# Patient Record
Sex: Female | Born: 1970 | Race: White | Hispanic: No | Marital: Married | Smoking: Former smoker
Health system: Southern US, Community
[De-identification: ages and names within clinical notes are randomized; demographics above are authoritative.]

## PROBLEM LIST (undated history)

## (undated) DIAGNOSIS — L0390 Cellulitis, unspecified: Secondary | ICD-10-CM

## (undated) DIAGNOSIS — K219 Gastro-esophageal reflux disease without esophagitis: Secondary | ICD-10-CM

## (undated) HISTORY — PX: TUBAL LIGATION: SHX77

## (undated) HISTORY — PX: ROTATOR CUFF REPAIR: SHX139

## (undated) HISTORY — PX: KNEE ARTHROSCOPY: SHX127

## (undated) HISTORY — PX: CHOLECYSTECTOMY: SHX55

## (undated) HISTORY — PX: ABLATION: SHX5711

---

## 1998-03-09 ENCOUNTER — Ambulatory Visit (HOSPITAL_COMMUNITY): Admission: RE | Admit: 1998-03-09 | Discharge: 1998-03-09 | Payer: Self-pay | Admitting: Family Medicine

## 1998-03-09 ENCOUNTER — Encounter: Payer: Self-pay | Admitting: Family Medicine

## 1998-06-27 ENCOUNTER — Emergency Department (HOSPITAL_COMMUNITY): Admission: EM | Admit: 1998-06-27 | Discharge: 1998-06-27 | Payer: Self-pay | Admitting: Emergency Medicine

## 1998-06-27 ENCOUNTER — Encounter: Payer: Self-pay | Admitting: Emergency Medicine

## 1998-08-10 ENCOUNTER — Other Ambulatory Visit: Admission: RE | Admit: 1998-08-10 | Discharge: 1998-08-10 | Payer: Self-pay | Admitting: Family Medicine

## 1999-07-09 ENCOUNTER — Inpatient Hospital Stay (HOSPITAL_COMMUNITY): Admission: EM | Admit: 1999-07-09 | Discharge: 1999-07-11 | Payer: Self-pay | Admitting: Emergency Medicine

## 1999-08-10 ENCOUNTER — Ambulatory Visit: Admission: RE | Admit: 1999-08-10 | Discharge: 1999-08-10 | Payer: Self-pay | Admitting: Family Medicine

## 1999-10-06 ENCOUNTER — Ambulatory Visit (HOSPITAL_COMMUNITY): Admission: RE | Admit: 1999-10-06 | Discharge: 1999-10-06 | Payer: Self-pay | Admitting: Orthopedic Surgery

## 1999-10-06 ENCOUNTER — Encounter: Payer: Self-pay | Admitting: Orthopedic Surgery

## 2000-09-19 ENCOUNTER — Ambulatory Visit (HOSPITAL_BASED_OUTPATIENT_CLINIC_OR_DEPARTMENT_OTHER): Admission: RE | Admit: 2000-09-19 | Discharge: 2000-09-19 | Payer: Self-pay | Admitting: Orthopaedic Surgery

## 2000-11-28 ENCOUNTER — Encounter: Payer: Self-pay | Admitting: Internal Medicine

## 2000-11-28 ENCOUNTER — Emergency Department (HOSPITAL_COMMUNITY): Admission: EM | Admit: 2000-11-28 | Discharge: 2000-11-28 | Payer: Self-pay | Admitting: Emergency Medicine

## 2001-01-15 ENCOUNTER — Encounter: Payer: Self-pay | Admitting: Gastroenterology

## 2001-01-15 ENCOUNTER — Ambulatory Visit (HOSPITAL_COMMUNITY): Admission: RE | Admit: 2001-01-15 | Discharge: 2001-01-15 | Payer: Self-pay | Admitting: Gastroenterology

## 2001-01-18 ENCOUNTER — Ambulatory Visit (HOSPITAL_COMMUNITY): Admission: RE | Admit: 2001-01-18 | Discharge: 2001-01-18 | Payer: Self-pay | Admitting: Gastroenterology

## 2001-03-19 ENCOUNTER — Encounter: Payer: Self-pay | Admitting: Surgery

## 2001-03-23 ENCOUNTER — Encounter (INDEPENDENT_AMBULATORY_CARE_PROVIDER_SITE_OTHER): Payer: Self-pay | Admitting: Specialist

## 2001-03-23 ENCOUNTER — Observation Stay (HOSPITAL_COMMUNITY): Admission: RE | Admit: 2001-03-23 | Discharge: 2001-03-24 | Payer: Self-pay | Admitting: Surgery

## 2001-03-23 ENCOUNTER — Encounter: Payer: Self-pay | Admitting: Surgery

## 2002-12-23 ENCOUNTER — Ambulatory Visit (HOSPITAL_COMMUNITY): Admission: RE | Admit: 2002-12-23 | Discharge: 2002-12-23 | Payer: Self-pay | Admitting: *Deleted

## 2003-08-12 ENCOUNTER — Emergency Department (HOSPITAL_COMMUNITY): Admission: EM | Admit: 2003-08-12 | Discharge: 2003-08-12 | Payer: Self-pay | Admitting: Family Medicine

## 2003-08-18 ENCOUNTER — Emergency Department (HOSPITAL_COMMUNITY): Admission: EM | Admit: 2003-08-18 | Discharge: 2003-08-18 | Payer: Self-pay | Admitting: Family Medicine

## 2003-12-22 ENCOUNTER — Emergency Department (HOSPITAL_COMMUNITY): Admission: EM | Admit: 2003-12-22 | Discharge: 2003-12-22 | Payer: Self-pay | Admitting: Family Medicine

## 2003-12-29 ENCOUNTER — Emergency Department (HOSPITAL_COMMUNITY): Admission: EM | Admit: 2003-12-29 | Discharge: 2003-12-29 | Payer: Self-pay | Admitting: *Deleted

## 2004-01-04 ENCOUNTER — Emergency Department (HOSPITAL_COMMUNITY): Admission: EM | Admit: 2004-01-04 | Discharge: 2004-01-05 | Payer: Self-pay | Admitting: Emergency Medicine

## 2004-02-11 ENCOUNTER — Inpatient Hospital Stay (HOSPITAL_COMMUNITY): Admission: EM | Admit: 2004-02-11 | Discharge: 2004-02-14 | Payer: Self-pay | Admitting: Emergency Medicine

## 2004-08-31 ENCOUNTER — Emergency Department (HOSPITAL_COMMUNITY): Admission: EM | Admit: 2004-08-31 | Discharge: 2004-08-31 | Payer: Self-pay | Admitting: Emergency Medicine

## 2005-07-07 ENCOUNTER — Ambulatory Visit (HOSPITAL_BASED_OUTPATIENT_CLINIC_OR_DEPARTMENT_OTHER): Admission: RE | Admit: 2005-07-07 | Discharge: 2005-07-07 | Payer: Self-pay | Admitting: Orthopedic Surgery

## 2005-09-05 ENCOUNTER — Encounter: Admission: RE | Admit: 2005-09-05 | Discharge: 2005-09-05 | Payer: Self-pay | Admitting: Family Medicine

## 2006-01-06 ENCOUNTER — Emergency Department (HOSPITAL_COMMUNITY): Admission: EM | Admit: 2006-01-06 | Discharge: 2006-01-06 | Payer: Self-pay | Admitting: Family Medicine

## 2006-08-20 ENCOUNTER — Inpatient Hospital Stay (HOSPITAL_COMMUNITY): Admission: EM | Admit: 2006-08-20 | Discharge: 2006-08-24 | Payer: Self-pay | Admitting: Emergency Medicine

## 2006-08-23 ENCOUNTER — Ambulatory Visit: Payer: Self-pay | Admitting: Infectious Diseases

## 2006-09-06 ENCOUNTER — Encounter: Admission: RE | Admit: 2006-09-06 | Discharge: 2006-09-15 | Payer: Self-pay | Admitting: Family Medicine

## 2006-10-23 ENCOUNTER — Inpatient Hospital Stay (HOSPITAL_COMMUNITY): Admission: EM | Admit: 2006-10-23 | Discharge: 2006-10-25 | Payer: Self-pay | Admitting: Emergency Medicine

## 2006-10-24 ENCOUNTER — Ambulatory Visit: Payer: Self-pay | Admitting: Vascular Surgery

## 2006-10-24 ENCOUNTER — Encounter (INDEPENDENT_AMBULATORY_CARE_PROVIDER_SITE_OTHER): Payer: Self-pay | Admitting: Internal Medicine

## 2006-12-28 ENCOUNTER — Ambulatory Visit: Payer: Self-pay | Admitting: Vascular Surgery

## 2006-12-28 ENCOUNTER — Inpatient Hospital Stay (HOSPITAL_COMMUNITY): Admission: EM | Admit: 2006-12-28 | Discharge: 2006-12-30 | Payer: Self-pay | Admitting: Emergency Medicine

## 2006-12-28 ENCOUNTER — Encounter (INDEPENDENT_AMBULATORY_CARE_PROVIDER_SITE_OTHER): Payer: Self-pay | Admitting: Internal Medicine

## 2007-03-29 ENCOUNTER — Emergency Department (HOSPITAL_COMMUNITY): Admission: EM | Admit: 2007-03-29 | Discharge: 2007-03-30 | Payer: Self-pay | Admitting: Family Medicine

## 2007-07-20 ENCOUNTER — Inpatient Hospital Stay (HOSPITAL_COMMUNITY): Admission: EM | Admit: 2007-07-20 | Discharge: 2007-07-22 | Payer: Self-pay | Admitting: Emergency Medicine

## 2007-07-20 ENCOUNTER — Ambulatory Visit: Payer: Self-pay | Admitting: Vascular Surgery

## 2007-07-20 ENCOUNTER — Encounter (INDEPENDENT_AMBULATORY_CARE_PROVIDER_SITE_OTHER): Payer: Self-pay | Admitting: Emergency Medicine

## 2008-12-29 ENCOUNTER — Inpatient Hospital Stay (HOSPITAL_COMMUNITY): Admission: EM | Admit: 2008-12-29 | Discharge: 2008-12-31 | Payer: Self-pay

## 2008-12-29 ENCOUNTER — Encounter: Payer: Self-pay | Admitting: Emergency Medicine

## 2008-12-29 ENCOUNTER — Ambulatory Visit: Payer: Self-pay | Admitting: Diagnostic Radiology

## 2009-07-07 ENCOUNTER — Emergency Department (HOSPITAL_COMMUNITY): Admission: EM | Admit: 2009-07-07 | Discharge: 2009-07-07 | Payer: Self-pay | Admitting: Emergency Medicine

## 2009-08-21 ENCOUNTER — Observation Stay (HOSPITAL_COMMUNITY): Admission: EM | Admit: 2009-08-21 | Discharge: 2009-08-21 | Payer: Self-pay | Admitting: Emergency Medicine

## 2009-08-23 ENCOUNTER — Emergency Department (HOSPITAL_BASED_OUTPATIENT_CLINIC_OR_DEPARTMENT_OTHER): Admission: EM | Admit: 2009-08-23 | Discharge: 2009-08-23 | Payer: Self-pay | Admitting: Emergency Medicine

## 2009-08-23 ENCOUNTER — Ambulatory Visit: Payer: Self-pay | Admitting: Diagnostic Radiology

## 2009-10-19 ENCOUNTER — Ambulatory Visit: Payer: Self-pay | Admitting: Surgery

## 2009-10-19 ENCOUNTER — Encounter (INDEPENDENT_AMBULATORY_CARE_PROVIDER_SITE_OTHER): Payer: Self-pay | Admitting: Emergency Medicine

## 2009-10-19 ENCOUNTER — Observation Stay (HOSPITAL_COMMUNITY): Admission: EM | Admit: 2009-10-19 | Discharge: 2009-10-19 | Payer: Self-pay | Admitting: Emergency Medicine

## 2010-01-29 ENCOUNTER — Emergency Department (HOSPITAL_COMMUNITY): Admission: EM | Admit: 2010-01-29 | Discharge: 2010-01-29 | Payer: Self-pay | Admitting: Emergency Medicine

## 2010-06-03 ENCOUNTER — Emergency Department (HOSPITAL_COMMUNITY): Payer: BC Managed Care – PPO

## 2010-06-03 ENCOUNTER — Emergency Department (HOSPITAL_COMMUNITY)
Admission: EM | Admit: 2010-06-03 | Discharge: 2010-06-03 | Disposition: A | Discharge status: Home / Self Care | Payer: BC Managed Care – PPO | Attending: Emergency Medicine | Admitting: Emergency Medicine

## 2010-06-03 DIAGNOSIS — R1033 Periumbilical pain: Secondary | ICD-10-CM | POA: Insufficient documentation

## 2010-06-03 DIAGNOSIS — E119 Type 2 diabetes mellitus without complications: Secondary | ICD-10-CM | POA: Insufficient documentation

## 2010-06-03 DIAGNOSIS — E039 Hypothyroidism, unspecified: Secondary | ICD-10-CM | POA: Insufficient documentation

## 2010-06-03 DIAGNOSIS — F17200 Nicotine dependence, unspecified, uncomplicated: Secondary | ICD-10-CM | POA: Insufficient documentation

## 2010-06-03 LAB — CBC
HCT: 37.8 % (ref 36.0–46.0)
Hemoglobin: 11.7 g/dL — ABNORMAL LOW (ref 12.0–15.0)
MCH: 24.5 pg — ABNORMAL LOW (ref 26.0–34.0)
MCHC: 31 g/dL (ref 30.0–36.0)
MCV: 79.2 fL (ref 78.0–100.0)
Platelets: 391 10*3/uL (ref 150–400)
RBC: 4.77 MIL/uL (ref 3.87–5.11)
RDW: 16 % — ABNORMAL HIGH (ref 11.5–15.5)
WBC: 11.7 10*3/uL — ABNORMAL HIGH (ref 4.0–10.5)

## 2010-06-03 LAB — URINALYSIS, ROUTINE W REFLEX MICROSCOPIC
Bilirubin Urine: NEGATIVE
Hgb urine dipstick: NEGATIVE
Ketones, ur: NEGATIVE mg/dL
Nitrite: NEGATIVE
Protein, ur: NEGATIVE mg/dL
Specific Gravity, Urine: 1.03 (ref 1.005–1.030)
Urine Glucose, Fasting: NEGATIVE mg/dL
Urobilinogen, UA: 1 mg/dL (ref 0.0–1.0)
pH: 6 (ref 5.0–8.0)

## 2010-06-03 LAB — DIFFERENTIAL
Basophils Absolute: 0 10*3/uL (ref 0.0–0.1)
Basophils Relative: 0 % (ref 0–1)
Eosinophils Absolute: 0.3 10*3/uL (ref 0.0–0.7)
Eosinophils Relative: 3 % (ref 0–5)
Lymphocytes Relative: 21 % (ref 12–46)
Lymphs Abs: 2.4 10*3/uL (ref 0.7–4.0)
Monocytes Absolute: 0.8 10*3/uL (ref 0.1–1.0)
Monocytes Relative: 7 % (ref 3–12)
Neutro Abs: 8.1 10*3/uL — ABNORMAL HIGH (ref 1.7–7.7)
Neutrophils Relative %: 69 % (ref 43–77)

## 2010-06-03 LAB — COMPREHENSIVE METABOLIC PANEL
ALT: 19 U/L (ref 0–35)
AST: 20 U/L (ref 0–37)
Albumin: 3.3 g/dL — ABNORMAL LOW (ref 3.5–5.2)
Alkaline Phosphatase: 84 U/L (ref 39–117)
BUN: 12 mg/dL (ref 6–23)
CO2: 26 mEq/L (ref 19–32)
Calcium: 8.9 mg/dL (ref 8.4–10.5)
Chloride: 105 mEq/L (ref 96–112)
Creatinine, Ser: 0.7 mg/dL (ref 0.4–1.2)
GFR calc Af Amer: >60 mL/min (ref >60)
GFR calc non Af Amer: >60 mL/min (ref >60)
Glucose, Bld: 122 mg/dL — ABNORMAL HIGH (ref 70–99)
Potassium: 4 mEq/L (ref 3.5–5.1)
Sodium: 137 mEq/L (ref 135–145)
Total Bilirubin: 0.4 mg/dL (ref 0.3–1.2)
Total Protein: 7.4 g/dL (ref 6.0–8.3)

## 2010-06-03 LAB — URINE MICROSCOPIC-ADD ON

## 2010-06-03 LAB — PREGNANCY, URINE: Preg Test, Ur: NEGATIVE

## 2010-06-03 LAB — LIPASE, BLOOD: Lipase: 37 U/L (ref 11–59)

## 2010-06-03 MED ORDER — IOHEXOL 300 MG/ML  SOLN: 100.0000 mL | Freq: Once | INTRAMUSCULAR | Status: DC | PRN

## 2010-07-04 LAB — URINALYSIS, ROUTINE W REFLEX MICROSCOPIC
Bilirubin Urine: NEGATIVE
Glucose, UA: NEGATIVE mg/dL
Hgb urine dipstick: NEGATIVE
Ketones, ur: 15 mg/dL — AB
Nitrite: NEGATIVE
Protein, ur: NEGATIVE mg/dL
Specific Gravity, Urine: 1.025 (ref 1.005–1.030)
Urobilinogen, UA: 0.2 mg/dL (ref 0.0–1.0)
pH: 5 (ref 5.0–8.0)

## 2010-07-04 LAB — COMPREHENSIVE METABOLIC PANEL
ALT: 21 U/L (ref 0–35)
AST: 23 U/L (ref 0–37)
Albumin: 3.2 g/dL — ABNORMAL LOW (ref 3.5–5.2)
Alkaline Phosphatase: 74 U/L (ref 39–117)
BUN: 8 mg/dL (ref 6–23)
CO2: 26 mEq/L (ref 19–32)
Calcium: 8.8 mg/dL (ref 8.4–10.5)
Chloride: 104 mEq/L (ref 96–112)
Creatinine, Ser: 0.63 mg/dL (ref 0.4–1.2)
GFR calc Af Amer: >60 mL/min (ref >60)
GFR calc non Af Amer: >60 mL/min (ref >60)
Glucose, Bld: 122 mg/dL — ABNORMAL HIGH (ref 70–99)
Potassium: 3.9 mEq/L (ref 3.5–5.1)
Sodium: 137 mEq/L (ref 135–145)
Total Bilirubin: 0.7 mg/dL (ref 0.3–1.2)
Total Protein: 7.5 g/dL (ref 6.0–8.3)

## 2010-07-04 LAB — DIFFERENTIAL
Basophils Absolute: 0 10*3/uL (ref 0.0–0.1)
Basophils Relative: 0 % (ref 0–1)
Eosinophils Absolute: 0.4 10*3/uL (ref 0.0–0.7)
Eosinophils Relative: 4 % (ref 0–5)
Lymphocytes Relative: 21 % (ref 12–46)
Lymphs Abs: 2.1 10*3/uL (ref 0.7–4.0)
Monocytes Absolute: 0.7 10*3/uL (ref 0.1–1.0)
Monocytes Relative: 7 % (ref 3–12)
Neutro Abs: 6.6 10*3/uL (ref 1.7–7.7)
Neutrophils Relative %: 67 % (ref 43–77)

## 2010-07-04 LAB — CBC
HCT: 35 % — ABNORMAL LOW (ref 36.0–46.0)
Hemoglobin: 11.6 g/dL — ABNORMAL LOW (ref 12.0–15.0)
MCH: 25.3 pg — ABNORMAL LOW (ref 26.0–34.0)
MCHC: 33 g/dL (ref 30.0–36.0)
MCV: 76.6 fL — ABNORMAL LOW (ref 78.0–100.0)
Platelets: 394 10*3/uL (ref 150–400)
RBC: 4.57 MIL/uL (ref 3.87–5.11)
RDW: 17.1 % — ABNORMAL HIGH (ref 11.5–15.5)
WBC: 9.8 10*3/uL (ref 4.0–10.5)

## 2010-07-06 LAB — DIFFERENTIAL
Basophils Absolute: 0.2 10*3/uL — ABNORMAL HIGH (ref 0.0–0.1)
Basophils Absolute: 0 10*3/uL (ref 0.0–0.1)
Basophils Relative: 1 % (ref 0–1)
Basophils Relative: 0 % (ref 0–1)
Eosinophils Absolute: 0.2 10*3/uL (ref 0.0–0.7)
Eosinophils Absolute: 0.1 10*3/uL (ref 0.0–0.7)
Eosinophils Relative: 1 % (ref 0–5)
Eosinophils Relative: 1 % (ref 0–5)
Lymphocytes Relative: 5 % — ABNORMAL LOW (ref 12–46)
Lymphocytes Relative: 13 % (ref 12–46)
Lymphs Abs: 1 10*3/uL (ref 0.7–4.0)
Lymphs Abs: 1.9 10*3/uL (ref 0.7–4.0)
Monocytes Absolute: 0.6 10*3/uL (ref 0.1–1.0)
Monocytes Absolute: 0.7 10*3/uL (ref 0.1–1.0)
Monocytes Relative: 3 % (ref 3–12)
Monocytes Relative: 5 % (ref 3–12)
Neutro Abs: 18 10*3/uL — ABNORMAL HIGH (ref 1.7–7.7)
Neutro Abs: 12.2 10*3/uL — ABNORMAL HIGH (ref 1.7–7.7)
Neutrophils Relative %: 90 % — ABNORMAL HIGH (ref 43–77)
Neutrophils Relative %: 82 % — ABNORMAL HIGH (ref 43–77)

## 2010-07-06 LAB — URINALYSIS, ROUTINE W REFLEX MICROSCOPIC
Bilirubin Urine: NEGATIVE
Bilirubin Urine: NEGATIVE
Glucose, UA: NEGATIVE mg/dL
Glucose, UA: NEGATIVE mg/dL
Hgb urine dipstick: NEGATIVE
Hgb urine dipstick: NEGATIVE
Ketones, ur: NEGATIVE mg/dL
Ketones, ur: NEGATIVE mg/dL
Nitrite: NEGATIVE
Nitrite: NEGATIVE
Protein, ur: NEGATIVE mg/dL
Protein, ur: NEGATIVE mg/dL
Specific Gravity, Urine: 1.015 (ref 1.005–1.030)
Specific Gravity, Urine: 1.027 (ref 1.005–1.030)
Urobilinogen, UA: 0.2 mg/dL (ref 0.0–1.0)
Urobilinogen, UA: 0.2 mg/dL (ref 0.0–1.0)
pH: 8 (ref 5.0–8.0)
pH: 5 (ref 5.0–8.0)

## 2010-07-06 LAB — COMPREHENSIVE METABOLIC PANEL
ALT: 20 U/L (ref 0–35)
AST: 21 U/L (ref 0–37)
Albumin: 3.2 g/dL — ABNORMAL LOW (ref 3.5–5.2)
Alkaline Phosphatase: 97 U/L (ref 39–117)
BUN: 10 mg/dL (ref 6–23)
CO2: 30 mEq/L (ref 19–32)
Calcium: 9.4 mg/dL (ref 8.4–10.5)
Chloride: 102 mEq/L (ref 96–112)
Creatinine, Ser: 0.72 mg/dL (ref 0.4–1.2)
GFR calc Af Amer: >60 mL/min (ref >60)
GFR calc non Af Amer: >60 mL/min (ref >60)
Glucose, Bld: 187 mg/dL — ABNORMAL HIGH (ref 70–99)
Potassium: 4 mEq/L (ref 3.5–5.1)
Sodium: 138 mEq/L (ref 135–145)
Total Bilirubin: 0.8 mg/dL (ref 0.3–1.2)
Total Protein: 7.4 g/dL (ref 6.0–8.3)

## 2010-07-06 LAB — BASIC METABOLIC PANEL
BUN: 7 mg/dL (ref 6–23)
CO2: 25 mEq/L (ref 19–32)
Calcium: 8.9 mg/dL (ref 8.4–10.5)
Chloride: 102 mEq/L (ref 96–112)
Creatinine, Ser: 0.7 mg/dL (ref 0.4–1.2)
GFR calc Af Amer: >60 mL/min (ref >60)
GFR calc non Af Amer: >60 mL/min (ref >60)
Glucose, Bld: 126 mg/dL — ABNORMAL HIGH (ref 70–99)
Potassium: 5.2 mEq/L — ABNORMAL HIGH (ref 3.5–5.1)
Sodium: 140 mEq/L (ref 135–145)

## 2010-07-06 LAB — CBC
HCT: 34.8 % — ABNORMAL LOW (ref 36.0–46.0)
HCT: 36.2 % (ref 36.0–46.0)
Hemoglobin: 11.4 g/dL — ABNORMAL LOW (ref 12.0–15.0)
Hemoglobin: 11.9 g/dL — ABNORMAL LOW (ref 12.0–15.0)
MCHC: 32.7 g/dL (ref 30.0–36.0)
MCHC: 32.9 g/dL (ref 30.0–36.0)
MCV: 75.4 fL — ABNORMAL LOW (ref 78.0–100.0)
MCV: 76.6 fL — ABNORMAL LOW (ref 78.0–100.0)
Platelets: 399 10*3/uL (ref 150–400)
Platelets: 404 10*3/uL — ABNORMAL HIGH (ref 150–400)
RBC: 4.62 MIL/uL (ref 3.87–5.11)
RBC: 4.73 MIL/uL (ref 3.87–5.11)
RDW: 15.3 % (ref 11.5–15.5)
RDW: 16.3 % — ABNORMAL HIGH (ref 11.5–15.5)
WBC: 20 10*3/uL — ABNORMAL HIGH (ref 4.0–10.5)
WBC: 14.9 10*3/uL — ABNORMAL HIGH (ref 4.0–10.5)

## 2010-07-06 LAB — PREGNANCY, URINE: Preg Test, Ur: NEGATIVE

## 2010-07-06 LAB — HEMOCCULT GUIAC POC 1CARD (OFFICE): Fecal Occult Bld: NEGATIVE

## 2010-07-06 LAB — POCT PREGNANCY, URINE: Preg Test, Ur: NEGATIVE

## 2010-07-06 LAB — LIPASE, BLOOD: Lipase: 29 U/L (ref 11–59)

## 2010-07-23 LAB — BASIC METABOLIC PANEL
BUN: 12 mg/dL (ref 6–23)
CO2: 30 mEq/L (ref 19–32)
Calcium: 9.6 mg/dL (ref 8.4–10.5)
Chloride: 105 mEq/L (ref 96–112)
Creatinine, Ser: 0.7 mg/dL (ref 0.4–1.2)
GFR calc Af Amer: >60 mL/min (ref >60)
GFR calc non Af Amer: >60 mL/min (ref >60)
Glucose, Bld: 107 mg/dL — ABNORMAL HIGH (ref 70–99)
Potassium: 4.4 mEq/L (ref 3.5–5.1)
Sodium: 143 mEq/L (ref 135–145)

## 2010-07-23 LAB — LIPID PANEL
Cholesterol: 144 mg/dL (ref 0–200)
HDL: 32 mg/dL — ABNORMAL LOW (ref >39)
LDL Cholesterol: 83 mg/dL (ref 0–99)
Total CHOL/HDL Ratio: 4.5 RATIO
Triglycerides: 147 mg/dL (ref <150)
VLDL: 29 mg/dL (ref 0–40)

## 2010-07-23 LAB — DIFFERENTIAL
Basophils Absolute: 0.4 10*3/uL — ABNORMAL HIGH (ref 0.0–0.1)
Basophils Relative: 4 % — ABNORMAL HIGH (ref 0–1)
Eosinophils Absolute: 0.3 10*3/uL (ref 0.0–0.7)
Eosinophils Relative: 3 % (ref 0–5)
Lymphocytes Relative: 26 % (ref 12–46)
Lymphs Abs: 2.5 10*3/uL (ref 0.7–4.0)
Monocytes Absolute: 0.8 10*3/uL (ref 0.1–1.0)
Monocytes Relative: 8 % (ref 3–12)
Neutro Abs: 5.9 10*3/uL (ref 1.7–7.7)
Neutrophils Relative %: 59 % (ref 43–77)

## 2010-07-23 LAB — COMPREHENSIVE METABOLIC PANEL
ALT: 22 U/L (ref 0–35)
AST: 26 U/L (ref 0–37)
Albumin: 2.7 g/dL — ABNORMAL LOW (ref 3.5–5.2)
Alkaline Phosphatase: 80 U/L (ref 39–117)
BUN: 7 mg/dL (ref 6–23)
CO2: 24 mEq/L (ref 19–32)
Calcium: 8.5 mg/dL (ref 8.4–10.5)
Chloride: 108 mEq/L (ref 96–112)
Creatinine, Ser: 0.69 mg/dL (ref 0.4–1.2)
GFR calc Af Amer: >60 mL/min (ref >60)
GFR calc non Af Amer: >60 mL/min (ref >60)
Glucose, Bld: 132 mg/dL — ABNORMAL HIGH (ref 70–99)
Potassium: 4.3 mEq/L (ref 3.5–5.1)
Sodium: 140 mEq/L (ref 135–145)
Total Bilirubin: 0.6 mg/dL (ref 0.3–1.2)
Total Protein: 6.1 g/dL (ref 6.0–8.3)

## 2010-07-23 LAB — GLUCOSE, CAPILLARY
Glucose-Capillary: 102 mg/dL — ABNORMAL HIGH (ref 70–99)
Glucose-Capillary: 104 mg/dL — ABNORMAL HIGH (ref 70–99)
Glucose-Capillary: 112 mg/dL — ABNORMAL HIGH (ref 70–99)
Glucose-Capillary: 121 mg/dL — ABNORMAL HIGH (ref 70–99)
Glucose-Capillary: 131 mg/dL — ABNORMAL HIGH (ref 70–99)
Glucose-Capillary: 139 mg/dL — ABNORMAL HIGH (ref 70–99)

## 2010-07-23 LAB — CULTURE, BLOOD (ROUTINE X 2)
Culture: NO GROWTH
Culture: NO GROWTH

## 2010-07-23 LAB — CBC
HCT: 31.9 % — ABNORMAL LOW (ref 36.0–46.0)
HCT: 36.9 % (ref 36.0–46.0)
Hemoglobin: 10.8 g/dL — ABNORMAL LOW (ref 12.0–15.0)
Hemoglobin: 12.1 g/dL (ref 12.0–15.0)
MCHC: 32.7 g/dL (ref 30.0–36.0)
MCHC: 33.9 g/dL (ref 30.0–36.0)
MCV: 77.7 fL — ABNORMAL LOW (ref 78.0–100.0)
MCV: 77.9 fL — ABNORMAL LOW (ref 78.0–100.0)
Platelets: 273 10*3/uL (ref 150–400)
Platelets: 417 10*3/uL — ABNORMAL HIGH (ref 150–400)
RBC: 4.1 MIL/uL (ref 3.87–5.11)
RBC: 4.76 MIL/uL (ref 3.87–5.11)
RDW: 15.1 % (ref 11.5–15.5)
RDW: 16 % — ABNORMAL HIGH (ref 11.5–15.5)
WBC: 8.6 10*3/uL (ref 4.0–10.5)
WBC: 9.9 10*3/uL (ref 4.0–10.5)

## 2010-07-23 LAB — HEMOGLOBIN A1C
Hgb A1c MFr Bld: 7.6 % — ABNORMAL HIGH (ref 4.6–6.1)
Mean Plasma Glucose: 171 mg/dL

## 2010-07-23 LAB — TSH: TSH: 3.455 u[IU]/mL (ref 0.350–4.500)

## 2010-08-09 ENCOUNTER — Observation Stay (HOSPITAL_COMMUNITY)
Admission: EM | Admit: 2010-08-09 | Discharge: 2010-08-10 | Disposition: A | Discharge status: Home / Self Care | Payer: BC Managed Care – PPO | Attending: Emergency Medicine | Admitting: Emergency Medicine

## 2010-08-09 DIAGNOSIS — L02419 Cutaneous abscess of limb, unspecified: Principal | ICD-10-CM | POA: Insufficient documentation

## 2010-08-09 DIAGNOSIS — L03119 Cellulitis of unspecified part of limb: Secondary | ICD-10-CM | POA: Insufficient documentation

## 2010-08-09 DIAGNOSIS — M79609 Pain in unspecified limb: Secondary | ICD-10-CM | POA: Insufficient documentation

## 2010-08-31 NOTE — Discharge Summary (Signed)
[name], [name]            ACCOUNT NO.:  [id number]   MEDICAL RECORD NO.:  [id number]          PATIENT TYPE:  INP   LOCATION:  1532                         FACILITY:  [facility]   PHYSICIAN:  [name], M.D.   DATE OF BIRTH:  May 24, 1970   DATE OF ADMISSION:  08/19/2006  DATE OF DISCHARGE:  08/24/2006                               DISCHARGE SUMMARY   PRIMARY CARE PHYSICIAN:  [name], M.D.   DISCHARGE DIAGNOSES:  1. Cellulitis, likely due to streptococcal infection.  2. Morbid obesity.  3. Microcytic anemia.  4. New onset diabetes mellitus   DISCHARGE MEDICATIONS:  1. Keflex 500 mg b.i.d. x1 week.  2. Metformin 500 mg b.i.d.  3  Multivitamin with iron daily.  4  Prevacid 30 mg daily.  5  Hibiclens showers every month.   CONSULTATIONS:  Dr. Rockey Peterson. [name] of infectious disease medicine.   BRIEF ADMISSION HISTORY OF PRESENT ILLNESS:  The patient is a 40-year-  old female who presented to the hospital with a chief complaint of  redness and swelling in the right thigh area.  She also reported having  fever, chills, and headaches.  She was admitted for treatment of acute  cellulitis of the right thigh area.  For full details of the HPI, please  see the dictated report done by Dr. Lovell Peterson.   PROCEDURES AND DIAGNOSTIC STUDIES:  1. CT scanning of the right lower extremity on Aug 22, 2006, showed      superficial cellulitis type changes, particularly noted in the      calf.  There was no sign of deep space infection or drainable      collection.  2. Chest x-ray on Aug 23, 2006, status post placement of a right      peripherally inserted central catheter showed the catheter tip in      the lower SVC with no pneumothorax.   DISCHARGE LABORATORY VALUES:  White blood cell count was 10.8,  hemoglobin 10, hematocrit 30.4, platelets 446.  Sodium was 139,  potassium 3.9, chloride 106, bicarb 28, BUN 5, creatinine 0.63, glucose  125.   HOSPITAL COURSE:  1. Cellulitis.   The patient was admitted with a presumptive diagnosis      of cellulitis and empirically put on IV antibiotics which initially      included vancomycin and ciprofloxacin.  On hospital day #1, the      Cipro was discontinued and she was continued on vancomycin.  CT      scanning was obtained to rule out deep tissue abscess which was      negative.  The patient was seen in consultation with the infectious      disease specialist to clarify the appropriate antibiotic choice.      It was felt the patient's cellulitis was likely due to      Streptococcus and Dr. Roxan Peterson recommended discontinuation of      vancomycin and clindamycin and initiation of treatment with      Rocephin, followed by transition over to p.o. Keflex.  At this      time, the marked area  of cellulitis on her leg is beginning to      subside.  She will continue to have significant erythema given her      infecting organism and her obesity which limits blood supply to the      affected area.  She should continue on Keflex for 1 week and follow      up with her primary care physician for further evaluation.  2. Morbid obesity.  The patient is morbidly obese.  She did have      instruction with the dieticians with regard to this as well as her      new onset of diabetes.  She is encouraged to maintain a weight      reduction diet and to discuss with her primary care physician,      consideration for referral to a bariatric specialist for      consideration of gastric bypass surgery.  3. Microcytic anemia.  The patient is a menstruating female and her      microcytic anemia is most likely due to iron deficiency.  She is      not symptomatic and her anemia is mild.  She can continue on her      multivitamin with iron supplement.  4. New onset diabetes.  The patient does have evidence of type 2      diabetes, given her morbid obesity and elevated fasting blood      glucose levels.  A hemoglobin A1c was checked and was elevated  at      7.5, supporting this diagnosis.  The patient was put on metformin      and sliding scale insulin to address her hyperglycemia.  She was      additionally given written diabetic educational materials and was      seen by the diabetes coordinator for formal initiation of diabetic      teaching.  She will be set up with outpatient diabetes education,      and she will need close followup with [name] for ongoing      management.   DISPOSITION:  The patient is stable for discharge home.   She instructed to initially wash with Hibiclens weekly until her  erythema resolves.  This area may blister up and this was a explained to  her.   She should follow up [name] early next week.      [name], M.D.  Electronically Signed     CR/MEDQ  D:  08/24/2006  T:  08/24/2006  Job:  [id number]   cc:   [name], M.D.  Fax: [phone]
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
## 2010-08-31 NOTE — H&P (Signed)
[name], [name]            ACCOUNT NO.:  [id number]   MEDICAL RECORD NO.:  [id number]          PATIENT TYPE:  INP   LOCATION:  1438                         FACILITY:  [facility]   PHYSICIAN:  Della Goo, M.D. DATE OF BIRTH:  [birth year]   DATE OF ADMISSION:  10/22/2006  DATE OF DISCHARGE:                              HISTORY & PHYSICAL   PRIMARY CARE PHYSICIAN:  Tammy R. Collins Scotland, M.D.   CHIEF COMPLAINT:  Redness and swelling right lower leg.   HISTORY OF PRESENT ILLNESS:  This is a 40 year old female presenting to  the emergency department with complaints of worsening redness, swelling  of the right lower leg.  She reports this redness and swelling began  during the past 24 hours.  She reports this area as painful and warm to  touch.  She reports cutting her foot a week ago, and she reports not  paying much attention to thisbut, she does believe this is the initial  site of infection.  The patient reports having headache and fever and  chills currently.   The patient was last hospitalized for similar reasons.  Cellulitis of  the right lower extremity from Aug 19, 2006 until Aug 23, 2001.   PAST MEDICAL HISTORY:  1. Morbid obesity.  2. Type 2 diabetes mellitus.  3. Anemia.  4. Arthritis.   PAST SURGICAL HISTORY:  Status post cholecystectomy and status post  arthroscopic surgery of the right knee.   MEDICATIONS INCLUDE:  1. Omeprazole 20 mg one p.o. daily.  2. Metformin 500 mg one p.o. b.i.d.  3. Multivitamin 1 p.o. daily   ALLERGIES:  No known drug allergies.   SOCIAL HISTORY:  The patient works as a Surveyor, mining, nonsmoker,  nondrinker.   FAMILY HISTORY:  Noncontributory.   REVIEW OF SYSTEMS:  Pertinent for mentioned above.   PHYSICAL EXAMINATION FINDINGS:  GENERAL:  Morbidly obese 40 year old  female in discomfort, but no acute distress.  VITAL SIGNS:  Temperature 98.5, blood pressure 117/52, heart rate 117,  respirations 16, O2 saturation is 97% on room  air.  HEENT: Normocephalic, atraumatic.  Pupils equally round reacted to  light.  Extraocular muscles are intact.  Funduscopic benign.  There is  no scleral icterus.  Oropharynx is clear.  NECK:  Supple full range of motion.  No thyromegaly, adenopathy, or  jugular venous distension.  CARDIOVASCULAR:  Tachycardiac rate and rhythm.  No murmurs, gallops or  rubs.  LUNGS:  Clear to auscultation bilaterally.  ABDOMEN:  Positive bowel sounds, soft, nontender, nondistended.  EXTREMITIES:  Positive erythema of the right lower extremity, posterior  aspect of the calf along with the posterior aspect of the thigh.  The  area is warm and painful to palpation.  NEUROLOGIC:  Examination  nonfocal.   LABORATORY STUDIES:  CBC with a white blood cell count of 13.1,  hemoglobin 12.1, hematocrit 36.2, MCV 74.0.  Platelets 435, neutrophils  75% lymphocytes 16%.  Sodium 137, potassium 4.6, chloride 104, CO2 24,  BUN 8, creatinine 0.69, glucose 114.  X-ray studies performed of the  right lower extremity, the tibia and fibula, along with the right  foot  were negative for bony changes; however, did reveal marked soft tissue  swelling, no bony changes suggestive of osteomyelitis, and no fractures.   ASSESSMENT:  A 40 year old female being admitted with  1. Cellulitis of the right lower extremity.  2. Type 2 diabetes mellitus.  3. Arthritis.  4. Morbid obesity.   PLAN:  The patient will be admitted for IV antibiotic therapy; and will  be started on empiric antibiotic therapy of vancomycin and Cipro.  Antibiotic therapy will be further adjusted pending culture results  which have been sent.  The patient will continue on her regular  medications except metformin has been placed on hold for now. The  patient will be placed on GI and DVT prophylaxis and p.r.n. medications  of tylenol and ibuprofen have been ordered for fever.  The patient will  also be placed on sliding scale insulin coverage as  needed.      Della Goo, M.D.  Electronically Signed     HJ/MEDQ  D:  10/23/2006  T:  10/23/2006  Job:  [id number]

## 2010-08-31 NOTE — Discharge Summary (Signed)
[name], Peterson            ACCOUNT NO.:  [id number]   MEDICAL RECORD NO.:  [id number]          PATIENT TYPE:  INP   LOCATION:  1438                         [facility]ACILITY:  [facility]   PHYSICIAN:  Elliot Cousin, M.D.    DATE OF BIRTH:  1970/10/13   DATE OF ADMISSION:  10/22/2006  DATE OF DISCHARGE:  10/25/2006                               DISCHARGE SUMMARY   DISCHARGE DIAGNOSES:  1. Right lower extremity cellulitis.  2. Rule out obstructive sleep apnea.  3. Type 2 diabetes mellitus.   DISCHARGE MEDICATIONS:  1. Keflex 500 mg t.i.d. for 1 week.  2. Bactrim DS one tablet b.i.d. for 1 week.  3. Metformin 500 mg b.i.d.  4. Omeprazole 20 mg daily.  5. Multivitamin once daily.   DISCHARGE DISPOSITION:  The patient is currently stable and in improved  condition.  She will be discharged to home today on October 25, 2006.  She  was advised to follow up with her primary care physician, Dr. Collins Peterson, in  7-10 days.   CONSULTATIONS:  None.   PROCEDURE PERFORMED:  1. Right lower extremity venous Doppler study performed on October 24, 2006.  The results were negative for DVT.  2. Pulmonary function tests performed on October 23, 2006.  The      computerized interpretation revealed minimal obstructive lung      defect.  The airway obstruction is confirmed by the decrease in      flow rate at peak flow and flow at 25% and 50% of the flow volume      curve.  There is a mild decrease in diffusing capacity.  FEF25-75      changed by 36%.  Mild response to bronchodilator.   HISTORY OF PRESENT ILLNESS:  The patient is a 40 year old woman with a  past medical history significant for right lower extremity cellulitis in  May 2008, morbid obesity, and type 2 diabetes mellitus.  She presented  to the emergency department on October 22, 2006, with a chief complaint of  redness and swelling of the right leg.  The patient was subsequently  admitted for further evaluation and management.   For additional details,  please see the dictated history and physical.   HOSPITAL COURSE:  Problem 1.  RIGHT LOWER EXTREMITY CELLULITIS:  The patient was started  on antibiotic treatment with intravenous vancomycin and ciprofloxacin.  Symptomatic and supportive care were given.  She was started on  prophylactic Lovenox.  A venous Doppler study was ordered to rule out  DVT.  It was negative for DVT.  The patient received approximately 48  hours of intravenous antibiotics.  She was subsequently changed to oral  Keflex and Bactrim.  As of today, the extent of the cellulitis is  significantly improved.  The patient does have some residual erythema;  however, the swelling, erythema, and tenderness have subsided.  The  patient is currently afebrile.  Her white blood cell count, which was  13.1 on admission, is currently 8.0.  She will be discharged to home on  seven more days of Keflex and Bactrim.  Problem 2.  RULE OUT OBSTRUCTIVE SLEEP APNEA:  One of the nurses noticed  that the patient had some episodes of apnea while sleeping.  Pulmonary  function tests were ordered and revealed a mild obstructive pattern.  An  ABG was ordered as well and revealed a pH of 7.4, pCO2 of 35, and a pO2  of 95.8.  The patient was informed of the observations.  The patient  actually noted that her mother has obstructive sleep apnea.  She was  advised to discuss the findings with her primary care physician, Dr.  Collins Peterson.  An outpatient sleep study was recommended by the dictating  physician.  Setting up the appointment will be deferred to Dr. Collins Peterson.  The patient was advised to lose weight.   Problem 3.  TYPE 2 DIABETES MELLITUS:  The patient was maintained on  metformin during the hospital course.  Her hemoglobin A1c was measured  to be 7.1.      Elliot Cousin, M.D.  Electronically Signed     DF/MEDQ  D:  10/25/2006  T:  10/25/2006  Job:  [id number]   cc:   [name], M.D.  Fax: [phone]

## 2010-08-31 NOTE — Discharge Summary (Signed)
[name], [name]            ACCOUNT NO.:  [id number]   MEDICAL RECORD NO.:  [id number]          PATIENT TYPE:  INP   LOCATION:  5017                         FACILITY:  [facility]   PHYSICIAN:  Elliot Cousin, M.D.    DATE OF BIRTH:  11-09-1970   DATE OF ADMISSION:  07/19/2007  DATE OF DISCHARGE:  07/22/2007                               DISCHARGE SUMMARY   DISCHARGE DIAGNOSES:  1. Right lower extremity cellulitis.  2. Prediabetes.  3. Morbid obesity.  4. Mild normocytic anemia.   DISCHARGE MEDICATIONS:  1. Augmentin 875 mg b.i.d. for 7 more days.  2. Metformin 500 mg b.i.d.  3. Ferrous sulfate 1 tablet daily.  4. Omeprazole 40 mg daily.   DISCHARGE DISPOSITION:  The patient is being discharged to home in  improved and stable condition.  She was advised to follow up with her  primary care physician Dr. Collins Scotland in 5-7 days.   CONSULTATIONS:  None.   PROCEDURE PERFORMED:  Bilateral lower extremity venous Doppler study.  Performed on July 20, 2007.  The results revealed no obvious evidence of  DVT, SVT, or Baker cyst (study limited because of body habitus).   HISTORY OF PRESENT ILLNESS:  The patient is a 40 year old woman with the  past medical history significant for prediabetes, recurrent cellulitis,  and morbid obesity.  She presented to the emergency department on July 19, 2007 with a chief complaint of right lower extremity redness, pain,  and swelling.  She did have subjective fever and chills at home.  When  she was evaluated in the emergency department, she was noted to be  hemodynamically stable and afebrile.  Her white blood cell count was  marginally elevated at 10.7.  The patient was admitted for further  evaluation and management.   For additional details please see the dictated history and physical.   HOSPITAL COURSE:  1. RECURRENT RIGHT LOWER EXTREMITY CELLULITIS.  The  patient was      started on empiric antibiotic treatment with Zosyn and vancomycin.      A  venous Doppler study was performed, and it revealed no obvious      evidence of DVT.  Over the course of the hospitalization, the Zosyn      was discontinued; however, the vancomycin was continued.  The      extent of the cellulitis, edema, and tenderness subsided.  Her      white blood cell count improved to 8.9 prior to hospital discharge.      Blood cultures which had been ordered at the time of the initial      hospital assessment have remained negative.  The patient has also      remained afebrile during the entire hospital course.  She will be      discharged home on 7 more days of Augmentin b.i.d.  Prior to the      hospital discharge, the patient was advised to keep her right leg      elevated when she is sitting or in a supine position.   1. PREDIABETES.  The patient says that she was tested  with a glucose      tolerance test several months ago by her primary care physician.      She was told that she did not have diabetes.  However, she was      prescribed metformin for weight loss and to prevent full-blown      diabetes.  She was maintained on metformin b.i.d. during the      hospital course.  Her hemoglobin A1c was 6.9.   1. ANEMIA.  The patient is treated chronically with iron      supplementation.  Her hemoglobin during the hospital course ranged      from 10.5-11.8.  She is a menstruating female.      Elliot Cousin, M.D.  Electronically Signed     DF/MEDQ  D:  07/22/2007  T:  07/23/2007  Job:  [id number]   cc:   Tammy R. Collins Scotland, M.D.

## 2010-08-31 NOTE — H&P (Signed)
[name], [name]            ACCOUNT NO.:  [id number]   MEDICAL RECORD NO.:  [id number]          PATIENT TYPE:  INP   LOCATION:  5017                         FACILITY:  [facility]   PHYSICIAN:  Eduard Clos, MDDATE OF BIRTH:  05-May-1970   DATE OF ADMISSION:  07/19/2007  DATE OF DISCHARGE:                              HISTORY & PHYSICAL   CHIEF COMPLAINT:  Right lower extremity erythema and pain.   HISTORY OF PRESENT ILLNESS:  A 40 year old female with a history of  morbid obesity, history of diabetes mellitus type 2, presents to the ER  complaining of increasing erythema and swelling in the right lower  extremity along with some pain.  The patient noticed the erythema and  swelling yesterday evening.  The patient did have an episode of fever  and chills.  She did have multiple episodes of cellulitis in lower  extremity, and she was treated with IV antibiotics.  The patient is  being admitted for further workup for cellulitis of her lower extremity.  The patient denies any chest pain, shortness of breath, palpitations,  nausea, vomiting, or any dysuria or discharges.   PAST MEDICAL HISTORY:  1. GERD.  2. Morbid obesity.  3. Diabetes mellitus type 2, though the patient states she was told      she was not diabetic.   PAST SURGICAL HISTORY:  1. Cholecystectomy.  2. Left wrist surgery.  3. Right knee arthroscopic surgery.   MEDICATIONS PRIOR TO ADMISSION:  The patient states now she only takes  Prilosec for gastritis.   ALLERGIES:  No known drug allergies.   FAMILY HISTORY:  Significant for breast cancer.  The patient has been  advised to have a breast cancer screening.   SOCIAL HISTORY:  The patient denies smoking cigarettes, drinking alcohol  or using any illegal drugs.   REVIEW OF SYSTEMS:  As per history of present illness.  Nothing else  significant.   PHYSICAL EXAMINATION:  GENERAL:  The patient was examined at bedside, in  no acute distress.  VITAL SIGNS:   Blood pressure 120/70, pulse 102 per minute, temperature  97.2, respirations 18 per minute, O2 saturation 97% on room air.  CHEST:  Bilateral air entry present.  No rhonchi.  No crepitation.  HEART:  S1, S2 heard.  ABDOMEN:  Soft, nontender, bowel sounds heard.  No guarding or rigidity.  CNS:  Alert and oriented to time, place and person.  Moves upper and  lower extremities 5/5.  Peripheral pulses felt.  There is erythema and  blistering in the right lower extremity extending from her ankle up to  her mid calf.  Peripheral pulses are felt well.   LABORATORY DATA:  CBC:  WBC 10.7, hemoglobin 12.6, hematocrit 37,  platelets 420, neutrophils 59%.  B-met:  Sodium 138, potassium 3.8,  chloride 103, glucose 111, BUN 14, creatinine 0.8.   ASSESSMENT:  1. Right lower extremity cellulitis.  2. Diabetes mellitus type 2.  3. Morbid obesity.   PLAN:  Return the patient to medical floor, start on empiric antibiotic  including vancomycin and Zosyn.  Obtain blood cultures.  Will obtain a  Doppler of lower extremity to rule out any DVT.  Further recommendation  as the patient's condition evolves.      Eduard Clos, MD  Electronically Signed     ANK/MEDQ  D:  07/20/2007  T:  07/20/2007  Job:  [id number]

## 2010-09-03 NOTE — H&P (Signed)
[name], [name]            ACCOUNT NO.:  [id number]   MEDICAL RECORD NO.:  [id number]          PATIENT TYPE:  INP   LOCATION:  5038                         FACILITY:  [facility]   PHYSICIAN:  Hollice Espy, M.D.DATE OF BIRTH:  1971-02-16   DATE OF ADMISSION:  02/11/2004  DATE OF DISCHARGE:                                HISTORY & PHYSICAL   ATTENDING PHYSICIAN:  Hollice Espy, M.D.   PRIMARY CARE PHYSICIAN:  The patient follows up at the urgent care center,  but she also sees Dr. Ysidro Evert. [place] for podiatry.   CHIEF COMPLAINT:  Foot pain and infection.   This is a 40 year old white female with a past medical history of obesity as  well as the previous cellulitis of the right foot secondary to toenail  infection.  The patient has been suffering from an ingrown toenail on her  first toe of her right foot for the past several weeks.  She underwent  partial toenail removal on Monday, but she has noticed some mild erythema  surrounding her toe starting on Saturday.  She continued to complain of some  burning and pain and found some slight problems with ambulation.  She is  noting today that her cellulitis had advanced to involving the dorsum aspect  of the majority of her right foot as well as had spread up into the lower  portion of her right leg up to right above the malleolar area.  It was  slightly tender and again she had difficulty ambulating.  From previous  experience, she likely suspected this was a cellulitis and contacted Dr.  Charlsie Merles her podiatrist who advised her to come into the emergency room.  The  patient came in for further evaluation.  Her labs were drawn, and the  patient was noted to have a white count of 24.1.  The rest of her lab work  was somewhat unremarkable; however, blood cultures were drawn and they are  currently pending.  The patient states that she feels some complaints of  some right foot discomfort but better than she had in the last few  days.  She denies any other complaints such as headaches, visual changes,  dysphagia, chest pain, palpitations, shortness of breath, wheeze, or a  cough.  She denies any abdominal pain, hematuria, dysuria, constipation, or  diarrhea.  She denies any problems with her upper extremities or left lower  extremity.   PAST MEDICAL HISTORY:  1.  Previous history of cellulitis of the same right foot secondary to an      ingrown toenail.  2.  She is status post a partial  nail removal a few days ago for this      episode.  3.  She suffers from morbid obesity.   MEDICATIONS:  She has been taking some p.r.n. Tylenol.   ALLERGIES:  She has no known drug allergies.   SOCIAL HISTORY:  She denies any tobacco, alcohol, or drug use.   FAMILY HISTORY:  Diabetes.   PHYSICAL EXAMINATION:  VITAL SIGNS:  On admission, temp 99.6 then up to  100.3, blood pressure 95/59, improve with  IV fluids to 121/43, heart rate is  132 now down to 122.  When I examined her she was under 1 00.  O2 sat 96% on  room air.  GENERAL:  She is alert and oriented  x 3.  No apparent distress.  HEENT:  Normocephalic, atraumatic.  She has very narrow airway.  Mucous  membranes are moist.  She has no carotid bruits.  HEART:  Regular rate and rhythm.  S1 S2.  LUNGS:  Clear to auscultation bilaterally.  ABDOMEN:  Soft, nontender, nondistended.  Positive bowel sounds.  She is  obese.  EXTREMITIES:  Show 1+ pitting edema.  Her right foot starting at about the  malleolar region in the dorsum and anterior aspect starting on her leg  extending down to the dorsum of her foot as well as her first great toe  shows evidence of erythema.  This is without blanching and it is mildly  tender.  It is warm and she has good 2+ pulses.  She has no loss of  sensation and no loss of motion.   LAB WORK:  The patient's white count is 24.1, H&H 11.6 and 34.1, MCV of 77,  platelet count of 371.  Creatinine is 0.9, sodium 136, potassium 3.6,   chloride 102, bicarb 23, BUN 9, creatinine 0.9, glucose of 127.   ASSESSMENT/PLAN:  1.  Cellulitis of the right foot with lower portion of right leg treated      with intravenous Ancef.  If improving we can change to oral Keflex the      next few days.  Plan to discharge home.  Blood cultures are pending.      She has had a cellulitis secondary to an infection from an ingrown      toenail.  2.  Obesity.      Send   SKK/MEDQ  D:  02/11/2004  T:  02/11/2004  Job:  [id number]   cc:   Ysidro Evert. [place], D.P.M.  [address] [place]  [place] [address]  Fax: [phone]

## 2010-09-03 NOTE — Op Note (Signed)
[place]  Patient:    [name], [name]                     MRN: [id number] Proc. Date: 09/19/00 Attending:  Lubertha Basque. Jerl Santos, M.D.                           Operative Report  PREOPERATIVE DIAGNOSIS:  Left wrist de Quervains.  POSTOPERATIVE DIAGNOSIS:  Left wrist de Quervains.  PROCEDURE:  Left wrist first dorsal compartment release.  ANESTHESIA:  Bier block and MAC.  ATTENDING SURGEON:  Lubertha Basque. Jerl Santos, M.D.  ASSISTANT:  Prince Rome, P.A.  INDICATIONS:  The patient is a 40 year old woman with a long history of left wrist pain.  She has failed splinting, oral anti-inflammatories and injections x 2, each of which afforded her transient relief.  She was then left with pain over the first dorsal compartment.  She is offered operative release at this point.   The procedure was discussed with the patient and informed operative consent was obtained after discussion of possible complications of, reaction to anesthesia, infection, and radial sensory nerve palsy.  DESCRIPTION OF PROCEDURE:  The patient was taken to the operating suite, where a Bier block anesthetic was applied without difficulty.  Some supplementary lidocaine was applied along with MAC.  She was positioned supine and prepped and draped in normal sterile fashion.  After administration of preoperative IV antibiotics, a small longitudinal incision was made in the area of the radial styloid.  Dissection was carried down to the first dorsal compartment.  The APL and EPB tendon sheaths were released.  There was a single tendon involved in each space.  Once ______ had been accomplished, the tendons moved freely without any compression.  The wound was irrigated, followed by closure of skin with nylon.  Adaptic was placed on the wound, followed by dry gauze and a loose Ace wrap.  The tourniquet was deflated and the hand became pink and warm immediately.  Estimated blood loss,  intraoperative fluids, as well as, accurate tourniquet time can be obtained from anesthesia records.  DISPOSITION:  The patient was taken to recovery room in stable condition.  PLANS:  Were for her to go home same day and to follow up in the office in less than a week.  I will contact her by phone tonight. DD:  09/19/00 TD:  09/19/00 Job: [id number]

## 2010-09-03 NOTE — Discharge Summary (Signed)
[place]  Patient:    [name], [name]                     MRN: [id number] Adm. Date:  [id number] Disc. Date: 07/11/99 Attending:  Lenora Boys                           Discharge Summary  DISCHARGE DIAGNOSIS:  Cellulitis of the right foot.  HISTORY OF PRESENT ILLNESS:  A 40 year old white lady who several days prior to admission developed redness that seemed to be initiated from a blister or ingrown toenail on the right big toe.  She was seen in the emergency room and felt that she needed to be admitted as a result of the extensive right leg cellulitis.  HOSPITAL COURSE:  She was given Ancef IV.  She had very good results and basically was back to normal except for some minimal redness at the right medial thigh on the day of discharge.  The warmth has basically gone away as well.  LABORATORY:  She had an initial white count of 12.6, hemoglobin of 12.2, hematocrit of 36.7.  Blood cultures were pending but were negative so far. She had no other blood work done.  DISPOSITION:  Patient to be seen at [facility] by Dr. Katrinka Blazing in two days.  The patient will call for the appointment.  DISCHARGE MEDICATIONS:  Keflex 500 mg one tablet twice a day.  ALLERGIES:  No known drug allergies.  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 Merri Brunette.  CONDITION:  Significantly improved and if she is not continuing to get better she will see [place] back in the office sooner. DD:  07/11/99 TD:  07/11/99 Job: [id number]

## 2010-09-03 NOTE — Op Note (Signed)
[place]. [facility]  Patient:    [name], [name]                     MRN: [id number] Proc. Date: 07/08/99 Adm. Date:  [id number] Attending:  Lenora Boys CC:         Dario Guardian, M.D.                           Operative Report  DATE OF BIRTH:  04-17-1971  PRIMARY CARE PHYSICIAN:  Dario Guardian, M.D.  PROBLEM:  Cellulitis.  OBJECTIVE:  The patient is a 40 year old, divorced white female, began to have swelling of her right leg on Sunday, March 18.  On March 22 she noted a red streak starting down near the right great toe which has extended proximally today. She has pain and soreness in the area of redness, has had low-grade fever, with significant sense of heat in the leg.  The patient apparently cut on an ingrown  toenail approximately a month ago.  PAST MEDICAL HISTORY:  The patient is gravida 1, para 1, had gestational diabetes and delivered by C-section in 1994.  She has not had other hospitalizations or surgery and has not had any diabetic diagnosis subsequent to that pregnancy. She has had some problems with tendonitis of her left wrist for which Dr. Merlyn Lot saw her three weeks ago and instilled Cortizone injection.  CURRENT MEDICATIONS:  Celebrex 200 mg q.d. x 3 weeks.  ALLERGIES:  None.  FAMILY HISTORY:  Mother has hypertension and recently diagnosed with PMR. Father is healthy.  One brother.  One sister, both living and healthy.  No family history cancer, diabetes, liver, or heart problems.  SOCIAL HISTORY:  The patient is divorced, drives a school bus.  She is not sexually active.  Denies any tobacco use or recreational drug use.  She does drink an occasional wine cooler.  REVIEW OF SYSTEMS:  Negative for headaches, problems with vision, hearing or dizziness but does have some tinnitus.  Has had a cough from the fall, probably due to allergies, which is currently resolved.  CARDIOVASCULAR review of  systems otherwise negative.  GASTROINTESTINAL review of systems negative. GENITOURINARY: Negative other than occasional vaginal discharge without vaginal itching. MUSCULOSKELETAL:  Reveals no pains other than the aforementioned right leg pain and left wrist tendonitis.  PSYCHIATRIC:  Denies any anxiety, depression or psychiatric diagnoses.  PHYSICAL EXAMINATION:  VITAL SIGNS:  Temperature 98.0, pulse 114, respirations 24, BP 131/53.  GENERAL:  The patient is in no apparent distress.  She is moderately to severely obese.  HEENT:  ENT:  Normal.  Eyes show PERL, EOMI.  Fundi benign.  NECK:  Supple.  No thyromegaly.  CHEST:  Clear.  HEART:  Heart rate regular.  No murmur, rub or gallop.  ABDOMEN:  Soft, obese.  No hepatosplenomegaly or masses.  VAGINAL/RECTAL:  Examinations were deferred.  EXTREMITIES:  Right leg shows significant redness, streaking from the dorsum of the great toe up to mid dorsomedial thigh.  There is no lymphadenopathy in the groin. No involvement in the left leg.  Right leg is hot, swollen and tender.  Skin is  still healing at the ingrown nail site, right great toenail.  LABORATORY DATA:  White count elevated at 12.6, hemoglobin normal 12.2.  Two blood cultures drawn in the ER are pending.  CBG done on the floor was 92.  ASSESSMENT AND PLAN: 1.  Cellulitis, right leg with probable site of entry the ingrown nail, right great    toe.  Will treat this with Ancef, elevation, K pad and expect that she    may be converted to p.o. medications within a couple of days. 2. History of gestational diabetes.  Fasting sugar this evening is 92.  Will check    a postprandial sugar to be sure that she does not have glucose intolerance    complicating her recovery from cellulitis. DD:  07/09/99 TD:  07/10/99 Job: [id number]

## 2010-09-03 NOTE — H&P (Signed)
NAMECONSTANZA, MINCY            ACCOUNT NO.:  [id number]   MEDICAL RECORD NO.:  [id number]          PATIENT TYPE:  INP   LOCATION:  0101                         [facility]ACILITY:  [facility]   PHYSICIAN:  Della Goo, M.D. DATE OF BIRTH:  03-01-1971   DATE OF ADMISSION:  08/19/2006  DATE OF DISCHARGE:                              HISTORY & PHYSICAL   PRIMARY CARE PHYSICIAN:  This is an unassigned patient.   CHIEF COMPLAINT:  Redness and swelling, right lower leg.   HISTORY OF PRESENT ILLNESS:  This is a 40 year old female who presented  to the emergency department secondary to complaints of worsening redness  and swelling of the right lower leg that started one day ago.  She also  reports having fevers, chills, and headaches for two days.  The redness  she reports started on the back of her leg and has spread to the sides  of her right leg and is progressing.  She reports the leg is also  painful to touch in that area.   PAST MEDICAL HISTORY:  1. Morbid obesity.  2. Anemia.   PAST SURGICAL HISTORY:  1. Cholecystectomy.  2. Arthroscopy of the right knee.   MEDICATIONS:  1. Multivitamin with iron.  2. Prevacid.   ALLERGIES:  NO KNOWN DRUG ALLERGIES.   SOCIAL HISTORY:  Is a school bus driver.  Nonsmoker, nondrinker.   FAMILY HISTORY:  Noncontributory.   REVIEW OF SYSTEMS:  Pertinent as mentioned above.   PHYSICAL EXAMINATION:  GENERAL:  This is a morbidly obese 40 year old  female in discomfort but no acute distress.  VITAL SIGNS:  Temperature 99.4, blood pressure 146/77, heart rate 134,  respirations 24, O2 saturations 95%.  HEENT:  Normocephalic, atraumatic.  Pupils equally round and reactive to  light.  Extraocular muscles are intact.  Funduscopic benign.  Oropharynx  is clear.  NECK:  Supple, full range of motion.  No thyromegaly, adenopathy,  jugular venous distension.  CARDIOVASCULAR:  Tachycardiac rate and rhythm, no murmurs, gallops, or  rubs.  LUNGS:  Clear to  auscultation bilaterally.  ABDOMEN:  Positive bowel sounds, soft, nontender, nondistended.  EXTREMITIES:  Right lower extremity in the area of the posterior and  lateral right thigh to the knee area with positive erythema.  The  redness is confluent.  NEUROLOGIC:  Nonfocal.   LABORATORY DATA:  White blood cell count 19.4, hemoglobin 11.6,  hematocrit 35.0, MCV 73.6, platelets 409, neutrophils 91%, lymphocytes  6%.  Sodium 137, potassium 3.8, chloride 104, carbon dioxide 25, BUN 8,  creatinine 0.8, glucose 163, calcium 8.7, albumin 3.0, AST 29, ALT 25.   ASSESSMENT:  40 year old female being admitted with:  1. Cellulitis, right lower extremity.  2. Mild hyperglycemia.  3. Tachycardia.  4. Mild iron deficiency anemia.  5. Morbid obesity.   PLAN:  The patient has been admitted for IV antibiotic therapy.  IV  vancomycin has been administered, and ciprofloxacin has also been  ordered.  Serial blood sugars will be checked, and sliding scale insulin  coverage has been ordered as well.  Iron studies have also been ordered  to evaluate for the mild  microcytic anemia.  DVT and GI prophylaxis has  also been ordered.      Della Goo, M.D.  Electronically Signed     HJ/MEDQ  D:  08/20/2006  T:  08/20/2006  Job:  [id number]

## 2010-09-03 NOTE — Op Note (Signed)
[facility]  Patient:    [name], [name] Visit Number: [id number] MRN: [id number]          Service Type: OBV Location: 4W 0453 01 Attending Physician:  Bonnetta Barry Dictated by:   Velora Heckler, M.D. Proc. Date: 03/23/01 Admit Date:  03/23/2001 Discharge Date: 03/24/2001   CC:         Griffith Citron, M.D.  [facility]   Operative Report  PREOPERATIVE DIAGNOSES:  Biliary dyskinesia, abdominal pain.  POSTOPERATIVE DIAGNOSES:  Biliary dyskinesia, abdominal pain.  PROCEDURE:  Laparoscopic cholecystectomy with intraoperative cholangiography.  SURGEON:  Velora Heckler, M.D.  ASSISTANT:  Gita Kudo, M.D.  ANESTHESIA:  General.  ESTIMATED BLOOD LOSS:  Minimal.  PREPARATION:  Betadine.  COMPLICATIONS:  None.  INDICATIONS:  The patient is a 40 year old female, who presents at the request of Dr. Ritta Slot, following an extensive work-up for episodic epigastric abdominal pain radiating to the back.  The patients first episode was in May 2002 on vacation in [place], [place].  The patient underwent studies at [facility].  Ultrasound was normal.  Abdominal x-rays were normal.  The patient was seen by Dr. Ritta Slot.  Upper endoscopy was performed and was largely unremarkable.  Laboratory study tests were essentially normal.  Nuclear medicine PIPIDA scan was performed at [facility] in September 2002.  This demonstrated a slightly low ejection fraction of 42%.  The patient has a strong family history of gallbladder disease involving both her mother, maternal aunts, and sister, all of whom have had cholecystectomy.  The patient now comes to surgery for laparoscopic cholecystectomy and intraoperative cholangiography for presumed biliary dyskinesia and abdominal pain.  DESCRIPTION OF PROCEDURE:  The procedure is done in OR #1 at the [facility].  The  patient is brought to the operating room and placed in a supine position on the operating room table.  Following administration of general anesthesia, the patient is prepped and draped in the usual strict aseptic fashion.  After ascertaining that an adequate level of anesthesia had been obtained, an infraumbilical incision was made in the midline with a #15 blade.  Dissection was carried down through the subcutaneous tissues.  The fascia is incised in the midline.  The peritoneal cavity is entered cautiously.  An 0 Vicryl pursestring suture is placed in the fascia.  An Hasson cannula is introduced under direct vision and secured with the pursestring suture.  The abdomen is insufflated with carbon dioxide. Laparoscope is introduced and the abdomen explored.  The patient is placed in steep reverse Trendelenburg and rolled to the patients left side.  The fundus of the gallbladder is identified and retracted cephalad.  Dissection is begun at the neck of the gallbladder.  Peritoneum is incised.  Cystic duct is dissected out along its length.  A clip is placed at the neck of the gallbladder.  Cystic duct is incised.  Clear bile emanates from the cystic duct.  Cook cholangiography catheter is inserted though a stab wound in the right upper quadrant.  It is inserted into the cystic duct and secured with a Ligaclip.  Using C-arm fluoroscopy, real-time cholangiography is performed. There is rapid filling of a rather long, tortuous cystic duct.  Common bile duct appears normal.  There is rapid flow into the duodenum without filling defect or obstruction.  There was reflux contrast into the primary and secondary biliary radicles in both the left and right ductal  systems.  No anatomic abnormality is identified.  The clip is removed.  The catheter was removed.  The cystic duct is triply clipped and divided.  Next, the cystic artery is dissected out along its length, doubly clipped, and  divided. Gallbladder is excised from the gallbladder bed using the hook electrocautery for hemostasis.  The gallbladder is completely excised and placed into an EndoCatch bag.  The gallbladder is extracted through the umbilical port without difficulty.  The umbilical wound is then closed with the 0 Vicryl pursestring suture.  All ports are removed and pneumoperitoneum released. Port sites are anesthetized with local anesthetic.  All port sites are closed with interrupted 4-0 Vicryl subcuticular sutures.  The wounds are washed and dried, and Benzoin and Steri-Strips are applied.  Sterile gauze dressings are applied.  The patient is awakened from anesthesia and brought to the recovery room in stable condition.  The patient tolerated the procedure well. Dictated by:   Velora Heckler, M.D. Attending Physician:  Bonnetta Barry DD:  03/23/01 TD:  03/25/01 Job: [id number]

## 2010-09-03 NOTE — Op Note (Signed)
NAMEKIERNAN, FARKAS            ACCOUNT NO.:  [id number]   MEDICAL RECORD NO.:  [id number]          PATIENT TYPE:  AMB   LOCATION:  DSC                          FACILITY:  [facility]   PHYSICIAN:  Nadara Mustard, MD     DATE OF BIRTH:  [birth year]   DATE OF PROCEDURE:  07/07/2005  DATE OF DISCHARGE:                                 OPERATIVE REPORT   PREOP DIAGNOSIS:  Lateral meniscal tear, right knee.   POSTOP DIAGNOSIS:  1.  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  2.  Lateral meniscal tear, right knee.   PROCEDURE:  1.  Partial lateral minimal meniscectomy, right knee.  2.  Abrasion chondroplasty, lateral facette patella right knee.   SURGEON:  Nadara Mustard, MD   ANESTHESIA:  General.   ESTIMATED BLOOD LOSS:  Minimal.   ANTIBIOTICS:  None.   DRAINS:  None.   COMPLICATIONS:  None.   DISPOSITION:  To PACU in stable condition.   INDICATIONS FOR PROCEDURE:  The patient is a 40 year old woman with  mechanical symptoms of her right knee. She has failed conservative care. MRI  scan confirmed a lateral meniscal tear. She presents at this time for  arthroscopic intervention. Risks and benefits were discussed including  infection, neurovascular injury, persistent pain, need for additional  surgery. The patient states she understands and wished proceed at this time.   DESCRIPTION OF PROCEDURE:  The patient was brought to OR room 8 and  underwent general anesthetic. After adequate level of anesthesia obtained,  the patient's right lower extremity was prepped using DuraPrep and draped in  a sterile field and latex precautions were observed. The scope was inserted  through the inferior lateral portal,  inferior medial working portal was  established. Visualization showed significant amount of synovitis and the  synovium was debrided. Examination of the medial joint line showed there to  be an intact and stable medial meniscus. No osteochondral defects of the  medial  femoral condyle or medial tibial plateau and no medial meniscal  pathology. Examination of notch showed an intact and stable ACL. Examination  of the lateral joint line showed a posterior lateral meniscal tear which was  debrided. There was good articular cartilage of the lateral femoral condyle  and lateral tibial plateau. Examination with the knee extended of the  patellofemoral joint showed synovitis. This was debrided. A plica was  debrided and the patient had a osteochondral defect of the lateral facette  of the patella and this was also debrided. A survey of all compartments  including medial and lateral gutters was again performed and there no loose  bodies. The instruments were removed, the portals were closed using 4-0  nylon. The wounds were covered with Adaptic orthopedic sponges, ABD, Webril  and a Coban dressing. The joint was infused with total of 20 mL of 0.5%  Marcaine plain and 4 milligrams of morphine. The patient was extubated,  taken to PACU in stable condition. Plan for weightbearing as tolerated.  Follow-up in office in 2 weeks.      Nadara Mustard, MD  Electronically Signed  MVD/MEDQ  D:  07/07/2005  T:  07/09/2005  Job:  [id number]

## 2010-09-03 NOTE — Discharge Summary (Signed)
NAMEAILEENA, [name]            ACCOUNT NO.:  [id number]   MEDICAL RECORD NO.:  [id number]          PATIENT TYPE:  INP   LOCATION:  5038                         FACILITY:  [facility]   PHYSICIAN:  Theone Stanley, MD   DATE OF BIRTH:  09/07/1970   DATE OF ADMISSION:  02/11/2004  DATE OF DISCHARGE:  02/14/2004                                 DISCHARGE SUMMARY   ADMITTING DIAGNOSES:  1.  Cellulitis.  2.  Morbid obesity.  3.  History of cellulitis of the right foot.   DISCHARGE DIAGNOSES:  1.  Cellulitis.  2.  Morbid obesity.  3.  History of cellulitis of the right foot.   CONSULTATIONS:  None.   PROCEDURES:  None.   HOSPITAL COURSE:  [name] is a very pleasant 40 year old morbidly obese  female who presents with right foot and leg swelling and erythema.  The  patient recently had a toenail infection and ingrown toenail surgery by Dr.  Karl Bales.  She noticed it was red and swollen, and this continued to go up her  leg.  Because of this, she contacted Dr. Karl Bales and they indicated to go to  the ER.  At that point in time, labs were drawn and her white count was 24,  and the patient was started on Ancef.  On the 27th, the erythema and pain  had improved, and by the 29th, it had markedly improved enough for the  patient to be discharged on Keflex 500 mg 1 p.o. 4 times daily.  She left  the hospital afebrile, blood pressures were stable and the patient left the  hospital in stable condition.   FOLLOWUP:  The patient is to follow up with Dr. Dulcy Fanny of podiatry in  2 weeks and her primary care physician as needed.   DISCHARGE MEDICATIONS:  Keflex 500 mg 1 p.o. 4 times daily for a total of 10  days.       AEJ/MEDQ  D:  02/13/2004  T:  02/13/2004  Job:  [id number]   cc:   Dulcy Fanny D.P.M.

## 2010-10-22 ENCOUNTER — Emergency Department (HOSPITAL_BASED_OUTPATIENT_CLINIC_OR_DEPARTMENT_OTHER)
Admission: EM | Admit: 2010-10-22 | Discharge: 2010-10-22 | Disposition: A | Discharge status: Home / Self Care | Payer: BC Managed Care – PPO | Attending: Emergency Medicine | Admitting: Emergency Medicine

## 2010-10-22 DIAGNOSIS — E119 Type 2 diabetes mellitus without complications: Secondary | ICD-10-CM | POA: Insufficient documentation

## 2010-10-22 DIAGNOSIS — K219 Gastro-esophageal reflux disease without esophagitis: Secondary | ICD-10-CM | POA: Insufficient documentation

## 2010-10-22 DIAGNOSIS — R51 Headache: Secondary | ICD-10-CM | POA: Insufficient documentation

## 2010-10-22 DIAGNOSIS — E039 Hypothyroidism, unspecified: Secondary | ICD-10-CM | POA: Insufficient documentation

## 2010-10-22 LAB — URINALYSIS, ROUTINE W REFLEX MICROSCOPIC
Bilirubin Urine: NEGATIVE
Glucose, UA: NEGATIVE mg/dL
Hgb urine dipstick: NEGATIVE
Ketones, ur: NEGATIVE mg/dL
Leukocytes, UA: NEGATIVE
Nitrite: NEGATIVE
Protein, ur: NEGATIVE mg/dL
Specific Gravity, Urine: 1.02 (ref 1.005–1.030)
Urobilinogen, UA: 0.2 mg/dL (ref 0.0–1.0)
pH: 6.5 (ref 5.0–8.0)

## 2010-11-19 ENCOUNTER — Emergency Department (INDEPENDENT_AMBULATORY_CARE_PROVIDER_SITE_OTHER): Payer: BC Managed Care – PPO

## 2010-11-19 ENCOUNTER — Encounter: Payer: Self-pay | Admitting: *Deleted

## 2010-11-19 ENCOUNTER — Emergency Department (HOSPITAL_BASED_OUTPATIENT_CLINIC_OR_DEPARTMENT_OTHER)
Admission: EM | Admit: 2010-11-19 | Discharge: 2010-11-20 | Disposition: A | Discharge status: Home / Self Care | Payer: BC Managed Care – PPO | Attending: Emergency Medicine | Admitting: Emergency Medicine

## 2010-11-19 ENCOUNTER — Emergency Department (HOSPITAL_COMMUNITY)
Admission: EM | Admit: 2010-11-19 | Discharge: 2010-11-19 | Discharge status: Against Medical Advice | Payer: BC Managed Care – PPO | Attending: Emergency Medicine | Admitting: Emergency Medicine

## 2010-11-19 DIAGNOSIS — R1031 Right lower quadrant pain: Secondary | ICD-10-CM | POA: Insufficient documentation

## 2010-11-19 DIAGNOSIS — K921 Melena: Secondary | ICD-10-CM

## 2010-11-19 LAB — COMPREHENSIVE METABOLIC PANEL
ALT: 25 U/L (ref 0–35)
AST: 25 U/L (ref 0–37)
Albumin: 3.4 g/dL — ABNORMAL LOW (ref 3.5–5.2)
Alkaline Phosphatase: 94 U/L (ref 39–117)
BUN: 12 mg/dL (ref 6–23)
CO2: 28 mEq/L (ref 19–32)
Calcium: 9.6 mg/dL (ref 8.4–10.5)
Chloride: 99 mEq/L (ref 96–112)
Creatinine, Ser: 0.7 mg/dL (ref 0.50–1.10)
GFR calc Af Amer: >60 mL/min (ref >60)
GFR calc non Af Amer: >60 mL/min (ref >60)
Glucose, Bld: 126 mg/dL — ABNORMAL HIGH (ref 70–99)
Potassium: 4.2 mEq/L (ref 3.5–5.1)
Sodium: 139 mEq/L (ref 135–145)
Total Bilirubin: 0.3 mg/dL (ref 0.3–1.2)
Total Protein: 7.8 g/dL (ref 6.0–8.3)

## 2010-11-19 LAB — CBC
HCT: 38.4 % (ref 36.0–46.0)
Hemoglobin: 12.5 g/dL (ref 12.0–15.0)
MCH: 25.5 pg — ABNORMAL LOW (ref 26.0–34.0)
MCHC: 32.6 g/dL (ref 30.0–36.0)
MCV: 78.2 fL (ref 78.0–100.0)
Platelets: 417 10*3/uL — ABNORMAL HIGH (ref 150–400)
RBC: 4.91 MIL/uL (ref 3.87–5.11)
RDW: 15.9 % — ABNORMAL HIGH (ref 11.5–15.5)
WBC: 12.7 10*3/uL — ABNORMAL HIGH (ref 4.0–10.5)

## 2010-11-19 LAB — URINALYSIS, ROUTINE W REFLEX MICROSCOPIC
Bilirubin Urine: NEGATIVE
Glucose, UA: 100 mg/dL — AB
Hgb urine dipstick: NEGATIVE
Ketones, ur: NEGATIVE mg/dL
Leukocytes, UA: NEGATIVE
Nitrite: NEGATIVE
Protein, ur: NEGATIVE mg/dL
Specific Gravity, Urine: 1.028 (ref 1.005–1.030)
Urobilinogen, UA: 1 mg/dL (ref 0.0–1.0)
pH: 5.5 (ref 5.0–8.0)

## 2010-11-19 LAB — PREGNANCY, URINE: Preg Test, Ur: NEGATIVE

## 2010-11-19 LAB — LIPASE, BLOOD: Lipase: 65 U/L — ABNORMAL HIGH (ref 11–59)

## 2010-11-19 MED ORDER — HYDROMORPHONE HCL 1 MG/ML IJ SOLN
1.0000 mg | Freq: Once | INTRAMUSCULAR | Status: AC
Start: 2010-11-19 — End: 2010-11-19
  Administered 2010-11-19: 1 mg via INTRAVENOUS
  Filled 2010-11-19: qty 1

## 2010-11-19 MED ORDER — HYDROMORPHONE HCL 1 MG/ML IJ SOLN
1.0000 mg | Freq: Once | INTRAMUSCULAR | Status: AC
  Administered 2010-11-19: 1 mg via INTRAVENOUS

## 2010-11-19 MED ORDER — OXYCODONE-ACETAMINOPHEN 5-325 MG PO TABS: 2.0000 | ORAL_TABLET | ORAL | Status: AC | PRN

## 2010-11-19 MED ORDER — ONDANSETRON HCL 4 MG/2ML IJ SOLN
4.0000 mg | Freq: Once | INTRAMUSCULAR | Status: AC
  Administered 2010-11-19: 4 mg via INTRAVENOUS

## 2010-11-19 MED ORDER — ONDANSETRON HCL 4 MG PO TABS: 4.0000 mg | ORAL_TABLET | Freq: Four times a day (QID) | ORAL | Status: AC

## 2010-11-19 MED ORDER — IOHEXOL 300 MG/ML  SOLN: 100.0000 mL | Freq: Once | INTRAMUSCULAR | Status: AC | PRN

## 2010-11-19 MED ORDER — SODIUM CHLORIDE 0.9 % IV SOLN
INTRAVENOUS | Status: DC
  Administered 2010-11-19: 22:00:00 via INTRAVENOUS

## 2010-11-19 NOTE — ED Notes (Signed)
Right lower quad pain since yesterday. Diarrhea. Nausea.

## 2010-11-19 NOTE — ED Provider Notes (Signed)
History     CSN: [id number] Arrival date & time: 11/19/2010  8:56 PM  Chief Complaint  Patient presents with  . Abdominal Pain   Patient is a 40 y.o. female presenting with abdominal pain. The history is provided by the patient.  Abdominal Pain The primary symptoms of the illness include abdominal pain, nausea and diarrhea. The primary symptoms of the illness do not include fever, shortness of breath, vomiting, dysuria, vaginal discharge or vaginal bleeding. The current episode started yesterday. The onset of the illness was gradual. The problem has not changed since onset. The abdominal pain is located in the RLQ. The abdominal pain does not radiate. The severity of the abdominal pain is 7/10. The abdominal pain is relieved by being still. The abdominal pain is exacerbated by movement.  Diarrhea characteristics: nonbloody.  The patient states that she believes she is currently not pregnant. The patient has had a change in bowel habit. Symptoms associated with the illness do not include chills, anorexia, diaphoresis, hematuria or back pain. Significant associated medical issues do not include gallstones or diverticulitis.    History reviewed. No pertinent past medical history.  Past Surgical History  Procedure Date  . Cholecystectomy     No family history on file.  History  Substance Use Topics  . Smoking status: Current Everyday Smoker -- 0.5 packs/day  . Smokeless tobacco: Not on file  . Alcohol Use: Yes    OB History    Grav Para Term Preterm Abortions TAB SAB Ect Mult Living                  Review of Systems  Constitutional: Negative for fever, chills and diaphoresis.  HENT: Negative for neck pain and neck stiffness.   Eyes: Negative for pain.  Respiratory: Negative for shortness of breath.   Cardiovascular: Negative for chest pain.  Gastrointestinal: Positive for nausea, abdominal pain and diarrhea. Negative for vomiting and anorexia.  Genitourinary: Negative for  dysuria, hematuria, vaginal bleeding and vaginal discharge.  Musculoskeletal: Negative for back pain.  Skin: Negative for rash.  Neurological: Negative for headaches.  All other systems reviewed and are negative.    Physical Exam  BP 129/79  Pulse 99  Temp(Src) 98.3 F (36.8 C) (Oral)  Resp 24  SpO2 98%  LMP 11/14/2010  Physical Exam  Constitutional: She is oriented to person, place, and time. She appears well-developed and well-nourished.  HENT:  Head: Normocephalic and atraumatic.  Eyes: Conjunctivae and EOM are normal. Pupils are equal, round, and reactive to light.  Neck: Trachea normal. Neck supple. No thyromegaly present.  Cardiovascular: Normal rate, regular rhythm, S1 normal, S2 normal and normal pulses.     No systolic murmur is present   No diastolic murmur is present  Pulses:      Radial pulses are 2+ on the right side, and 2+ on the left side.  Pulmonary/Chest: Effort normal and breath sounds normal. She has no wheezes. She has no rhonchi. She has no rales. She exhibits no tenderness.  Abdominal: Soft. Normal appearance and bowel sounds are normal. She exhibits no mass. There is tenderness in the right lower quadrant. There is guarding. There is no rigidity, no rebound, no CVA tenderness and negative Murphy's sign.  Musculoskeletal:       BLE:s Calves nontender, no cords or erythema, negative Homans sign  Neurological: She is alert and oriented to person, place, and time. She has normal strength. No cranial nerve deficit or sensory deficit. GCS eye subscore  is 4. GCS verbal subscore is 5. GCS motor subscore is 6.  Skin: Skin is warm and dry. No rash noted. She is not diaphoretic.  Psychiatric: Her speech is normal.       Cooperative and appropriate    ED Course  Procedures  MDM RLQ pain evaluated appendix with CT scan, still having pain on recheck. Plan [place] in am as CT not available tonight. Pain control in ED, labs reviewed  Results for orders placed during  the hospital encounter of 11/19/10  URINALYSIS, ROUTINE W REFLEX MICROSCOPIC      Component Value Range   Color, Urine YELLOW  YELLOW    Appearance CLEAR  CLEAR    Specific Gravity, Urine 1.028  1.005 - 1.030    pH 5.5  5.0 - 8.0    Glucose, UA 100 (*) NEGATIVE (mg/dL)   Hgb urine dipstick NEGATIVE  NEGATIVE    Bilirubin Urine NEGATIVE  NEGATIVE    Ketones, ur NEGATIVE  NEGATIVE (mg/dL)   Protein, ur NEGATIVE  NEGATIVE (mg/dL)   Urobilinogen, UA 1.0  0.0 - 1.0 (mg/dL)   Nitrite NEGATIVE  NEGATIVE    Leukocytes, UA NEGATIVE  NEGATIVE   PREGNANCY, URINE      Component Value Range   Preg Test, Ur NEGATIVE    CBC      Component Value Range   WBC 12.7 (*) 4.0 - 10.5 (K/uL)   RBC 4.91  3.87 - 5.11 (MIL/uL)   Hemoglobin 12.5  12.0 - 15.0 (g/dL)   HCT 40.9  81.1 - 91.4 (%)   MCV 78.2  78.0 - 100.0 (fL)   MCH 25.5 (*) 26.0 - 34.0 (pg)   MCHC 32.6  30.0 - 36.0 (g/dL)   RDW 78.2 (*) 95.6 - 15.5 (%)   Platelets 417 (*) 150 - 400 (K/uL)  COMPREHENSIVE METABOLIC PANEL      Component Value Range   Sodium 139  135 - 145 (mEq/L)   Potassium 4.2  3.5 - 5.1 (mEq/L)   Chloride 99  96 - 112 (mEq/L)   CO2 28  19 - 32 (mEq/L)   Glucose, Bld 126 (*) 70 - 99 (mg/dL)   BUN 12  6 - 23 (mg/dL)   Creatinine, Ser 2.13  0.50 - 1.10 (mg/dL)   Calcium 9.6  8.4 - 08.6 (mg/dL)   Total Protein 7.8  6.0 - 8.3 (g/dL)   Albumin 3.4 (*) 3.5 - 5.2 (g/dL)   AST 25  0 - 37 (U/L)   ALT 25  0 - 35 (U/L)   Alkaline Phosphatase 94  39 - 117 (U/L)   Total Bilirubin 0.3  0.3 - 1.2 (mg/dL)   GFR calc non Af Amer >60  >60 (mL/min)   GFR calc Af Amer >60  >60 (mL/min)  LIPASE, BLOOD      Component Value Range   Lipase 65 (*) 11 - 59 (U/L)   Ct Abdomen Pelvis W Contrast  11/19/2010  *RADIOLOGY REPORT*  Clinical Data: Right lower quadrant abdominal pain  CT ABDOMEN AND PELVIS WITH CONTRAST  Technique:  Multidetector CT imaging of the abdomen and pelvis was performed following the standard protocol during bolus  administration of intravenous contrast.  Contrast: 100 ml Omnipaque-300 IV  Comparison: 06/03/2010  Findings: Lung bases are clear.  Hepatic steatosis.  Spleen, pancreas, adrenal glands within normal limits.  Status post cholecystectomy.  No intrahepatic or extrahepatic ductal dilatation.  Kidneys within normal limits.  No hydronephrosis.  No evidence of bowel obstruction.  Normal appendix.  No evidence of abdominal aortic aneurysm.  No abdominopelvic ascites.  No suspicious abdominopelvic lymphadenopathy.  Uterus and ovaries are unremarkable.  Bladder is within normal limits.  Degenerative changes of the visualized thoracolumbar spine.  IMPRESSION: Normal appendix.  No evidence of bowel obstruction.  Hepatic steatosis.  Original Report Authenticated By: Charline Bills, M.D.          Sunnie Nielsen, MD 11/19/10 [phone]

## 2010-11-21 ENCOUNTER — Other Ambulatory Visit (HOSPITAL_BASED_OUTPATIENT_CLINIC_OR_DEPARTMENT_OTHER): Payer: Self-pay | Admitting: Emergency Medicine

## 2010-11-21 ENCOUNTER — Ambulatory Visit (HOSPITAL_BASED_OUTPATIENT_CLINIC_OR_DEPARTMENT_OTHER)
Admit: 2010-11-21 | Discharge: 2010-11-21 | Disposition: A | Discharge status: Home / Self Care | Payer: BC Managed Care – PPO | Attending: Emergency Medicine | Admitting: Emergency Medicine

## 2010-11-21 DIAGNOSIS — R1031 Right lower quadrant pain: Secondary | ICD-10-CM

## 2010-11-21 DIAGNOSIS — R52 Pain, unspecified: Secondary | ICD-10-CM

## 2010-11-21 DIAGNOSIS — R110 Nausea: Secondary | ICD-10-CM | POA: Insufficient documentation

## 2010-11-21 DIAGNOSIS — Z9889 Other specified postprocedural states: Secondary | ICD-10-CM

## 2011-01-11 LAB — COMPREHENSIVE METABOLIC PANEL
ALT: 20
AST: 15
Albumin: 2.6 — ABNORMAL LOW
Alkaline Phosphatase: 68
BUN: 8
CO2: 27
Calcium: 8.5
Chloride: 106
Creatinine, Ser: 0.68
GFR calc Af Amer: >60
GFR calc non Af Amer: >60
Glucose, Bld: 115 — ABNORMAL HIGH
Potassium: 4.2
Sodium: 137
Total Bilirubin: 0.8
Total Protein: 5.9 — ABNORMAL LOW

## 2011-01-11 LAB — CBC
HCT: 31.8 — ABNORMAL LOW
HCT: 35.9 — ABNORMAL LOW
Hemoglobin: 10.5 — ABNORMAL LOW
Hemoglobin: 11.8 — ABNORMAL LOW
MCHC: 33
MCHC: 33
MCV: 76.4 — ABNORMAL LOW
MCV: 76.8 — ABNORMAL LOW
Platelets: 366
Platelets: 420 — ABNORMAL HIGH
RBC: 4.16
RBC: 4.67
RDW: 15.4
RDW: 15.9 — ABNORMAL HIGH
WBC: 10.7 — ABNORMAL HIGH
WBC: 8.9

## 2011-01-11 LAB — POCT I-STAT, CHEM 8
BUN: 14
Calcium, Ion: 1.25
Chloride: 103
Creatinine, Ser: 0.8
Glucose, Bld: 111 — ABNORMAL HIGH
HCT: 37
Hemoglobin: 12.6
Potassium: 3.8
Sodium: 138
TCO2: 30

## 2011-01-11 LAB — DIFFERENTIAL
Basophils Absolute: 0.1
Basophils Relative: 1
Eosinophils Absolute: 0.5
Eosinophils Relative: 5
Lymphocytes Relative: 27
Lymphs Abs: 2.9
Monocytes Absolute: 1
Monocytes Relative: 9
Neutro Abs: 6.3
Neutrophils Relative %: 59

## 2011-01-11 LAB — CULTURE, BLOOD (ROUTINE X 2)
Culture: NO GROWTH
Culture: NO GROWTH

## 2011-01-11 LAB — HEMOGLOBIN A1C
Hgb A1c MFr Bld: 6.9 — ABNORMAL HIGH
Mean Plasma Glucose: 168

## 2011-01-16 ENCOUNTER — Emergency Department (HOSPITAL_COMMUNITY)
Admission: EM | Admit: 2011-01-16 | Discharge: 2011-01-17 | Disposition: A | Discharge status: Home / Self Care | Payer: BC Managed Care – PPO | Attending: Emergency Medicine | Admitting: Emergency Medicine

## 2011-01-16 DIAGNOSIS — E039 Hypothyroidism, unspecified: Secondary | ICD-10-CM | POA: Insufficient documentation

## 2011-01-16 DIAGNOSIS — R509 Fever, unspecified: Secondary | ICD-10-CM | POA: Insufficient documentation

## 2011-01-16 DIAGNOSIS — J40 Bronchitis, not specified as acute or chronic: Secondary | ICD-10-CM | POA: Insufficient documentation

## 2011-01-16 DIAGNOSIS — K219 Gastro-esophageal reflux disease without esophagitis: Secondary | ICD-10-CM | POA: Insufficient documentation

## 2011-01-16 DIAGNOSIS — R5381 Other malaise: Secondary | ICD-10-CM | POA: Insufficient documentation

## 2011-01-16 DIAGNOSIS — R079 Chest pain, unspecified: Secondary | ICD-10-CM | POA: Insufficient documentation

## 2011-01-16 DIAGNOSIS — F17200 Nicotine dependence, unspecified, uncomplicated: Secondary | ICD-10-CM | POA: Insufficient documentation

## 2011-01-16 DIAGNOSIS — R5383 Other fatigue: Secondary | ICD-10-CM | POA: Insufficient documentation

## 2011-01-16 DIAGNOSIS — R05 Cough: Secondary | ICD-10-CM | POA: Insufficient documentation

## 2011-01-16 DIAGNOSIS — J3489 Other specified disorders of nose and nasal sinuses: Secondary | ICD-10-CM | POA: Insufficient documentation

## 2011-01-16 DIAGNOSIS — R059 Cough, unspecified: Secondary | ICD-10-CM | POA: Insufficient documentation

## 2011-01-24 LAB — DIFFERENTIAL
Basophils Absolute: 0
Basophils Relative: 0
Eosinophils Absolute: 0.3
Eosinophils Relative: 3
Lymphocytes Relative: 19
Lymphs Abs: 2.3
Monocytes Absolute: 0.6
Monocytes Relative: 5
Neutro Abs: 8.9 — ABNORMAL HIGH
Neutrophils Relative %: 73

## 2011-01-24 LAB — I-STAT 8, (EC8 V) (CONVERTED LAB)
BUN: 12
Bicarbonate: 25.6 — ABNORMAL HIGH
Chloride: 105
Glucose, Bld: 102 — ABNORMAL HIGH
HCT: 36
Hemoglobin: 12.2
Operator id: 257131
Potassium: 4.1
Sodium: 137
TCO2: 27
pCO2, Ven: 43.4 — ABNORMAL LOW
pH, Ven: 7.378 — ABNORMAL HIGH

## 2011-01-24 LAB — POCT I-STAT CREATININE
Creatinine, Ser: 0.8
Operator id: 257131

## 2011-01-24 LAB — URINALYSIS, ROUTINE W REFLEX MICROSCOPIC
Bilirubin Urine: NEGATIVE
Glucose, UA: NEGATIVE
Hgb urine dipstick: NEGATIVE
Ketones, ur: NEGATIVE
Nitrite: NEGATIVE
Protein, ur: NEGATIVE
Specific Gravity, Urine: 1.023
Urobilinogen, UA: 0.2
pH: 6

## 2011-01-24 LAB — POCT URINALYSIS DIP (DEVICE)
Glucose, UA: NEGATIVE
Nitrite: NEGATIVE
Operator id: 200941
Protein, ur: 30 — AB
Specific Gravity, Urine: >=1.03
Urobilinogen, UA: 0.2
pH: 5.5

## 2011-01-24 LAB — URINE MICROSCOPIC-ADD ON

## 2011-01-24 LAB — WET PREP, GENITAL
Clue Cells Wet Prep HPF POC: NONE SEEN
Trich, Wet Prep: NONE SEEN
Yeast Wet Prep HPF POC: NONE SEEN

## 2011-01-24 LAB — CBC
HCT: 33.3 — ABNORMAL LOW
Hemoglobin: 11.1 — ABNORMAL LOW
MCHC: 33.3
MCV: 76.3 — ABNORMAL LOW
Platelets: 355
RBC: 4.37
RDW: 16.5 — ABNORMAL HIGH
WBC: 12.2 — ABNORMAL HIGH

## 2011-01-24 LAB — POCT PREGNANCY, URINE
Operator id: 200941
Preg Test, Ur: NEGATIVE

## 2011-01-24 LAB — RPR: RPR Ser Ql: NONREACTIVE

## 2011-01-24 LAB — GC/CHLAMYDIA PROBE AMP, GENITAL
Chlamydia, DNA Probe: NEGATIVE
GC Probe Amp, Genital: NEGATIVE

## 2011-01-28 LAB — CULTURE, BLOOD (ROUTINE X 2)
Culture: NO GROWTH
Culture: NO GROWTH

## 2011-01-28 LAB — CBC
HCT: 31 — ABNORMAL LOW
HCT: 32.1 — ABNORMAL LOW
Hemoglobin: 10.3 — ABNORMAL LOW
Hemoglobin: 10.8 — ABNORMAL LOW
MCHC: 33.2
MCHC: 33.5
MCV: 74.3 — ABNORMAL LOW
MCV: 74.5 — ABNORMAL LOW
Platelets: 357
Platelets: 390
RBC: 4.17
RBC: 4.33
RDW: 17.2 — ABNORMAL HIGH
RDW: 17.5 — ABNORMAL HIGH
WBC: 15.8 — ABNORMAL HIGH
WBC: 9.2

## 2011-01-28 LAB — DIFFERENTIAL
Basophils Absolute: 0
Basophils Absolute: 0.1
Basophils Relative: 0
Basophils Relative: 1
Eosinophils Absolute: 0.1
Eosinophils Absolute: 0.3
Eosinophils Relative: 0
Eosinophils Relative: 3
Lymphocytes Relative: 18
Lymphocytes Relative: 9 — ABNORMAL LOW
Lymphs Abs: 1.3
Lymphs Abs: 1.6
Monocytes Absolute: 0.7
Monocytes Absolute: 0.7
Monocytes Relative: 5
Monocytes Relative: 8
Neutro Abs: 13.7 — ABNORMAL HIGH
Neutro Abs: 6.5
Neutrophils Relative %: 71
Neutrophils Relative %: 87 — ABNORMAL HIGH

## 2011-01-28 LAB — COMPREHENSIVE METABOLIC PANEL
ALT: 21
AST: 21
Albumin: 2.7 — ABNORMAL LOW
Alkaline Phosphatase: 68
BUN: 5 — ABNORMAL LOW
CO2: 23
Calcium: 8.3 — ABNORMAL LOW
Chloride: 109
Creatinine, Ser: 0.68
GFR calc Af Amer: >60
GFR calc non Af Amer: >60
Glucose, Bld: 116 — ABNORMAL HIGH
Potassium: 3.7
Sodium: 139
Total Bilirubin: 0.7
Total Protein: 6.6

## 2011-01-28 LAB — BASIC METABOLIC PANEL
BUN: 6
CO2: 23
Calcium: 8.5
Chloride: 103
Creatinine, Ser: 0.69
GFR calc Af Amer: >60
GFR calc non Af Amer: >60
Glucose, Bld: 139 — ABNORMAL HIGH
Potassium: 3.8
Sodium: 134 — ABNORMAL LOW

## 2011-01-28 LAB — IRON: Iron: 17 — ABNORMAL LOW

## 2011-01-28 LAB — TSH: TSH: 2.641

## 2011-01-28 LAB — HEMOGLOBIN A1C
Hgb A1c MFr Bld: 7 — ABNORMAL HIGH
Mean Plasma Glucose: 172

## 2011-01-28 LAB — FERRITIN: Ferritin: 71 (ref 10–291)

## 2011-02-01 LAB — CBC
HCT: 30.4 — ABNORMAL LOW
HCT: 36.2
Hemoglobin: 10.1 — ABNORMAL LOW
Hemoglobin: 12.1
MCHC: 33.3
MCHC: 33.3
MCV: 74 — ABNORMAL LOW
MCV: 74 — ABNORMAL LOW
Platelets: 369
Platelets: 435 — ABNORMAL HIGH
RBC: 4.11
RBC: 4.89
RDW: 17.5 — ABNORMAL HIGH
RDW: 17.6 — ABNORMAL HIGH
WBC: 13.1 — ABNORMAL HIGH
WBC: 8

## 2011-02-01 LAB — BLOOD GAS, ARTERIAL
Acid-Base Excess: 1.5
Bicarbonate: 24.7 — ABNORMAL HIGH
FIO2: 21
O2 Saturation: 97.4
Patient temperature: 98.6
TCO2: 22.3
pCO2 arterial: 35.4
pH, Arterial: 7.458 — ABNORMAL HIGH
pO2, Arterial: 95.8

## 2011-02-01 LAB — BASIC METABOLIC PANEL
BUN: 7
BUN: 8
CO2: 24
CO2: 28
Calcium: 8.9
Calcium: 9.3
Chloride: 104
Chloride: 105
Creatinine, Ser: 0.69
Creatinine, Ser: 0.74
GFR calc Af Amer: >60
GFR calc Af Amer: >60
GFR calc non Af Amer: >60
GFR calc non Af Amer: >60
Glucose, Bld: 114 — ABNORMAL HIGH
Glucose, Bld: 124 — ABNORMAL HIGH
Potassium: 3.9
Potassium: 4.6
Sodium: 137
Sodium: 140

## 2011-02-01 LAB — DIFFERENTIAL
Basophils Absolute: 0
Basophils Relative: 0
Eosinophils Absolute: 0.2
Eosinophils Relative: 1
Lymphocytes Relative: 16
Lymphs Abs: 2.1
Monocytes Absolute: 1 — ABNORMAL HIGH
Monocytes Relative: 7
Neutro Abs: 9.8 — ABNORMAL HIGH
Neutrophils Relative %: 75

## 2011-02-01 LAB — HEMOGLOBIN A1C: Hgb A1c MFr Bld: 7.1 — ABNORMAL HIGH

## 2011-02-13 ENCOUNTER — Observation Stay (HOSPITAL_COMMUNITY)
Admission: EM | Admit: 2011-02-13 | Discharge: 2011-02-14 | Disposition: A | Discharge status: Home / Self Care | Payer: BC Managed Care – PPO | Attending: Emergency Medicine | Admitting: Emergency Medicine

## 2011-02-13 DIAGNOSIS — L02419 Cutaneous abscess of limb, unspecified: Principal | ICD-10-CM | POA: Insufficient documentation

## 2011-02-13 DIAGNOSIS — L03119 Cellulitis of unspecified part of limb: Secondary | ICD-10-CM | POA: Insufficient documentation

## 2011-02-13 LAB — GLUCOSE, CAPILLARY: Glucose-Capillary: 184 mg/dL — ABNORMAL HIGH (ref 70–99)

## 2011-02-16 ENCOUNTER — Emergency Department (HOSPITAL_BASED_OUTPATIENT_CLINIC_OR_DEPARTMENT_OTHER)
Admission: EM | Admit: 2011-02-16 | Discharge: 2011-02-17 | Disposition: A | Discharge status: Other Acute Inpatient Hospital | Payer: BC Managed Care – PPO | Source: Home / Self Care | Attending: Emergency Medicine | Admitting: Emergency Medicine

## 2011-02-16 ENCOUNTER — Encounter (HOSPITAL_BASED_OUTPATIENT_CLINIC_OR_DEPARTMENT_OTHER): Payer: Self-pay

## 2011-02-16 DIAGNOSIS — L03115 Cellulitis of right lower limb: Secondary | ICD-10-CM

## 2011-02-16 DIAGNOSIS — L02419 Cutaneous abscess of limb, unspecified: Secondary | ICD-10-CM | POA: Insufficient documentation

## 2011-02-16 DIAGNOSIS — L03119 Cellulitis of unspecified part of limb: Secondary | ICD-10-CM | POA: Insufficient documentation

## 2011-02-16 DIAGNOSIS — F17200 Nicotine dependence, unspecified, uncomplicated: Secondary | ICD-10-CM | POA: Insufficient documentation

## 2011-02-16 DIAGNOSIS — R21 Rash and other nonspecific skin eruption: Secondary | ICD-10-CM | POA: Insufficient documentation

## 2011-02-16 DIAGNOSIS — E119 Type 2 diabetes mellitus without complications: Secondary | ICD-10-CM | POA: Insufficient documentation

## 2011-02-16 LAB — BASIC METABOLIC PANEL
BUN: 12 mg/dL (ref 6–23)
CO2: 26 mEq/L (ref 19–32)
Calcium: 9.2 mg/dL (ref 8.4–10.5)
Chloride: 103 mEq/L (ref 96–112)
Creatinine, Ser: 0.7 mg/dL (ref 0.50–1.10)
GFR calc Af Amer: >90 mL/min (ref >90)
GFR calc non Af Amer: >90 mL/min (ref >90)
Glucose, Bld: 254 mg/dL — ABNORMAL HIGH (ref 70–99)
Potassium: 4.2 mEq/L (ref 3.5–5.1)
Sodium: 138 mEq/L (ref 135–145)

## 2011-02-16 LAB — DIFFERENTIAL
Basophils Absolute: 0 10*3/uL (ref 0.0–0.1)
Basophils Relative: 0 % (ref 0–1)
Eosinophils Absolute: 0.4 10*3/uL (ref 0.0–0.7)
Eosinophils Relative: 4 % (ref 0–5)
Lymphocytes Relative: 26 % (ref 12–46)
Lymphs Abs: 2.6 10*3/uL (ref 0.7–4.0)
Monocytes Absolute: 0.7 10*3/uL (ref 0.1–1.0)
Monocytes Relative: 7 % (ref 3–12)
Neutro Abs: 6.6 10*3/uL (ref 1.7–7.7)
Neutrophils Relative %: 64 % (ref 43–77)

## 2011-02-16 LAB — CBC
HCT: 34.3 % — ABNORMAL LOW (ref 36.0–46.0)
Hemoglobin: 11 g/dL — ABNORMAL LOW (ref 12.0–15.0)
MCH: 25.2 pg — ABNORMAL LOW (ref 26.0–34.0)
MCHC: 32.1 g/dL (ref 30.0–36.0)
MCV: 78.5 fL (ref 78.0–100.0)
Platelets: 378 10*3/uL (ref 150–400)
RBC: 4.37 MIL/uL (ref 3.87–5.11)
RDW: 16 % — ABNORMAL HIGH (ref 11.5–15.5)
WBC: 10.3 10*3/uL (ref 4.0–10.5)

## 2011-02-16 MED ORDER — ONDANSETRON HCL 4 MG/2ML IJ SOLN
4.0000 mg | Freq: Once | INTRAMUSCULAR | Status: AC
  Administered 2011-02-16: 4 mg via INTRAVENOUS
  Filled 2011-02-16: qty 2

## 2011-02-16 MED ORDER — CIPROFLOXACIN IN D5W 400 MG/200ML IV SOLN
400.0000 mg | Freq: Once | INTRAVENOUS | Status: AC
  Administered 2011-02-16: 400 mg via INTRAVENOUS
  Filled 2011-02-16: qty 200

## 2011-02-16 MED ORDER — SODIUM CHLORIDE 0.9 % IV BOLUS (SEPSIS)
1000.0000 mL | Freq: Once | INTRAVENOUS | Status: AC
  Administered 2011-02-16: 1000 mL via INTRAVENOUS

## 2011-02-16 MED ORDER — MORPHINE SULFATE 4 MG/ML IJ SOLN
4.0000 mg | Freq: Once | INTRAMUSCULAR | Status: AC
  Administered 2011-02-16: 4 mg via INTRAVENOUS
  Filled 2011-02-16: qty 1

## 2011-02-16 MED ORDER — VANCOMYCIN HCL IN DEXTROSE 1-5 GM/200ML-% IV SOLN
1000.0000 mg | Freq: Once | INTRAVENOUS | Status: AC
  Administered 2011-02-16: 1000 mg via INTRAVENOUS
  Filled 2011-02-16: qty 200

## 2011-02-16 NOTE — ED Notes (Signed)
Per Pt, Pt has cellulitis to right leg.  Started Sunday night and was seen at [facility].  Pt was started on antibiotic.  Pt is currently taking med.  Pt leg is red/warm/swollen and painful.

## 2011-02-16 NOTE — ED Notes (Signed)
cbg 206 insulin drip decreased to 1.5 units

## 2011-02-17 ENCOUNTER — Inpatient Hospital Stay (HOSPITAL_COMMUNITY)
Admission: EM | Admit: 2011-02-17 | Discharge: 2011-02-22 | DRG: 277 | Disposition: A | Discharge status: Home / Self Care | Payer: BC Managed Care – PPO | Source: Other Acute Inpatient Hospital | Attending: Family Medicine | Admitting: Family Medicine

## 2011-02-17 DIAGNOSIS — L02419 Cutaneous abscess of limb, unspecified: Principal | ICD-10-CM | POA: Diagnosis present

## 2011-02-17 DIAGNOSIS — E1165 Type 2 diabetes mellitus with hyperglycemia: Secondary | ICD-10-CM

## 2011-02-17 DIAGNOSIS — E039 Hypothyroidism, unspecified: Secondary | ICD-10-CM | POA: Diagnosis present

## 2011-02-17 DIAGNOSIS — IMO0001 Reserved for inherently not codable concepts without codable children: Secondary | ICD-10-CM | POA: Diagnosis present

## 2011-02-17 DIAGNOSIS — L03119 Cellulitis of unspecified part of limb: Principal | ICD-10-CM | POA: Diagnosis present

## 2011-02-17 LAB — HEMOGLOBIN A1C
Hgb A1c MFr Bld: 9.4 % — ABNORMAL HIGH (ref <5.7)
Mean Plasma Glucose: 223 mg/dL — ABNORMAL HIGH (ref <117)

## 2011-02-17 LAB — GLUCOSE, CAPILLARY
Glucose-Capillary: 218 mg/dL — ABNORMAL HIGH (ref 70–99)
Glucose-Capillary: 237 mg/dL — ABNORMAL HIGH (ref 70–99)
Glucose-Capillary: 189 mg/dL — ABNORMAL HIGH (ref 70–99)
Glucose-Capillary: 143 mg/dL — ABNORMAL HIGH (ref 70–99)

## 2011-02-17 LAB — TSH: TSH: 2.876 u[IU]/mL (ref 0.350–4.500)

## 2011-02-17 LAB — PREGNANCY, URINE: Preg Test, Ur: NEGATIVE

## 2011-02-17 NOTE — ED Provider Notes (Signed)
History     CSN: [id number] Arrival date & time: 02/16/2011  9:26 PM   First MD Initiated Contact with Patient 02/16/11 2133      Chief Complaint  Patient presents with  . Recurrent Skin Infections    (Consider location/radiation/quality/duration/timing/severity/associated sxs/prior treatment) Patient is a 40 y.o. female presenting with fever. The history is provided by the patient.  Fever Primary symptoms of the febrile illness include fever, myalgias and rash. Primary symptoms do not include headaches, shortness of breath, abdominal pain or dysuria. The current episode started today. This is a recurrent problem.  The onset of the illness is associated with recent antibiotic use (Right leg redness pain and swelling). Risk factors: Obesity and recurrent cellulitis.  Patient was seen in the emergency department 3 days ago placed on clindamycin. At time she received IV clindamycin and stayed overnight in the hospital. She was doing better was discharged home. She states that she is taking all medications as prescribed. Today she developed a fever to 101.2 , nausea and vomiting. She has long-standing history of recurrent cellulitis right lower extremity feels like it is getting worse again despite taking all her medications. No cough or chest pain or shortness of breath. No abdominal pain or diarrhea.  Past Medical History  Diagnosis Date  . Diabetes mellitus     Past Surgical History  Procedure Date  . Cholecystectomy   . Knee arthroscopy     No family history on file.  History  Substance Use Topics  . Smoking status: Current Everyday Smoker -- 0.5 packs/day  . Smokeless tobacco: Not on file  . Alcohol Use: Yes    OB History    Grav Para Term Preterm Abortions TAB SAB Ect Mult Living                  Review of Systems  Constitutional: Positive for fever. Negative for chills.  HENT: Negative for neck pain and neck stiffness.   Eyes: Negative for pain.  Respiratory:  Negative for shortness of breath.   Cardiovascular: Negative for chest pain.  Gastrointestinal: Negative for abdominal pain.  Genitourinary: Negative for dysuria.  Musculoskeletal: Positive for myalgias. Negative for back pain and joint swelling.  Skin: Positive for rash and wound.  Neurological: Negative for headaches.  All other systems reviewed and are negative.    Allergies  Review of patient's allergies indicates no known allergies.  Home Medications   Current Outpatient Rx  Name Route Sig Dispense Refill  . CLINDAMYCIN HCL 150 MG PO CAPS Oral Take 600 mg by mouth 3 (three) times daily.      Marland Kitchen OMEPRAZOLE 10 MG PO CPDR Oral Take 10 mg by mouth daily.      Marland Kitchen OMEPRAZOLE PO Oral Take by mouth.        BP 117/50  Pulse 105  Temp(Src) 97.8 F (36.6 C) (Oral)  Resp 20  Ht 5\' 1"  (1.549 m)  Wt 348 lb (157.852 kg)  BMI 65.75 kg/m2  SpO2 100%  LMP 01/26/2011  Physical Exam  Constitutional: She is oriented to person, place, and time. She appears well-developed and well-nourished.  HENT:  Head: Normocephalic and atraumatic.  Eyes: Conjunctivae and EOM are normal. Pupils are equal, round, and reactive to light.  Neck: Trachea normal. Neck supple. No thyromegaly present.  Cardiovascular: Normal rate, regular rhythm, S1 normal, S2 normal and normal pulses.     No systolic murmur is present   No diastolic murmur is present  Pulses:  Radial pulses are 2+ on the right side, and 2+ on the left side.  Pulmonary/Chest: Effort normal and breath sounds normal. She has no wheezes. She has no rhonchi. She has no rales. She exhibits no tenderness.  Abdominal: Soft. Normal appearance and bowel sounds are normal. There is no tenderness. There is no CVA tenderness and negative Murphy's sign.  Musculoskeletal:       BLE:s Calves nontender, no cords or erythema, negative Homans sign  Neurological: She is alert and oriented to person, place, and time. She has normal strength. No cranial  nerve deficit or sensory deficit. GCS eye subscore is 4. GCS verbal subscore is 5. GCS motor subscore is 6.  Skin: Skin is warm and dry. She is not diaphoretic.       Right lower extremity: Erythema, warmth and tenderness to palpation over the lower leg with distal neurovascular intact. No open wounds. No tender lymphadenopathy. No streaking lymphangitis. Outline dated 10/28 still present and erythema is borders of this ink outline  Psychiatric: Her speech is normal.       Cooperative and appropriate    ED Course  Procedures (including critical care time)  Results for orders placed during the hospital encounter of 02/16/11  CBC      Component Value Range   WBC 10.3  4.0 - 10.5 (K/uL)   RBC 4.37  3.87 - 5.11 (MIL/uL)   Hemoglobin 11.0 (*) 12.0 - 15.0 (g/dL)   HCT 16.1 (*) 09.6 - 46.0 (%)   MCV 78.5  78.0 - 100.0 (fL)   MCH 25.2 (*) 26.0 - 34.0 (pg)   MCHC 32.1  30.0 - 36.0 (g/dL)   RDW 04.5 (*) 40.9 - 15.5 (%)   Platelets 378  150 - 400 (K/uL)  DIFFERENTIAL      Component Value Range   Neutrophils Relative 64  43 - 77 (%)   Neutro Abs 6.6  1.7 - 7.7 (K/uL)   Lymphocytes Relative 26  12 - 46 (%)   Lymphs Abs 2.6  0.7 - 4.0 (K/uL)   Monocytes Relative 7  3 - 12 (%)   Monocytes Absolute 0.7  0.1 - 1.0 (K/uL)   Eosinophils Relative 4  0 - 5 (%)   Eosinophils Absolute 0.4  0.0 - 0.7 (K/uL)   Basophils Relative 0  0 - 1 (%)   Basophils Absolute 0.0  0.0 - 0.1 (K/uL)  BASIC METABOLIC PANEL      Component Value Range   Sodium 138  135 - 145 (mEq/L)   Potassium 4.2  3.5 - 5.1 (mEq/L)   Chloride 103  96 - 112 (mEq/L)   CO2 26  19 - 32 (mEq/L)   Glucose, Bld 254 (*) 70 - 99 (mg/dL)   BUN 12  6 - 23 (mg/dL)   Creatinine, Ser 8.11  0.50 - 1.10 (mg/dL)   Calcium 9.2  8.4 - 91.4 (mg/dL)   GFR calc non Af Amer >90  >90 (mL/min)   GFR calc Af Amer >90  >90 (mL/min)   No results found.    MDM   Recurrent cellulitis right lower extremity. Patient failed outpatient treatment. IV  vancomycin initiated. Labs obtained and reviewed as above. Medicine consult obtained. I discussed case with Dr. Houston Siren and he accepts patient in transfer for admit at [facility]. Requested I start IV Cipro in addition to vancomycin. IV fluids provided mild tachycardia. Patient stabilized for transfer and admission.        Sunnie Nielsen, MD  02/17/11 0031 

## 2011-02-18 LAB — CBC
HCT: 33.5 % — ABNORMAL LOW (ref 36.0–46.0)
Hemoglobin: 10.4 g/dL — ABNORMAL LOW (ref 12.0–15.0)
MCH: 25.1 pg — ABNORMAL LOW (ref 26.0–34.0)
MCHC: 31 g/dL (ref 30.0–36.0)
MCV: 80.7 fL (ref 78.0–100.0)
Platelets: 340 10*3/uL (ref 150–400)
RBC: 4.15 MIL/uL (ref 3.87–5.11)
RDW: 15.9 % — ABNORMAL HIGH (ref 11.5–15.5)
WBC: 8.6 10*3/uL (ref 4.0–10.5)

## 2011-02-18 LAB — GLUCOSE, CAPILLARY
Glucose-Capillary: 165 mg/dL — ABNORMAL HIGH (ref 70–99)
Glucose-Capillary: 167 mg/dL — ABNORMAL HIGH (ref 70–99)
Glucose-Capillary: 170 mg/dL — ABNORMAL HIGH (ref 70–99)
Glucose-Capillary: 151 mg/dL — ABNORMAL HIGH (ref 70–99)

## 2011-02-18 LAB — BASIC METABOLIC PANEL
BUN: 8 mg/dL (ref 6–23)
CO2: 25 mEq/L (ref 19–32)
Calcium: 8.5 mg/dL (ref 8.4–10.5)
Chloride: 107 mEq/L (ref 96–112)
Creatinine, Ser: 0.74 mg/dL (ref 0.50–1.10)
GFR calc Af Amer: >90 mL/min (ref >90)
GFR calc non Af Amer: >90 mL/min (ref >90)
Glucose, Bld: 165 mg/dL — ABNORMAL HIGH (ref 70–99)
Potassium: 4.2 mEq/L (ref 3.5–5.1)
Sodium: 139 mEq/L (ref 135–145)

## 2011-02-18 MED ORDER — LIVING WELL WITH DIABETES BOOK: Freq: Once | Status: DC

## 2011-02-18 NOTE — H&P (Signed)
  NAMEYELENA, Peterson NO.:  [id number]  MEDICAL RECORD NO.:  [id number]  LOCATION:  5007                         FACILITY:  [facility]  PHYSICIAN:  Houston Siren, MD           DATE OF BIRTH:  1971-02-24  DATE OF ADMISSION:  02/17/2011 DATE OF DISCHARGE:                             HISTORY & PHYSICAL   REASON FOR ADMISSION:  Right lower extremity cellulitis.  ADVANCED DIRECTIVE:  Full code.  HISTORY OF PRESENT ILLNESS:  This is a 40 year old female with morbid obesity, hypothyroidism, recent diagnosis of type 2 diabetes, had a right lower extremity cellulitis, treated in the emergency room with intravenous clindamycin with some improvement, but then had her redness, swelling and pain, came back and progressed.  She had pain in her right lower extremity, but did not have any fever or chills.  She has no systemic complaints such as nausea, vomiting, diaphoresis, headache, shortness of breath, or chest pain.  Evaluation at [facility] showed a normal white count of 10,000, and a creatinine of 0.7.  She was given vancomycin and Cipro, and Hospitalist was asked to admit to her for failed outpatient treatment of her cellulitis.  PAST MEDICAL HISTORY: 1. Morbid obesity. 2. Hypothyroidism. 3. Diabetes type 2. 4. Prior lower extremity cellulitis.  SOCIAL HISTORY:  She denies tobacco, alcohol, or drug use.  She is married with children.  FAMILY HISTORY:  Noncontributory.  ALLERGIES:  No known drug allergies.  CURRENT MEDICATIONS:  Prilosec 10 mg per day, otherwise none.  PHYSICAL EXAMINATION:  VITAL SIGNS:  She is afebrile, blood pressure 118/60, pulse of 114. GENERAL:  She is morbidly obese, alert and oriented, answers questions appropriately. HEENT:  Sclerae are nonicteric. NECK:  Supple.  No stridor.  No lymphadenopathy. CARDIAC:  S1, S2 regular.  I did not hear any murmur, rub, or gallop. LUNGS:  Clear. ABDOMEN:  Obese, nondistended,  nontender.  No palpable mass. EXTREMITIES:  Swelling of the right lower extremity with erythema and tender and tenderness, but with adequate distal perfusion. NEUROLOGICAL/PSYCHIATRIC:  Unremarkable.  OBJECTIVE FINDINGS:  Pertinent findings are serum sodium 138, potassium 4.2, glucose of 254, creatinine 0.7.  IMPRESSION:  This is a 40 year old diabetic female, obese on Depo- Provera with leg lymphedema, admitted for failed outpatient cellulitis. I will treat her with vancomycin and Cipro.  We will start insulin sliding scale.  She will need some pain medication as well.  We will continue her medication when reconciled.  We will check a TSH.  She is stable, full code, and will be admitted to [facility] Team 3.     Houston Siren, MD     PL/MEDQ  D:  02/17/2011  T:  02/17/2011  Job:  [id number]  Electronically Signed by Houston Siren  on 02/18/2011 04:05:26 AM

## 2011-02-19 LAB — BASIC METABOLIC PANEL
BUN: 7 mg/dL (ref 6–23)
CO2: 23 mEq/L (ref 19–32)
Calcium: 8.8 mg/dL (ref 8.4–10.5)
Chloride: 106 mEq/L (ref 96–112)
Creatinine, Ser: 0.78 mg/dL (ref 0.50–1.10)
GFR calc Af Amer: >90 mL/min (ref >90)
GFR calc non Af Amer: >90 mL/min (ref >90)
Glucose, Bld: 155 mg/dL — ABNORMAL HIGH (ref 70–99)
Potassium: 4.1 mEq/L (ref 3.5–5.1)
Sodium: 139 mEq/L (ref 135–145)

## 2011-02-19 LAB — CBC
HCT: 33.6 % — ABNORMAL LOW (ref 36.0–46.0)
Hemoglobin: 10.7 g/dL — ABNORMAL LOW (ref 12.0–15.0)
MCH: 25.7 pg — ABNORMAL LOW (ref 26.0–34.0)
MCHC: 31.8 g/dL (ref 30.0–36.0)
MCV: 80.6 fL (ref 78.0–100.0)
Platelets: 361 10*3/uL (ref 150–400)
RBC: 4.17 MIL/uL (ref 3.87–5.11)
RDW: 16.1 % — ABNORMAL HIGH (ref 11.5–15.5)
WBC: 8.6 10*3/uL (ref 4.0–10.5)

## 2011-02-19 LAB — GLUCOSE, CAPILLARY
Glucose-Capillary: 157 mg/dL — ABNORMAL HIGH (ref 70–99)
Glucose-Capillary: 142 mg/dL — ABNORMAL HIGH (ref 70–99)
Glucose-Capillary: 117 mg/dL — ABNORMAL HIGH (ref 70–99)

## 2011-02-19 MED ORDER — HYDROMORPHONE HCL PF 1 MG/ML IJ SOLN: 0.5000 mg | INTRAMUSCULAR | Status: DC | PRN

## 2011-02-19 MED ORDER — ENOXAPARIN SODIUM 80 MG/0.8ML ~~LOC~~ SOLN
70.0000 mg | SUBCUTANEOUS | Status: DC
  Administered 2011-02-20 – 2011-02-22 (×3): 70 mg via SUBCUTANEOUS
  Filled 2011-02-19 (×4): qty 0.8

## 2011-02-19 MED ORDER — PANTOPRAZOLE SODIUM 40 MG PO TBEC
40.0000 mg | DELAYED_RELEASE_TABLET | Freq: Every day | ORAL | Status: DC
  Administered 2011-02-20 – 2011-02-22 (×3): 40 mg via ORAL
  Filled 2011-02-19: qty 1

## 2011-02-19 MED ORDER — INSULIN ASPART 100 UNIT/ML ~~LOC~~ SOLN
6.0000 [IU] | Freq: Three times a day (TID) | SUBCUTANEOUS | Status: DC
  Administered 2011-02-20 – 2011-02-22 (×6): 6 [IU] via SUBCUTANEOUS
  Filled 2011-02-19: qty 3

## 2011-02-19 MED ORDER — VANCOMYCIN HCL 1000 MG IV SOLR
1500.0000 mg | Freq: Two times a day (BID) | INTRAVENOUS | Status: DC
  Administered 2011-02-20 – 2011-02-22 (×5): 1500 mg via INTRAVENOUS
  Filled 2011-02-19 (×6): qty 1500

## 2011-02-19 MED ORDER — GLIPIZIDE 5 MG PO TABS
5.0000 mg | ORAL_TABLET | Freq: Every day | ORAL | Status: DC
  Administered 2011-02-20 – 2011-02-22 (×3): 5 mg via ORAL
  Filled 2011-02-19 (×4): qty 1

## 2011-02-19 MED ORDER — VANCOMYCIN HCL IN DEXTROSE 1-5 GM/200ML-% IV SOLN
1000.0000 mg | Freq: Three times a day (TID) | INTRAVENOUS | Status: DC
  Filled 2011-02-19 (×3): qty 200

## 2011-02-19 MED ORDER — SODIUM CHLORIDE 0.9 % IJ SOLN
3.0000 mL | Freq: Two times a day (BID) | INTRAMUSCULAR | Status: DC
  Administered 2011-02-20 – 2011-02-21 (×3): 3 mL via INTRAVENOUS

## 2011-02-19 MED ORDER — PROMETHAZINE HCL 12.5 MG PO TABS: 12.5000 mg | ORAL_TABLET | ORAL | Status: DC | PRN

## 2011-02-19 MED ORDER — ONDANSETRON HCL 4 MG PO TABS: 4.0000 mg | ORAL_TABLET | Freq: Four times a day (QID) | ORAL | Status: DC | PRN

## 2011-02-19 MED ORDER — ACETAMINOPHEN 325 MG PO TABS: 650.0000 mg | ORAL_TABLET | ORAL | Status: DC | PRN

## 2011-02-19 MED ORDER — INSULIN ASPART 100 UNIT/ML ~~LOC~~ SOLN
0.0000 [IU] | Freq: Three times a day (TID) | SUBCUTANEOUS | Status: DC
  Administered 2011-02-20 – 2011-02-22 (×8): 3 [IU] via SUBCUTANEOUS
  Filled 2011-02-19: qty 3

## 2011-02-19 MED ORDER — INSULIN ASPART 100 UNIT/ML ~~LOC~~ SOLN
0.0000 [IU] | Freq: Every day | SUBCUTANEOUS | Status: DC
  Filled 2011-02-19: qty 3

## 2011-02-19 MED ORDER — PIPERACILLIN-TAZOBACTAM 3.375 G IVPB
3.3750 g | Freq: Three times a day (TID) | INTRAVENOUS | Status: DC
  Administered 2011-02-19 – 2011-02-22 (×9): 3.375 g via INTRAVENOUS
  Filled 2011-02-19 (×10): qty 50

## 2011-02-19 MED ORDER — SULFAMETHOXAZOLE-TMP DS 800-160 MG PO TABS
2.0000 | ORAL_TABLET | Freq: Two times a day (BID) | ORAL | Status: DC
  Filled 2011-02-19 (×4): qty 2

## 2011-02-19 MED ORDER — ONDANSETRON HCL 4 MG/2ML IJ SOLN: 4.0000 mg | Freq: Four times a day (QID) | INTRAMUSCULAR | Status: DC | PRN

## 2011-02-19 MED ORDER — ENOXAPARIN SODIUM 40 MG/0.4ML ~~LOC~~ SOLN
40.0000 mg | SUBCUTANEOUS | Status: DC
  Filled 2011-02-19 (×2): qty 0.4

## 2011-02-19 NOTE — Progress Notes (Signed)
  [name], Peterson NO.:  [id number]  MEDICAL RECORD NO.:  [id number]  LOCATION:  5007                         FACILITY:  [facility]  PHYSICIAN:  Brendia Sacks, MD    DATE OF BIRTH:  Mar 02, 1971                                PROGRESS NOTE   BRIEF NARRATIVE: This is a 40 year old woman who was recently seen at the [facility] emergency room for right lower extremity cellulitis.  She received several IV infusions of clindamycin with improvement and was discharged home on oral clindamycin.  However, she was unable to tolerate this medication secondary to nausea and vomiting, and so presented back for further evaluation and treatment.  She was then admitted for inpatient treatment for cellulitis.  ASSESSMENT AND PLAN: 1. Right lower extremity cellulitis.  This improved on IV Cipro and     vancomycin and continues to be improved at this time. 2. Left lower extremity cellulitis.  While bilateral cellulitis is     quite uncommon, this is certainly not dependent rubor and it is not     symmetric in nature.  The area of erythema and edema of the left     lower extremity increased while on ciprofloxacin alone and seemed     to increase off vancomycin.  Therefore, vancomycin was reinstituted     and Zosyn was added.  Since that time, she has had decreased     erythema on the left lower extremity. 3. New diagnosis of diabetes mellitus, type 2,  not on any outpatient     medications.  Uncontrolled by hemoglobin A1c but stable capillary     blood sugars during this hospitalization.  She will continue on     glipizide.  Start metformin on discharge.  She really has an     outpatient appointment with Dr. Talmage Nap at the end of the month. 4. Morbid obesity.  She does want to lose weight. Anticipate discharge, February 21, 2011, if her condition continues to improve.  May need to consider outpatient IV antibiotic therapy and PICC line therapy.  We will reassess in the  morning.     Brendia Sacks, MD     DG/MEDQ  D:  02/19/2011  T:  02/19/2011  Job:  [id number]  Electronically Signed by Brendia Sacks  on 02/19/2011 05:23:52 PM

## 2011-02-20 LAB — GLUCOSE, CAPILLARY
Glucose-Capillary: 139 mg/dL — ABNORMAL HIGH (ref 70–99)
Glucose-Capillary: 127 mg/dL — ABNORMAL HIGH (ref 70–99)
Glucose-Capillary: 155 mg/dL — ABNORMAL HIGH (ref 70–99)
Glucose-Capillary: 129 mg/dL — ABNORMAL HIGH (ref 70–99)
Glucose-Capillary: 144 mg/dL — ABNORMAL HIGH (ref 70–99)

## 2011-02-20 NOTE — Progress Notes (Signed)
PROGRESS NOTE  [name] EXB:[id number] DOB: 05/16/1970 DOA: 02/17/2011  Subjective: She continues to feel better. The erythema on bilateral lower extremities continues to improve. She is pleased with her blood sugar control.  Objective: Filed Vitals:   02/20/11 0600  BP: 124/75  Pulse: 89  Temp: 98.5 F (36.9 C)  Resp: 16    Intake/Output Summary (Last 24 hours) at 02/20/11 1111 Last data filed at 02/19/11 1900  Gross per 24 hour  Intake   1289 ml  Output      3 ml  Net   1286 ml    Exam: General: Appears well. Sitting in a chair. Cardiovascular: Regular rate and rhythm no murmur or gallop. Respiratory: Clear to auscultation bilaterally. No wheezes rales or rhonchi. Normal respiratory effort. Skin: The right lower extremity continues to improve with near complete resolution of erythema. No fluctuance or pain. The left lower extremity also continues to improve with near resolution of erythema. Both legs show continued improvement with receding erythema.   BRIEF NARRATIVE:  This is a 40 year old woman who was recently seen at the [facility] emergency room for right lower extremity cellulitis. She received several IV infusions of clindamycin with improvement and was discharged home on oral clindamycin. However, she was unable to tolerate this medication secondary to nausea and vomiting, and so presented back for further evaluation and treatment. She was then admitted for inpatient treatment for cellulitis.   Assessment/Plan: 1. Right lower extremity cellulitis. This improved on IV Cipro and vancomycin and continues to be improved at this time on current antibiotic therapy..  2. Left lower extremity cellulitis. While bilateral cellulitis is quite uncommon, this is certainly not dependent rubor and it is not symmetric in nature. The area of erythema and edema of the left lower extremity increased while on ciprofloxacin alone and seemed to increase off vancomycin. Therefore,  vancomycin was reinstituted and Zosyn was added. Since that time, she has had decreased erythema on the left lower extremity. Will place a PICC line and plan to complete a total of 8 days of IV antibiotics. 3. New diagnosis of diabetes mellitus, type 2, not on any outpatient medications. Uncontrolled by hemoglobin A1c but stable capillary blood sugars during this hospitalization. She will continue on glipizide. Start metformin on discharge. She already has an outpatient appointment with Dr. Talmage Nap at the end of the month.  4. Morbid obesity. She does want to lose weight.   Code Status: Full. Family Communication: Spoke with her husband at the bedside yesterday. All questions answered to his satisfaction. Disposition Plan: Home with IV antibiotics one to 2 days. Will return to work November 12.    PCP: No primary provider on file. Endocrinologist: Dr. Talmage Nap   LOS: 3 days   Pandora Leiter MD   Data Reviewed: Results for orders placed during the hospital encounter of 02/17/11 (from the past 24 hour(s))  GLUCOSE, CAPILLARY     Status: Abnormal   Collection Time   02/19/11  4:27 PM      Component Value Range   Glucose-Capillary 142 (*) 70 - 99 (mg/dL)  GLUCOSE, CAPILLARY     Status: Abnormal   Collection Time   02/19/11  9:42 PM      Component Value Range   Glucose-Capillary 117 (*) 70 - 99 (mg/dL)   Comment 1 Documented in Chart     Comment 2 Notify RN    GLUCOSE, CAPILLARY     Status: Abnormal   Collection Time   02/20/11  7:17 AM      Component Value Range   Glucose-Capillary 139 (*) 70 - 99 (mg/dL)   Comment 1 Documented in Chart     Comment 2 Notify RN     Scheduled Meds:   . enoxaparin (LOVENOX) injection  70 mg Subcutaneous Q24H  . glipiZIDE  5 mg Oral QAC breakfast  . insulin aspart  0-20 Units Subcutaneous TID WC  . insulin aspart  0-5 Units Subcutaneous QHS  . insulin aspart  6 Units Subcutaneous TID WC  . pantoprazole  40 mg Oral Q1200  . piperacillin-tazobactam  (ZOSYN)  IV  3.375 g Intravenous Q8H  . sodium chloride  3 mL Intravenous Q12H  . vancomycin  1,500 mg Intravenous Q12H  . DISCONTD: enoxaparin (LOVENOX) injection  40 mg Subcutaneous Q24H  . DISCONTD: sulfamethoxazole-trimethoprim  2 tablet Oral Q12H  . DISCONTD: vancomycin  1,000 mg Intravenous Q8H   Day 2 IV vancomycin and day 2 IV Zosyn.

## 2011-02-21 LAB — BASIC METABOLIC PANEL
BUN: 8 mg/dL (ref 6–23)
CO2: 20 mEq/L (ref 19–32)
Calcium: 9 mg/dL (ref 8.4–10.5)
Chloride: 105 mEq/L (ref 96–112)
Creatinine, Ser: 0.63 mg/dL (ref 0.50–1.10)
GFR calc Af Amer: >90 mL/min (ref >90)
GFR calc non Af Amer: >90 mL/min (ref >90)
Glucose, Bld: 157 mg/dL — ABNORMAL HIGH (ref 70–99)
Potassium: 3.9 mEq/L (ref 3.5–5.1)
Sodium: 136 mEq/L (ref 135–145)

## 2011-02-21 LAB — GLUCOSE, CAPILLARY
Glucose-Capillary: 141 mg/dL — ABNORMAL HIGH (ref 70–99)
Glucose-Capillary: 133 mg/dL — ABNORMAL HIGH (ref 70–99)
Glucose-Capillary: 109 mg/dL — ABNORMAL HIGH (ref 70–99)
Glucose-Capillary: 164 mg/dL — ABNORMAL HIGH (ref 70–99)

## 2011-02-21 LAB — VANCOMYCIN, TROUGH: Vancomycin Tr: 11.7 ug/mL (ref 10.0–20.0)

## 2011-02-21 NOTE — Progress Notes (Signed)
PROGRESS NOTE  [name]:[id number] DOB: 17-Jun-1970 DOA: 02/17/2011  Subjective: The patient continues to improve. Her erythema continues to decrease. She is feeling quite well. She is pleased with her blood sugar control.  Objective: Filed Vitals:   02/21/11 0618  BP: 124/72  Pulse: 83  Temp: 97.9 F (36.6 C)  Resp: 20   No intake or output data in the 24 hours ending 02/21/11 1353  Exam: General: Appears well. Sitting in a chair. Cardiovascular: Regular rate and rhythm no murmur or gallop. Respiratory: Clear to auscultation bilaterally. No wheezes rales or rhonchi. Normal respiratory effort. Skin: The right lower extremity continues to improve with near complete resolution of erythema. No fluctuance or pain. The left lower extremity also continues to improve with near resolution of erythema. Both legs show continued improvement with receding erythema.  Exam current for November 5.   BRIEF NARRATIVE:  This is a 40 year old woman who was recently seen at the [facility] emergency room for right lower extremity cellulitis. She received several IV infusions of clindamycin with improvement and was discharged home on oral clindamycin. However, she was unable to tolerate this medication secondary to nausea and vomiting, and so presented back for further evaluation and treatment. She was then admitted for inpatient treatment for cellulitis.   Assessment/Plan: 1. Right lower extremity cellulitis. This improved on IV Cipro and vancomycin and continues to improve at this time on current antibiotic therapy..  2. Left lower extremity cellulitis. Continues to improve. While bilateral cellulitis is quite uncommon, this is certainly not dependent rubor and it is not symmetric in nature. The area of erythema and edema of the left lower extremity increased while on ciprofloxacin alone and seemed to increase off vancomycin. Therefore, vancomycin was reinstituted and Zosyn was added. Since  that time, she has had decreased erythema on the left lower extremity. Will place a PICC line and plan to complete a total of 8 days of IV antibiotics. 3. New diagnosis of diabetes mellitus, type 2, not on any outpatient medications. Uncontrolled by hemoglobin A1c but stable capillary blood sugars during this hospitalization. She will continue on glipizide. Start metformin on discharge. She already has an outpatient appointment with Dr. Talmage Nap at the end of the month.  4. Morbid obesity. She does want to lose weight.   Code Status: Full. Family Communication: Spoke with her husband at the bedside yesterday. All questions answered to his satisfaction. Disposition Plan: Home with IV antibiotics November 6. Will return to work November 12.    PCP: No primary provider on file. Endocrinologist: Dr. Talmage Nap   LOS: 4 days   Pandora Leiter MD   Data Reviewed: Results for orders placed during the hospital encounter of 02/17/11 (from the past 24 hour(s))  GLUCOSE, CAPILLARY     Status: Abnormal   Collection Time   02/20/11  4:34 PM      Component Value Range   Glucose-Capillary 129 (*) 70 - 99 (mg/dL)  GLUCOSE, CAPILLARY     Status: Abnormal   Collection Time   02/20/11 10:05 PM      Component Value Range   Glucose-Capillary 144 (*) 70 - 99 (mg/dL)   Comment 1 Documented in Chart     Comment 2 Notify RN    GLUCOSE, CAPILLARY     Status: Abnormal   Collection Time   02/21/11  6:20 AM      Component Value Range   Glucose-Capillary 141 (*) 70 - 99 (mg/dL)   Comment 1 Documented  in Chart     Comment 2 Notify RN    BASIC METABOLIC PANEL     Status: Abnormal   Collection Time   02/21/11  7:20 AM      Component Value Range   Sodium 136  135 - 145 (mEq/L)   Potassium 3.9  3.5 - 5.1 (mEq/L)   Chloride 105  96 - 112 (mEq/L)   CO2 20  19 - 32 (mEq/L)   Glucose, Bld 157 (*) 70 - 99 (mg/dL)   BUN 8  6 - 23 (mg/dL)   Creatinine, Ser 1.61  0.50 - 1.10 (mg/dL)   Calcium 9.0  8.4 - 09.6 (mg/dL)     GFR calc non Af Amer >90  >90 (mL/min)   GFR calc Af Amer >90  >90 (mL/min)  GLUCOSE, CAPILLARY     Status: Abnormal   Collection Time   02/21/11 11:02 AM      Component Value Range   Glucose-Capillary 133 (*) 70 - 99 (mg/dL)   Comment 1 Notify RN     Comment 2 Documented in Chart    VANCOMYCIN, TROUGH     Status: Normal   Collection Time   02/21/11 11:32 AM      Component Value Range   Vancomycin Tr 11.7  10.0 - 20.0 (ug/mL)   Scheduled Meds:    . enoxaparin (LOVENOX) injection  70 mg Subcutaneous Q24H  . glipiZIDE  5 mg Oral QAC breakfast  . insulin aspart  0-20 Units Subcutaneous TID WC  . insulin aspart  0-5 Units Subcutaneous QHS  . insulin aspart  6 Units Subcutaneous TID WC  . pantoprazole  40 mg Oral Q1200  . piperacillin-tazobactam (ZOSYN)  IV  3.375 g Intravenous Q8H  . sodium chloride  3 mL Intravenous Q12H  . vancomycin  1,500 mg Intravenous Q12H   Day 2 IV vancomycin and day 2 IV Zosyn.

## 2011-02-21 NOTE — Progress Notes (Signed)
ANTIBIOTIC CONSULT NOTE - FOLLOW UP  Pharmacy Consult for Vancomycin Indication: RLE cellulitis  No Known Allergies  Patient Measurements: Height: 5\' 1"  (154.9 cm) (Entered for Cutover) Weight: 341 lb 0.8 oz (154.7 kg) (Entered for Cutover) IBW/kg (Calculated) : 47.8    Vital Signs: Temp: 97.9 F (36.6 C) (11/05 0618) Temp src: Oral (11/05 0618) BP: 124/72 mmHg (11/05 0618) Pulse Rate: 83  (11/05 0618) Intake/Output from previous day:   Intake/Output from this shift:    Labs:  Basename 02/21/11 0720 02/19/11 0630  WBC -- 8.6  HGB -- 10.7*  PLT -- 361  LABCREA -- --  CREATININE 0.63 0.78   Estimated Creatinine Clearance: 131 ml/min (by C-G formula based on Cr of 0.63).  Basename 02/21/11 1132  VANCOTROUGH 11.7  VANCOPEAK --  VANCORANDOM --  GENTTROUGH --  GENTPEAK --  GENTRANDOM --  TOBRATROUGH --  TOBRAPEAK --  TOBRARND --  AMIKACINPEAK --  AMIKACINTROU --  AMIKACIN --     Microbiology: No results found for this or any previous visit (from the past 720 hour(s)).  Anti-infectives     Start     Dose/Rate Route Frequency Ordered Stop   02/19/11 1400   vancomycin (VANCOCIN) IVPB 1000 mg/200 mL premix  Status:  Discontinued        1,000 mg 200 mL/hr over 60 Minutes Intravenous 3 times per day 02/19/11 1306 02/19/11 1724   02/19/11 1315   sulfamethoxazole-trimethoprim (BACTRIM DS) 800-160 MG per tablet 2 tablet  Status:  Discontinued        2 tablet Oral Every 12 hours 02/19/11 1302 02/20/11 0842          Assessment: 40yof on day #3 Vancomycin for RLE cellulitis. Vancomycin trough is at goal. Renal function stable. Noted plan for 8 days total ABX.  Goal of Therapy:  Vancomycin trough level 10-15 mcg/ml  Plan:  Continue Vancomycin 1.5g IV q12  [name], [name] 02/21/2011,1:06 PM

## 2011-02-21 NOTE — Progress Notes (Signed)
Spoke with patient about HHC for home IV antibiotics, vancomycin and zosyn. She chose [facility] from [place] list. Contacted [facility], they are unable to service case. Contacted patient's second choice [facility]. Spoke with Eunice Blase, they will be able to service the case. Entered request in [facility]. Patient awaiting PICC placement. Jacquelynn Cree RN, BSN, CCM

## 2011-02-22 LAB — GLUCOSE, CAPILLARY
Glucose-Capillary: 150 mg/dL — ABNORMAL HIGH (ref 70–99)
Glucose-Capillary: 130 mg/dL — ABNORMAL HIGH (ref 70–99)

## 2011-02-22 MED ORDER — HEPARIN SOD (PORK) LOCK FLUSH 100 UNIT/ML IV SOLN: 250.0000 [IU] | INTRAVENOUS | Status: DC | PRN

## 2011-02-22 MED ORDER — SODIUM CHLORIDE 0.9 % IJ SOLN: 10.0000 mL | INTRAMUSCULAR | Status: DC | PRN

## 2011-02-22 MED ORDER — SODIUM CHLORIDE 0.9 % IJ SOLN: 10.0000 mL | Freq: Two times a day (BID) | INTRAMUSCULAR | Status: DC

## 2011-02-22 MED ORDER — GLIPIZIDE 5 MG PO TABS: 5.0000 mg | ORAL_TABLET | Freq: Every day | ORAL | Status: DC

## 2011-02-22 MED ORDER — METFORMIN HCL 500 MG PO TABS: 500.0000 mg | ORAL_TABLET | Freq: Two times a day (BID) | ORAL | Status: DC

## 2011-02-22 MED ORDER — VANCOMYCIN HCL 1000 MG IV SOLR: 1500.0000 mg | Freq: Two times a day (BID) | INTRAVENOUS | Status: DC

## 2011-02-22 MED ORDER — PIPERACILLIN-TAZOBACTAM 3.375 G IVPB: 3.3750 g | Freq: Three times a day (TID) | INTRAVENOUS | Status: DC

## 2011-02-22 NOTE — Progress Notes (Signed)
Continues to improve. Plan for discharge today.

## 2011-02-22 NOTE — Discharge Summary (Signed)
Physician Discharge Summary  [name] ION:[id number] DOB: [birth year] DOA: 02/17/2011  PCP: No primary provider on file. Dewain Penning, M.D. Endocrinologist: Dr. Romero Belling  Admit date: 02/17/2011 Discharge date: 02/22/2011  Discharge Diagnoses:   #1: Right lower extreme cellulitis.  #2: Left lower extremity cellulitis. #3: New diagnosis of diabetes mellitus, type II uncontrolled by hemoglobin A1c. #4: Morbid obesity.  Disposition: Home with home health RN for IV antibiotic infusion.  Discharge Condition: Improved  Hospital Course:   This is a 40 year old woman who was recently seen at the [facility] emergency room for right lower extremity cellulitis. She received several IV infusions of clindamycin with improvement and was discharged home on oral clindamycin. However, she was unable to tolerate this medication secondary to nausea and vomiting, and so presented back for further evaluation and treatment. She was then admitted for inpatient treatment for cellulitis.  Initially the right large him he cellulitis improved on Cipro and vancomycin. However she developed a discrete left lower extremity cellulitis and therefore was changed to Zosyn. Note that this cellulitis occurred after vancomycin had been discontinued.  Since the initiation of vancomycin and Zosyn bilateral lower extremity cellulitis continues to improve. She will complete a total of 7 days of IV antibiotics. Given failure to improve on oral Cipro will continue IV antibiotics.  Regard to her diabetes has been well controlled during this hospitalization. She did not want to start insulin. She is followup endocrinology at the end of the month. She continue on 2 oral agents at this time. She was seen by the inpatient diabetic coordinator and nutrition.  Consults: None.  Discharge Exam: Filed Vitals:   02/22/11 0521  BP: 137/69  Pulse: 89  Temp: 98 F (36.7 C)  Resp: 18   patient feels well today. No pain in her lower  legs. General: Well-appearing Lower extremities: Resolution of right lower extremity cellulitis. No pain with palpation no fluctuance no abscess. The left lower short he has some residual erythema. There is no warmth and there is no pain with palpation.  Diabetic diet  Discharge Orders    Future Orders Please Complete By Expires   Diet - low sodium heart healthy      Discharge instructions      Comments:   Keep your followup appointment with Dr. Lisabeth Devoid at the end of the month. Call your physician for worsening leg pain or redness.   Activity as tolerated - No restrictions      Other Restrictions      Comments:   May return to work November 12.   Call MD for:  redness, tenderness, or signs of infection (pain, swelling, redness, odor or green/yellow discharge around incision site)        Current Discharge Medication List    START taking these medications   Details  glipiZIDE (GLUCOTROL) 5 MG tablet Take 1 tablet (5 mg total) by mouth daily before breakfast. Qty: 30 tablet, Refills: 0    metFORMIN (GLUCOPHAGE) 500 MG tablet Take 1 tablet (500 mg total) by mouth 2 (two) times daily with a meal. Qty: 60 tablet, Refills: 0    piperacillin-tazobactam (ZOSYN) 3-0.375 GM/50ML IVPB Inject 50 mLs (3.375 g total) into the vein every 8 (eight) hours.    sodium chloride 0.9 % SOLN 500 mL with vancomycin 1000 MG SOLR 1,500 mg Inject 1,500 mg into the vein every 12 (twelve) hours.      CONTINUE these medications which have NOT CHANGED   Details  omeprazole (PRILOSEC) 10  MG capsule Take 10 mg by mouth every morning.       STOP taking these medications     clindamycin (CLEOCIN) 150 MG capsule        Time coordinating discharge: 35 minutes.  The results of significant diagnostics from this hospitalization (including imaging, microbiology, ancillary and laboratory) are listed below for reference.    Labs: Results for orders placed during the hospital encounter of 02/17/11 (from the past  48 hour(s))  GLUCOSE, CAPILLARY     Status: Abnormal   Collection Time   02/20/11  4:34 PM      Component Value Range Comment   Glucose-Capillary 129 (*) 70 - 99 (mg/dL)   GLUCOSE, CAPILLARY     Status: Abnormal   Collection Time   02/20/11 10:05 PM      Component Value Range Comment   Glucose-Capillary 144 (*) 70 - 99 (mg/dL)    Comment 1 Documented in Chart      Comment 2 Notify RN     GLUCOSE, CAPILLARY     Status: Abnormal   Collection Time   02/21/11  6:20 AM      Component Value Range Comment   Glucose-Capillary 141 (*) 70 - 99 (mg/dL)    Comment 1 Documented in Chart      Comment 2 Notify RN     BASIC METABOLIC PANEL     Status: Abnormal   Collection Time   02/21/11  7:20 AM      Component Value Range Comment   Sodium 136  135 - 145 (mEq/L)    Potassium 3.9  3.5 - 5.1 (mEq/L)    Chloride 105  96 - 112 (mEq/L)    CO2 20  19 - 32 (mEq/L)    Glucose, Bld 157 (*) 70 - 99 (mg/dL)    BUN 8  6 - 23 (mg/dL)    Creatinine, Ser 1.61  0.50 - 1.10 (mg/dL)    Calcium 9.0  8.4 - 10.5 (mg/dL)    GFR calc non Af Amer >90  >90 (mL/min)    GFR calc Af Amer >90  >90 (mL/min)   GLUCOSE, CAPILLARY     Status: Abnormal   Collection Time   02/21/11 11:02 AM      Component Value Range Comment   Glucose-Capillary 133 (*) 70 - 99 (mg/dL)    Comment 1 Notify RN      Comment 2 Documented in Chart     VANCOMYCIN, TROUGH     Status: Normal   Collection Time   02/21/11 11:32 AM      Component Value Range Comment   Vancomycin Tr 11.7  10.0 - 20.0 (ug/mL)   GLUCOSE, CAPILLARY     Status: Abnormal   Collection Time   02/21/11  4:34 PM      Component Value Range Comment   Glucose-Capillary 109 (*) 70 - 99 (mg/dL)   GLUCOSE, CAPILLARY     Status: Abnormal   Collection Time   02/21/11  9:48 PM      Component Value Range Comment   Glucose-Capillary 164 (*) 70 - 99 (mg/dL)    Comment 1 Notify RN     GLUCOSE, CAPILLARY     Status: Abnormal   Collection Time   02/22/11  6:22 AM      Component Value  Range Comment   Glucose-Capillary 150 (*) 70 - 99 (mg/dL)    Comment 1 Notify RN     GLUCOSE, CAPILLARY     Status:  Abnormal   Collection Time   02/22/11 10:56 AM      Component Value Range Comment   Glucose-Capillary 130 (*) 70 - 99 (mg/dL)    Comment 1 Notify RN      Comment 2 Documented in Chart        Signed: Pandora Leiter MD 02/22/2011, 2:48 PM

## 2011-03-12 ENCOUNTER — Encounter (HOSPITAL_COMMUNITY): Payer: Self-pay | Admitting: Pharmacist

## 2011-03-17 NOTE — H&P (Addendum)
  H&P Dictated 03/17/11 #[id number] DL

## 2011-03-18 ENCOUNTER — Other Ambulatory Visit: Payer: Self-pay

## 2011-03-18 ENCOUNTER — Encounter (HOSPITAL_COMMUNITY)
Admission: RE | Admit: 2011-03-18 | Discharge: 2011-03-18 | Disposition: A | Discharge status: Home / Self Care | Payer: BC Managed Care – PPO | Source: Ambulatory Visit | Attending: Obstetrics and Gynecology | Admitting: Obstetrics and Gynecology

## 2011-03-18 ENCOUNTER — Encounter (HOSPITAL_COMMUNITY): Payer: Self-pay

## 2011-03-18 DIAGNOSIS — Z01810 Encounter for preprocedural cardiovascular examination: Secondary | ICD-10-CM | POA: Insufficient documentation

## 2011-03-18 DIAGNOSIS — Z01812 Encounter for preprocedural laboratory examination: Secondary | ICD-10-CM | POA: Insufficient documentation

## 2011-03-18 HISTORY — DX: Gastro-esophageal reflux disease without esophagitis: K21.9

## 2011-03-18 LAB — BASIC METABOLIC PANEL
BUN: 8 mg/dL (ref 6–23)
CO2: 25 mEq/L (ref 19–32)
Calcium: 9.6 mg/dL (ref 8.4–10.5)
Chloride: 104 mEq/L (ref 96–112)
Creatinine, Ser: 0.59 mg/dL (ref 0.50–1.10)
GFR calc Af Amer: >90 mL/min (ref >90)
GFR calc non Af Amer: >90 mL/min (ref >90)
Glucose, Bld: 89 mg/dL (ref 70–99)
Potassium: 3.9 mEq/L (ref 3.5–5.1)
Sodium: 138 mEq/L (ref 135–145)

## 2011-03-18 LAB — CBC
HCT: 37.1 % (ref 36.0–46.0)
Hemoglobin: 11.7 g/dL — ABNORMAL LOW (ref 12.0–15.0)
MCH: 25.4 pg — ABNORMAL LOW (ref 26.0–34.0)
MCHC: 31.5 g/dL (ref 30.0–36.0)
MCV: 80.7 fL (ref 78.0–100.0)
Platelets: 419 10*3/uL — ABNORMAL HIGH (ref 150–400)
RBC: 4.6 MIL/uL (ref 3.87–5.11)
RDW: 15.3 % (ref 11.5–15.5)
WBC: 9.9 10*3/uL (ref 4.0–10.5)

## 2011-03-18 LAB — SURGICAL PCR SCREEN
MRSA, PCR: NEGATIVE
Staphylococcus aureus: NEGATIVE

## 2011-03-18 NOTE — H&P (Signed)
NAME:  [name], [name]                 ACCOUNT NO.:  MEDICAL RECORD NO.:  [id number]  LOCATION:                                 FACILITY:  PHYSICIAN:  Dineen Kid. Rana Snare, M.D.    DATE OF BIRTH:  1970-11-19  DATE OF ADMISSION:  03/17/2011 DATE OF DISCHARGE:                             HISTORY & PHYSICAL   [name] is going to have surgery on March 21, 2011 at [facility].  HISTORY OF PRESENT ILLNESS:  [name] is a 40 year old, G1, P1, with abnormal bleeding and menorrhagia.  She desires endometrial ablation. Also with no further childbearing desires, she also desires tubal ligation and presents for this.  She had an ultrasound evaluation on November 25, 2010, which showed normal-appearing endometrium and her bleeding has not been controlled with conservative management including Depo-Provera.  She is a diabetic and wishes to proceed with NovaSure ablation technique.  PAST MEDICAL HISTORY:  She is a adult onset diabetic.  MEDICATIONS:  Metformin and glyburide.  ALLERGIES:  She has no known drug allergies.  PREVIOUS SURGERIES:  Include gallbladder and vaginal delivery.  PHYSICAL EXAMINATION:  VITAL SIGNS:  Blood pressure is 120/86. HEART:  Regular rate and rhythm. LUNGS:  Clear to auscultation bilaterally. ABDOMEN:  Obese, nontender. PELVIC:  Uterus is anteverted, mobile, nontender.  IMPRESSION: 1. Menorrhagia. 2. Dysmenorrhea. 3. Abnormal bleeding. 4. Desires sterility.  PLAN: 1. Laparoscopic tubal ligation with Filshie clips. 2. Hysteroscopy, D and C, and NovaSure endometrial ablation. The risks and benefits of the procedure were discussed at length. Informed consent was obtained.     Dineen Kid Rana Snare, M.D.     DCL/MEDQ  D:  03/17/2011  T:  03/17/2011  Job:  [id number]

## 2011-03-18 NOTE — Patient Instructions (Signed)
YOUR PROCEDURE IS SCHEDULED ON:03/21/11  ENTER THROUGH THE MAIN ENTRANCE OF [facility] AT:6am  USE DESK PHONE AND DIAL [id number] TO INFORM us OF YOUR ARRIVAL  CALL [phone] IF YOU HAVE ANY QUESTIONS OR PROBLEMS PRIOR TO YOUR ARRIVAL.  REMEMBER: DO NOT EAT OR DRINK AFTER MIDNIGHT :  Sunday  SPECIAL INSTRUCTIONS:Hibiclens showers Hold Metformin for 24 hrs before surgery. Do not take Glipizide on day of surgery   YOU MAY BRUSH YOUR TEETH THE MORNING OF SURGERY   TAKE THESE MEDICINES THE DAY OF SURGERY WITH SIP OF WATER:Prilosec   DO NOT WEAR JEWELRY, EYE MAKEUP, LIPSTICK OR DARK FINGERNAIL POLISH DO NOT WEAR LOTIONS OR DEODORANT DO NOT SHAVE FOR 48 HOURS PRIOR TO SURGERY  YOU WILL NOT BE ALLOWED TO DRIVE YOURSELF HOME.  NAME OF DRIVER:Carl

## 2011-03-21 ENCOUNTER — Encounter (HOSPITAL_COMMUNITY): Admission: RE | Payer: Self-pay | Source: Ambulatory Visit

## 2011-03-21 ENCOUNTER — Ambulatory Visit (HOSPITAL_COMMUNITY)
Admission: RE | Admit: 2011-03-21 | Payer: BC Managed Care – PPO | Source: Ambulatory Visit | Admitting: Obstetrics and Gynecology

## 2011-03-21 SURGERY — LIGATION, FALLOPIAN TUBE, LAPAROSCOPIC
Anesthesia: General

## 2011-03-21 NOTE — Procedures (Signed)
Patient has arrived for surgery today.  Pt had a pre-op on Friday.  Upon patients arrival it appears that case has been cancelled according to [facility].  Patient states that she was never called about same.  Dr. Rana Snare called RN back and states that patient did no come by the office and pay her co pay prior to surgery.  MD states that they tried multiple times calling her and never got a phone call back.  Patient was informed that her surgery was cancelled on Friday because of same.  RN told patient to call the office for follow-up appointment for surgery.

## 2011-04-26 NOTE — H&P (Addendum)
[name] presents today for eval of AUB.  She was seen recently by my nurse practitioner for ovarian cyst and also irregular cycling.  She was placed on Depo-Provera.  She did fine for the first 1 to 1-1/2 months.  Now she has been having bleeding for the last 3 weeks.  Hemoglobin is 11.5.  At this point, she has no further childbearing desires.  Previous history of menorrhagia and dysmenorrhea.  She would like to proceed with a NovaSure ablation.  She has had a saline infusion ultrasound in the past. O: Physical exam:  General:  Alert and oriented.  Normocephalic and atraumatic.  No thyromegaly or masses palpated.  Heart is regular rate and rhythm without murmur.  Lungs are clear to auscultation bilaterally.  Breasts without masses, lymphadenopathy or discharge.  Abdomen is soft, nontender, nondistended.  No rebound or guarding.  No flank pain is noted.  Extremities without clubbing, cyanosis or edema.  Normal external genitalia, Bartholin, Skene's and urethra.  Vagina without lesions.  Cervix within normal limits.  Uterus is anteverted, mobile and nontender.  No adnexal masses are palpable.  No inguinal lymphadenopathy palpable.  I was unable to do the Pap smear due to the bleeding today. A/P: 1.  Well-woman exam.  We will need to do a routine Pap smear at some point.  2.  Menorrhagia, dysmenorrhea, irregular bleeding.  Discussed ablation, other techniques.  Discussed other options for abnormal bleeding.  She would like to proceed with this.  She also has multiparity and desires sterility.  Wants tubal ligation.  Plan laparoscopic tubal ligation and NovaSure endometrial ablation, hysteroscopy/D&C.  Discussed the risks and benefits of the procedure at length including risks associated with infection, bleeding, damage to uterus, tubes, ovary, bowel or bladder.  Risks associated with blood transfusion and general anesthesia.  Discussed tubal failure rate and quoted it at 5 out of 1000.   Discussed the success and complication rates of NovaSure ablation.  She does give her informed consent and wishes to proceed.  I wrote her a prescription for Tylox that she can use after the procedure #20.  Pt seen and examined,R&B reviewed and informed consent obtained. 04/29/11 0715 DL

## 2011-04-28 ENCOUNTER — Encounter (HOSPITAL_COMMUNITY): Payer: Self-pay | Admitting: *Deleted

## 2011-04-28 MED ORDER — DEXTROSE 5 % IV SOLN
2.0000 g | INTRAVENOUS | Status: DC
  Filled 2011-04-28: qty 2

## 2011-04-29 ENCOUNTER — Encounter (HOSPITAL_COMMUNITY)
Admission: RE | Disposition: A | Discharge status: Home / Self Care | Payer: Self-pay | Source: Ambulatory Visit | Attending: Obstetrics and Gynecology

## 2011-04-29 ENCOUNTER — Encounter (HOSPITAL_COMMUNITY): Payer: Self-pay | Admitting: Anesthesiology

## 2011-04-29 ENCOUNTER — Encounter (HOSPITAL_COMMUNITY): Payer: Self-pay | Admitting: *Deleted

## 2011-04-29 ENCOUNTER — Ambulatory Visit (HOSPITAL_COMMUNITY): Payer: BC Managed Care – PPO | Admitting: Anesthesiology

## 2011-04-29 ENCOUNTER — Other Ambulatory Visit: Payer: Self-pay | Admitting: Obstetrics and Gynecology

## 2011-04-29 ENCOUNTER — Ambulatory Visit (HOSPITAL_COMMUNITY)
Admission: RE | Admit: 2011-04-29 | Discharge: 2011-04-29 | Disposition: A | Discharge status: Home / Self Care | Payer: BC Managed Care – PPO | Source: Ambulatory Visit | Attending: Obstetrics and Gynecology | Admitting: Obstetrics and Gynecology

## 2011-04-29 DIAGNOSIS — N920 Excessive and frequent menstruation with regular cycle: Secondary | ICD-10-CM | POA: Insufficient documentation

## 2011-04-29 DIAGNOSIS — Z01812 Encounter for preprocedural laboratory examination: Secondary | ICD-10-CM | POA: Insufficient documentation

## 2011-04-29 DIAGNOSIS — Z01818 Encounter for other preprocedural examination: Secondary | ICD-10-CM | POA: Insufficient documentation

## 2011-04-29 DIAGNOSIS — Z9889 Other specified postprocedural states: Secondary | ICD-10-CM

## 2011-04-29 DIAGNOSIS — Z302 Encounter for sterilization: Secondary | ICD-10-CM | POA: Insufficient documentation

## 2011-04-29 DIAGNOSIS — N946 Dysmenorrhea, unspecified: Secondary | ICD-10-CM | POA: Insufficient documentation

## 2011-04-29 HISTORY — PX: LAPAROSCOPIC TUBAL LIGATION: SHX1937

## 2011-04-29 LAB — BASIC METABOLIC PANEL
BUN: 11 mg/dL (ref 6–23)
CO2: 26 mEq/L (ref 19–32)
Calcium: 9.2 mg/dL (ref 8.4–10.5)
Chloride: 101 mEq/L (ref 96–112)
Creatinine, Ser: 0.63 mg/dL (ref 0.50–1.10)
GFR calc Af Amer: >90 mL/min (ref >90)
GFR calc non Af Amer: >90 mL/min (ref >90)
Glucose, Bld: 186 mg/dL — ABNORMAL HIGH (ref 70–99)
Potassium: 4.1 mEq/L (ref 3.5–5.1)
Sodium: 136 mEq/L (ref 135–145)

## 2011-04-29 LAB — CBC
HCT: 37.5 % (ref 36.0–46.0)
Hemoglobin: 11.8 g/dL — ABNORMAL LOW (ref 12.0–15.0)
MCH: 24.9 pg — ABNORMAL LOW (ref 26.0–34.0)
MCHC: 31.5 g/dL (ref 30.0–36.0)
MCV: 79.3 fL (ref 78.0–100.0)
Platelets: 412 10*3/uL — ABNORMAL HIGH (ref 150–400)
RBC: 4.73 MIL/uL (ref 3.87–5.11)
RDW: 15.2 % (ref 11.5–15.5)
WBC: 11.7 10*3/uL — ABNORMAL HIGH (ref 4.0–10.5)

## 2011-04-29 LAB — PREGNANCY, URINE: Preg Test, Ur: NEGATIVE

## 2011-04-29 LAB — GLUCOSE, CAPILLARY: Glucose-Capillary: 198 mg/dL — ABNORMAL HIGH (ref 70–99)

## 2011-04-29 LAB — SURGICAL PCR SCREEN
MRSA, PCR: NEGATIVE
Staphylococcus aureus: NEGATIVE

## 2011-04-29 SURGERY — LIGATION, FALLOPIAN TUBE, LAPAROSCOPIC
Anesthesia: General | Site: Uterus | Wound class: Clean Contaminated

## 2011-04-29 MED ORDER — MIDAZOLAM HCL 2 MG/2ML IJ SOLN
INTRAMUSCULAR | Status: AC
  Filled 2011-04-29: qty 2

## 2011-04-29 MED ORDER — PROPOFOL 10 MG/ML IV EMUL
INTRAVENOUS | Status: AC
  Filled 2011-04-29: qty 20

## 2011-04-29 MED ORDER — ONDANSETRON HCL 4 MG/2ML IJ SOLN
INTRAMUSCULAR | Status: DC | PRN
  Administered 2011-04-29: 4 mg via INTRAVENOUS

## 2011-04-29 MED ORDER — KETOROLAC TROMETHAMINE 30 MG/ML IJ SOLN: 15.0000 mg | Freq: Once | INTRAMUSCULAR | Status: DC | PRN

## 2011-04-29 MED ORDER — HYDROMORPHONE HCL PF 1 MG/ML IJ SOLN
INTRAMUSCULAR | Status: AC
  Filled 2011-04-29: qty 1

## 2011-04-29 MED ORDER — NEOSTIGMINE METHYLSULFATE 1 MG/ML IJ SOLN
INTRAMUSCULAR | Status: AC
  Filled 2011-04-29: qty 10

## 2011-04-29 MED ORDER — DEXTROSE 5 % IV SOLN
2.0000 g | INTRAVENOUS | Status: AC
  Administered 2011-04-29: 2 g via INTRAVENOUS
  Filled 2011-04-29: qty 2

## 2011-04-29 MED ORDER — MUPIROCIN 2 % EX OINT
TOPICAL_OINTMENT | CUTANEOUS | Status: AC
  Administered 2011-04-29: 07:00:00 via NASAL
  Filled 2011-04-29: qty 22

## 2011-04-29 MED ORDER — NEOSTIGMINE METHYLSULFATE 1 MG/ML IJ SOLN
INTRAMUSCULAR | Status: DC | PRN
  Administered 2011-04-29: 3 mg via INTRAVENOUS

## 2011-04-29 MED ORDER — FENTANYL CITRATE 0.05 MG/ML IJ SOLN
INTRAMUSCULAR | Status: DC | PRN
  Administered 2011-04-29: 100 ug via INTRAVENOUS
  Administered 2011-04-29: 50 ug via INTRAVENOUS
  Administered 2011-04-29: 100 ug via INTRAVENOUS

## 2011-04-29 MED ORDER — HYDROMORPHONE HCL PF 1 MG/ML IJ SOLN
INTRAMUSCULAR | Status: DC | PRN
  Administered 2011-04-29: 1 mg via INTRAVENOUS

## 2011-04-29 MED ORDER — MIDAZOLAM HCL 5 MG/5ML IJ SOLN
INTRAMUSCULAR | Status: DC | PRN
  Administered 2011-04-29: 2 mg via INTRAVENOUS

## 2011-04-29 MED ORDER — BUPIVACAINE HCL (PF) 0.25 % IJ SOLN
INTRAMUSCULAR | Status: DC | PRN
  Administered 2011-04-29: 10 mL

## 2011-04-29 MED ORDER — LIDOCAINE HCL (CARDIAC) 20 MG/ML IV SOLN
INTRAVENOUS | Status: DC | PRN
  Administered 2011-04-29 (×2): 50 mg via INTRAVENOUS

## 2011-04-29 MED ORDER — FENTANYL CITRATE 0.05 MG/ML IJ SOLN
INTRAMUSCULAR | Status: AC
  Filled 2011-04-29: qty 5

## 2011-04-29 MED ORDER — ROCURONIUM BROMIDE 100 MG/10ML IV SOLN
INTRAVENOUS | Status: DC | PRN
  Administered 2011-04-29: 10 mg via INTRAVENOUS
  Administered 2011-04-29: 5 mg via INTRAVENOUS
  Administered 2011-04-29: 45 mg via INTRAVENOUS

## 2011-04-29 MED ORDER — KETOROLAC TROMETHAMINE 30 MG/ML IJ SOLN
INTRAMUSCULAR | Status: AC
  Filled 2011-04-29: qty 1

## 2011-04-29 MED ORDER — FENTANYL CITRATE 0.05 MG/ML IJ SOLN
INTRAMUSCULAR | Status: AC
  Administered 2011-04-29: 50 ug via INTRAVENOUS
  Filled 2011-04-29: qty 2

## 2011-04-29 MED ORDER — GLYCOPYRROLATE 0.2 MG/ML IJ SOLN
INTRAMUSCULAR | Status: DC | PRN
  Administered 2011-04-29: .6 mg via INTRAVENOUS

## 2011-04-29 MED ORDER — GLYCOPYRROLATE 0.2 MG/ML IJ SOLN
INTRAMUSCULAR | Status: AC
  Filled 2011-04-29: qty 1

## 2011-04-29 MED ORDER — ROCURONIUM BROMIDE 50 MG/5ML IV SOLN
INTRAVENOUS | Status: AC
  Filled 2011-04-29: qty 1

## 2011-04-29 MED ORDER — PROPOFOL 10 MG/ML IV EMUL
INTRAVENOUS | Status: DC | PRN
  Administered 2011-04-29: 200 mg via INTRAVENOUS

## 2011-04-29 MED ORDER — FENTANYL CITRATE 0.05 MG/ML IJ SOLN
25.0000 ug | INTRAMUSCULAR | Status: DC | PRN
  Administered 2011-04-29: 50 ug via INTRAVENOUS

## 2011-04-29 MED ORDER — LACTATED RINGERS IR SOLN
Status: DC | PRN
  Administered 2011-04-29: 3000 mL

## 2011-04-29 MED ORDER — LACTATED RINGERS IV SOLN
INTRAVENOUS | Status: DC
  Administered 2011-04-29: 07:00:00 via INTRAVENOUS

## 2011-04-29 MED ORDER — ONDANSETRON HCL 4 MG/2ML IJ SOLN
INTRAMUSCULAR | Status: AC
  Filled 2011-04-29: qty 2

## 2011-04-29 MED ORDER — KETOROLAC TROMETHAMINE 30 MG/ML IJ SOLN
INTRAMUSCULAR | Status: DC | PRN
  Administered 2011-04-29: 30 mg via INTRAVENOUS

## 2011-04-29 MED ORDER — LIDOCAINE HCL (CARDIAC) 20 MG/ML IV SOLN
INTRAVENOUS | Status: AC
  Filled 2011-04-29: qty 5

## 2011-04-29 SURGICAL SUPPLY — 21 items
ABLATOR ENDOMETRIAL BIPOLAR (ABLATOR) ×3 IMPLANT
CANISTER SUCTION 2500CC (MISCELLANEOUS) ×3 IMPLANT
CATH ROBINSON RED A/P 16FR (CATHETERS) ×3 IMPLANT
CLOTH BEACON ORANGE TIMEOUT ST (SAFETY) ×3 IMPLANT
CONTAINER PREFILL 10% NBF 60ML (FORM) ×6 IMPLANT
ELECT REM PT RETURN 9FT ADLT (ELECTROSURGICAL)
ELECTRODE REM PT RTRN 9FT ADLT (ELECTROSURGICAL) IMPLANT
GLOVE BIO SURGEON STRL SZ8 (GLOVE) ×3 IMPLANT
GLOVE SURG ORTHO 8.0 STRL STRW (GLOVE) ×3 IMPLANT
GOWN PREVENTION PLUS LG XLONG (DISPOSABLE) ×3 IMPLANT
GOWN STRL REIN XL XLG (GOWN DISPOSABLE) ×3 IMPLANT
LOOP ANGLED CUTTING 22FR (CUTTING LOOP) IMPLANT
PACK HYSTEROSCOPY LF (CUSTOM PROCEDURE TRAY) ×3 IMPLANT
PACK LAPAROSCOPY BASIN (CUSTOM PROCEDURE TRAY) ×3 IMPLANT
SOLUTION ELECTROLUBE (MISCELLANEOUS) ×3 IMPLANT
SUT VICRYL 0 UR6 27IN ABS (SUTURE) ×3 IMPLANT
SUT VICRYL RAPIDE 3 0 (SUTURE) ×3 IMPLANT
TOWEL OR 17X24 6PK STRL BLUE (TOWEL DISPOSABLE) ×6 IMPLANT
TROCAR Z-THREAD BLADED 11X100M (TROCAR) ×3 IMPLANT
WARMER LAPAROSCOPE (MISCELLANEOUS) ×3 IMPLANT
WATER STERILE IRR 1000ML POUR (IV SOLUTION) ×3 IMPLANT

## 2011-04-29 NOTE — Op Note (Addendum)
Desires steriltiy, menorrhagia Lap BTL with Filshie, Hyst D&C, Novasure Tylea Hise GET EBL min Saline def 75cc Nl anatomy No complications DL Dictation [id number]

## 2011-04-29 NOTE — Anesthesia Postprocedure Evaluation (Signed)
  Anesthesia Post-op Note  Patient: [name]  Procedure(s) Performed:  LAPAROSCOPIC TUBAL LIGATION - with Filshie Clips; DILATATION & CURETTAGE/HYSTEROSCOPY WITH NOVASURE ABLATION  Patient Location: PACU  Anesthesia Type: General  Level of Consciousness: awake, alert  and oriented  Airway and Oxygen Therapy: Patient Spontanous Breathing  Post-op Pain: none  Post-op Assessment: Post-op Vital signs reviewed, Patient's Cardiovascular Status Stable, Respiratory Function Stable, Patent Airway, No signs of Nausea or vomiting and Pain level controlled  Post-op Vital Signs: Reviewed and stable  Complications: No apparent anesthesia complications

## 2011-04-29 NOTE — Anesthesia Preprocedure Evaluation (Signed)
Anesthesia Evaluation  Patient identified by MRN, date of birth, ID band Patient awake    Reviewed: Allergy & Precautions, H&P , NPO status , Patient's Chart, lab work & pertinent test results, reviewed documented beta blocker date and time   History of Anesthesia Complications Negative for: history of anesthetic complications  Airway Mallampati: I TM Distance: >3 FB Neck ROM: full    Dental  (+) Teeth Intact   Pulmonary Current Smoker,  +snoring, but has not been tested for OSA clear to auscultation  Pulmonary exam normal       Cardiovascular Exercise Tolerance: Good neg cardio ROS regular Normal    Neuro/Psych Negative Neurological ROS  Negative Psych ROS   GI/Hepatic Neg liver ROS, GERD-  Medicated and Controlled,  Endo/Other  Diabetes mellitus-, Type 2, Oral Hypoglycemic AgentsMorbid obesity  Renal/GU negative Renal ROS  Genitourinary negative   Musculoskeletal   Abdominal   Peds  Hematology negative hematology ROS (+)   Anesthesia Other Findings   Reproductive/Obstetrics negative OB ROS                           Anesthesia Physical Anesthesia Plan  ASA: III  Anesthesia Plan: General ETT   Post-op Pain Management:    Induction:   Airway Management Planned:   Additional Equipment:   Intra-op Plan:   Post-operative Plan:   Informed Consent: I have reviewed the patients History and Physical, chart, labs and discussed the procedure including the risks, benefits and alternatives for the proposed anesthesia with the patient or authorized representative who has indicated his/her understanding and acceptance.   Dental Advisory Given  Plan Discussed with: CRNA and Surgeon  Anesthesia Plan Comments:         Anesthesia Quick Evaluation

## 2011-04-29 NOTE — Op Note (Signed)
NAMEAVALYNNE, Peterson            ACCOUNT NO.:  [id number]  MEDICAL RECORD NO.:  [id number]  LOCATION:  [facility]                          FACILITY:  [facility]  PHYSICIAN:  Dineen Kid. Rana Snare, M.D.    DATE OF BIRTH:  1970-04-23  DATE OF PROCEDURE:  04/29/2011 DATE OF DISCHARGE:                              OPERATIVE REPORT   PREOPERATIVE DIAGNOSES:  Desires sterility, incapacitating menorrhagia.  POSTOPERATIVE DIAGNOSES:  Desires sterility, incapacitating menorrhagia.  PROCEDURE:  Laparoscopy with bilateral tubal ligation with Filshie clips, hysteroscopy, D and C, and NovaSure endometrial ablation.  SURGEON:  Dineen Kid. Rana Snare, MD  ANESTHESIA:  General endotracheal.  INDICATIONS:  [name] is a 41 year old with incapacitating menorrhagia.  She desires NovaSure endometrial ablation.  She underwent saline infusion ultrasound which showed essentially normal appearing endometrial cavity.  She presents for tubal ligation as well as NovaSure.  We discussed the risks, benefits of the procedure at length. Informed consent was obtained.  See history and physical for further details.  FINDINGS:  At time of surgery normal-appearing uterus, tubes, and ovaries.  Normal-appearing endometrial cavity.  DESCRIPTION OF PROCEDURE:  After an adequate analgesia, the patient was placed in dorsal lithotomy position. She was sterilely prepped and draped.  Bladder was sterilely drained.  Graves speculum was placed. Tenaculum was placed in the anterior lip of the cervix and a Cohen tenaculum was then placed.  A 1 cm infraumbilical skin incision was made.  Veress needle was inserted.  The abdomen was insufflated with dullness to percussion.  An 11 mm trocar was inserted.  The above findings were noted.  A 5 mm trocar was inserted left of the midline 2 fingerbreadths above the pubic symphysis under direct visualization. After evaluation of the abdomen and pelvis, which was very limited due to her morbid obesity, able  to identify the uterus and fallopian tubes by the fimbriated end.  The right and left fallopian tubes were identified by the fimbriated end.  Midportion of the fallopian tubes were identified and grasped with a Filshie clip and Filshie clip placed across the midportion of the fallopian tubes.  Good placement noted across the entirety of the tube.  This was done bilaterally and care taken to assure that was fallopian tube and good placement noted across the entirety of the tube.  The uterus did appear to be normal at this time.  The trocars were removed.  The infraumbilical skin incision was closed with 0 Vicryl interrupted suture in the fascia, 3-0 Vicryl Rapide subcuticular suture, and then the 5 mm site was closed with 3-0 Vicryl repeat subcuticular suture.  The incisions were injected with 0.25% Marcaine, total of 10 mL used.  The legs were repositioned.  Graves speculum was placed.  The Cohen tenaculum was removed.  The uterus sounded to 9.5 cm.  The cervix was easily dilated to a #27 Pratt dilator.  Hysteroscope was inserted.  The above findings were noted.  A gentle sharp curettage was carried out without difficulty.  The fragments of endometrium were sent for pathology.  The NovaSure device was then inserted with a cavity width of 3.6 cm, cavity length of 5.5 cm.  The device easily passed the cavity assessment test,  was activated under [employer] for total of 65 seconds.  The device was then removed. Reexamination with the hysteroscope revealed good thermal cautery effect.  No obvious complications had been noted.  The hysteroscope was then removed.  Tenaculum removed from the cervix.  The cervix appeared normal and was hemostatic.  The patient was then transferred to recovery room in stable condition.  Sponge, instrument, and needle count was normal x3.  Estimated blood loss was minimal.  Saline deficit 75 mL. The patient received 2 g of cefotetan preoperatively and 30 mg  Toradol intraoperatively.  DISPOSITION:  The patient will be discharged home and follow up in the office in 3 weeks.  Sent home with routine instruction sheet for D and C, and also for tubal.  Told to return for increased pain, fever, or bleeding.  She has a prescription for Percocet to take as needed for pain.     Dineen Kid Rana Snare, M.D.     DCL/MEDQ  D:  04/29/2011  T:  04/29/2011  Job:  [id number]

## 2011-04-29 NOTE — Transfer of Care (Signed)
Immediate Anesthesia Transfer of Care Note  Patient: [name]  Procedure(s) Performed:  LAPAROSCOPIC TUBAL LIGATION - with Filshie Clips; DILATATION & CURETTAGE/HYSTEROSCOPY WITH NOVASURE ABLATION  Patient Location: PACU  Anesthesia Type: General  Level of Consciousness: awake, alert  and oriented  Airway & Oxygen Therapy: Patient Spontanous Breathing and Patient connected to nasal cannula oxygen  Post-op Assessment: Report given to PACU RN and Post -op Vital signs reviewed and stable  Post vital signs: Reviewed and stable Filed Vitals:   04/29/11 0633  BP: 140/92  Pulse: 111  Temp: 36.7 C  Resp: 20    Complications: No apparent anesthesia complications

## 2011-05-02 ENCOUNTER — Encounter (HOSPITAL_COMMUNITY): Payer: Self-pay | Admitting: Obstetrics and Gynecology

## 2011-06-21 ENCOUNTER — Encounter (HOSPITAL_COMMUNITY): Payer: Self-pay | Admitting: Emergency Medicine

## 2011-06-21 ENCOUNTER — Emergency Department (HOSPITAL_COMMUNITY): Payer: BC Managed Care – PPO

## 2011-06-21 ENCOUNTER — Emergency Department (HOSPITAL_COMMUNITY)
Admission: EM | Admit: 2011-06-21 | Discharge: 2011-06-22 | Disposition: A | Discharge status: Home / Self Care | Payer: BC Managed Care – PPO | Attending: Emergency Medicine | Admitting: Emergency Medicine

## 2011-06-21 DIAGNOSIS — Z9089 Acquired absence of other organs: Secondary | ICD-10-CM | POA: Insufficient documentation

## 2011-06-21 DIAGNOSIS — M25561 Pain in right knee: Secondary | ICD-10-CM

## 2011-06-21 DIAGNOSIS — F17200 Nicotine dependence, unspecified, uncomplicated: Secondary | ICD-10-CM | POA: Insufficient documentation

## 2011-06-21 DIAGNOSIS — M1710 Unilateral primary osteoarthritis, unspecified knee: Secondary | ICD-10-CM | POA: Insufficient documentation

## 2011-06-21 DIAGNOSIS — K219 Gastro-esophageal reflux disease without esophagitis: Secondary | ICD-10-CM | POA: Insufficient documentation

## 2011-06-21 DIAGNOSIS — E119 Type 2 diabetes mellitus without complications: Secondary | ICD-10-CM | POA: Insufficient documentation

## 2011-06-21 MED ORDER — HYDROCODONE-ACETAMINOPHEN 5-500 MG PO TABS: 1.0000 | ORAL_TABLET | Freq: Four times a day (QID) | ORAL | Status: AC | PRN

## 2011-06-21 MED ORDER — NAPROXEN 500 MG PO TABS: 500.0000 mg | ORAL_TABLET | Freq: Two times a day (BID) | ORAL | Status: DC

## 2011-06-21 NOTE — ED Notes (Signed)
Pt has pain to the later aspect of her R knee. Pt drives a bus for a job. Pt unsure of the cause of injury.

## 2011-06-21 NOTE — Discharge Instructions (Signed)
Your x-ray showed signs of arthritis in your knee. Keep your leg elevated at home. Ice it several times a day. Start naprosyn for pain and inflammation. Take vicodin as prescribed as needed for severe pain. Follow up with primary care doctor or orthopedics.

## 2011-06-21 NOTE — ED Provider Notes (Signed)
History     CSN: [id number]  Arrival date & time 06/21/11  2102   First MD Initiated Contact with Patient 06/21/11 2249      Chief Complaint  Patient presents with  . Knee Pain    (Consider location/radiation/quality/duration/timing/severity/associated sxs/prior treatment) Patient is a 41 y.o. female presenting with knee pain. The history is provided by the patient.  Knee Pain This is a new problem. The current episode started in the past 7 days. The problem occurs constantly. The problem has been gradually worsening. Associated symptoms include arthralgias. Pertinent negatives include no chills, fever, joint swelling, myalgias, numbness, rash or weakness. The symptoms are aggravated by bending, walking and standing. She has tried nothing for the symptoms.  Pt states she has had progressive pain in right knee. No injuries. Pain in lateral aspect. Stats there is "catching and popping." States she has been doing a lot of walking and getting in and out of the truck. No fever, chills, joint swelling, no leg pain.  Past Medical History  Diagnosis Date  . Diabetes mellitus   . GERD (gastroesophageal reflux disease)     Past Surgical History  Procedure Date  . Cholecystectomy   . Knee arthroscopy   . Laparoscopic tubal ligation 04/29/2011    Procedure: LAPAROSCOPIC TUBAL LIGATION;  Surgeon: Turner Daniels, MD;  Location: [facility];  Service: Gynecology;  Laterality: Bilateral;  with Filshie Clips    No family history on file.  History  Substance Use Topics  . Smoking status: Current Everyday Smoker -- 0.5 packs/day    Types: Cigarettes  . Smokeless tobacco: Not on file  . Alcohol Use: Yes    OB History    Grav Para Term Preterm Abortions TAB SAB Ect Mult Living                  Review of Systems  Constitutional: Negative for fever and chills.  HENT: Negative.   Eyes: Negative.   Respiratory: Negative.   Cardiovascular: Negative.   Gastrointestinal: Negative.     Genitourinary: Negative.   Musculoskeletal: Positive for arthralgias and gait problem. Negative for myalgias and joint swelling.  Skin: Negative for color change and rash.  Neurological: Negative.  Negative for weakness and numbness.  Psychiatric/Behavioral: Negative.     Allergies  Adhesive  Home Medications   Current Outpatient Rx  Name Route Sig Dispense Refill  . ACETAMINOPHEN 500 MG PO TABS Oral Take 500 mg by mouth every 6 (six) hours as needed. For pain     . GLIPIZIDE 5 MG PO TABS Oral Take 1 tablet (5 mg total) by mouth daily before breakfast. 30 tablet 0  . METFORMIN HCL 500 MG PO TABS Oral Take 1 tablet (500 mg total) by mouth 2 (two) times daily with a meal. 60 tablet 0  . OMEPRAZOLE MAGNESIUM 20 MG PO TBEC Oral Take 20 mg by mouth daily. OTC Prilosec      BP 123/67  Pulse 107  Temp(Src) 97.6 F (36.4 C) (Oral)  SpO2 98%  Physical Exam  Nursing note and vitals reviewed. Constitutional: She is oriented to person, place, and time. She appears well-developed and well-nourished. No distress.  HENT:  Head: Normocephalic.  Neck: Neck supple.  Cardiovascular: Normal rate, regular rhythm and normal heart sounds.   Pulmonary/Chest: Breath sounds normal. No respiratory distress.  Abdominal: Soft. Bowel sounds are normal. She exhibits no distension.  Musculoskeletal: She exhibits no edema.       Right knee normal appearing. No  swelling, bruising, erythema. Limmited ROM due to pain. Joint stable, negative anterior and posterior drawer signs. Pain with flexion, extension.   Neurological: She is alert and oriented to person, place, and time.  Skin: Skin is warm and dry. No rash noted. No erythema.  Psychiatric: She has a normal mood and affect.    ED Course  Procedures (including critical care time)  Labs Reviewed - No data to display Dg Knee 2 Views Right  06/21/2011  *RADIOLOGY REPORT*  Clinical Data: Right knee pain.  RIGHT KNEE - 1-2 VIEW  Comparison: 01/29/2010   Findings: There is no fracture or dislocation or joint effusion. Mild arthritic changes in all three compartments, stable.  IMPRESSION: Mild osteoarthritis of the right knee.  No acute abnormality.  Original Report Authenticated By: Gwynn Burly, M.D.   Right knee pain, with no acute injury. Pt is morbidly obese. Mild arthritis on x-ray. Will try NSAIDs, RICE, follow up with ortho or primary care doctor. Pt is ambulatory, joint is stable, no signs of infection.  No diagnosis found.    MDM          Lottie Mussel, PA 06/21/11 2330

## 2011-06-22 NOTE — ED Provider Notes (Signed)
Medical screening examination/treatment/procedure(s) were performed by non-physician practitioner and as supervising physician I was immediately available for consultation/collaboration.   Loren Racer, MD 06/22/11 2040

## 2011-07-18 DIAGNOSIS — L0390 Cellulitis, unspecified: Secondary | ICD-10-CM

## 2011-07-18 HISTORY — DX: Cellulitis, unspecified: L03.90

## 2011-08-04 ENCOUNTER — Encounter (HOSPITAL_BASED_OUTPATIENT_CLINIC_OR_DEPARTMENT_OTHER): Payer: Self-pay | Admitting: *Deleted

## 2011-08-04 ENCOUNTER — Inpatient Hospital Stay (HOSPITAL_BASED_OUTPATIENT_CLINIC_OR_DEPARTMENT_OTHER)
Admission: EM | Admit: 2011-08-04 | Discharge: 2011-08-10 | DRG: 278 | Disposition: A | Discharge status: Home / Self Care | Payer: BC Managed Care – PPO | Source: Ambulatory Visit | Attending: Internal Medicine | Admitting: Internal Medicine

## 2011-08-04 DIAGNOSIS — F17200 Nicotine dependence, unspecified, uncomplicated: Secondary | ICD-10-CM | POA: Diagnosis present

## 2011-08-04 DIAGNOSIS — L02419 Cutaneous abscess of limb, unspecified: Principal | ICD-10-CM | POA: Diagnosis present

## 2011-08-04 DIAGNOSIS — E119 Type 2 diabetes mellitus without complications: Secondary | ICD-10-CM | POA: Diagnosis present

## 2011-08-04 DIAGNOSIS — L03119 Cellulitis of unspecified part of limb: Principal | ICD-10-CM | POA: Diagnosis present

## 2011-08-04 DIAGNOSIS — K219 Gastro-esophageal reflux disease without esophagitis: Secondary | ICD-10-CM | POA: Diagnosis present

## 2011-08-04 DIAGNOSIS — Z6841 Body Mass Index (BMI) 40.0 and over, adult: Secondary | ICD-10-CM | POA: Diagnosis present

## 2011-08-04 DIAGNOSIS — R609 Edema, unspecified: Secondary | ICD-10-CM | POA: Diagnosis present

## 2011-08-04 DIAGNOSIS — L0390 Cellulitis, unspecified: Secondary | ICD-10-CM | POA: Diagnosis present

## 2011-08-04 DIAGNOSIS — E669 Obesity, unspecified: Secondary | ICD-10-CM | POA: Diagnosis present

## 2011-08-04 HISTORY — DX: Cellulitis, unspecified: L03.90

## 2011-08-04 NOTE — ED Provider Notes (Signed)
History     CSN: [id number]  Arrival date & time 08/04/11  2152   First MD Initiated Contact with Patient 08/04/11 2321      Chief Complaint  Patient presents with  . Leg Swelling    (Consider location/radiation/quality/duration/timing/severity/associated sxs/prior treatment) HPI This is a 41 year old white female with a history of cellulitis of the lower legs. She has been having nausea for the past 3 days. She had increased swelling of the right lower leg beginning yesterday which worsened today. She is subsequently developed erythema and pain in the right lower leg consistent with previous episodes of cellulitis. She states both legs are chronically edematous due to past episodes of cellulitis. There is moderate pain associated with it. She's not aware of having a fever.  Past Medical History  Diagnosis Date  . Diabetes mellitus   . GERD (gastroesophageal reflux disease)     Past Surgical History  Procedure Date  . Cholecystectomy   . Knee arthroscopy   . Laparoscopic tubal ligation 04/29/2011    Procedure: LAPAROSCOPIC TUBAL LIGATION;  Surgeon: Turner Daniels, MD;  Location: [facility];  Service: Gynecology;  Laterality: Bilateral;  with Filshie Clips    History reviewed. No pertinent family history.  History  Substance Use Topics  . Smoking status: Current Everyday Smoker -- 0.5 packs/day    Types: Cigarettes  . Smokeless tobacco: Not on file  . Alcohol Use: Yes    OB History    Grav Para Term Preterm Abortions TAB SAB Ect Mult Living                  Review of Systems  All other systems reviewed and are negative.    Allergies  Adhesive  Home Medications   Current Outpatient Rx  Name Route Sig Dispense Refill  . GLIPIZIDE 5 MG PO TABS Oral Take 1 tablet (5 mg total) by mouth daily before breakfast. 30 tablet 0  . METFORMIN HCL 500 MG PO TABS Oral Take 1 tablet (500 mg total) by mouth 2 (two) times daily with a meal. 60 tablet 0  .  BACITRACIN-NEOMYCIN-POLYMYXIN OINTMENT TUBE Topical Apply 1 application topically daily as needed. Patient used this medication for her foot.    Marland Kitchen OMEPRAZOLE MAGNESIUM 20 MG PO TBEC Oral Take 20 mg by mouth daily. OTC Prilosec      BP 136/74  Pulse 115  Temp(Src) 98.9 F (37.2 C) (Oral)  Resp 20  Ht 5\' 1"  (1.549 m)  Wt 340 lb (154.223 kg)  BMI 64.24 kg/m2  SpO2 98%  Physical Exam General: Well-developed, well-nourished female in no acute distress; appearance consistent with age of record HENT: normocephalic, atraumatic Eyes: pupils equal round and reactive to light; extraocular muscles intact Neck: supple Heart: regular rate and rhythm; tachycardia Lungs: clear to auscultation bilaterally Abdomen: soft; obese Extremities: No deformity; full range of motion; 4+ edema of right lower leg, 3+ edema of left lower leg; erythema and skin irregularity right lower leg with warmth and tenderness; no significant erythema of left lower leg Neurologic: Awake, alert and oriented; motor function intact in all extremities and symmetric; no facial droop Skin: Warm and dry Psychiatric: Normal mood and affect    ED Course  Procedures (including critical care time)     MDM   Nursing notes and vitals signs, including pulse oximetry, reviewed.  Summary of this visit's results, reviewed by myself:  Labs:  Results for orders placed during the hospital encounter of 08/04/11  BASIC METABOLIC PANEL  Component Value Range   Sodium 138  135 - 145 (mEq/L)   Potassium 3.9  3.5 - 5.1 (mEq/L)   Chloride 101  96 - 112 (mEq/L)   CO2 27  19 - 32 (mEq/L)   Glucose, Bld 272 (*) 70 - 99 (mg/dL)   BUN 13  6 - 23 (mg/dL)   Creatinine, Ser 4.09  0.50 - 1.10 (mg/dL)   Calcium 9.1  8.4 - 81.1 (mg/dL)   GFR calc non Af Amer >90  >90 (mL/min)   GFR calc Af Amer >90  >90 (mL/min)  CBC      Component Value Range   WBC 12.0 (*) 4.0 - 10.5 (K/uL)   RBC 4.66  3.87 - 5.11 (MIL/uL)   Hemoglobin 11.9 (*)  12.0 - 15.0 (g/dL)   HCT 91.4  78.2 - 95.6 (%)   MCV 77.7 (*) 78.0 - 100.0 (fL)   MCH 25.5 (*) 26.0 - 34.0 (pg)   MCHC 32.9  30.0 - 36.0 (g/dL)   RDW 21.3 (*) 08.6 - 15.5 (%)   Platelets 359  150 - 400 (K/uL)  DIFFERENTIAL      Component Value Range   Neutrophils Relative 67  43 - 77 (%)   Neutro Abs 8.1 (*) 1.7 - 7.7 (K/uL)   Lymphocytes Relative 21  12 - 46 (%)   Lymphs Abs 2.6  0.7 - 4.0 (K/uL)   Monocytes Relative 8  3 - 12 (%)   Monocytes Absolute 0.9  0.1 - 1.0 (K/uL)   Eosinophils Relative 3  0 - 5 (%)   Eosinophils Absolute 0.4  0.0 - 0.7 (K/uL)   Basophils Relative 0  0 - 1 (%)   Basophils Absolute 0.0  0.0 - 0.1 (K/uL)            Hanley Seamen, MD 08/05/11 [phone]

## 2011-08-04 NOTE — ED Notes (Signed)
Patient c/o of right leg swelling with blisters on front and back of leg. Pt states that she has had cellulitis in this same leg December 2012. Pt states that she has been feeling nauseous x 3 days. Pt feels more nauseous, with dizziness and headache.

## 2011-08-04 NOTE — ED Notes (Signed)
Pt c/o right lower leg swelling and redness  With nausea x 3 days  HX cellulitis

## 2011-08-05 ENCOUNTER — Encounter (HOSPITAL_COMMUNITY): Payer: Self-pay | Admitting: General Practice

## 2011-08-05 ENCOUNTER — Inpatient Hospital Stay (HOSPITAL_COMMUNITY): Payer: BC Managed Care – PPO

## 2011-08-05 DIAGNOSIS — R609 Edema, unspecified: Secondary | ICD-10-CM | POA: Diagnosis present

## 2011-08-05 DIAGNOSIS — E669 Obesity, unspecified: Secondary | ICD-10-CM | POA: Diagnosis present

## 2011-08-05 DIAGNOSIS — Z6841 Body Mass Index (BMI) 40.0 and over, adult: Secondary | ICD-10-CM | POA: Diagnosis present

## 2011-08-05 DIAGNOSIS — L0390 Cellulitis, unspecified: Secondary | ICD-10-CM | POA: Diagnosis present

## 2011-08-05 DIAGNOSIS — K219 Gastro-esophageal reflux disease without esophagitis: Secondary | ICD-10-CM | POA: Diagnosis present

## 2011-08-05 LAB — CREATININE, SERUM
Creatinine, Ser: 0.6 mg/dL (ref 0.50–1.10)
GFR calc Af Amer: >90 mL/min (ref >90)
GFR calc non Af Amer: >90 mL/min (ref >90)

## 2011-08-05 LAB — DIFFERENTIAL
Basophils Absolute: 0 10*3/uL (ref 0.0–0.1)
Basophils Relative: 0 % (ref 0–1)
Eosinophils Absolute: 0.4 10*3/uL (ref 0.0–0.7)
Eosinophils Relative: 3 % (ref 0–5)
Lymphocytes Relative: 21 % (ref 12–46)
Lymphs Abs: 2.6 10*3/uL (ref 0.7–4.0)
Monocytes Absolute: 0.9 10*3/uL (ref 0.1–1.0)
Monocytes Relative: 8 % (ref 3–12)
Neutro Abs: 8.1 10*3/uL — ABNORMAL HIGH (ref 1.7–7.7)
Neutrophils Relative %: 67 % (ref 43–77)

## 2011-08-05 LAB — BASIC METABOLIC PANEL
BUN: 13 mg/dL (ref 6–23)
CO2: 27 mEq/L (ref 19–32)
Calcium: 9.1 mg/dL (ref 8.4–10.5)
Chloride: 101 mEq/L (ref 96–112)
Creatinine, Ser: 0.7 mg/dL (ref 0.50–1.10)
GFR calc Af Amer: >90 mL/min (ref >90)
GFR calc non Af Amer: >90 mL/min (ref >90)
Glucose, Bld: 272 mg/dL — ABNORMAL HIGH (ref 70–99)
Potassium: 3.9 mEq/L (ref 3.5–5.1)
Sodium: 138 mEq/L (ref 135–145)

## 2011-08-05 LAB — GLUCOSE, CAPILLARY
Glucose-Capillary: 214 mg/dL — ABNORMAL HIGH (ref 70–99)
Glucose-Capillary: 161 mg/dL — ABNORMAL HIGH (ref 70–99)
Glucose-Capillary: 158 mg/dL — ABNORMAL HIGH (ref 70–99)
Glucose-Capillary: 188 mg/dL — ABNORMAL HIGH (ref 70–99)

## 2011-08-05 LAB — CBC
HCT: 35.9 % — ABNORMAL LOW (ref 36.0–46.0)
HCT: 36.2 % (ref 36.0–46.0)
Hemoglobin: 11.4 g/dL — ABNORMAL LOW (ref 12.0–15.0)
Hemoglobin: 11.9 g/dL — ABNORMAL LOW (ref 12.0–15.0)
MCH: 25.3 pg — ABNORMAL LOW (ref 26.0–34.0)
MCH: 25.5 pg — ABNORMAL LOW (ref 26.0–34.0)
MCHC: 31.8 g/dL (ref 30.0–36.0)
MCHC: 32.9 g/dL (ref 30.0–36.0)
MCV: 77.7 fL — ABNORMAL LOW (ref 78.0–100.0)
MCV: 79.6 fL (ref 78.0–100.0)
Platelets: 323 10*3/uL (ref 150–400)
Platelets: 359 10*3/uL (ref 150–400)
RBC: 4.51 MIL/uL (ref 3.87–5.11)
RBC: 4.66 MIL/uL (ref 3.87–5.11)
RDW: 16.1 % — ABNORMAL HIGH (ref 11.5–15.5)
RDW: 16.2 % — ABNORMAL HIGH (ref 11.5–15.5)
WBC: 12 10*3/uL — ABNORMAL HIGH (ref 4.0–10.5)
WBC: 8.3 10*3/uL (ref 4.0–10.5)

## 2011-08-05 LAB — HEMOGLOBIN A1C
Hgb A1c MFr Bld: 9.3 % — ABNORMAL HIGH (ref <5.7)
Mean Plasma Glucose: 220 mg/dL — ABNORMAL HIGH (ref <117)

## 2011-08-05 MED ORDER — ACETAMINOPHEN 650 MG RE SUPP: 650.0000 mg | Freq: Four times a day (QID) | RECTAL | Status: DC | PRN

## 2011-08-05 MED ORDER — ALUM & MAG HYDROXIDE-SIMETH 200-200-20 MG/5ML PO SUSP: 30.0000 mL | Freq: Four times a day (QID) | ORAL | Status: DC | PRN

## 2011-08-05 MED ORDER — IOHEXOL 300 MG/ML  SOLN
100.0000 mL | Freq: Once | INTRAMUSCULAR | Status: AC | PRN
  Administered 2011-08-05: 100 mL via INTRAVENOUS

## 2011-08-05 MED ORDER — PANTOPRAZOLE SODIUM 40 MG PO TBEC
40.0000 mg | DELAYED_RELEASE_TABLET | Freq: Every day | ORAL | Status: DC
  Administered 2011-08-05 – 2011-08-10 (×6): 40 mg via ORAL
  Filled 2011-08-05 (×6): qty 1

## 2011-08-05 MED ORDER — ONDANSETRON HCL 4 MG/2ML IJ SOLN
INTRAMUSCULAR | Status: AC
  Filled 2011-08-05: qty 2

## 2011-08-05 MED ORDER — VANCOMYCIN HCL 1000 MG IV SOLR
2500.0000 mg | Freq: Once | INTRAVENOUS | Status: AC
  Administered 2011-08-05: 2500 mg via INTRAVENOUS
  Filled 2011-08-05: qty 2500

## 2011-08-05 MED ORDER — HYDROMORPHONE HCL PF 1 MG/ML IJ SOLN
1.0000 mg | Freq: Once | INTRAMUSCULAR | Status: AC
  Administered 2011-08-05: 1 mg via INTRAVENOUS
  Filled 2011-08-05: qty 1

## 2011-08-05 MED ORDER — VANCOMYCIN HCL 1000 MG IV SOLR
1500.0000 mg | Freq: Two times a day (BID) | INTRAVENOUS | Status: DC
  Administered 2011-08-06 – 2011-08-10 (×9): 1500 mg via INTRAVENOUS
  Filled 2011-08-05 (×10): qty 1500

## 2011-08-05 MED ORDER — ACETAMINOPHEN 325 MG PO TABS
650.0000 mg | ORAL_TABLET | Freq: Four times a day (QID) | ORAL | Status: DC | PRN
  Administered 2011-08-09: 650 mg via ORAL
  Filled 2011-08-05: qty 2

## 2011-08-05 MED ORDER — INSULIN ASPART 100 UNIT/ML ~~LOC~~ SOLN
0.0000 [IU] | Freq: Three times a day (TID) | SUBCUTANEOUS | Status: DC
  Administered 2011-08-05 (×2): 3 [IU] via SUBCUTANEOUS
  Administered 2011-08-06: 2 [IU] via SUBCUTANEOUS
  Administered 2011-08-06 (×2): 3 [IU] via SUBCUTANEOUS
  Administered 2011-08-07: 2 [IU] via SUBCUTANEOUS
  Administered 2011-08-07: 3 [IU] via SUBCUTANEOUS
  Administered 2011-08-07: 5 [IU] via SUBCUTANEOUS
  Administered 2011-08-08 – 2011-08-10 (×5): 2 [IU] via SUBCUTANEOUS

## 2011-08-05 MED ORDER — HYDROMORPHONE HCL PF 1 MG/ML IJ SOLN: 1.0000 mg | INTRAMUSCULAR | Status: DC | PRN

## 2011-08-05 MED ORDER — INSULIN ASPART 100 UNIT/ML ~~LOC~~ SOLN: 0.0000 [IU] | Freq: Every day | SUBCUTANEOUS | Status: DC

## 2011-08-05 MED ORDER — ONDANSETRON HCL 4 MG/2ML IJ SOLN
4.0000 mg | Freq: Four times a day (QID) | INTRAMUSCULAR | Status: DC | PRN
  Administered 2011-08-08: 4 mg via INTRAVENOUS
  Filled 2011-08-05: qty 2

## 2011-08-05 MED ORDER — DIPHENHYDRAMINE HCL 50 MG/ML IJ SOLN
25.0000 mg | Freq: Four times a day (QID) | INTRAMUSCULAR | Status: DC | PRN
  Administered 2011-08-05: 15:00:00 via INTRAVENOUS
  Administered 2011-08-06 – 2011-08-10 (×6): 25 mg via INTRAVENOUS
  Filled 2011-08-05 (×9): qty 1

## 2011-08-05 MED ORDER — HYDROCODONE-ACETAMINOPHEN 5-325 MG PO TABS
1.0000 | ORAL_TABLET | ORAL | Status: DC | PRN
  Administered 2011-08-05 – 2011-08-09 (×8): 2 via ORAL
  Filled 2011-08-05 (×8): qty 2

## 2011-08-05 MED ORDER — FUROSEMIDE 40 MG PO TABS
40.0000 mg | ORAL_TABLET | Freq: Every day | ORAL | Status: DC
  Administered 2011-08-05 – 2011-08-10 (×6): 40 mg via ORAL
  Filled 2011-08-05 (×6): qty 1

## 2011-08-05 MED ORDER — ONDANSETRON HCL 4 MG/2ML IJ SOLN
4.0000 mg | Freq: Once | INTRAMUSCULAR | Status: AC
  Administered 2011-08-05: 4 mg via INTRAVENOUS

## 2011-08-05 MED ORDER — ONDANSETRON HCL 4 MG PO TABS
4.0000 mg | ORAL_TABLET | Freq: Four times a day (QID) | ORAL | Status: DC | PRN
  Administered 2011-08-05: 4 mg via ORAL

## 2011-08-05 MED ORDER — ENOXAPARIN SODIUM 40 MG/0.4ML ~~LOC~~ SOLN
40.0000 mg | SUBCUTANEOUS | Status: DC
  Administered 2011-08-05 – 2011-08-07 (×3): 40 mg via SUBCUTANEOUS
  Filled 2011-08-05 (×3): qty 0.4

## 2011-08-05 MED ORDER — SODIUM CHLORIDE 0.9 % IV SOLN
INTRAVENOUS | Status: DC
  Administered 2011-08-05: via INTRAVENOUS

## 2011-08-05 MED ORDER — VANCOMYCIN HCL IN DEXTROSE 1-5 GM/200ML-% IV SOLN
1000.0000 mg | Freq: Once | INTRAVENOUS | Status: AC
  Administered 2011-08-05: 1000 mg via INTRAVENOUS
  Filled 2011-08-05: qty 200

## 2011-08-05 MED ORDER — OMEPRAZOLE MAGNESIUM 20 MG PO TBEC: 20.0000 mg | DELAYED_RELEASE_TABLET | Freq: Every day | ORAL | Status: DC

## 2011-08-05 NOTE — Progress Notes (Signed)
Pt's face and chest are red, and she c/o itching during administration of Vancomycin. MD notified.[name] 08/05/2011

## 2011-08-05 NOTE — Progress Notes (Signed)
VASCULAR LAB PRELIMINARY  PRELIMINARY  PRELIMINARY  PRELIMINARY  Bilateral lower extremity venous duplex completed.    Preliminary report:  Technically difficult and limited exam due to body habitus.  Only able to evaluate common femoral and popliteal veins bilaterally.  No evidence of DVT was noted in the veins.  Unable to make any statements concerning other veins.     Vanna Scotland,   RVT 08/05/2011, 1:15 PM

## 2011-08-05 NOTE — Progress Notes (Signed)
ANTIBIOTIC CONSULT NOTE - INITIAL  Pharmacy Consult for Vancomycin Indication: bilateral cellulitis  Allergies  Allergen Reactions  . Adhesive (Tape) Itching and Rash    Patient Measurements: Height: 5\' 1"  (154.9 cm) Weight: 344 lb 8 oz (156.264 kg) IBW/kg (Calculated) : 47.8   Vital Signs: Temp: 97.7 F (36.5 C) (04/19 0650) Temp src: Oral (04/19 0650) BP: 134/73 mmHg (04/19 0650) Pulse Rate: 97  (04/19 0650) Intake/Output from previous day:   Intake/Output from this shift: Total I/O In: -  Out: 300 [Urine:300]  Labs:  [facility] 08/05/11 0833 08/05/11 0006  WBC 8.3 12.0*  HGB 11.4* 11.9*  PLT 323 359  LABCREA -- --  CREATININE 0.60 0.70   Estimated Creatinine Clearance: 130.5 ml/min (by C-G formula based on Cr of 0.6). No results found for this basename: VANCOTROUGH:2,VANCOPEAK:2,VANCORANDOM:2,GENTTROUGH:2,GENTPEAK:2,GENTRANDOM:2,TOBRATROUGH:2,TOBRAPEAK:2,TOBRARND:2,AMIKACINPEAK:2,AMIKACINTROU:2,AMIKACIN:2, in the last 72 hours   Microbiology: No results found for this or any previous visit (from the past 720 hour(s)).  Medical History: Past Medical History  Diagnosis Date  . Diabetes mellitus   . GERD (gastroesophageal reflux disease)    Assessment:  41 yo F presenting with swelling and erythema of BL LE. Pharmacy consulted to dose vancomycin for cellulitis. Prior admission in 02/2011 for cellulitis, pt received vanc 1500mg  IV q12hr with therapeutic trough at 11.7 mcg/mL. Pts renal function remains stable.     Goal of Therapy:  Vancomycin trough level 10-15 mcg/ml   Plan:  - Give vanc 2,500mg  IV load then give 1,500mg  IV q12hr - Monitor renal function, clinical course, vanc trough at SS if indicated    Su Hilt, PharmD Candidate [place] Aus D. Laney Potash, PharmD, BCPS Pager:  [phone] 08/05/2011, 11:14 AM

## 2011-08-05 NOTE — Progress Notes (Signed)
Inpatient Diabetes Program Recommendations  AACE/ADA: New Consensus Statement on Inpatient Glycemic Control (2009)  Target Ranges:  Prepandial:   less than 140 mg/dL      Peak postprandial:   less than 180 mg/dL (1-2 hours)      Critically ill patients:  140 - 180 mg/dL   Reason for Visit: Results for [name], [name] (MRN [id number]) as of 08/05/2011 14:39  Ref. Range 08/05/2011 06:57 08/05/2011 11:28  Glucose-Capillary Latest Range: 70-99 mg/dL 409 (H) 811 (H)   Note history of diabetes.  A1C in November of 2012 was 9.4%.  Patient admits that she does not check CBG's at home.  Current A1C pending.  If CBG's remain greater than 180 mg/dL, may consider adding Lantus 20 units daily (titrate according to fasting CBG's). Would benefit from further education and follow-up of diabetes. Consider outpatient diabetes education at discharge.

## 2011-08-05 NOTE — ED Notes (Signed)
Patient is resting comfortably. 

## 2011-08-05 NOTE — H&P (Signed)
History and Physical       Hospital Admission Note Date: 08/05/2011  Patient name: [name] Medical record number: [id number] Date of birth: 1971-02-05 Age: 41 y.o. Gender: female PCP: Herb Grays, MD, MD   Chief Complaint:  Bilateral lower extremity cellulitis  HPI: Patient is a 41 year old Caucasian female with morbid obesity, diabetes mellitus, GERD presented to ED with cellulitis of the lower legs. History was obtained from the patient who stated that she noticed erythema and increased swelling in her right lower leg on Tuesday 4 days ago. She also had some swelling and erythema of left lower extremity, which appears to be resolving after starting antibiotics in the emergency room. She states that presentation is similar to her previous episodes or cellulitis. She denies any trauma to the legs. She also endorsed having some nausea with low-grade fevers and chills. She denies any abdominal pain, diarrhea, constipation any metaplasia melena. She also noticed some blistering on the right lower leg.   Review of Systems:  Constitutional:  see history of present illness   HEENT: Denies photophobia, eye pain, redness, hearing loss, ear pain, congestion, sore throat, rhinorrhea, sneezing, mouth sores, trouble swallowing, neck pain, neck stiffness and tinnitus.   Respiratory: Denies SOB, DOE, cough, chest tightness,  and wheezing.   Cardiovascular: Denies chest pain, palpitations and leg swelling.  Gastrointestinal: Denies vomiting, abdominal pain, diarrhea, constipation, blood in stool and abdominal distention. + nausea  Genitourinary: Denies dysuria, urgency, frequency, hematuria, flank pain and difficulty urinating.  Musculoskeletal: Denies myalgias, back pain, joint swelling, arthralgias and gait problem.  Skin:  see history of present illness  Neurological: Denies dizziness, seizures, syncope, weakness, light-headedness,  numbness and headaches.  Hematological: Denies adenopathy. Easy bruising, personal or family bleeding history  Psychiatric/Behavioral: Denies suicidal ideation, mood changes, confusion, nervousness, sleep disturbance and agitation  Past Medical History: Past Medical History  Diagnosis Date  . Diabetes mellitus   . GERD (gastroesophageal reflux disease)    Past Surgical History  Procedure Date  . Cholecystectomy   . Knee arthroscopy   . Laparoscopic tubal ligation 04/29/2011    Procedure: LAPAROSCOPIC TUBAL LIGATION;  Surgeon: Turner Daniels, MD;  Location: [facility];  Service: Gynecology;  Laterality: Bilateral;  with Filshie Clips    Medications: Prior to Admission medications   Medication Sig Start Date End Date Taking? Authorizing Provider  glipiZIDE (GLUCOTROL) 5 MG tablet Take 1 tablet (5 mg total) by mouth daily before breakfast. 02/22/11 02/22/12 Yes Standley Brooking, MD  metFORMIN (GLUCOPHAGE) 500 MG tablet Take 1 tablet (500 mg total) by mouth 2 (two) times daily with a meal. 02/22/11 02/22/12 Yes Standley Brooking, MD  neomycin-bacitracin-polymyxin (NEOSPORIN) OINT Apply 1 application topically daily as needed. Patient used this medication for her foot.   Yes Historical Provider, MD  omeprazole (PRILOSEC OTC) 20 MG tablet Take 20 mg by mouth daily. OTC Prilosec   Yes Historical Provider, MD    Allergies:   Allergies  Allergen Reactions  . Adhesive (Tape) Itching and Rash    Social History:  reports that she has been smoking Cigarettes.  She has been smoking about .5 packs per day. She does not have any smokeless tobacco history on file. She reports that she drinks alcohol. She reports that she does not use illicit drugs.  Family History: History reviewed. No pertinent family history.  Physical Exam: Blood pressure 134/73, pulse 97, temperature 97.7 F (36.5 C), temperature source Oral, resp. rate 16, height 5\' 1"  (1.549 m),  weight 156.264 kg (344 lb 8 oz), SpO2  94.00%. General: Alert, awake, oriented x3, in no acute distress. HEENT: anicteric sclera, pink conjunctiva, pupils equal and reactive to light and accomodation Neck: supple, no masses or lymphadenopathy, no goiter, no bruits  Heart: Regular rate and rhythm, without murmurs, rubs or gallops. Lungs: Clear to auscultation bilaterally, no wheezing, rales or rhonchi. Abdomen: Soft, nontender, nondistended, positive bowel sounds, no masses. Extremities: Bilateral lower extremity edema with erythema worse on right with blistering  Neuro: Grossly intact, no focal neurological deficits, strength 5/5 upper and lower extremities bilaterally Psych: alert and oriented x 3, normal mood and affect Skin: see  extremity exam   LABS on Admission:  Basic Metabolic Panel:  Lab 08/05/11 9604  NA 138  K 3.9  CL 101  CO2 27  GLUCOSE 272*  BUN 13  CREATININE 0.70  CALCIUM 9.1  MG --  PHOS --     Lab 08/05/11 0006  WBC 12.0*  NEUTROABS 8.1*  HGB 11.9*  HCT 36.2  MCV 77.7*  PLT 359   CBG:  Lab 08/05/11 0657  GLUCAP 214*     Radiological Exams on Admission: No results found.  Assessment/Plan Present on Admission:   .Cellulitis - Admit to MedSurg, start vancomycin, elevate legs - Obtain Doppler ultrasound of the LE's to rule out DVT, CT scan of the right lower extremity to rule out any abscess/fasciitis   .Peripheral edema - Start Lasix for pedal edema   .Diabetes mellitus - Obtain HbA1c, place on sliding scale insulin   .GERD (gastroesophageal reflux disease) - Continue PPI   .Obesity - Patient was counseled strongly to lose weight, diet control and exercise  DVT prophylaxis: Lovenox  CODE STATUS:full code  Further plan will depend as patient's clinical course evolves and further radiologic and laboratory data become available.   @Time  Spent on Admission: 45 minutes  Chae Shuster M.D. [facility] 08/05/2011, 8:03 AM

## 2011-08-05 NOTE — ED Notes (Signed)
Family at bedside. 

## 2011-08-06 LAB — CBC
HCT: 36.6 % (ref 36.0–46.0)
Hemoglobin: 11.2 g/dL — ABNORMAL LOW (ref 12.0–15.0)
MCH: 24.8 pg — ABNORMAL LOW (ref 26.0–34.0)
MCHC: 30.6 g/dL (ref 30.0–36.0)
MCV: 81 fL (ref 78.0–100.0)
Platelets: 325 10*3/uL (ref 150–400)
RBC: 4.52 MIL/uL (ref 3.87–5.11)
RDW: 16.2 % — ABNORMAL HIGH (ref 11.5–15.5)
WBC: 7.6 10*3/uL (ref 4.0–10.5)

## 2011-08-06 LAB — BASIC METABOLIC PANEL
BUN: 10 mg/dL (ref 6–23)
CO2: 29 mEq/L (ref 19–32)
Calcium: 8.6 mg/dL (ref 8.4–10.5)
Chloride: 102 mEq/L (ref 96–112)
Creatinine, Ser: 0.78 mg/dL (ref 0.50–1.10)
GFR calc Af Amer: >90 mL/min (ref >90)
GFR calc non Af Amer: >90 mL/min (ref >90)
Glucose, Bld: 182 mg/dL — ABNORMAL HIGH (ref 70–99)
Potassium: 4.2 mEq/L (ref 3.5–5.1)
Sodium: 138 mEq/L (ref 135–145)

## 2011-08-06 LAB — GLUCOSE, CAPILLARY
Glucose-Capillary: 180 mg/dL — ABNORMAL HIGH (ref 70–99)
Glucose-Capillary: 189 mg/dL — ABNORMAL HIGH (ref 70–99)
Glucose-Capillary: 158 mg/dL — ABNORMAL HIGH (ref 70–99)
Glucose-Capillary: 200 mg/dL — ABNORMAL HIGH (ref 70–99)

## 2011-08-06 MED ORDER — INSULIN GLARGINE 100 UNIT/ML ~~LOC~~ SOLN
10.0000 [IU] | Freq: Every day | SUBCUTANEOUS | Status: DC
  Administered 2011-08-06: 10 [IU] via SUBCUTANEOUS

## 2011-08-06 MED ORDER — PIPERACILLIN-TAZOBACTAM 3.375 G IVPB
3.3750 g | Freq: Three times a day (TID) | INTRAVENOUS | Status: DC
  Administered 2011-08-06 – 2011-08-08 (×6): 3.375 g via INTRAVENOUS
  Filled 2011-08-06 (×7): qty 50

## 2011-08-06 NOTE — Progress Notes (Signed)
Subjective: Patient seen and examined, feels better. Legs still have mild erythema  Objective: Vital signs in last 24 hours: Temp:  [97.3 F (36.3 C)-98.2 F (36.8 C)] 97.3 F (36.3 C) (04/20 1400) Pulse Rate:  [72-91] 91  (04/20 1400) Resp:  [18] 18  (04/20 1400) BP: (116-132)/(50-78) 116/50 mmHg (04/20 1400) SpO2:  [95 %-97 %] 97 % (04/20 1400) Weight change:  Last BM Date: 08/04/11  Intake/Output from previous day: 04/19 0701 - 04/20 0700 In: 1360 [P.O.:360; IV Piggyback:1000] Out: 2500 [Urine:2500] Total I/O In: 720 [P.O.:720] Out: 300 [Urine:300]   Physical Exam: HEENT: Atraumatic, normocephalic Neck: Supple Chest : Clear to auscultation bilaterally, no wheezing, no crackles Heart : S1 S2 Regular, no murmurs Abdomen: Soft, Nontender, no organomegaly Ext : Chronic edema both legs, mild erthema   Lab Results: Basic Metabolic Panel:  Basename 08/06/11 0700 08/05/11 0833 08/05/11 0006  NA 138 -- 138  K 4.2 -- 3.9  CL 102 -- 101  CO2 29 -- 27  GLUCOSE 182* -- 272*  BUN 10 -- 13  CREATININE 0.78 0.60 --  CALCIUM 8.6 -- 9.1  MG -- -- --  PHOS -- -- --   Liver Function Tests: No results found for this basename: AST:2,ALT:2,ALKPHOS:2,BILITOT:2,PROT:2,ALBUMIN:2 in the last 72 hours No results found for this basename: LIPASE:2,AMYLASE:2 in the last 72 hours No results found for this basename: AMMONIA:2 in the last 72 hours CBC:  Basename 08/06/11 0700 08/05/11 0833 08/05/11 0006  WBC 7.6 8.3 --  NEUTROABS -- -- 8.1*  HGB 11.2* 11.4* --  HCT 36.6 35.9* --  MCV 81.0 79.6 --  PLT 325 323 --   Cardiac Enzymes: No results found for this basename: CKTOTAL:3,CKMB:3,CKMBINDEX:3,TROPONINI:3 in the last 72 hours BNP: No results found for this basename: PROBNP:3 in the last 72 hours D-Dimer: No results found for this basename: DDIMER:2 in the last 72 hours CBG:  Basename 08/06/11 1153 08/06/11 0746 08/05/11 2152 08/05/11 1657 08/05/11 1128 08/05/11 0657  GLUCAP  189* 180* 188* 158* 161* 214*   Hemoglobin A1C:  Basename 08/05/11 0833  HGBA1C 9.3*   Fasting Lipid Panel: No results found for this basename: CHOL,HDL,LDLCALC,TRIG,CHOLHDL,LDLDIRECT in the last 72 hours Thyroid Function Tests: No results found for this basename: TSH,T4TOTAL,FREET4,T3FREE,THYROIDAB in the last 72 hours Anemia Panel: No results found for this basename: VITAMINB12,FOLATE,FERRITIN,TIBC,IRON,RETICCTPCT in the last 72 hours Coagulation: No results found for this basename: LABPROT:2,INR:2 in the last 72 hours Urine Drug Screen: Drugs of Abuse  No results found for this basename: labopia, cocainscrnur, labbenz, amphetmu, thcu, labbarb    Alcohol Level: No results found for this basename: ETH:2 in the last 72 hours Urinalysis: No results found for this basename: COLORURINE:2,APPERANCEUR:2,LABSPEC:2,PHURINE:2,GLUCOSEU:2,HGBUR:2,BILIRUBINUR:2,KETONESUR:2,PROTEINUR:2,UROBILINOGEN:2,NITRITE:2,LEUKOCYTESUR:2 in the last 72 hours Misc. Labs:  No results found for this or any previous visit (from the past 240 hour(s)).  Studies/Results: Ct Tibia Fibula Right W Contrast  08/05/2011  *RADIOLOGY REPORT*  Clinical Data: Cellulitis with blistering, query abscess.  CT OF THE RIGHT TIBIA AND FIBULA WITH CONTRAST  Contrast: OMNIPAQUE IOHEXOL 300 MG/ML  SOLN  Comparison: 06/21/2011 radiographs of the knee.  Findings: Degenerative patellofemoral and medial compartmental spurring noted in the knee.  No bony destructive findings in the tibia or fibula to suggest osteomyelitis.  Plantar and Achilles calcaneal spurs noted. Posteromedial to the knee, there is abnormal subcutaneous edema and skin thickening which could reflect cellulitis.  No drainable abscess noted in this vicinity.  In the calf, there is prominent diffuse subcutaneous edema with minimal lateral  sparing.  The fluid, although confluent, does not demonstrate enhancing margins or characteristic appearance for subcutaneous  abscess.  Subcutaneous edema overlies the medial malleolus and probably tracks into the foot.  The edema appears to spare of the deep fascial planes and musculature, with no findings of muscular abscess.  Regional vasculature appears patent.  IMPRESSION:  1.  Extensive subcutaneous edema in the calf appears infiltrative. No pocket of fluid with enhancing margins is observed to suggest abscess. 2.  There is relative sparing of deep fascial planes in no compelling findings of myositis.  No muscular abscess observed. 3.  Osteoarthritis of the knee. 4.  Plantar and Achilles calcaneal spurs.  Original Report Authenticated By: Dellia Cloud, M.D.    Medications: Scheduled Meds:   . enoxaparin  40 mg Subcutaneous Q24H  . furosemide  40 mg Oral Daily  . insulin aspart  0-15 Units Subcutaneous TID WC  . insulin aspart  0-5 Units Subcutaneous QHS  . pantoprazole  40 mg Oral Q1200  . vancomycin  1,500 mg Intravenous Q12H   Continuous Infusions:  PRN Meds:.acetaminophen, acetaminophen, alum & mag hydroxide-simeth, diphenhydrAMINE, HYDROcodone-acetaminophen, HYDROmorphone, ondansetron (ZOFRAN) IV, ondansetron  Assessment/Plan:  .Cellulitis  On vancomycin Start Zosyn per pharmacy as not responding with vanc alone  .Peripheral edema  Lasix for pedal edema   .Diabetes mellitus   place on sliding scale insulin  Start lantus 10 units at bedtime Hb a1c is 9.3  .GERD (gastroesophageal reflux disease)  Continue PPI   .Obesity  Patient was counseled strongly to lose weight, diet control and exercise    LOS: 2 days   [facility] Pager: [phone] 08/06/2011, 3:36 PM

## 2011-08-06 NOTE — Progress Notes (Addendum)
ANTIBIOTIC CONSULT NOTE - INITIAL  Pharmacy Consult for Vancomycin/zosyn Indication: bilateral cellulitis  Allergies  Allergen Reactions  . Adhesive (Tape) Itching and Rash    Patient Measurements: Height: 5\' 1"  (154.9 cm) Weight: 344 lb 8 oz (156.264 kg) IBW/kg (Calculated) : 47.8   Vital Signs: Temp: 97.3 F (36.3 C) (04/20 1400) Temp src: Oral (04/20 0700) BP: 116/50 mmHg (04/20 1400) Pulse Rate: 91  (04/20 1400) Intake/Output from previous day: 04/19 0701 - 04/20 0700 In: 1360 [P.O.:360; IV Piggyback:1000] Out: 2500 [Urine:2500] Intake/Output from this shift: Total I/O In: 720 [P.O.:720] Out: 300 [Urine:300]  Labs:  [facility] 08/06/11 0700 08/05/11 0833 08/05/11 0006  WBC 7.6 8.3 12.0*  HGB 11.2* 11.4* 11.9*  PLT 325 323 359  LABCREA -- -- --  CREATININE 0.78 0.60 0.70   Estimated Creatinine Clearance: 130.5 ml/min (by C-G formula based on Cr of 0.78). No results found for this basename: VANCOTROUGH:2,VANCOPEAK:2,VANCORANDOM:2,GENTTROUGH:2,GENTPEAK:2,GENTRANDOM:2,TOBRATROUGH:2,TOBRAPEAK:2,TOBRARND:2,AMIKACINPEAK:2,AMIKACINTROU:2,AMIKACIN:2, in the last 72 hours   Microbiology: No results found for this or any previous visit (from the past 720 hour(s)).  Medical History: Past Medical History  Diagnosis Date  . Diabetes mellitus   . GERD (gastroesophageal reflux disease)   . Cellulitis 07/2011   Assessment:  41 yo F presenting with swelling and erythema of BL LE. Pharmacy consulted to dose vancomycin for cellulitis. Prior admission in 02/2011 for cellulitis, pt received vanc 1500mg  IV q12hr with therapeutic trough at 11.7 mcg/mL. Pts renal function remains stable.  Orders to add zosyn today  Goal of Therapy:  Vancomycin trough level 10-15 mcg/ml   Plan:  - Continue vancomycin 1,500mg  IV q12hr - Monitor renal function, clinical course, vanc trough at SS if indicated  - Zosyn 3.375gm IV q8h with 4h extended infusion   Juliette Alcide, PharmD, BCPS.   Pager: [phone] 08/06/2011, 3:49 PM

## 2011-08-07 LAB — GLUCOSE, CAPILLARY
Glucose-Capillary: 162 mg/dL — ABNORMAL HIGH (ref 70–99)
Glucose-Capillary: 204 mg/dL — ABNORMAL HIGH (ref 70–99)
Glucose-Capillary: 146 mg/dL — ABNORMAL HIGH (ref 70–99)
Glucose-Capillary: 200 mg/dL — ABNORMAL HIGH (ref 70–99)

## 2011-08-07 MED ORDER — INSULIN GLARGINE 100 UNIT/ML ~~LOC~~ SOLN
20.0000 [IU] | Freq: Every day | SUBCUTANEOUS | Status: DC
  Administered 2011-08-07: 20 [IU] via SUBCUTANEOUS

## 2011-08-07 MED ORDER — GLIPIZIDE 5 MG PO TABS
5.0000 mg | ORAL_TABLET | Freq: Every day | ORAL | Status: DC
  Administered 2011-08-08 – 2011-08-10 (×3): 5 mg via ORAL
  Filled 2011-08-07 (×5): qty 1

## 2011-08-07 MED ORDER — ENOXAPARIN SODIUM 80 MG/0.8ML ~~LOC~~ SOLN
80.0000 mg | SUBCUTANEOUS | Status: DC
  Administered 2011-08-08 – 2011-08-10 (×3): 80 mg via SUBCUTANEOUS
  Filled 2011-08-07 (×3): qty 0.8

## 2011-08-07 MED ORDER — BIOTENE DRY MOUTH MT LIQD: 15.0000 mL | Freq: Two times a day (BID) | OROMUCOSAL | Status: DC

## 2011-08-07 MED ORDER — CHLORHEXIDINE GLUCONATE 0.12 % MT SOLN: 15.0000 mL | Freq: Two times a day (BID) | OROMUCOSAL | Status: DC

## 2011-08-07 NOTE — Progress Notes (Signed)
Subjective: Patient seen and examined, c/o nausea, headache.  Objective: Vital signs in last 24 hours: Temp:  [97.3 F (36.3 C)-98.1 F (36.7 C)] 97.7 F (36.5 C) (04/21 0605) Pulse Rate:  [78-120] 78  (04/21 0605) Resp:  [18] 18  (04/21 0605) BP: (98-130)/(50-69) 102/60 mmHg (04/21 0605) SpO2:  [96 %-98 %] 96 % (04/21 0605) Weight change:  Last BM Date: 08/07/11  Intake/Output from previous day: 04/20 0701 - 04/21 0700 In: 1580 [P.O.:1080; IV Piggyback:500] Out: 2200 [Urine:2200] Total I/O In: 480 [P.O.:480] Out: 300 [Urine:300]   Physical Exam: HEENT: Atraumatic, normocephalic Neck: Supple Chest : Clear to auscultation bilaterally, no wheezing, no crackles Heart : S1 S2 Regular, no murmurs Abdomen: Soft, Nontender, no organomegaly Ext : Chronic edema both legs, mild erythema (Improving)   Lab Results: Basic Metabolic Panel:  Basename 08/06/11 0700 08/05/11 0833 08/05/11 0006  NA 138 -- 138  K 4.2 -- 3.9  CL 102 -- 101  CO2 29 -- 27  GLUCOSE 182* -- 272*  BUN 10 -- 13  CREATININE 0.78 0.60 --  CALCIUM 8.6 -- 9.1  MG -- -- --  PHOS -- -- --   CBC:  Basename 08/06/11 0700 08/05/11 0833 08/05/11 0006  WBC 7.6 8.3 --  NEUTROABS -- -- 8.1*  HGB 11.2* 11.4* --  HCT 36.6 35.9* --  MCV 81.0 79.6 --  PLT 325 323 --   Cardiac Enzymes: No results found for this basename: CKTOTAL:3,CKMB:3,CKMBINDEX:3,TROPONINI:3 in the last 72 hours BNP: No results found for this basename: PROBNP:3 in the last 72 hours D-Dimer: No results found for this basename: DDIMER:2 in the last 72 hours CBG:  Basename 08/07/11 1146 08/07/11 0813 08/06/11 2225 08/06/11 1653 08/06/11 1153 08/06/11 0746  GLUCAP 204* 162* 200* 158* 189* 180*   Hemoglobin A1C:  Basename 08/05/11 0833  HGBA1C 9.3*   Fasting Lipid Panel: No results found for this basename: CHOL,HDL,LDLCALC,TRIG,CHOLHDL,LDLDIRECT in the last 72 hours Thyroid Function Tests: No results found for this basename:  TSH,T4TOTAL,FREET4,T3FREE,THYROIDAB in the last 72 hours Anemia Panel: No results found for this basename: VITAMINB12,FOLATE,FERRITIN,TIBC,IRON,RETICCTPCT in the last 72 hours Coagulation: No results found for this basename: LABPROT:2,INR:2 in the last 72 hours Urine Drug Screen: Drugs of Abuse  No results found for this basename: labopia,  cocainscrnur,  labbenz,  amphetmu,  thcu,  labbarb    Alcohol Level: No results found for this basename: ETH:2 in the last 72 hours Urinalysis: No results found for this basename: COLORURINE:2,APPERANCEUR:2,LABSPEC:2,PHURINE:2,GLUCOSEU:2,HGBUR:2,BILIRUBINUR:2,KETONESUR:2,PROTEINUR:2,UROBILINOGEN:2,NITRITE:2,LEUKOCYTESUR:2 in the last 72 hours Misc. Labs:  No results found for this or any previous visit (from the past 240 hour(s)).  Studies/Results: No results found.  Medications: Scheduled Meds:    . enoxaparin  40 mg Subcutaneous Q24H  . furosemide  40 mg Oral Daily  . insulin aspart  0-15 Units Subcutaneous TID WC  . insulin aspart  0-5 Units Subcutaneous QHS  . insulin glargine  10 Units Subcutaneous QHS  . pantoprazole  40 mg Oral Q1200  . piperacillin-tazobactam (ZOSYN)  IV  3.375 g Intravenous Q8H  . vancomycin  1,500 mg Intravenous Q12H  . DISCONTD: antiseptic oral rinse  15 mL Mouth Rinse q12n4p  . DISCONTD: chlorhexidine  15 mL Mouth Rinse BID   Continuous Infusions:  PRN Meds:.acetaminophen, acetaminophen, alum & mag hydroxide-simeth, diphenhydrAMINE, HYDROcodone-acetaminophen, HYDROmorphone, ondansetron (ZOFRAN) IV, ondansetron  Assessment/Plan:  .Cellulitis  On vancomycin, Zosyn If patient feels better, she can be discharged in am on Po Bactrim   .Peripheral edema  Lasix for pedal edema   .  Diabetes mellitus  BG still elevated  place on sliding scale insulin  Increase lantus to 20 units at bedtime Hb a1c is 9.3  .GERD (gastroesophageal reflux disease)  Continue PPI   .Obesity  Patient was counseled strongly to  lose weight, diet control and exercise    LOS: 3 days   [facility] S [facility] Pager: [phone] 08/07/2011, 1:23 PM

## 2011-08-07 NOTE — Progress Notes (Signed)
Bed alarm not needed

## 2011-08-08 DIAGNOSIS — L02419 Cutaneous abscess of limb, unspecified: Secondary | ICD-10-CM

## 2011-08-08 DIAGNOSIS — L03119 Cellulitis of unspecified part of limb: Secondary | ICD-10-CM

## 2011-08-08 LAB — GLUCOSE, CAPILLARY
Glucose-Capillary: 140 mg/dL — ABNORMAL HIGH (ref 70–99)
Glucose-Capillary: 103 mg/dL — ABNORMAL HIGH (ref 70–99)
Glucose-Capillary: 183 mg/dL — ABNORMAL HIGH (ref 70–99)

## 2011-08-08 LAB — VANCOMYCIN, TROUGH: Vancomycin Tr: 13.2 ug/mL (ref 10.0–20.0)

## 2011-08-08 MED ORDER — CEFAZOLIN SODIUM-DEXTROSE 2-3 GM-% IV SOLR
2.0000 g | Freq: Three times a day (TID) | INTRAVENOUS | Status: DC
  Administered 2011-08-08 – 2011-08-10 (×6): 2 g via INTRAVENOUS
  Filled 2011-08-08 (×8): qty 50

## 2011-08-08 MED ORDER — INSULIN GLARGINE 100 UNIT/ML ~~LOC~~ SOLN
25.0000 [IU] | Freq: Every day | SUBCUTANEOUS | Status: DC
  Administered 2011-08-08 – 2011-08-09 (×2): 25 [IU] via SUBCUTANEOUS

## 2011-08-08 MED ORDER — CEFAZOLIN SODIUM-DEXTROSE 2-3 GM-% IV SOLR
2.0000 g | Freq: Three times a day (TID) | INTRAVENOUS | Status: DC
  Filled 2011-08-08 (×2): qty 50

## 2011-08-08 NOTE — Consult Note (Signed)
Date of Admission:  08/04/2011  Date of Consult:  08/08/2011  Reason for Consult: cellulitis Referring Physician: Dr. Isidoro Donning   HPI: [name] is an 41 y.o. female. With DM morbid obesity, and recurrent LE cellulitis most recently rx in NOvember with IV vanco and zosyn via PICC line> She was admitted on 08/05/11 and started on vancomycin which was then escalated to vanco and  zosyn following day.   Past Medical History  Diagnosis Date  . Diabetes mellitus   . GERD (gastroesophageal reflux disease)   . Cellulitis 07/2011    Past Surgical History  Procedure Date  . Cholecystectomy   . Knee arthroscopy   . Laparoscopic tubal ligation 04/29/2011    Procedure: LAPAROSCOPIC TUBAL LIGATION;  Surgeon: Turner Daniels, MD;  Location: [facility];  Service: Gynecology;  Laterality: Bilateral;  with Filshie Clips  ergies:   Allergies  Allergen Reactions  . Adhesive (Tape) Itching and Rash     Medications: I have reviewed patients current medications as documented in Epic Anti-infectives     Start     Dose/Rate Route Frequency Ordered Stop   08/08/11 1400   ceFAZolin (ANCEF) IVPB 2 g/50 mL premix  Status:  Discontinued        2 g 100 mL/hr over 30 Minutes Intravenous 3 times per day 08/08/11 1118 08/08/11 1121   08/08/11 1200   ceFAZolin (ANCEF) IVPB 2 g/50 mL premix        2 g 100 mL/hr over 30 Minutes Intravenous Every 8 hours 08/08/11 1121     08/06/11 1700   piperacillin-tazobactam (ZOSYN) IVPB 3.375 g  Status:  Discontinued        3.375 g 12.5 mL/hr over 240 Minutes Intravenous Every 8 hours 08/06/11 1555 08/08/11 1111   08/06/11 0100   vancomycin (VANCOCIN) 1,500 mg in sodium chloride 0.9 % 500 mL IVPB        1,500 mg 250 mL/hr over 120 Minutes Intravenous Every 12 hours 08/05/11 1116     08/05/11 1200   vancomycin (VANCOCIN) 2,500 mg in sodium chloride 0.9 % 500 mL IVPB        2,500 mg 250 mL/hr over 120 Minutes Intravenous  Once 08/05/11 1116 08/05/11 1404   08/05/11 0015    vancomycin (VANCOCIN) IVPB 1000 mg/200 mL premix        1,000 mg 200 mL/hr over 60 Minutes Intravenous  Once 08/05/11 0001 08/05/11 0119          Social History:  reports that she quit smoking about 2 weeks ago. Her smoking use included Cigarettes. She smoked .5 packs per day. She does not have any smokeless tobacco history on file. She reports that she drinks alcohol. She reports that she does not use illicit drugs.  History reviewed. No pertinent family history.  As in HPI and primary teams notes otherwise 12 point review of systems is negative  Blood pressure 93/45, pulse 85, temperature 97.8 F (36.6 C), temperature source Oral, resp. rate 18, height 5\' 1"  (1.549 m), weight 344 lb 8 oz (156.264 kg), SpO2 95.00%. General: Alert and awake, oriented x3, not in any acute distress. HEENT: anicteric sclera, pupils reactive to light and accommodation, EOMI, oropharynx clear and without exudate CVS regular rate, normal r,  no murmur rubs or gallops Chest: clear to auscultation bilaterally, no wheezing, rales or rhonchi Abdomen: soft nontender, nondistended, normal bowel sounds,  Skin: she has two patchy areas anteriorly of erythema on leg R>>L and right with some  bullous areas Neuro: nonfocal, strength and sensation intact   Results for orders placed during the hospital encounter of 08/04/11 (from the past 48 hour(s))  GLUCOSE, CAPILLARY     Status: Abnormal   Collection Time   08/06/11  4:53 PM      Component Value Range Comment   Glucose-Capillary 158 (*) 70 - 99 (mg/dL)    Comment 1 Documented in Chart      Comment 2 Notify RN     GLUCOSE, CAPILLARY     Status: Abnormal   Collection Time   08/06/11 10:25 PM      Component Value Range Comment   Glucose-Capillary 200 (*) 70 - 99 (mg/dL)   GLUCOSE, CAPILLARY     Status: Abnormal   Collection Time   08/07/11  8:13 AM      Component Value Range Comment   Glucose-Capillary 162 (*) 70 - 99 (mg/dL)    Comment 1 Notify RN       GLUCOSE, CAPILLARY     Status: Abnormal   Collection Time   08/07/11 11:46 AM      Component Value Range Comment   Glucose-Capillary 204 (*) 70 - 99 (mg/dL)    Comment 1 Documented in Chart      Comment 2 Notify RN     GLUCOSE, CAPILLARY     Status: Abnormal   Collection Time   08/07/11  4:56 PM      Component Value Range Comment   Glucose-Capillary 146 (*) 70 - 99 (mg/dL)    Comment 1 Documented in Chart      Comment 2 Notify RN     GLUCOSE, CAPILLARY     Status: Abnormal   Collection Time   08/07/11  9:57 PM      Component Value Range Comment   Glucose-Capillary 200 (*) 70 - 99 (mg/dL)   GLUCOSE, CAPILLARY     Status: Abnormal   Collection Time   08/08/11  8:32 AM      Component Value Range Comment   Glucose-Capillary 140 (*) 70 - 99 (mg/dL)    Comment 1 Notify RN     GLUCOSE, CAPILLARY     Status: Abnormal   Collection Time   08/08/11 12:46 PM      Component Value Range Comment   Glucose-Capillary 103 (*) 70 - 99 (mg/dL)    Comment 1 Notify RN         Component Value Date/Time   SDES BLOOD LEFT LOWER ARM 12/30/2008 0545   SPECREQUEST BOTTLES DRAWN AEROBIC AND ANAEROBIC 10CC BLUE,7CC RED 12/30/2008 0545   CULT NO GROWTH 5 DAYS 12/30/2008 0545   REPTSTATUS 01/05/2009 FINAL 12/30/2008 0545   No results found.   No results found for this or any previous visit (from the past 720 hour(s)).   Impression/Recommendation 41 yo with DM, morbid obesity and undoubted damage to lymphatics due to recurrent cellulitis.   1) Recurrent cellulitis: Without any open wounds or exposure to hot tubs or other risk factors for Pseudomonas the zosyn is empiric  "overkill". 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

## 2011-08-08 NOTE — Progress Notes (Signed)
ANTIBIOTIC CONSULT NOTE - INITIAL  Pharmacy Consult for Vancomycin Indication: bilateral cellulitis  Allergies  Allergen Reactions  . Adhesive (Tape) Itching and Rash    Patient Measurements: Height: 5\' 1"  (154.9 cm) Weight: 344 lb 8 oz (156.264 kg) IBW/kg (Calculated) : 47.8   Vital Signs: Temp: 97.5 F (36.4 C) (04/22 1448) Temp src: Oral (04/22 1448) BP: 159/92 mmHg (04/22 1448) Pulse Rate: 108  (04/22 1448) Intake/Output from previous day: 04/21 0701 - 04/22 0700 In: 1320 [P.O.:1320] Out: 300 [Urine:300] Intake/Output from this shift: Total I/O In: 100 [P.O.:100] Out: -   Labs:  Basename 08/06/11 0700  WBC 7.6  HGB 11.2*  PLT 325  LABCREA --  CREATININE 0.78   Estimated Creatinine Clearance: 130.5 ml/min (by C-G formula based on Cr of 0.78).  Basename 08/08/11 1308  VANCOTROUGH 13.2  VANCOPEAK --  VANCORANDOM --  GENTTROUGH --  GENTPEAK --  GENTRANDOM --  TOBRATROUGH --  TOBRAPEAK --  TOBRARND --  AMIKACINPEAK --  AMIKACINTROU --  AMIKACIN --     Microbiology: No results found for this or any previous visit (from the past 720 hour(s)).  Medical History: Past Medical History  Diagnosis Date  . Diabetes mellitus   . GERD (gastroesophageal reflux disease)   . Cellulitis 07/2011   Assessment:  41 yo F presenting with swelling and erythema of BL LE. Pharmacy consulted to dose vancomycin for cellulitis. Prior admission in 02/2011 for cellulitis, pt received vanc 1500mg  IV q12hr with therapeutic trough at 11.7 mcg/mL. Pts renal function remains stable.  Zosyn changed to ancef.  Vanc trough therapeutic at 13.2 mg/L  Goal of Therapy:  Vancomycin trough level 10-15 mcg/ml   Plan:  - Continue vancomycin 1,500mg  IV q12hr Talbert Cage, PharmD 08/08/2011, 2:49 PM

## 2011-08-08 NOTE — Progress Notes (Signed)
ANTIBIOTIC CONSULT NOTE - FOLLOW UP  Pharmacy Consult for Ancef Indication: cellulitis  Allergies  Allergen Reactions  . Adhesive (Tape) Itching and Rash    Patient Measurements: Height: 5\' 1"  (154.9 cm) Weight: 344 lb 8 oz (156.264 kg) IBW/kg (Calculated) : 47.8    Vital Signs: Temp: 97.8 F (36.6 C) (04/22 0550) Temp src: Oral (04/22 0550) BP: 93/45 mmHg (04/22 0550) Pulse Rate: 85  (04/22 0550) Intake/Output from previous day: 04/21 0701 - 04/22 0700 In: 1320 [P.O.:1320] Out: 300 [Urine:300] Intake/Output from this shift:    Labs:  Basename 08/06/11 0700  WBC 7.6  HGB 11.2*  PLT 325  LABCREA --  CREATININE 0.78   Estimated Creatinine Clearance: 130.5 ml/min (by C-G formula based on Cr of 0.78). No results found for this basename: VANCOTROUGH:2,VANCOPEAK:2,VANCORANDOM:2,GENTTROUGH:2,GENTPEAK:2,GENTRANDOM:2,TOBRATROUGH:2,TOBRAPEAK:2,TOBRARND:2,AMIKACINPEAK:2,AMIKACINTROU:2,AMIKACIN:2, in the last 72 hours   Microbiology: No results found for this or any previous visit (from the past 720 hour(s)).  Anti-infectives     Start     Dose/Rate Route Frequency Ordered Stop   08/08/11 1400   ceFAZolin (ANCEF) IVPB 2 g/50 mL premix        2 g 100 mL/hr over 30 Minutes Intravenous 3 times per day 08/08/11 1118     08/06/11 1700   piperacillin-tazobactam (ZOSYN) IVPB 3.375 g  Status:  Discontinued        3.375 g 12.5 mL/hr over 240 Minutes Intravenous Every 8 hours 08/06/11 1555 08/08/11 1111   08/06/11 0100   vancomycin (VANCOCIN) 1,500 mg in sodium chloride 0.9 % 500 mL IVPB        1,500 mg 250 mL/hr over 120 Minutes Intravenous Every 12 hours 08/05/11 1116     08/05/11 1200   vancomycin (VANCOCIN) 2,500 mg in sodium chloride 0.9 % 500 mL IVPB        2,500 mg 250 mL/hr over 120 Minutes Intravenous  Once 08/05/11 1116 08/05/11 1404   08/05/11 0015   vancomycin (VANCOCIN) IVPB 1000 mg/200 mL premix        1,000 mg 200 mL/hr over 60 Minutes Intravenous  Once  08/05/11 0001 08/05/11 0119          Assessment: Already following patient on vancomycin for cellulitis Now adding Ancef  Goal of Therapy:  Appropriate dosing  Plan:  1) Ancef 2 grams iv Q 8 hours 2) Continue to follow  Thank you.  Elwin Sleight 08/08/2011,11:19 AM

## 2011-08-08 NOTE — Progress Notes (Signed)
Patient ID: [name]  female  [id number]    DOB: 1970-05-16    DOA: 08/04/2011  PCP: Herb Grays, MD, MD  Subjective: Patient feels extremely concerned that her left-leg cellulitis is returning back. She feels that she will not do well on PO antibiotics at home and did well on IV antibiotics after dc in 02/2011  Objective: Weight change:   Intake/Output Summary (Last 24 hours) at 08/08/11 0943 Last data filed at 08/07/11 1800  Gross per 24 hour  Intake   1320 ml  Output    300 ml  Net   1020 ml   Blood pressure 93/45, pulse 85, temperature 97.8 F (36.6 C), temperature source Oral, resp. rate 18, height 5\' 1"  (1.549 m), weight 156.264 kg (344 lb 8 oz), SpO2 95.00%.  Physical Exam: General: Alert and awake, oriented x3, not in any acute distress. HEENT: anicteric sclera, pupils reactive to light and accommodation, EOMI CVS: S1-S2 clear, no murmur rubs or gallops Chest: clear to auscultation bilaterally, no wheezing, rales or rhonchi Abdomen: soft nontender, nondistended, normal bowel sounds, no organomegaly Extremities: Chronic lymphedema with erythema significantly improved in the right lower extremity, left lower strength he also has mild erythema but with some induration, no tenderness  Neuro: Cranial nerves II-XII intact, no focal neurological deficits  Lab Results: Basic Metabolic Panel:  Lab 08/06/11 8119 08/05/11 0833 08/05/11 0006  NA 138 -- 138  K 4.2 -- 3.9  CL 102 -- 101  CO2 29 -- 27  GLUCOSE 182* -- 272*  BUN 10 -- 13  CREATININE 0.78 0.60 --  CALCIUM 8.6 -- 9.1  MG -- -- --  PHOS -- -- --    Lab 08/06/11 0700 08/05/11 0833 08/05/11 0006  WBC 7.6 8.3 --  NEUTROABS -- -- 8.1*  HGB 11.2* 11.4* --  HCT 36.6 35.9* --  MCV 81.0 79.6 --  PLT 325 323 --   CBG:  Lab 08/08/11 0832 08/07/11 2157 08/07/11 1656 08/07/11 1146 08/07/11 0813  GLUCAP 140* 200* 146* 204* 162*     Micro Results: No results found for this or any previous visit (from  the past 240 hour(s)).  Studies/Results: Ct Tibia Fibula Right W Contrast  08/05/2011  *RADIOLOGY REPORT*  Clinical Data: Cellulitis with blistering, query abscess.  CT OF THE RIGHT TIBIA AND FIBULA WITH CONTRAST  Contrast: OMNIPAQUE IOHEXOL 300 MG/ML  SOLN  Comparison: 06/21/2011 radiographs of the knee.  Findings: Degenerative patellofemoral and medial compartmental spurring noted in the knee.  No bony destructive findings in the tibia or fibula to suggest osteomyelitis.  Plantar and Achilles calcaneal spurs noted. Posteromedial to the knee, there is abnormal subcutaneous edema and skin thickening which could reflect cellulitis.  No drainable abscess noted in this vicinity.  In the calf, there is prominent diffuse subcutaneous edema with minimal lateral sparing.  The fluid, although confluent, does not demonstrate enhancing margins or characteristic appearance for subcutaneous abscess.  Subcutaneous edema overlies the medial malleolus and probably tracks into the foot.  The edema appears to spare of the deep fascial planes and musculature, with no findings of muscular abscess.  Regional vasculature appears patent.  IMPRESSION:  1.  Extensive subcutaneous edema in the calf appears infiltrative. No pocket of fluid with enhancing margins is observed to suggest abscess. 2.  There is relative sparing of deep fascial planes in no compelling findings of myositis.  No muscular abscess observed. 3.  Osteoarthritis of the knee. 4.  Plantar and Achilles calcaneal spurs.  Original Report Authenticated By: Dellia Cloud, M.D.    Medications: Scheduled Meds:   . enoxaparin (LOVENOX) injection  80 mg Subcutaneous Q24H  . furosemide  40 mg Oral Daily  . glipiZIDE  5 mg Oral QAC breakfast  . insulin aspart  0-15 Units Subcutaneous TID WC  . insulin aspart  0-5 Units Subcutaneous QHS  . insulin glargine  20 Units Subcutaneous QHS  . pantoprazole  40 mg Oral Q1200  . piperacillin-tazobactam (ZOSYN)  IV   3.375 g Intravenous Q8H  . vancomycin  1,500 mg Intravenous Q12H  . DISCONTD: antiseptic oral rinse  15 mL Mouth Rinse q12n4p  . DISCONTD: chlorhexidine  15 mL Mouth Rinse BID  . DISCONTD: enoxaparin  40 mg Subcutaneous Q24H  . DISCONTD: insulin glargine  10 Units Subcutaneous QHS   Continuous Infusions:    Assessment/Plan: Principal Problem:  *Cellulitis - Currently on vancomycin and Zosyn - Please see subjective, patient is concerned that she will not do well on PO antibiotics at home. She was apparently discharged on PICC line/vancomycin/Zosyn in 02/2011 and she feels that she will do well with IV antibiotics. Patient was on PO clindamycin 02/2011 and was not able to tolerate it secondary to nausea and vomiting, hence, she was DC'ed on IV Vanco and Zosyn. - Continue Lasix, elevate legs, ID consultation obtained, discussed with Dr. Algis Liming.   Active Problems:  Peripheral edema with morbid obesity: Patient was strongly counseled to lose weight, diet control, exercise   Diabetes mellitus: Not well controlled - Increase Lantus to 25 units, continue glipizide, sliding scale insulin   GERD (gastroesophageal reflux disease) - Continue PPI  DVT Prophylaxis: Lovenox  Code Status: Full code  Disposition: Hopefully tomorrow   LOS: 4 days   Modell Fendrick M.D. [facility] 08/08/2011, 9:43 AM Pager: [phone]

## 2011-08-09 LAB — GLUCOSE, CAPILLARY
Glucose-Capillary: 104 mg/dL — ABNORMAL HIGH (ref 70–99)
Glucose-Capillary: 96 mg/dL (ref 70–99)
Glucose-Capillary: 128 mg/dL — ABNORMAL HIGH (ref 70–99)
Glucose-Capillary: 129 mg/dL — ABNORMAL HIGH (ref 70–99)
Glucose-Capillary: 144 mg/dL — ABNORMAL HIGH (ref 70–99)

## 2011-08-09 LAB — HIV ANTIBODY (ROUTINE TESTING W REFLEX): HIV: NONREACTIVE

## 2011-08-09 NOTE — Progress Notes (Signed)
Subjective: No new complaints, feels erythema is improved  Antibiotics:  Anti-infectives     Start     Dose/Rate Route Frequency Ordered Stop   08/08/11 1400   ceFAZolin (ANCEF) IVPB 2 g/50 mL premix  Status:  Discontinued        2 g 100 mL/hr over 30 Minutes Intravenous 3 times per day 08/08/11 1118 08/08/11 1121   08/08/11 1200   ceFAZolin (ANCEF) IVPB 2 g/50 mL premix        2 g 100 mL/hr over 30 Minutes Intravenous Every 8 hours 08/08/11 1121     08/06/11 1700   piperacillin-tazobactam (ZOSYN) IVPB 3.375 g  Status:  Discontinued        3.375 g 12.5 mL/hr over 240 Minutes Intravenous Every 8 hours 08/06/11 1555 08/08/11 1111   08/06/11 0100   vancomycin (VANCOCIN) 1,500 mg in sodium chloride 0.9 % 500 mL IVPB        1,500 mg 250 mL/hr over 120 Minutes Intravenous Every 12 hours 08/05/11 1116     08/05/11 1200   vancomycin (VANCOCIN) 2,500 mg in sodium chloride 0.9 % 500 mL IVPB        2,500 mg 250 mL/hr over 120 Minutes Intravenous  Once 08/05/11 1116 08/05/11 1404   08/05/11 0015   vancomycin (VANCOCIN) IVPB 1000 mg/200 mL premix        1,000 mg 200 mL/hr over 60 Minutes Intravenous  Once 08/05/11 0001 08/05/11 0119          Medications: Scheduled Meds:   .  ceFAZolin (ANCEF) IV  2 g Intravenous Q8H  . enoxaparin (LOVENOX) injection  80 mg Subcutaneous Q24H  . furosemide  40 mg Oral Daily  . glipiZIDE  5 mg Oral QAC breakfast  . insulin aspart  0-15 Units Subcutaneous TID WC  . insulin aspart  0-5 Units Subcutaneous QHS  . insulin glargine  25 Units Subcutaneous QHS  . pantoprazole  40 mg Oral Q1200  . vancomycin  1,500 mg Intravenous Q12H   Continuous Infusions:  PRN Meds:.acetaminophen, acetaminophen, alum & mag hydroxide-simeth, diphenhydrAMINE, HYDROcodone-acetaminophen, HYDROmorphone, ondansetron (ZOFRAN) IV, ondansetron   Objective: Weight change:   Intake/Output Summary (Last 24 hours) at 08/09/11 2019 Last data filed at 08/09/11 1336  Gross per 24  hour  Intake   1150 ml  Output      0 ml  Net   1150 ml   Blood pressure 132/71, pulse 78, temperature 97.7 F (36.5 C), temperature source Oral, resp. rate 16, height 5\' 1"  (1.549 m), weight 344 lb 8 oz (156.264 kg), SpO2 99.00%. Temp:  [97.4 F (36.3 C)-98 F (36.7 C)] 97.7 F (36.5 C) (04/23 1432) Pulse Rate:  [78-99] 78  (04/23 1432) Resp:  [16-18] 16  (04/23 1432) BP: (114-132)/(66-81) 132/71 mmHg (04/23 1432) SpO2:  [99 %] 99 % (04/23 1432)  Physical Exam: General: Alert and awake, oriented x3, not in any acute distress.  HEENT: anicteric sclera, pupils reactive to light and accommodation, EOMI, oropharynx clear and without exudate  CVS regular rate, normal r, no murmur rubs or gallops  Chest: clear to auscultation bilaterally, no wheezing, rales or rhonchi  Abdomen: soft nontender, nondistended, normal bowel sounds,  Skin: she has two patchy areas anteriorly of erythema on leg R>>L and right with some bullous areas diminshed vs yesterday Neuro: nonfocal, strength and sensation intact   Lab Results: No results found for this basename: WBC:2,HGB:2,HCT:2,PLT:2 in the last 72 hours  BMET No results found for this  basename: NA:2,K:2,CL:2,CO2:2,GLUCOSE:2,BUN:2,CREATININE:2,CALCIUM:2 in the last 72 hours  Micro Results: No results found for this or any previous visit (from the past 240 hour(s)).  Studies/Results: No results found.    Assessment/Plan: [name] is a 41 y.o. female with DM, morbid obesity and undoubted damage to lymphatics due to recurrent cellulitis.   1) Recurrent cellulitis: . 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

## 2011-08-09 NOTE — Progress Notes (Signed)
Patient ID: [name]  female  [id number]    DOB: May 16, 1970    DOA: 08/04/2011  PCP: Herb Grays, MD, MD  Subjective:  No specific complaints except left lower extremity cellulitis appears to be improving per patient  Objective: Weight change:   Intake/Output Summary (Last 24 hours) at 08/09/11 1111 Last data filed at 08/09/11 0500  Gross per 24 hour  Intake   1150 ml  Output      0 ml  Net   1150 ml   Blood pressure 114/66, pulse 81, temperature 97.4 F (36.3 C), temperature source Oral, resp. rate 18, height 5\' 1"  (1.549 m), weight 156.264 kg (344 lb 8 oz), SpO2 99.00%.  Physical Exam: General: Alert and awake, oriented x3, not in any acute distress. HEENT: anicteric sclera, pupils reactive to light and accommodation, EOMI CVS: S1-S2 clear, no murmur rubs or gallops Chest: clear to auscultation bilaterally, no wheezing, rales or rhonchi Abdomen: soft nontender, nondistended, normal bowel sounds, no organomegaly Extremities: Chronic lymphedema with erythema significantly improved in the right lower extremity, left lower strength he also has mild erythema but with some induration, no tenderness  Neuro: Cranial nerves II-XII intact, no focal neurological deficits  Lab Results: Basic Metabolic Panel:  Lab 08/06/11 1308 08/05/11 0833 08/05/11 0006  NA 138 -- 138  K 4.2 -- 3.9  CL 102 -- 101  CO2 29 -- 27  GLUCOSE 182* -- 272*  BUN 10 -- 13  CREATININE 0.78 0.60 --  CALCIUM 8.6 -- 9.1  MG -- -- --  PHOS -- -- --    Lab 08/06/11 0700 08/05/11 0833 08/05/11 0006  WBC 7.6 8.3 --  NEUTROABS -- -- 8.1*  HGB 11.2* 11.4* --  HCT 36.6 35.9* --  MCV 81.0 79.6 --  PLT 325 323 --   CBG:  Lab 08/09/11 0822 08/08/11 2216 08/08/11 1707 08/08/11 1246 08/08/11 0832  GLUCAP 96 183* 104* 103* 140*     Micro Results: No results found for this or any previous visit (from the past 240 hour(s)).  Studies/Results: Ct Tibia Fibula Right W Contrast  08/05/2011   *RADIOLOGY REPORT*  Clinical Data: Cellulitis with blistering, query abscess.  CT OF THE RIGHT TIBIA AND FIBULA WITH CONTRAST  Contrast: OMNIPAQUE IOHEXOL 300 MG/ML  SOLN  Comparison: 06/21/2011 radiographs of the knee.  Findings: Degenerative patellofemoral and medial compartmental spurring noted in the knee.  No bony destructive findings in the tibia or fibula to suggest osteomyelitis.  Plantar and Achilles calcaneal spurs noted. Posteromedial to the knee, there is abnormal subcutaneous edema and skin thickening which could reflect cellulitis.  No drainable abscess noted in this vicinity.  In the calf, there is prominent diffuse subcutaneous edema with minimal lateral sparing.  The fluid, although confluent, does not demonstrate enhancing margins or characteristic appearance for subcutaneous abscess.  Subcutaneous edema overlies the medial malleolus and probably tracks into the foot.  The edema appears to spare of the deep fascial planes and musculature, with no findings of muscular abscess.  Regional vasculature appears patent.  IMPRESSION:  1.  Extensive subcutaneous edema in the calf appears infiltrative. No pocket of fluid with enhancing margins is observed to suggest abscess. 2.  There is relative sparing of deep fascial planes in no compelling findings of myositis.  No muscular abscess observed. 3.  Osteoarthritis of the knee. 4.  Plantar and Achilles calcaneal spurs.  Original Report Authenticated By: Dellia Cloud, M.D.    Medications: Scheduled Meds:    .  ceFAZolin (ANCEF) IV  2 g Intravenous Q8H  . enoxaparin (LOVENOX) injection  80 mg Subcutaneous Q24H  . furosemide  40 mg Oral Daily  . glipiZIDE  5 mg Oral QAC breakfast  . insulin aspart  0-15 Units Subcutaneous TID WC  . insulin aspart  0-5 Units Subcutaneous QHS  . insulin glargine  25 Units Subcutaneous QHS  . pantoprazole  40 mg Oral Q1200  . vancomycin  1,500 mg Intravenous Q12H  . DISCONTD:  ceFAZolin (ANCEF) IV  2 g  Intravenous Q8H  . DISCONTD: piperacillin-tazobactam (ZOSYN)  IV  3.375 g Intravenous Q8H   Continuous Infusions:    Assessment/Plan: Principal Problem:  *Cellulitis: Bilateral and recurrent - Highly appreciate IDs consultation by Dr. Algis Liming, was placed on IV vancomycin and Ancef  - Will continue IV vancomycin and Ancef today, if continues to improve will transitioned to by mouth Keflex and doxycycline per recommendations tomorrow. If patient continues to improve and able to tolerate by mouth antibiotics hopefully, we'll be able to discharge home in 1-2 days - Continue Lasix, elevate legs, lose weight, empiric MRSA precautions   Active Problems:  Peripheral edema with morbid obesity: Patient was strongly counseled to lose weight, diet control, exercise   Diabetes mellitus: Fairly controlled - Increase Lantus to 25 units, continue glipizide, sliding scale insulin   GERD (gastroesophageal reflux disease) - Continue PPI  DVT Prophylaxis: Lovenox  Code Status: Full code  Disposition: Not medically ready   LOS: 5 days   Karrine Kluttz M.D. [facility] 08/09/2011, 11:11 AM Pager: [phone]

## 2011-08-10 LAB — GLUCOSE, CAPILLARY
Glucose-Capillary: 128 mg/dL — ABNORMAL HIGH (ref 70–99)
Glucose-Capillary: 139 mg/dL — ABNORMAL HIGH (ref 70–99)

## 2011-08-10 MED ORDER — DOXYCYCLINE HYCLATE 100 MG PO TABS
100.0000 mg | ORAL_TABLET | Freq: Two times a day (BID) | ORAL | Status: DC
  Administered 2011-08-10: 100 mg via ORAL
  Filled 2011-08-10 (×2): qty 1

## 2011-08-10 MED ORDER — CEPHALEXIN 500 MG PO CAPS: 500.0000 mg | ORAL_CAPSULE | Freq: Three times a day (TID) | ORAL | Status: DC

## 2011-08-10 MED ORDER — FUROSEMIDE 40 MG PO TABS: 40.0000 mg | ORAL_TABLET | Freq: Every day | ORAL | Status: DC

## 2011-08-10 MED ORDER — CEPHALEXIN 500 MG PO CAPS
500.0000 mg | ORAL_CAPSULE | Freq: Three times a day (TID) | ORAL | Status: DC
  Administered 2011-08-10: 500 mg via ORAL
  Filled 2011-08-10 (×4): qty 1

## 2011-08-10 MED ORDER — PANTOPRAZOLE SODIUM 40 MG PO TBEC: 40.0000 mg | DELAYED_RELEASE_TABLET | Freq: Every day | ORAL | Status: DC

## 2011-08-10 MED ORDER — DOXYCYCLINE HYCLATE 100 MG PO TABS: 100.0000 mg | ORAL_TABLET | Freq: Two times a day (BID) | ORAL | Status: AC

## 2011-08-10 MED ORDER — HYDROCODONE-ACETAMINOPHEN 5-325 MG PO TABS: 1.0000 | ORAL_TABLET | Freq: Four times a day (QID) | ORAL | Status: AC | PRN

## 2011-08-10 MED ORDER — CEPHALEXIN 500 MG PO CAPS: 500.0000 mg | ORAL_CAPSULE | Freq: Four times a day (QID) | ORAL | Status: AC

## 2011-08-10 MED ORDER — PROMETHAZINE HCL 12.5 MG PO TABS: 12.5000 mg | ORAL_TABLET | Freq: Four times a day (QID) | ORAL | Status: DC | PRN

## 2011-08-10 NOTE — Discharge Summary (Signed)
Physician Discharge Summary  Patient ID: [name] MRN: [id number] DOB/AGE: 05/19/1970 41 y.o.  Admit date: 08/04/2011 Discharge date: 08/10/2011  Primary Care Physician:  Herb Grays, MD, MD  Discharge Diagnoses:    . recurrent Cellulitis .Peripheral edema/lymphedema  .Diabetes mellitus .GERD (gastroesophageal reflux disease) .Obesity  Consults: Infectious disease, Dr. Algis Liming  Discharge Medications: Medication List  As of 08/10/2011 10:55 AM   TAKE these medications         cephALEXin 500 MG capsule   Commonly known as: KEFLEX   Take 1 capsule (500 mg total) by mouth 4 (four) times daily. X 14 days      doxycycline 100 MG tablet   Commonly known as: VIBRA-TABS   Take 1 tablet (100 mg total) by mouth every 12 (twelve) hours. X 14 days      furosemide 40 MG tablet   Commonly known as: LASIX   Take 1 tablet (40 mg total) by mouth daily.      glipiZIDE 5 MG tablet   Commonly known as: GLUCOTROL   Take 1 tablet (5 mg total) by mouth daily before breakfast.      HYDROcodone-acetaminophen 5-325 MG per tablet   Commonly known as: NORCO   Take 1 tablet by mouth every 6 (six) hours as needed for pain.      metFORMIN 500 MG tablet   Commonly known as: GLUCOPHAGE   Take 1 tablet (500 mg total) by mouth 2 (two) times daily with a meal.      neomycin-bacitracin-polymyxin Oint   Commonly known as: NEOSPORIN   Apply 1 application topically daily as needed. Patient used this medication for her foot.      omeprazole 20 MG tablet   Commonly known as: PRILOSEC OTC   Take 20 mg by mouth daily. OTC Prilosec      promethazine 12.5 MG tablet   Commonly known as: PHENERGAN   Take 1 tablet (12.5 mg total) by mouth every 6 (six) hours as needed for nausea.             Brief H and P: For complete details please refer to admission H and P, but in brief Patient is a 41 year old Caucasian female with morbid obesity, diabetes mellitus, GERD presented to ED with cellulitis of  the lower legs. History was obtained from the patient who stated that she noticed erythema and increased swelling in her right lower leg on Tuesday 4 days ago. She also had some swelling and erythema of left lower extremity, which appears to be resolving after starting antibiotics in the emergency room. She states that presentation is similar to her previous episodes or cellulitis. She denies any trauma to the legs. She also endorsed having some nausea with low-grade fevers and chills. She denies any abdominal pain, diarrhea, constipation any metaplasia melena. She also noticed some blistering on the right lower leg   Hospital Course:  Cellulitis: Bilateral and recurrent, likely exacerbated by her lymphedema. Patient was initially placed on vancomycin and Zosyn. She had a slow and progressive improvement. Doppler ultrasounds were negative for any DVT. CT scan of the right lower activity was obtained which showed extensive subcutaneous edema appeared infiltrative but no abscess or myositis. ID consultation was obtained, patient was seen by Dr. Algis Liming. Patient was placed on IV vancomycin and Ancef for staph coverage. Patient was transitioned to by mouth Keflex and doxycycline and will continue for 2 weeks after the discharge. Patient was placed on Lasix and was recommended to elevate legs  while in bed/chair and lose weight    Day of Discharge BP 142/68  Pulse 96  Temp(Src) 98.3 F (36.8 C) (Oral)  Resp 18  Ht 5\' 1"  (1.549 m)  Wt 156.264 kg (344 lb 8 oz)  BMI 65.09 kg/m2  SpO2 97%  Physical Exam: General: Alert and awake oriented x3 not in any acute distress. HEENT: anicteric sclera, pupils reactive to light and accommodation CVS: S1-S2 clear no murmur rubs or gallops Chest: clear to auscultation bilaterally, no wheezing rales or rhonchi Abdomen: soft nontender, nondistended, normal bowel sounds, no organomegaly Extremities: no cyanosis, clubbing or edema noted bilaterally Neuro: Cranial  nerves II-XII intact, no focal neurological deficits   The results of significant diagnostics from this hospitalization (including imaging, microbiology, ancillary and laboratory) are listed below for reference.    LAB RESULTS: Basic Metabolic Panel:  Lab 08/06/11 1610 08/05/11 0833 08/05/11 0006  NA 138 -- 138  K 4.2 -- 3.9  CL 102 -- 101  CO2 29 -- 27  GLUCOSE 182* -- 272*  BUN 10 -- 13  CREATININE 0.78 0.60 --  CALCIUM 8.6 -- 9.1  MG -- -- --  PHOS -- -- --  CBC:  Lab 08/06/11 0700 08/05/11 0833 08/05/11 0006  WBC 7.6 8.3 --  NEUTROABS -- -- 8.1*  HGB 11.2* 11.4* --  HCT 36.6 35.9* --  MCV 81.0 -- --  PLT 325 323 --  CBG:  Lab 08/10/11 0818 08/09/11 2154  GLUCAP 128* 144*    Significant Diagnostic Studies:  Ct Tibia Fibula Right W Contrast  08/05/2011  *RADIOLOGY REPORT*  Clinical Data: Cellulitis with blistering, query abscess.  CT OF THE RIGHT TIBIA AND FIBULA WITH CONTRAST  Contrast: OMNIPAQUE IOHEXOL 300 MG/ML  SOLN  Comparison: 06/21/2011 radiographs of the knee.  Findings: Degenerative patellofemoral and medial compartmental spurring noted in the knee.  No bony destructive findings in the tibia or fibula to suggest osteomyelitis.  Plantar and Achilles calcaneal spurs noted. Posteromedial to the knee, there is abnormal subcutaneous edema and skin thickening which could reflect cellulitis.  No drainable abscess noted in this vicinity.  In the calf, there is prominent diffuse subcutaneous edema with minimal lateral sparing.  The fluid, although confluent, does not demonstrate enhancing margins or characteristic appearance for subcutaneous abscess.  Subcutaneous edema overlies the medial malleolus and probably tracks into the foot.  The edema appears to spare of the deep fascial planes and musculature, with no findings of muscular abscess.  Regional vasculature appears patent.  IMPRESSION:  1.  Extensive subcutaneous edema in the calf appears infiltrative. No pocket of  fluid with enhancing margins is observed to suggest abscess. 2.  There is relative sparing of deep fascial planes in no compelling findings of myositis.  No muscular abscess observed. 3.  Osteoarthritis of the knee. 4.  Plantar and Achilles calcaneal spurs.  Original Report Authenticated By: Dellia Cloud, M.D.     Disposition and Follow-up: Discharge Orders    Future Orders Please Complete By Expires   Diet Carb Modified      Increase activity slowly      Discharge instructions      Comments:   Please keep legs elevated while in bed or chair       DISPOSITION: Home  DIET: Carb modified   ACTIVITY: As tolerated   DISCHARGE FOLLOW-UP Follow-up Information    Follow up with Herb Grays, MD. Schedule an appointment as soon as possible for a visit in 10 days. (for  hospital follow-up)    Contact information:   [address] [place] [address] [phone]          Time spent on Discharge: 45 minutes  Signed:  Scott Vanderveer M.D. [facility] 08/10/2011, 10:55 AM

## 2011-08-10 NOTE — Progress Notes (Signed)
Patient discharged to home as ordered, prescriptions and instructions given to pt. And significant others, and verbalized understanding

## 2011-09-02 ENCOUNTER — Telehealth: Payer: Self-pay | Admitting: *Deleted

## 2011-09-02 ENCOUNTER — Inpatient Hospital Stay: Payer: BC Managed Care – PPO | Admitting: Internal Medicine

## 2011-09-02 NOTE — Telephone Encounter (Signed)
Patient no showed hospital follow up today.  Called and left message on her cell phone to call the clinic to have this appt rescheduled. Wendall Mola CMA

## 2011-09-23 ENCOUNTER — Emergency Department (HOSPITAL_BASED_OUTPATIENT_CLINIC_OR_DEPARTMENT_OTHER)
Admission: EM | Admit: 2011-09-23 | Discharge: 2011-09-24 | Disposition: A | Discharge status: Home / Self Care | Payer: BC Managed Care – PPO | Attending: Emergency Medicine | Admitting: Emergency Medicine

## 2011-09-23 ENCOUNTER — Emergency Department (HOSPITAL_BASED_OUTPATIENT_CLINIC_OR_DEPARTMENT_OTHER): Payer: BC Managed Care – PPO

## 2011-09-23 ENCOUNTER — Encounter (HOSPITAL_BASED_OUTPATIENT_CLINIC_OR_DEPARTMENT_OTHER): Payer: Self-pay | Admitting: *Deleted

## 2011-09-23 DIAGNOSIS — L0390 Cellulitis, unspecified: Secondary | ICD-10-CM

## 2011-09-23 DIAGNOSIS — R21 Rash and other nonspecific skin eruption: Secondary | ICD-10-CM | POA: Insufficient documentation

## 2011-09-23 DIAGNOSIS — K219 Gastro-esophageal reflux disease without esophagitis: Secondary | ICD-10-CM | POA: Insufficient documentation

## 2011-09-23 DIAGNOSIS — L02419 Cutaneous abscess of limb, unspecified: Secondary | ICD-10-CM | POA: Insufficient documentation

## 2011-09-23 DIAGNOSIS — E119 Type 2 diabetes mellitus without complications: Secondary | ICD-10-CM | POA: Insufficient documentation

## 2011-09-23 DIAGNOSIS — M79609 Pain in unspecified limb: Secondary | ICD-10-CM | POA: Insufficient documentation

## 2011-09-23 DIAGNOSIS — I890 Lymphedema, not elsewhere classified: Secondary | ICD-10-CM | POA: Insufficient documentation

## 2011-09-23 DIAGNOSIS — R51 Headache: Secondary | ICD-10-CM | POA: Insufficient documentation

## 2011-09-23 DIAGNOSIS — R110 Nausea: Secondary | ICD-10-CM | POA: Insufficient documentation

## 2011-09-23 DIAGNOSIS — L03119 Cellulitis of unspecified part of limb: Secondary | ICD-10-CM | POA: Insufficient documentation

## 2011-09-23 LAB — DIFFERENTIAL
Basophils Absolute: 0 10*3/uL (ref 0.0–0.1)
Basophils Relative: 0 % (ref 0–1)
Eosinophils Absolute: 0.3 10*3/uL (ref 0.0–0.7)
Eosinophils Relative: 3 % (ref 0–5)
Lymphocytes Relative: 21 % (ref 12–46)
Lymphs Abs: 2.2 10*3/uL (ref 0.7–4.0)
Monocytes Absolute: 0.8 10*3/uL (ref 0.1–1.0)
Monocytes Relative: 7 % (ref 3–12)
Neutro Abs: 7.3 10*3/uL (ref 1.7–7.7)
Neutrophils Relative %: 69 % (ref 43–77)

## 2011-09-23 LAB — CBC
HCT: 39.2 % (ref 36.0–46.0)
Hemoglobin: 12.9 g/dL (ref 12.0–15.0)
MCH: 25.7 pg — ABNORMAL LOW (ref 26.0–34.0)
MCHC: 32.9 g/dL (ref 30.0–36.0)
MCV: 78.2 fL (ref 78.0–100.0)
Platelets: 384 10*3/uL (ref 150–400)
RBC: 5.01 MIL/uL (ref 3.87–5.11)
RDW: 15.5 % (ref 11.5–15.5)
WBC: 10.5 10*3/uL (ref 4.0–10.5)

## 2011-09-23 LAB — BASIC METABOLIC PANEL
BUN: 9 mg/dL (ref 6–23)
CO2: 24 mEq/L (ref 19–32)
Calcium: 9.2 mg/dL (ref 8.4–10.5)
Chloride: 101 mEq/L (ref 96–112)
Creatinine, Ser: 0.6 mg/dL (ref 0.50–1.10)
GFR calc Af Amer: >90 mL/min (ref >90)
GFR calc non Af Amer: >90 mL/min (ref >90)
Glucose, Bld: 247 mg/dL — ABNORMAL HIGH (ref 70–99)
Potassium: 3.8 mEq/L (ref 3.5–5.1)
Sodium: 136 mEq/L (ref 135–145)

## 2011-09-23 MED ORDER — ACETAMINOPHEN 325 MG PO TABS
650.0000 mg | ORAL_TABLET | Freq: Once | ORAL | Status: AC
  Administered 2011-09-23: 650 mg via ORAL
  Filled 2011-09-23: qty 2

## 2011-09-23 MED ORDER — ONDANSETRON 4 MG PO TBDP
4.0000 mg | ORAL_TABLET | Freq: Once | ORAL | Status: AC
  Administered 2011-09-23: 4 mg via ORAL
  Filled 2011-09-23: qty 1

## 2011-09-23 NOTE — ED Notes (Signed)
Painful blistery rash on her right lower leg x 3 days.

## 2011-09-23 NOTE — ED Notes (Signed)
Patient also c/o nausea without vomiting and headache that she has had since yesterday.  Pain is "everwhere" on her head, describes the pain as pressure.  Has not gone away since it started.

## 2011-09-23 NOTE — ED Provider Notes (Signed)
History     CSN: [id number]  Arrival date & time 09/23/11  2020   First MD Initiated Contact with Patient 09/23/11 2049      Chief Complaint  Patient presents with  . Rash    (Consider location/radiation/quality/duration/timing/severity/associated sxs/prior treatment) HPI History from patient. 41 year old female who presents with a painful rash to her right lower extremity for the past 2-3 days. She has a history of chronic lymphedema and has had cellulitis to the extremity in the past, which she has previously had to be hospitalized for to receive IV antibiotics. States that the area seems more red than usual. Pain is described as throbbing and worsens with weightbearing or palpation of the extremity. Pain does not radiate. Has chronic edema which is not significantly worse than usual. She denies any numbness. She denies fever or chills, but states that she has felt nauseated and has had a headache today. Denies any cough, chest pain, shortness of breath.  Patient has no prior history of DVT/PE. Denies recent trauma, surgery, or prolonged immobilization. Denies hemoptysis or use of exogenous estrogen.  She takes Lasix 40 mg daily for her edema which she has been taking as prescribed.  Past Medical History  Diagnosis Date  . Diabetes mellitus   . GERD (gastroesophageal reflux disease)   . Cellulitis 07/2011    Past Surgical History  Procedure Date  . Cholecystectomy   . Knee arthroscopy   . Laparoscopic tubal ligation 04/29/2011    Procedure: LAPAROSCOPIC TUBAL LIGATION;  Surgeon: Turner Daniels, MD;  Location: [facility];  Service: Gynecology;  Laterality: Bilateral;  with Filshie Clips    No family history on file.  History  Substance Use Topics  . Smoking status: Former Smoker -- 0.5 packs/day    Types: Cigarettes    Quit date: 07/22/2011  . Smokeless tobacco: Not on file  . Alcohol Use: Yes     occasional    OB History    Grav Para Term Preterm Abortions TAB SAB Ect Mult  Living                  Review of Systems  Constitutional: Negative for fever, chills, activity change and appetite change.  Respiratory: Negative for cough, chest tightness and shortness of breath.   Cardiovascular: Positive for leg swelling. Negative for chest pain and palpitations.  Musculoskeletal: Negative for myalgias.  Skin: Positive for color change and rash. Negative for wound.  Neurological: Negative for dizziness and weakness.    Allergies  Adhesive  Home Medications   Current Outpatient Rx  Name Route Sig Dispense Refill  . FUROSEMIDE 40 MG PO TABS Oral Take 1 tablet (40 mg total) by mouth daily. 30 tablet 3  . OMEPRAZOLE MAGNESIUM 20 MG PO TBEC Oral Take 20 mg by mouth daily. OTC Prilosec    . PROMETHAZINE HCL 12.5 MG PO TABS Oral Take 1 tablet (12.5 mg total) by mouth every 6 (six) hours as needed for nausea. 30 tablet 0    BP 134/86  Pulse 116  Temp(Src) 98.4 F (36.9 C) (Oral)  Resp 20  SpO2 98%  Physical Exam  Nursing note and vitals reviewed. Constitutional: She appears well-developed and well-nourished. No distress.       Morbidly obese  HENT:  Head: Normocephalic and atraumatic.  Neck: Normal range of motion.  Cardiovascular: Normal rate, regular rhythm and normal heart sounds.   Pulmonary/Chest: Effort normal and breath sounds normal. She exhibits no tenderness.  Abdominal: Soft. Bowel sounds  are normal. There is no tenderness.  Musculoskeletal: Normal range of motion.       4+ weeping edema to the right lower extremity and 3+ edema to the left lower extremity. Right lower extremity tender to palpation especially over the posterior calf. No palpable cord. Mild erythema and blistering to the same. Neurovascularly intact distally with sensory intact to light touch. DP/PT pulses 2+.  Neurological: She is alert.  Skin: Skin is warm and dry. She is not diaphoretic.  Psychiatric: She has a normal mood and affect.    ED Course  Procedures (including  critical care time)  Labs Reviewed  CBC - Abnormal; Notable for the following:    MCH 25.7 (*)    All other components within normal limits  BASIC METABOLIC PANEL - Abnormal; Notable for the following:    Glucose, Bld 247 (*)    All other components within normal limits  DIFFERENTIAL   No results found.   No diagnosis found.    MDM  Pt with chronic lymphedema to LEs with slightly increased erythema to RLE. Pt with nl CBC/BMP (with exception of hyperglycemia). Awaiting doppler RLE to r/o DVT. Plan, provided this is nl, to treat as cellulitis. Will cover with keflex, bactrim. Signout to Dr. Nicanor Alcon at shift change, pending doppler.  Case d/w Dr. Nicanor Alcon.     Grant Fontana, [place] 09/24/11 [phone]

## 2011-09-24 MED ORDER — IBUPROFEN 800 MG PO TABS
800.0000 mg | ORAL_TABLET | Freq: Once | ORAL | Status: AC
  Administered 2011-09-24: 800 mg via ORAL
  Filled 2011-09-24: qty 1

## 2011-09-24 MED ORDER — SULFAMETHOXAZOLE-TRIMETHOPRIM 800-160 MG PO TABS: 1.0000 | ORAL_TABLET | Freq: Two times a day (BID) | ORAL | Status: AC

## 2011-09-24 MED ORDER — CEPHALEXIN 500 MG PO CAPS: 500.0000 mg | ORAL_CAPSULE | Freq: Four times a day (QID) | ORAL | Status: AC

## 2011-09-24 NOTE — Discharge Instructions (Signed)
You have been given prescriptions for Keflex and Bactrim, antibiotics, to treat your cellulitis (skin infection). Please take ALL of these as prescribed. Follow up with your primary care doctor early next week for a recheck. Return sooner if you have increased pain/swelling, start running a fever, or with any other worrisome symptoms.  Cellulitis Cellulitis is an infection of the skin and the tissue beneath it. The area is typically red and tender. It is caused by germs (bacteria) (usually staph or strep) that enter the body through cuts or sores. Cellulitis most commonly occurs in the arms or lower legs.  HOME CARE INSTRUCTIONS   If you are given a prescription for medications which kill germs (antibiotics), take as directed until finished.   If the infection is on the arm or leg, keep the limb elevated as able.   Use a warm cloth several times per day to relieve pain and encourage healing.   See your caregiver for recheck of the infected site as directed if problems arise.   Only take over-the-counter or prescription medicines for pain, discomfort, or fever as directed by your caregiver.  SEEK MEDICAL CARE IF:   The area of redness (inflammation) is spreading, there are red streaks coming from the infected site, or if a part of the infection begins to turn dark in color.   The joint or bone underneath the infected skin becomes painful after the skin has healed.   The infection returns in the same or another area after it seems to have gone away.   A boil or bump swells up. This may be an abscess.   New, unexplained problems such as pain or fever develop.  SEEK IMMEDIATE MEDICAL CARE IF:   You have a fever.   You or your child feels drowsy or lethargic.   There is vomiting, diarrhea, or lasting discomfort or feeling ill (malaise) with muscle aches and pains.  MAKE SURE YOU:   Understand these instructions.   Will watch your condition.   Will get help right away if you are not  doing well or get worse.  Document Released: 01/12/2005 Document Revised: 03/24/2011 Document Reviewed: 11/21/2007 [facility] Patient Information 2012 [place], [place].

## 2011-09-25 NOTE — ED Provider Notes (Signed)
Skin: skin changes of chronic stasis with mild erythema over the pretibial area  Medical screening examination/treatment/procedure(s) were conducted as a shared visit with non-physician practitioner(s) and myself.  I personally evaluated the patient during the encounter   Cyndra Numbers, MD 09/27/11 1200

## 2011-10-20 ENCOUNTER — Encounter (HOSPITAL_BASED_OUTPATIENT_CLINIC_OR_DEPARTMENT_OTHER): Payer: Self-pay | Admitting: *Deleted

## 2011-10-20 ENCOUNTER — Emergency Department (HOSPITAL_BASED_OUTPATIENT_CLINIC_OR_DEPARTMENT_OTHER)
Admission: EM | Admit: 2011-10-20 | Discharge: 2011-10-21 | Disposition: A | Discharge status: Skilled Nursing Facility | Payer: BC Managed Care – PPO | Attending: Emergency Medicine | Admitting: Emergency Medicine

## 2011-10-20 ENCOUNTER — Emergency Department (HOSPITAL_BASED_OUTPATIENT_CLINIC_OR_DEPARTMENT_OTHER): Payer: BC Managed Care – PPO

## 2011-10-20 DIAGNOSIS — Z79899 Other long term (current) drug therapy: Secondary | ICD-10-CM | POA: Insufficient documentation

## 2011-10-20 DIAGNOSIS — R109 Unspecified abdominal pain: Secondary | ICD-10-CM

## 2011-10-20 DIAGNOSIS — R110 Nausea: Secondary | ICD-10-CM | POA: Insufficient documentation

## 2011-10-20 DIAGNOSIS — Z87891 Personal history of nicotine dependence: Secondary | ICD-10-CM | POA: Insufficient documentation

## 2011-10-20 DIAGNOSIS — M62838 Other muscle spasm: Secondary | ICD-10-CM | POA: Insufficient documentation

## 2011-10-20 DIAGNOSIS — E119 Type 2 diabetes mellitus without complications: Secondary | ICD-10-CM | POA: Insufficient documentation

## 2011-10-20 DIAGNOSIS — R1031 Right lower quadrant pain: Secondary | ICD-10-CM | POA: Insufficient documentation

## 2011-10-20 DIAGNOSIS — K219 Gastro-esophageal reflux disease without esophagitis: Secondary | ICD-10-CM | POA: Insufficient documentation

## 2011-10-20 LAB — URINALYSIS, ROUTINE W REFLEX MICROSCOPIC
Bilirubin Urine: NEGATIVE
Glucose, UA: >1000 mg/dL — AB
Hgb urine dipstick: NEGATIVE
Ketones, ur: NEGATIVE mg/dL
Nitrite: NEGATIVE
Protein, ur: NEGATIVE mg/dL
Specific Gravity, Urine: 1.035 — ABNORMAL HIGH (ref 1.005–1.030)
Urobilinogen, UA: 0.2 mg/dL (ref 0.0–1.0)
pH: 5.5 (ref 5.0–8.0)

## 2011-10-20 LAB — CBC WITH DIFFERENTIAL/PLATELET
Basophils Absolute: 0 10*3/uL (ref 0.0–0.1)
Basophils Relative: 0 % (ref 0–1)
Eosinophils Absolute: 0.4 10*3/uL (ref 0.0–0.7)
Eosinophils Relative: 4 % (ref 0–5)
HCT: 37 % (ref 36.0–46.0)
Hemoglobin: 12.4 g/dL (ref 12.0–15.0)
Lymphocytes Relative: 26 % (ref 12–46)
Lymphs Abs: 2.8 10*3/uL (ref 0.7–4.0)
MCH: 26.2 pg (ref 26.0–34.0)
MCHC: 33.5 g/dL (ref 30.0–36.0)
MCV: 78.1 fL (ref 78.0–100.0)
Monocytes Absolute: 0.8 10*3/uL (ref 0.1–1.0)
Monocytes Relative: 7 % (ref 3–12)
Neutro Abs: 6.5 10*3/uL (ref 1.7–7.7)
Neutrophils Relative %: 62 % (ref 43–77)
Platelets: 385 10*3/uL (ref 150–400)
RBC: 4.74 MIL/uL (ref 3.87–5.11)
RDW: 15 % (ref 11.5–15.5)
WBC: 10.4 10*3/uL (ref 4.0–10.5)

## 2011-10-20 LAB — HEPATIC FUNCTION PANEL
ALT: 16 U/L (ref 0–35)
AST: 15 U/L (ref 0–37)
Albumin: 3.1 g/dL — ABNORMAL LOW (ref 3.5–5.2)
Alkaline Phosphatase: 113 U/L (ref 39–117)
Bilirubin, Direct: <0.1 mg/dL (ref 0.0–0.3)
Total Bilirubin: 0.2 mg/dL — ABNORMAL LOW (ref 0.3–1.2)
Total Protein: 7.2 g/dL (ref 6.0–8.3)

## 2011-10-20 LAB — BASIC METABOLIC PANEL
BUN: 10 mg/dL (ref 6–23)
CO2: 25 mEq/L (ref 19–32)
Calcium: 9.3 mg/dL (ref 8.4–10.5)
Chloride: 103 mEq/L (ref 96–112)
Creatinine, Ser: 0.8 mg/dL (ref 0.50–1.10)
GFR calc Af Amer: >90 mL/min (ref >90)
GFR calc non Af Amer: 89 mL/min — ABNORMAL LOW (ref >90)
Glucose, Bld: 221 mg/dL — ABNORMAL HIGH (ref 70–99)
Potassium: 3.9 mEq/L (ref 3.5–5.1)
Sodium: 138 mEq/L (ref 135–145)

## 2011-10-20 LAB — URINE MICROSCOPIC-ADD ON

## 2011-10-20 MED ORDER — DIAZEPAM 5 MG/ML IJ SOLN
5.0000 mg | Freq: Once | INTRAMUSCULAR | Status: AC
  Administered 2011-10-20: 10 mg via INTRAVENOUS
  Filled 2011-10-20: qty 2

## 2011-10-20 MED ORDER — CYCLOBENZAPRINE HCL 10 MG PO TABS: 10.0000 mg | ORAL_TABLET | Freq: Two times a day (BID) | ORAL | Status: AC | PRN

## 2011-10-20 MED ORDER — ONDANSETRON HCL 4 MG/2ML IJ SOLN
4.0000 mg | Freq: Once | INTRAMUSCULAR | Status: AC
  Administered 2011-10-20: 4 mg via INTRAVENOUS
  Filled 2011-10-20: qty 2

## 2011-10-20 MED ORDER — IOHEXOL 300 MG/ML  SOLN
20.0000 mL | Freq: Once | INTRAMUSCULAR | Status: AC | PRN
  Administered 2011-10-20: 20 mL via ORAL

## 2011-10-20 MED ORDER — IOHEXOL 300 MG/ML  SOLN
100.0000 mL | Freq: Once | INTRAMUSCULAR | Status: AC | PRN
  Administered 2011-10-20: 100 mL via INTRAVENOUS

## 2011-10-20 MED ORDER — IBUPROFEN 600 MG PO TABS: 600.0000 mg | ORAL_TABLET | Freq: Four times a day (QID) | ORAL | Status: AC | PRN

## 2011-10-20 MED ORDER — SODIUM CHLORIDE 0.9 % IV BOLUS (SEPSIS)
1000.0000 mL | Freq: Once | INTRAVENOUS | Status: AC
  Administered 2011-10-20: 1000 mL via INTRAVENOUS

## 2011-10-20 MED ORDER — MORPHINE SULFATE 4 MG/ML IJ SOLN
4.0000 mg | Freq: Once | INTRAMUSCULAR | Status: AC
  Administered 2011-10-20: 4 mg via INTRAVENOUS
  Filled 2011-10-20: qty 1

## 2011-10-20 NOTE — ED Provider Notes (Signed)
History     CSN: [id number]  Arrival date & time 10/20/11  2011   First MD Initiated Contact with Patient 10/20/11 2023      Chief Complaint  Patient presents with  . Abdominal Pain    (Consider location/radiation/quality/duration/timing/severity/associated sxs/prior treatment) HPI  H/o diet controlled borderline DM C/o RLQ pain since today 1500, describes as twisting and "jerking". Constant, currently 9/10. Did not take anything for pain prior to arrival. +Nausea without vomiting. Denies constipation. Loose stool. States she's run a fever every night x one week. Denies vaginal discharge. Denies hematuria/dysuria/freq/urgency. She does have foul smelling odor. Dec appetite.   H/o cholecystectomy and BTL   Past Medical History  Diagnosis Date  . Diabetes mellitus   . GERD (gastroesophageal reflux disease)   . Cellulitis 07/2011    Past Surgical History  Procedure Date  . Cholecystectomy   . Knee arthroscopy   . Laparoscopic tubal ligation 04/29/2011    Procedure: LAPAROSCOPIC TUBAL LIGATION;  Surgeon: Turner Daniels, MD;  Location: [facility];  Service: Gynecology;  Laterality: Bilateral;  with Filshie Clips    History reviewed. No pertinent family history.  History  Substance Use Topics  . Smoking status: Former Smoker -- 0.5 packs/day    Types: Cigarettes    Quit date: 07/22/2011  . Smokeless tobacco: Not on file  . Alcohol Use: Yes     occasional    OB History    Grav Para Term Preterm Abortions TAB SAB Ect Mult Living                  Review of Systems  All other systems reviewed and are negative.  except as noted HPI  Allergies  Adhesive  Home Medications   Current Outpatient Rx  Name Route Sig Dispense Refill  . FUROSEMIDE 40 MG PO TABS Oral Take 1 tablet (40 mg total) by mouth daily. 30 tablet 3  . OMEPRAZOLE MAGNESIUM 20 MG PO TBEC Oral Take 20 mg by mouth daily. OTC Prilosec    . CYCLOBENZAPRINE HCL 10 MG PO TABS Oral Take 1 tablet (10 mg total) by  mouth 2 (two) times daily as needed for muscle spasms. 20 tablet 0  . IBUPROFEN 600 MG PO TABS Oral Take 1 tablet (600 mg total) by mouth every 6 (six) hours as needed for pain. 30 tablet 0    BP 146/83  Pulse 101  Temp 97.8 F (36.6 C) (Oral)  Resp 22  SpO2 100%  Physical Exam  Nursing note and vitals reviewed. Constitutional: She is oriented to person, place, and time. She appears well-developed.  HENT:  Head: Atraumatic.  Mouth/Throat: Oropharynx is clear and moist.  Eyes: Conjunctivae and EOM are normal. Pupils are equal, round, and reactive to light.  Neck: Normal range of motion. Neck supple.  Cardiovascular: Normal rate, regular rhythm, normal heart sounds and intact distal pulses.   Pulmonary/Chest: Effort normal and breath sounds normal. No respiratory distress. She has no wheezes. She has no rales.  Abdominal: Soft. She exhibits no distension. There is tenderness. There is no rebound and no guarding.       RLQ ttp +rovsing  Musculoskeletal: Normal range of motion.  Neurological: She is alert and oriented to person, place, and time.  Skin: Skin is warm and dry. No rash noted.  Psychiatric: She has a normal mood and affect.    ED Course  Procedures (including critical care time)  Labs Reviewed  URINALYSIS, ROUTINE W REFLEX MICROSCOPIC - Abnormal;  Notable for the following:    APPearance CLOUDY (*)     Specific Gravity, Urine 1.035 (*)     Glucose, UA >1000 (*)     Leukocytes, UA MODERATE (*)     All other components within normal limits  BASIC METABOLIC PANEL - Abnormal; Notable for the following:    Glucose, Bld 221 (*)     GFR calc non Af Amer 89 (*)     All other components within normal limits  URINE MICROSCOPIC-ADD ON - Abnormal; Notable for the following:    Squamous Epithelial / LPF FEW (*)     All other components within normal limits  HEPATIC FUNCTION PANEL - Abnormal; Notable for the following:    Albumin 3.1 (*)     Total Bilirubin 0.2 (*)     All  other components within normal limits  CBC WITH DIFFERENTIAL   Ct Abdomen Pelvis W Contrast  10/20/2011  *RADIOLOGY REPORT*  Clinical Data: Right lower quadrant abdominal pain  CT ABDOMEN AND PELVIS WITH CONTRAST  Technique:  Multidetector CT imaging of the abdomen and pelvis was performed following the standard protocol during bolus administration of intravenous contrast.  Contrast: OMNIPAQUE IOHEXOL 300 MG/ML  SOLN, 20mL OMNIPAQUE IOHEXOL 300 MG/ML  SOLN  Comparison: 06/03/2010, 06/30/2010  Findings: Limited images through the lung bases demonstrate no significant appreciable abnormality. The heart size is within normal limits. No pleural or pericardial effusion.  Low attenuation of the liver is in keeping with fatty infiltration. No focal abnormality.  Absent gallbladder.  No biliary duct dilatation.  Unremarkable spleen, pancreas, adrenal glands.  Symmetric renal enhancement.  No hydronephrosis or hydroureter. Small left parapelvic cysts.  No bowel obstruction.  No CT evidence for colitis.  Normal appendix.  No free intraperitoneal air or fluid.  No lymphadenopathy.  There is a lymph node along the right external iliac chain with a fatty hilum and is unchanged.  Decompressed bladder.  Bilateral tubal ligation clips.  Otherwise, unremarkable CT appearance to the uterus and adnexa.  Normal caliber vasculature.  No acute osseous finding.  IMPRESSION: No acute abnormality identified by CT.  Normal appendix.  Hepatic steatosis.  Original Report Authenticated By: Waneta Martins, M.D.    1. Abdominal pain   2. Muscle spasm     MDM  P/W Abdominal pain, RLQ. States during ED stay "See, when you put your hand here you can feel a fluttering". CT abdomen pelvis without acute appendicitis. There is no acute intra-abdominal etiology for her pain. This is likely a component of muscle spasm. I cannot feel a fluttering due to her body habitus. Plan for discharge home with Flexeril, ibuprofen. Primary care  followup as needed. No EMC precluding discharge at this time. Given Precautions for return. PMD f/u.        Forbes Cellar, MD 10/20/11 [phone]

## 2011-10-20 NOTE — ED Notes (Signed)
Pt with RLQ pain and nausea since this PM around 1500

## 2011-11-12 ENCOUNTER — Emergency Department (HOSPITAL_BASED_OUTPATIENT_CLINIC_OR_DEPARTMENT_OTHER)
Admission: EM | Admit: 2011-11-12 | Discharge: 2011-11-12 | Disposition: A | Discharge status: Home / Self Care | Payer: BC Managed Care – PPO | Attending: Emergency Medicine | Admitting: Emergency Medicine

## 2011-11-12 ENCOUNTER — Encounter (HOSPITAL_BASED_OUTPATIENT_CLINIC_OR_DEPARTMENT_OTHER): Payer: Self-pay | Admitting: *Deleted

## 2011-11-12 DIAGNOSIS — N390 Urinary tract infection, site not specified: Secondary | ICD-10-CM

## 2011-11-12 DIAGNOSIS — Z87891 Personal history of nicotine dependence: Secondary | ICD-10-CM | POA: Insufficient documentation

## 2011-11-12 DIAGNOSIS — K219 Gastro-esophageal reflux disease without esophagitis: Secondary | ICD-10-CM | POA: Insufficient documentation

## 2011-11-12 DIAGNOSIS — E119 Type 2 diabetes mellitus without complications: Secondary | ICD-10-CM | POA: Insufficient documentation

## 2011-11-12 DIAGNOSIS — R109 Unspecified abdominal pain: Secondary | ICD-10-CM | POA: Insufficient documentation

## 2011-11-12 LAB — CBC WITH DIFFERENTIAL/PLATELET
Basophils Absolute: 0 10*3/uL (ref 0.0–0.1)
Basophils Relative: 0 % (ref 0–1)
Eosinophils Absolute: 0.4 10*3/uL (ref 0.0–0.7)
Eosinophils Relative: 3 % (ref 0–5)
HCT: 37.1 % (ref 36.0–46.0)
Hemoglobin: 12.3 g/dL (ref 12.0–15.0)
Lymphocytes Relative: 23 % (ref 12–46)
Lymphs Abs: 2.7 10*3/uL (ref 0.7–4.0)
MCH: 26.1 pg (ref 26.0–34.0)
MCHC: 33.2 g/dL (ref 30.0–36.0)
MCV: 78.6 fL (ref 78.0–100.0)
Monocytes Absolute: 0.9 10*3/uL (ref 0.1–1.0)
Monocytes Relative: 8 % (ref 3–12)
Neutro Abs: 7.9 10*3/uL — ABNORMAL HIGH (ref 1.7–7.7)
Neutrophils Relative %: 67 % (ref 43–77)
Platelets: 342 10*3/uL (ref 150–400)
RBC: 4.72 MIL/uL (ref 3.87–5.11)
RDW: 15.4 % (ref 11.5–15.5)
WBC: 11.8 10*3/uL — ABNORMAL HIGH (ref 4.0–10.5)

## 2011-11-12 LAB — URINALYSIS, ROUTINE W REFLEX MICROSCOPIC
Bilirubin Urine: NEGATIVE
Glucose, UA: NEGATIVE mg/dL
Hgb urine dipstick: NEGATIVE
Ketones, ur: NEGATIVE mg/dL
Nitrite: NEGATIVE
Protein, ur: NEGATIVE mg/dL
Specific Gravity, Urine: 1.04 — ABNORMAL HIGH (ref 1.005–1.030)
Urobilinogen, UA: 0.2 mg/dL (ref 0.0–1.0)
pH: 5.5 (ref 5.0–8.0)

## 2011-11-12 LAB — URINE MICROSCOPIC-ADD ON

## 2011-11-12 LAB — COMPREHENSIVE METABOLIC PANEL
ALT: 16 U/L (ref 0–35)
AST: 16 U/L (ref 0–37)
Albumin: 3.4 g/dL — ABNORMAL LOW (ref 3.5–5.2)
Alkaline Phosphatase: 101 U/L (ref 39–117)
BUN: 14 mg/dL (ref 6–23)
CO2: 27 mEq/L (ref 19–32)
Calcium: 9.7 mg/dL (ref 8.4–10.5)
Chloride: 101 mEq/L (ref 96–112)
Creatinine, Ser: 0.7 mg/dL (ref 0.50–1.10)
GFR calc Af Amer: >90 mL/min (ref >90)
GFR calc non Af Amer: >90 mL/min (ref >90)
Glucose, Bld: 168 mg/dL — ABNORMAL HIGH (ref 70–99)
Potassium: 3.7 mEq/L (ref 3.5–5.1)
Sodium: 137 mEq/L (ref 135–145)
Total Bilirubin: 0.3 mg/dL (ref 0.3–1.2)
Total Protein: 7.8 g/dL (ref 6.0–8.3)

## 2011-11-12 LAB — PREGNANCY, URINE: Preg Test, Ur: NEGATIVE

## 2011-11-12 MED ORDER — CIPROFLOXACIN HCL 500 MG PO TABS: 500.0000 mg | ORAL_TABLET | Freq: Two times a day (BID) | ORAL | Status: AC

## 2011-11-12 MED ORDER — PROMETHAZINE HCL 25 MG/ML IJ SOLN
25.0000 mg | Freq: Once | INTRAMUSCULAR | Status: AC
  Administered 2011-11-12: 25 mg via INTRAMUSCULAR
  Filled 2011-11-12: qty 1

## 2011-11-12 MED ORDER — KETOROLAC TROMETHAMINE 60 MG/2ML IM SOLN
60.0000 mg | Freq: Once | INTRAMUSCULAR | Status: AC
  Administered 2011-11-12: 60 mg via INTRAMUSCULAR
  Filled 2011-11-12: qty 2

## 2011-11-12 MED ORDER — HYDROCODONE-ACETAMINOPHEN 5-500 MG PO TABS: 1.0000 | ORAL_TABLET | Freq: Four times a day (QID) | ORAL | Status: AC | PRN

## 2011-11-12 NOTE — ED Notes (Signed)
Pt states she has had RLQ pain, nausea, H/A and fever off and on x 2 months. Seen here and at Dr for same. Dx'd with muscle spasms.

## 2011-11-12 NOTE — ED Provider Notes (Signed)
History   This chart was scribed for Geoffery Lyons, MD by Shari Heritage. The patient was seen in room MH11/MH11. Patient's care was started at 2011.     CSN: [id number]  Arrival date & time 11/12/11  2011   First MD Initiated Contact with Patient 11/12/11 2044      Chief Complaint  Patient presents with  . Abdominal Pain    (Consider location/radiation/quality/duration/timing/severity/associated sxs/prior treatment) HPI [name] is a 41 y.o. female who presents to the Emergency Department complaining of burning, moderate to severe right-sided abdominal pain onset more than 1 month ago. Associated symptoms include low fever and nausea. Patient says that she had never had pain like this before 1 month ago, but she has a history of abdominal surgery. Patient denies urinary symptoms. She has a medical h/o diet-controlled diabetes, GERD and cellulitis. She has surgical h/o cholecystectomy, knee arthroscopy and laparoscopic tubal ligation.   Patient was seen once at [facility] and by her own physician for the same symptoms this month. Previous Chart Review: Patient was seen on 10/20/11 by Dr. Forbes Cellar complaining of RLQ abdominal pain with associated nausea and fever for 7 hours PTA. Patient denied vomiting, constipation, vaginal discharge, hematuria, dysuria or urgency of urination. Patient has urinalysis, metabolic panel, hepatic function panel, CBC and CT of abdomen done. All results were unremarkable. MDM notes suggest that patient was having abdominal muscle spasms. Patient was discharged with Flexeril and Ibuprofen.  Past Medical History  Diagnosis Date  . Diabetes mellitus   . GERD (gastroesophageal reflux disease)   . Cellulitis 07/2011    Past Surgical History  Procedure Date  . Cholecystectomy   . Knee arthroscopy   . Laparoscopic tubal ligation 04/29/2011    Procedure: LAPAROSCOPIC TUBAL LIGATION;  Surgeon: Turner Daniels, MD;  Location: [facility];  Service: Gynecology;   Laterality: Bilateral;  with Filshie Clips    History reviewed. No pertinent family history.  History  Substance Use Topics  . Smoking status: Former Smoker -- 0.5 packs/day    Types: Cigarettes    Quit date: 07/22/2011  . Smokeless tobacco: Not on file  . Alcohol Use: Yes     occasional    OB History    Grav Para Term Preterm Abortions TAB SAB Ect Mult Living                  Review of Systems A complete 10 system review of systems was obtained and all systems are negative except as noted in the HPI and PMH.   Allergies  Adhesive  Home Medications   Current Outpatient Rx  Name Route Sig Dispense Refill  . FUROSEMIDE 40 MG PO TABS Oral Take 1 tablet (40 mg total) by mouth daily. 30 tablet 3  . OMEPRAZOLE MAGNESIUM 20 MG PO TBEC Oral Take 20 mg by mouth daily. OTC Prilosec      BP 130/111  Pulse 98  Temp 97.5 F (36.4 C) (Oral)  Resp 20  Ht 5\' 1"  (1.549 m)  Wt 337 lb (152.862 kg)  BMI 63.68 kg/m2  SpO2 100%  Physical Exam  Nursing note and vitals reviewed. Constitutional: She is oriented to person, place, and time. She appears well-developed and well-nourished.  HENT:  Head: Normocephalic and atraumatic.  Eyes: Conjunctivae and EOM are normal. Pupils are equal, round, and reactive to light.  Neck: Normal range of motion. Neck supple.  Cardiovascular: Normal rate and regular rhythm.   Pulmonary/Chest: Effort normal and breath sounds  normal.  Abdominal: Soft. Bowel sounds are normal. There is tenderness (right-mid tender to palpation). There is no rebound and no guarding.       Abdomen is morbidly obese.  There is ttp in the right mid abdomen, but there is no ttp at [employer].  There is no rebound or guarding.  Bowel sounds are present and normoactive.  Musculoskeletal: Normal range of motion.  Neurological: She is alert and oriented to person, place, and time.  Skin: Skin is warm and dry.  Psychiatric: She has a normal mood and affect.    ED Course    Procedures (including critical care time) DIAGNOSTIC STUDIES: Oxygen Saturation is 100% on room air, normal by my interpretation.    COORDINATION OF CARE: 8:57pm- Patient informed of current plan for treatment and evaluation and agrees with plan at this time.    Results for orders placed during the hospital encounter of 11/12/11  PREGNANCY, URINE      Component Value Range   Preg Test, Ur NEGATIVE  NEGATIVE  URINALYSIS, ROUTINE W REFLEX MICROSCOPIC      Component Value Range   Color, Urine YELLOW  YELLOW   APPearance CLOUDY (*) CLEAR   Specific Gravity, Urine 1.040 (*) 1.005 - 1.030   pH 5.5  5.0 - 8.0   Glucose, UA NEGATIVE  NEGATIVE mg/dL   Hgb urine dipstick NEGATIVE  NEGATIVE   Bilirubin Urine NEGATIVE  NEGATIVE   Ketones, ur NEGATIVE  NEGATIVE mg/dL   Protein, ur NEGATIVE  NEGATIVE mg/dL   Urobilinogen, UA 0.2  0.0 - 1.0 mg/dL   Nitrite NEGATIVE  NEGATIVE   Leukocytes, UA SMALL (*) NEGATIVE  URINE MICROSCOPIC-ADD ON      Component Value Range   Squamous Epithelial / LPF MANY (*) RARE   WBC, UA 3-6  <3 WBC/hpf   Bacteria, UA MANY (*) RARE   Urine-Other MUCOUS PRESENT    CBC WITH DIFFERENTIAL      Component Value Range   WBC 11.8 (*) 4.0 - 10.5 K/uL   RBC 4.72  3.87 - 5.11 MIL/uL   Hemoglobin 12.3  12.0 - 15.0 g/dL   HCT 04.5  40.9 - 81.1 %   MCV 78.6  78.0 - 100.0 fL   MCH 26.1  26.0 - 34.0 pg   MCHC 33.2  30.0 - 36.0 g/dL   RDW 91.4  78.2 - 95.6 %   Platelets 342  150 - 400 K/uL   Neutrophils Relative 67  43 - 77 %   Neutro Abs 7.9 (*) 1.7 - 7.7 K/uL   Lymphocytes Relative 23  12 - 46 %   Lymphs Abs 2.7  0.7 - 4.0 K/uL   Monocytes Relative 8  3 - 12 %   Monocytes Absolute 0.9  0.1 - 1.0 K/uL   Eosinophils Relative 3  0 - 5 %   Eosinophils Absolute 0.4  0.0 - 0.7 K/uL   Basophils Relative 0  0 - 1 %   Basophils Absolute 0.0  0.0 - 0.1 K/uL  COMPREHENSIVE METABOLIC PANEL      Component Value Range   Sodium 137  135 - 145 mEq/L   Potassium 3.7  3.5 - 5.1  mEq/L   Chloride 101  96 - 112 mEq/L   CO2 27  19 - 32 mEq/L   Glucose, Bld 168 (*) 70 - 99 mg/dL   BUN 14  6 - 23 mg/dL   Creatinine, Ser 2.13  0.50 - 1.10 mg/dL  Calcium 9.7  8.4 - 10.5 mg/dL   Total Protein 7.8  6.0 - 8.3 g/dL   Albumin 3.4 (*) 3.5 - 5.2 g/dL   AST 16  0 - 37 U/L   ALT 16  0 - 35 U/L   Alkaline Phosphatase 101  39 - 117 U/L   Total Bilirubin 0.3  0.3 - 1.2 mg/dL   GFR calc non Af Amer >90  >90 mL/min   GFR calc Af Amer >90  >90 mL/min    No results found.   No diagnosis found.    MDM  The patient presents here for evaluation of pain in the right side of her abdomen.  She has been seen here and by her pcp for the same in the past three weeks.  A ct of the abdomen and pelvis and ultrasound have been obtained and have been negative.  She returns today with persistent pain.  She has had visits to the ER dating back over one year for similar complaints.  Thus far, no cause has been found.    Today, she is afebrile and appears in no acute distress.  She is morbidly obese (well in excess of 300 lbs).  I performed a workup that shows a mildly elevated wbc and ua that shows a uti.  She is not ttp at [place]'s point and there is no rebound or guarding.  Prior ct does not reveal appendicitis and I doubt this is the case today.  I suspect the wbc is related to a uti and will prescribe cipro.  I do not believe a repeat ct scan is indicated.    Review of her chart reveals frequent visits to the ER and pcp for various conditions I believe to be directly or indirectly related to obesity.  She was advised on the importance of weight loss and healthy lifestyle.       I personally performed the services described in this documentation, which was scribed in my presence. The recorded information has been reviewed and considered.      Geoffery Lyons, MD 11/13/11 [phone]

## 2011-11-12 NOTE — ED Notes (Signed)
Pt. Has noted edema in both ankles and feet with noted slightly pink skin on each lower leg.  No wounds or draining noted.

## 2011-11-12 NOTE — ED Notes (Signed)
Pt. In no distress and has husband at bedside.

## 2011-11-27 ENCOUNTER — Encounter (HOSPITAL_BASED_OUTPATIENT_CLINIC_OR_DEPARTMENT_OTHER): Payer: Self-pay | Admitting: *Deleted

## 2011-11-27 DIAGNOSIS — F17200 Nicotine dependence, unspecified, uncomplicated: Secondary | ICD-10-CM | POA: Insufficient documentation

## 2011-11-27 DIAGNOSIS — L03119 Cellulitis of unspecified part of limb: Secondary | ICD-10-CM | POA: Insufficient documentation

## 2011-11-27 DIAGNOSIS — K219 Gastro-esophageal reflux disease without esophagitis: Secondary | ICD-10-CM | POA: Insufficient documentation

## 2011-11-27 DIAGNOSIS — L02419 Cutaneous abscess of limb, unspecified: Secondary | ICD-10-CM | POA: Insufficient documentation

## 2011-11-27 DIAGNOSIS — E119 Type 2 diabetes mellitus without complications: Secondary | ICD-10-CM | POA: Insufficient documentation

## 2011-11-27 NOTE — ED Notes (Signed)
Patient states she has a reddened rash under both breasts that is burning and infection. Also right leg has rash on the front calf

## 2011-11-28 ENCOUNTER — Emergency Department (HOSPITAL_BASED_OUTPATIENT_CLINIC_OR_DEPARTMENT_OTHER)
Admission: EM | Admit: 2011-11-28 | Discharge: 2011-11-28 | Disposition: A | Discharge status: Home / Self Care | Payer: BC Managed Care – PPO | Attending: Emergency Medicine | Admitting: Emergency Medicine

## 2011-11-28 ENCOUNTER — Inpatient Hospital Stay (HOSPITAL_BASED_OUTPATIENT_CLINIC_OR_DEPARTMENT_OTHER): Admit: 2011-11-28 | Payer: BC Managed Care – PPO

## 2011-11-28 ENCOUNTER — Emergency Department (HOSPITAL_BASED_OUTPATIENT_CLINIC_OR_DEPARTMENT_OTHER): Payer: BC Managed Care – PPO

## 2011-11-28 DIAGNOSIS — L03119 Cellulitis of unspecified part of limb: Secondary | ICD-10-CM

## 2011-11-28 DIAGNOSIS — B372 Candidiasis of skin and nail: Secondary | ICD-10-CM

## 2011-11-28 MED ORDER — TERBINAFINE HCL 1 % EX CREA: TOPICAL_CREAM | CUTANEOUS | Status: DC

## 2011-11-28 MED ORDER — MOXIFLOXACIN HCL 400 MG PO TABS
400.0000 mg | ORAL_TABLET | Freq: Once | ORAL | Status: AC
  Administered 2011-11-28: 400 mg via ORAL
  Filled 2011-11-28: qty 1

## 2011-11-28 MED ORDER — MOXIFLOXACIN HCL 400 MG PO TABS: 400.0000 mg | ORAL_TABLET | Freq: Every day | ORAL | Status: AC

## 2011-11-28 MED ORDER — ALIGN PO CAPS: ORAL_CAPSULE | ORAL | Status: DC

## 2011-11-28 NOTE — ED Notes (Signed)
Awaiting MD evaluation, spouse remains at bedside. No distress

## 2011-11-28 NOTE — ED Provider Notes (Addendum)
History     CSN: [id number]  Arrival date & time 11/27/11  2144   First MD Initiated Contact with Patient 11/28/11 0118      Chief Complaint  Patient presents with  . Rash    (Consider location/radiation/quality/duration/timing/severity/associated sxs/prior treatment) HPI Is a 41 year old white female with a history of chronic edema of both legs. She has had multiple episodes of cellulitis of her lower legs. She is here with increased swelling and erythema of the right lower leg. There is mild pain associated with it. She first noticed this morning. She's had a low-grade fever to 100.2 at home. She has also had a headache, malaise and some nausea. She is also here with an intertriginous rash under both breasts. This rash is erythematous and weeping yellow fluid.  Past Medical History  Diagnosis Date  . Diabetes mellitus   . GERD (gastroesophageal reflux disease)   . Cellulitis 07/2011    Past Surgical History  Procedure Date  . Cholecystectomy   . Knee arthroscopy   . Laparoscopic tubal ligation 04/29/2011    Procedure: LAPAROSCOPIC TUBAL LIGATION;  Surgeon: Turner Daniels, MD;  Location: [facility];  Service: Gynecology;  Laterality: Bilateral;  with Filshie Clips    No family history on file.  History  Substance Use Topics  . Smoking status: Current Some Day Smoker -- 0.5 packs/day    Types: Cigarettes    Last Attempt to Quit: 07/22/2011  . Smokeless tobacco: Not on file  . Alcohol Use: Yes     occasional    OB History    Grav Para Term Preterm Abortions TAB SAB Ect Mult Living                  Review of Systems  All other systems reviewed and are negative.    Allergies  Adhesive  Home Medications   Current Outpatient Rx  Name Route Sig Dispense Refill  . ACETAMINOPHEN 500 MG PO TABS Oral Take 1,000 mg by mouth every 6 (six) hours as needed. For pain    . FUROSEMIDE 40 MG PO TABS Oral Take 1 tablet (40 mg total) by mouth daily. 30 tablet 3  . METFORMIN HCL  PO Oral Take 1 tablet by mouth 2 (two) times daily.    Marland Kitchen OMEPRAZOLE MAGNESIUM 20 MG PO TBEC Oral Take 20 mg by mouth daily.       BP 115/78  Pulse 112  Temp 98.1 F (36.7 C) (Oral)  Resp 20  Ht 5' 1.25" (1.556 m)  Wt 337 lb (152.862 kg)  BMI 63.16 kg/m2  SpO2 100%  Physical Exam General: Well-developed, well-nourished female in no acute distress; appearance consistent with age of record HENT: normocephalic, atraumatic Eyes: pupils equal round and reactive to light; extraocular muscles intact Neck: supple Heart: regular rate and rhythm Lungs: clear to auscultation bilaterally Abdomen: soft; nondistended Extremities: Chronic-appearing edema of lower extremities, right greater than left; mild erythema and warmth of the right lower leg with minimal tenderness Neurologic: Awake, alert and oriented; motor function intact in all extremities and symmetric; no facial droop Skin: Warm and dry; intertriginous rash underneath both breasts Psychiatric: Normal mood and affect    ED Course  Procedures (including critical care time)    MDM  Patient has had Doppler ultrasounds of the lower legs in the past without evidence of DVT. We will treat her for cellulitis and have her return for Doppler ultrasound later today.        Makalyn Lennox L  Dejuan Elman, MD 11/28/11 0136  Hanley Seamen, MD 11/28/11 [phone]

## 2012-05-24 ENCOUNTER — Emergency Department (HOSPITAL_COMMUNITY)
Admission: EM | Admit: 2012-05-24 | Discharge: 2012-05-24 | Disposition: A | Discharge status: Home / Self Care | Payer: BC Managed Care – PPO | Attending: Emergency Medicine | Admitting: Emergency Medicine

## 2012-05-24 ENCOUNTER — Encounter (HOSPITAL_COMMUNITY): Payer: Self-pay | Admitting: *Deleted

## 2012-05-24 DIAGNOSIS — R220 Localized swelling, mass and lump, head: Secondary | ICD-10-CM

## 2012-05-24 DIAGNOSIS — Z872 Personal history of diseases of the skin and subcutaneous tissue: Secondary | ICD-10-CM | POA: Insufficient documentation

## 2012-05-24 DIAGNOSIS — J320 Chronic maxillary sinusitis: Secondary | ICD-10-CM | POA: Insufficient documentation

## 2012-05-24 DIAGNOSIS — F17200 Nicotine dependence, unspecified, uncomplicated: Secondary | ICD-10-CM | POA: Insufficient documentation

## 2012-05-24 DIAGNOSIS — K219 Gastro-esophageal reflux disease without esophagitis: Secondary | ICD-10-CM | POA: Insufficient documentation

## 2012-05-24 DIAGNOSIS — E119 Type 2 diabetes mellitus without complications: Secondary | ICD-10-CM | POA: Insufficient documentation

## 2012-05-24 DIAGNOSIS — Z79899 Other long term (current) drug therapy: Secondary | ICD-10-CM | POA: Insufficient documentation

## 2012-05-24 LAB — POCT I-STAT, CHEM 8
BUN: 7 mg/dL (ref 6–23)
Calcium, Ion: 1.14 mmol/L (ref 1.12–1.23)
Chloride: 104 mEq/L (ref 96–112)
Creatinine, Ser: 0.7 mg/dL (ref 0.50–1.10)
Glucose, Bld: 199 mg/dL — ABNORMAL HIGH (ref 70–99)
HCT: 41 % (ref 36.0–46.0)
Hemoglobin: 13.9 g/dL (ref 12.0–15.0)
Potassium: 4 mEq/L (ref 3.5–5.1)
Sodium: 139 mEq/L (ref 135–145)
TCO2: 27 mmol/L (ref 0–100)

## 2012-05-24 LAB — CBC WITH DIFFERENTIAL/PLATELET
Basophils Absolute: 0 10*3/uL (ref 0.0–0.1)
Basophils Relative: 0 % (ref 0–1)
Eosinophils Absolute: 0.4 10*3/uL (ref 0.0–0.7)
Eosinophils Relative: 4 % (ref 0–5)
HCT: 38.7 % (ref 36.0–46.0)
Hemoglobin: 12.5 g/dL (ref 12.0–15.0)
Lymphocytes Relative: 27 % (ref 12–46)
Lymphs Abs: 2.6 10*3/uL (ref 0.7–4.0)
MCH: 26.3 pg (ref 26.0–34.0)
MCHC: 32.3 g/dL (ref 30.0–36.0)
MCV: 81.5 fL (ref 78.0–100.0)
Monocytes Absolute: 0.6 10*3/uL (ref 0.1–1.0)
Monocytes Relative: 6 % (ref 3–12)
Neutro Abs: 6.2 10*3/uL (ref 1.7–7.7)
Neutrophils Relative %: 63 % (ref 43–77)
Platelets: 353 10*3/uL (ref 150–400)
RBC: 4.75 MIL/uL (ref 3.87–5.11)
RDW: 14.1 % (ref 11.5–15.5)
WBC: 9.8 10*3/uL (ref 4.0–10.5)

## 2012-05-24 MED ORDER — DOXYCYCLINE HYCLATE 100 MG PO CAPS: 100.0000 mg | ORAL_CAPSULE | Freq: Two times a day (BID) | ORAL | Status: DC

## 2012-05-24 NOTE — ED Notes (Signed)
Pt has been feeling like her R nares is dry for the past few weeks.  Today she noticed a red bump below her eye and swelling/redness/warmth to the same area.  States her mother looked up her nose and noticed a large bump inside R nares.

## 2012-05-24 NOTE — ED Notes (Signed)
Pt given discharge paperwork; pt verbalized understanding of discharge; no additional questions by pt; e-signature obtained; VSS;

## 2012-05-24 NOTE — ED Provider Notes (Signed)
History     CSN: [id number]  Arrival date & time 05/24/12  4098   First MD Initiated Contact with Patient 05/24/12 2212      Chief Complaint  Patient presents with  . Facial Swelling    (Consider location/radiation/quality/duration/timing/severity/associated sxs/prior treatment) HPI Comments: [name] is a 42 y.o. female with history of diabetes and cellulitis presents emergency department complaining of right name dryness for the last couple of weeks.  patient reports that she woke up this morning and noticed that she had right sinus pressure with mild swelling.  She denies any fevers, night sweats, chills, trauma, recent infection, warmth or erythema to the area, pain with eye movements or change in vision.  The history is provided by the patient.    Past Medical History  Diagnosis Date  . Diabetes mellitus   . GERD (gastroesophageal reflux disease)   . Cellulitis 07/2011    Past Surgical History  Procedure Date  . Cholecystectomy   . Knee arthroscopy   . Laparoscopic tubal ligation 04/29/2011    Procedure: LAPAROSCOPIC TUBAL LIGATION;  Surgeon: Turner Daniels, MD;  Location: [facility];  Service: Gynecology;  Laterality: Bilateral;  with Filshie Clips    No family history on file.  History  Substance Use Topics  . Smoking status: Current Some Day Smoker -- 0.5 packs/day    Types: Cigarettes    Last Attempt to Quit: 07/22/2011  . Smokeless tobacco: Not on file  . Alcohol Use: Yes     Comment: occasional    OB History    Grav Para Term Preterm Abortions TAB SAB Ect Mult Living                  Review of Systems  All other systems reviewed and are negative.    Allergies  Adhesive  Home Medications   Current Outpatient Rx  Name  Route  Sig  Dispense  Refill  . FUROSEMIDE 40 MG PO TABS   Oral   Take 1 tablet (40 mg total) by mouth daily.   30 tablet   3   . METFORMIN HCL 500 MG PO TABS   Oral   Take 500 mg by mouth 2 (two) times daily with a  meal.         . OMEPRAZOLE MAGNESIUM 20 MG PO TBEC   Oral   Take 20 mg by mouth daily.          Marland Kitchen ALIGN 4 MG PO CAPS   Oral   Take 4 mg by mouth daily as needed. When taking antibiotics; Do not Take within 2 hours of taking the antibiotic         . ACETAMINOPHEN 500 MG PO TABS   Oral   Take 1,000 mg by mouth every 6 (six) hours as needed. For pain           BP 114/65  Pulse 91  Temp 97.5 F (36.4 C) (Oral)  Resp 18  SpO2 100%  Physical Exam  Nursing note and vitals reviewed. Constitutional: She is oriented to person, place, and time. She appears well-developed and well-nourished. No distress.  HENT:  Head: Normocephalic and atraumatic.  Nose: Sinus tenderness present. No mucosal edema.         No septal hematoma, septal deviation, or visible nasal deformity.  Positive rhinorrhea and mild scabbing  Eyes: Conjunctivae normal and EOM are normal.       No pain with extraocular movements.  Superior and inferior  orbits nontender to palpation without erythema or swelling  Neck: Normal range of motion.       Full range of motion without nuchal rigidity.  Pulmonary/Chest: Effort normal.  Musculoskeletal: Normal range of motion.  Neurological: She is alert and oriented to person, place, and time.  Skin: Skin is warm and dry. No rash noted. She is not diaphoretic.       No palpable warmth or visible erythema  Psychiatric: She has a normal mood and affect. Her behavior is normal.    ED Course  Procedures (including critical care time)  Labs Reviewed  POCT I-STAT, CHEM 8 - Abnormal; Notable for the following:    Glucose, Bld 199 (*)     All other components within normal limits  CBC WITH DIFFERENTIAL   No results found.   No diagnosis found.    MDM  Patient to ER complaining of right name or dryness and tenderness to palpation over right lateral nose and maxilla sinus.  Patient is afebrile, nontoxic and nonseptic appearing on arrival in no acute distress.  No  evidence of trauma or septal hematoma.  Airway appears intact.  No signs of superficial cellulitis or orbital infection.  Patient will be treated with an antibiotic for possible sinusitis with very strict return precautions if fever develops, pain with extraocular movements, warmth or erythema to the area or other signs concerning for infection spread.  Patient has been advised to followup with her primary care provider in the next 2 days.  Patient verbalizes understanding and is agreeable with plan.        Jaci Carrel, [place] 05/24/12 2246

## 2012-05-25 NOTE — ED Provider Notes (Signed)
Medical screening examination/treatment/procedure(s) were performed by non-physician practitioner and as supervising physician I was immediately available for consultation/collaboration.    Kecia Swoboda R Gari Hartsell, MD 05/25/12 0003 

## 2012-06-04 ENCOUNTER — Other Ambulatory Visit (HOSPITAL_COMMUNITY): Payer: Self-pay | Admitting: Family Medicine

## 2012-06-04 DIAGNOSIS — Z1231 Encounter for screening mammogram for malignant neoplasm of breast: Secondary | ICD-10-CM

## 2012-06-18 ENCOUNTER — Ambulatory Visit (HOSPITAL_COMMUNITY)
Admission: RE | Admit: 2012-06-18 | Discharge: 2012-06-18 | Disposition: A | Discharge status: Home / Self Care | Payer: Self-pay | Source: Ambulatory Visit | Attending: Family Medicine | Admitting: Family Medicine

## 2012-06-18 DIAGNOSIS — Z1231 Encounter for screening mammogram for malignant neoplasm of breast: Secondary | ICD-10-CM

## 2012-09-06 ENCOUNTER — Inpatient Hospital Stay (HOSPITAL_COMMUNITY)
Admission: EM | Admit: 2012-09-06 | Discharge: 2012-09-11 | DRG: 872 | Disposition: A | Discharge status: Home Health | Payer: BC Managed Care – PPO | Attending: Internal Medicine | Admitting: Internal Medicine

## 2012-09-06 ENCOUNTER — Emergency Department (HOSPITAL_COMMUNITY): Payer: Self-pay

## 2012-09-06 ENCOUNTER — Encounter (HOSPITAL_COMMUNITY): Payer: Self-pay | Admitting: *Deleted

## 2012-09-06 DIAGNOSIS — K219 Gastro-esophageal reflux disease without esophagitis: Secondary | ICD-10-CM | POA: Diagnosis present

## 2012-09-06 DIAGNOSIS — Z113 Encounter for screening for infections with a predominantly sexual mode of transmission: Secondary | ICD-10-CM

## 2012-09-06 DIAGNOSIS — R7881 Bacteremia: Principal | ICD-10-CM | POA: Diagnosis present

## 2012-09-06 DIAGNOSIS — Z79899 Other long term (current) drug therapy: Secondary | ICD-10-CM

## 2012-09-06 DIAGNOSIS — L0390 Cellulitis, unspecified: Secondary | ICD-10-CM

## 2012-09-06 DIAGNOSIS — I890 Lymphedema, not elsewhere classified: Secondary | ICD-10-CM

## 2012-09-06 DIAGNOSIS — E119 Type 2 diabetes mellitus without complications: Secondary | ICD-10-CM

## 2012-09-06 DIAGNOSIS — A491 Streptococcal infection, unspecified site: Secondary | ICD-10-CM

## 2012-09-06 DIAGNOSIS — Z6841 Body Mass Index (BMI) 40.0 and over, adult: Secondary | ICD-10-CM

## 2012-09-06 DIAGNOSIS — B951 Streptococcus, group B, as the cause of diseases classified elsewhere: Secondary | ICD-10-CM | POA: Diagnosis present

## 2012-09-06 DIAGNOSIS — R51 Headache: Secondary | ICD-10-CM

## 2012-09-06 DIAGNOSIS — R519 Headache, unspecified: Secondary | ICD-10-CM | POA: Diagnosis present

## 2012-09-06 DIAGNOSIS — A419 Sepsis, unspecified organism: Secondary | ICD-10-CM

## 2012-09-06 DIAGNOSIS — G971 Other reaction to spinal and lumbar puncture: Secondary | ICD-10-CM | POA: Diagnosis not present

## 2012-09-06 DIAGNOSIS — Y844 Aspiration of fluid as the cause of abnormal reaction of the patient, or of later complication, without mention of misadventure at the time of the procedure: Secondary | ICD-10-CM | POA: Diagnosis not present

## 2012-09-06 DIAGNOSIS — L02419 Cutaneous abscess of limb, unspecified: Secondary | ICD-10-CM | POA: Diagnosis present

## 2012-09-06 DIAGNOSIS — F17200 Nicotine dependence, unspecified, uncomplicated: Secondary | ICD-10-CM | POA: Diagnosis present

## 2012-09-06 DIAGNOSIS — L03119 Cellulitis of unspecified part of limb: Secondary | ICD-10-CM | POA: Diagnosis present

## 2012-09-06 DIAGNOSIS — E669 Obesity, unspecified: Secondary | ICD-10-CM

## 2012-09-06 DIAGNOSIS — R600 Localized edema: Secondary | ICD-10-CM

## 2012-09-06 DIAGNOSIS — R509 Fever, unspecified: Secondary | ICD-10-CM

## 2012-09-06 DIAGNOSIS — R609 Edema, unspecified: Secondary | ICD-10-CM

## 2012-09-06 LAB — COMPREHENSIVE METABOLIC PANEL
ALT: 21 U/L (ref 0–35)
AST: 24 U/L (ref 0–37)
Albumin: 3.3 g/dL — ABNORMAL LOW (ref 3.5–5.2)
Alkaline Phosphatase: 105 U/L (ref 39–117)
BUN: 8 mg/dL (ref 6–23)
CO2: 23 mEq/L (ref 19–32)
Calcium: 9.4 mg/dL (ref 8.4–10.5)
Chloride: 97 mEq/L (ref 96–112)
Creatinine, Ser: 0.63 mg/dL (ref 0.50–1.10)
GFR calc Af Amer: >90 mL/min (ref >90)
GFR calc non Af Amer: >90 mL/min (ref >90)
Glucose, Bld: 206 mg/dL — ABNORMAL HIGH (ref 70–99)
Potassium: 4 mEq/L (ref 3.5–5.1)
Sodium: 132 mEq/L — ABNORMAL LOW (ref 135–145)
Total Bilirubin: 0.5 mg/dL (ref 0.3–1.2)
Total Protein: 7.8 g/dL (ref 6.0–8.3)

## 2012-09-06 LAB — CBC
HCT: 39.2 % (ref 36.0–46.0)
Hemoglobin: 13.3 g/dL (ref 12.0–15.0)
MCH: 27.7 pg (ref 26.0–34.0)
MCHC: 33.9 g/dL (ref 30.0–36.0)
MCV: 81.5 fL (ref 78.0–100.0)
Platelets: 372 10*3/uL (ref 150–400)
RBC: 4.81 MIL/uL (ref 3.87–5.11)
RDW: 14.3 % (ref 11.5–15.5)
WBC: 26 10*3/uL — ABNORMAL HIGH (ref 4.0–10.5)

## 2012-09-06 LAB — CG4 I-STAT (LACTIC ACID): Lactic Acid, Venous: 1.85 mmol/L (ref 0.5–2.2)

## 2012-09-06 MED ORDER — DEXTROSE 5 % IV SOLN
2.0000 g | Freq: Once | INTRAVENOUS | Status: AC
  Administered 2012-09-07: 2 g via INTRAVENOUS
  Filled 2012-09-06: qty 2

## 2012-09-06 MED ORDER — SODIUM CHLORIDE 0.9 % IV BOLUS (SEPSIS)
1000.0000 mL | Freq: Once | INTRAVENOUS | Status: AC
  Administered 2012-09-06: 1000 mL via INTRAVENOUS

## 2012-09-06 MED ORDER — SODIUM CHLORIDE 0.9 % IV BOLUS (SEPSIS)
1000.0000 mL | Freq: Once | INTRAVENOUS | Status: AC
  Administered 2012-09-07: 1000 mL via INTRAVENOUS

## 2012-09-06 MED ORDER — VANCOMYCIN HCL IN DEXTROSE 1-5 GM/200ML-% IV SOLN
1000.0000 mg | Freq: Once | INTRAVENOUS | Status: DC
  Filled 2012-09-06: qty 200

## 2012-09-06 NOTE — ED Provider Notes (Signed)
History     CSN: [id number]  Arrival date & time 09/06/12  2139   First MD Initiated Contact with Patient 09/06/12 2259      Chief Complaint  Patient presents with  . Fever    (Consider location/radiation/quality/duration/timing/severity/associated sxs/prior treatment) HPI  Patient is a 42 yo woman woman with NID type II DM. She presents with complaints of abrupt onset of diffuse headache which she rates 10/10. Began spontaneously and abruptly around 1730 along with chills and subjective fever. Patient says she checked her temp with an aural thermometer just pta and it was 103.80F. She did not take any medications pta.   Patient says she felt her vision was a little blurry PTA but, is now denying any visual disturbances at present. Denies any other focal neurologic deficits or sx other than headache. Notes runny nose. Denies ST. Has minimally productive cough. No SOB. No abd pain. Fleetingly nauseated. No vomiting. No diarrhea and no GU sx. Patient is not sexually active.   No rash. No recent known tick exposure although patient spends a lot of time outside with her dog. 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 Denies any known ill contacts. No recent international travel.   Patient does not monitor her blood glucose levels. She denies history of similar sx.   Past Medical History  Diagnosis Date  . Diabetes mellitus   . GERD (gastroesophageal reflux disease)   . Cellulitis 07/2011    Past Surgical History  Procedure Laterality Date  . Cholecystectomy    . Knee arthroscopy    . Laparoscopic tubal ligation  04/29/2011    Procedure: LAPAROSCOPIC TUBAL LIGATION;  Surgeon: Turner Daniels, MD;  Location: [facility];  Service: Gynecology;  Laterality: Bilateral;  with Filshie Clips    History reviewed. No pertinent family history.  History  Substance Use Topics  . Smoking status: Current Some Day Smoker -- 0.50 packs/day    Types: Cigarettes   Last Attempt to Quit: 07/22/2011  . Smokeless tobacco: Not on file  . Alcohol Use: Yes     Comment: occasional    OB History   Grav Para Term Preterm Abortions TAB SAB Ect Mult Living                  Review of Systems  Gen: As per history of present illness, otherwise negative Eyes: no discharge or drainage, no occular pain or visual changes Nose: As per history of present illness, otherwise negative Mouth: no dental pain, no sore throat Neck: no neck pain Lungs: As per history of present illness, otherwise negative CV: no chest pain, palpitations, dependent edema or orthopnea Abd: As per history of present illness, otherwise negative GU: no dysuria or gross hematuria MSK: no myalgias or arthralgias Neuro: As per history of present illness, otherwise negative Skin: no rash Psyche: negative. Allergies  Adhesive  Home Medications   Current Outpatient Rx  Name  Route  Sig  Dispense  Refill  . acetaminophen (TYLENOL) 500 MG tablet   Oral   Take 1,000 mg by mouth every 6 (six) hours as needed. For pain         . omeprazole (PRILOSEC OTC) 20 MG tablet   Oral   Take 20 mg by mouth daily.           BP 121/85  Pulse 144  Temp(Src) 98.1 F (36.7 C) (Oral)  Resp 20  SpO2 100%  2334 on my  exam:  Temp 100F oral, pulse 129 bpm, RR 24/m, sats 99% RA  Physical Exam Gen: well developed and well nourished appearing, does not appear to be in acute distress. Smiling and cheerful affect. Head: NCAT Eyes: PERL, EOMI Nose: no epistaixis or rhinorrhea Mouth/throat: mucosa is moist and pink, posterior oropharynx is normal in appearance. Neck: supple, no stridor, no adenopathy, no nuchal rigidity Lungs: CTA B, no wheezing, rhonchi or rales CV: RRR, no murmur Abd: morbidly obese, soft, notender, nondistended Back: no ttp, no cva ttp Skin: no rashese, wnl Neuro: CN ii-xii grossly intact, no focal deficits, 5/5 motor strength both arms and legs Psyche; normal affect,  calm  and cooperative.   ED Course  Procedures (including critical care time)  Results for orders placed during the hospital encounter of 09/06/12 (from the past 24 hour(s))  CBC     Status: Abnormal   Collection Time    09/06/12  9:45 PM      Result Value Range   WBC 26.0 (*) 4.0 - 10.5 K/uL   RBC 4.81  3.87 - 5.11 MIL/uL   Hemoglobin 13.3  12.0 - 15.0 g/dL   HCT 16.1  09.6 - 04.5 %   MCV 81.5  78.0 - 100.0 fL   MCH 27.7  26.0 - 34.0 pg   MCHC 33.9  30.0 - 36.0 g/dL   RDW 40.9  81.1 - 91.4 %   Platelets 372  150 - 400 K/uL  COMPREHENSIVE METABOLIC PANEL     Status: Abnormal   Collection Time    09/06/12  9:45 PM      Result Value Range   Sodium 132 (*) 135 - 145 mEq/L   Potassium 4.0  3.5 - 5.1 mEq/L   Chloride 97  96 - 112 mEq/L   CO2 23  19 - 32 mEq/L   Glucose, Bld 206 (*) 70 - 99 mg/dL   BUN 8  6 - 23 mg/dL   Creatinine, Ser 7.82  0.50 - 1.10 mg/dL   Calcium 9.4  8.4 - 95.6 mg/dL   Total Protein 7.8  6.0 - 8.3 g/dL   Albumin 3.3 (*) 3.5 - 5.2 g/dL   AST 24  0 - 37 U/L   ALT 21  0 - 35 U/L   Alkaline Phosphatase 105  39 - 117 U/L   Total Bilirubin 0.5  0.3 - 1.2 mg/dL   GFR calc non Af Amer >90  >90 mL/min   GFR calc Af Amer >90  >90 mL/min  CG4 I-STAT (LACTIC ACID)     Status: None   Collection Time    09/06/12 10:10 PM      Result Value Range   Lactic Acid, Venous 1.85  0.5 - 2.2 mmol/L    CXR: normal cardiac silloute, normal appearing mediastinum, no infiltrates, no acute process identified.   Head CT negative for any acute findings.  EKG: nsr, no acute ischemic changes, normal intervals, normal axis, normal qrs complex   MDM  DDX:  Pneumonia, viral syndrome, sinusitis, UTI, URI, meningitis, tick borne illness.   Labs notable for WBC of 26K. Will order manual differential. CXR nondiagnostic. Results of U/A are pending. If this study is non-diagnostic, will proceed with LP to rule out meningitis.  We are resuscitating with crystalloid.       Brandt Loosen,  MD 09/07/12 [phone]

## 2012-09-06 NOTE — ED Notes (Signed)
Pt reports fever of 103 PTA.  Denies taking meds PTA.  Pt denies pain.  Reports slight nausea, no vomiting.  Denies urinary symptoms.  Denies cough.

## 2012-09-07 ENCOUNTER — Inpatient Hospital Stay (HOSPITAL_COMMUNITY): Payer: Self-pay

## 2012-09-07 ENCOUNTER — Emergency Department (HOSPITAL_COMMUNITY): Payer: Self-pay

## 2012-09-07 ENCOUNTER — Encounter (HOSPITAL_COMMUNITY): Payer: Self-pay | Admitting: Radiology

## 2012-09-07 DIAGNOSIS — R509 Fever, unspecified: Secondary | ICD-10-CM | POA: Diagnosis present

## 2012-09-07 DIAGNOSIS — R51 Headache: Secondary | ICD-10-CM

## 2012-09-07 DIAGNOSIS — R519 Headache, unspecified: Secondary | ICD-10-CM | POA: Diagnosis present

## 2012-09-07 DIAGNOSIS — R609 Edema, unspecified: Secondary | ICD-10-CM

## 2012-09-07 DIAGNOSIS — E119 Type 2 diabetes mellitus without complications: Secondary | ICD-10-CM

## 2012-09-07 LAB — CBC WITH DIFFERENTIAL/PLATELET
Basophils Absolute: 0 10*3/uL (ref 0.0–0.1)
Basophils Relative: 0 % (ref 0–1)
Eosinophils Absolute: 0 10*3/uL (ref 0.0–0.7)
Eosinophils Relative: 0 % (ref 0–5)
HCT: 38.3 % (ref 36.0–46.0)
Hemoglobin: 12.5 g/dL (ref 12.0–15.0)
Lymphocytes Relative: 3 % — ABNORMAL LOW (ref 12–46)
Lymphs Abs: 1 10*3/uL (ref 0.7–4.0)
MCH: 26.8 pg (ref 26.0–34.0)
MCHC: 32.6 g/dL (ref 30.0–36.0)
MCV: 82 fL (ref 78.0–100.0)
Monocytes Absolute: 1.1 10*3/uL — ABNORMAL HIGH (ref 0.1–1.0)
Monocytes Relative: 3 % (ref 3–12)
Neutro Abs: 30.9 10*3/uL — ABNORMAL HIGH (ref 1.7–7.7)
Neutrophils Relative %: 94 % — ABNORMAL HIGH (ref 43–77)
Platelets: 335 10*3/uL (ref 150–400)
RBC: 4.67 MIL/uL (ref 3.87–5.11)
RDW: 14.7 % (ref 11.5–15.5)
WBC: 34.5 10*3/uL — ABNORMAL HIGH (ref 4.0–10.5)

## 2012-09-07 LAB — GLUCOSE, CAPILLARY
Glucose-Capillary: 222 mg/dL — ABNORMAL HIGH (ref 70–99)
Glucose-Capillary: 192 mg/dL — ABNORMAL HIGH (ref 70–99)
Glucose-Capillary: 143 mg/dL — ABNORMAL HIGH (ref 70–99)
Glucose-Capillary: 143 mg/dL — ABNORMAL HIGH (ref 70–99)
Glucose-Capillary: 136 mg/dL — ABNORMAL HIGH (ref 70–99)

## 2012-09-07 LAB — CSF CELL COUNT WITH DIFFERENTIAL
RBC Count, CSF: 246 /mm3 — ABNORMAL HIGH
RBC Count, CSF: 5 /mm3 — ABNORMAL HIGH
Tube #: 1
Tube #: 4
WBC, CSF: 2 /mm3 (ref 0–5)
WBC, CSF: 2 /mm3 (ref 0–5)

## 2012-09-07 LAB — URINALYSIS, ROUTINE W REFLEX MICROSCOPIC
Bilirubin Urine: NEGATIVE
Glucose, UA: 100 mg/dL — AB
Hgb urine dipstick: NEGATIVE
Ketones, ur: 15 mg/dL — AB
Nitrite: NEGATIVE
Protein, ur: NEGATIVE mg/dL
Specific Gravity, Urine: 1.02 (ref 1.005–1.030)
Urobilinogen, UA: 1 mg/dL (ref 0.0–1.0)
pH: 8 (ref 5.0–8.0)

## 2012-09-07 LAB — HEMOGLOBIN A1C
Hgb A1c MFr Bld: 8.6 % — ABNORMAL HIGH (ref <5.7)
Mean Plasma Glucose: 200 mg/dL — ABNORMAL HIGH (ref <117)

## 2012-09-07 LAB — URINE MICROSCOPIC-ADD ON

## 2012-09-07 LAB — COMPREHENSIVE METABOLIC PANEL
ALT: 19 U/L (ref 0–35)
AST: 21 U/L (ref 0–37)
Albumin: 2.8 g/dL — ABNORMAL LOW (ref 3.5–5.2)
Alkaline Phosphatase: 90 U/L (ref 39–117)
BUN: 8 mg/dL (ref 6–23)
CO2: 20 mEq/L (ref 19–32)
Calcium: 8.7 mg/dL (ref 8.4–10.5)
Chloride: 101 mEq/L (ref 96–112)
Creatinine, Ser: 0.63 mg/dL (ref 0.50–1.10)
GFR calc Af Amer: >90 mL/min (ref >90)
GFR calc non Af Amer: >90 mL/min (ref >90)
Glucose, Bld: 205 mg/dL — ABNORMAL HIGH (ref 70–99)
Potassium: 3.8 mEq/L (ref 3.5–5.1)
Sodium: 135 mEq/L (ref 135–145)
Total Bilirubin: 0.6 mg/dL (ref 0.3–1.2)
Total Protein: 7 g/dL (ref 6.0–8.3)

## 2012-09-07 LAB — PROTEIN, CSF: Total  Protein, CSF: 17 mg/dL (ref 15–45)

## 2012-09-07 LAB — GRAM STAIN

## 2012-09-07 LAB — GLUCOSE, CSF: Glucose, CSF: 95 mg/dL — ABNORMAL HIGH (ref 43–76)

## 2012-09-07 LAB — PREGNANCY, URINE: Preg Test, Ur: NEGATIVE

## 2012-09-07 MED ORDER — SODIUM CHLORIDE 0.9 % IJ SOLN
3.0000 mL | Freq: Two times a day (BID) | INTRAMUSCULAR | Status: DC
  Administered 2012-09-07 – 2012-09-11 (×5): 3 mL via INTRAVENOUS

## 2012-09-07 MED ORDER — DIPHENHYDRAMINE HCL 50 MG/ML IJ SOLN
25.0000 mg | Freq: Two times a day (BID) | INTRAMUSCULAR | Status: DC
  Filled 2012-09-07: qty 1

## 2012-09-07 MED ORDER — DEXTROSE 5 % IV SOLN
2.0000 g | Freq: Two times a day (BID) | INTRAVENOUS | Status: DC
  Administered 2012-09-07: 2 g via INTRAVENOUS
  Filled 2012-09-07 (×2): qty 2

## 2012-09-07 MED ORDER — DOXYCYCLINE HYCLATE 100 MG PO TABS
100.0000 mg | ORAL_TABLET | Freq: Once | ORAL | Status: AC
  Administered 2012-09-07: 100 mg via ORAL
  Filled 2012-09-07: qty 1

## 2012-09-07 MED ORDER — ONDANSETRON HCL 4 MG PO TABS
4.0000 mg | ORAL_TABLET | Freq: Four times a day (QID) | ORAL | Status: DC | PRN
  Administered 2012-09-08: 4 mg via ORAL
  Filled 2012-09-07: qty 1

## 2012-09-07 MED ORDER — SODIUM CHLORIDE 0.9 % IV SOLN
INTRAVENOUS | Status: DC
  Administered 2012-09-07: 06:00:00 via INTRAVENOUS

## 2012-09-07 MED ORDER — BUTALBITAL-APAP-CAFFEINE 50-325-40 MG PO TABS
1.0000 | ORAL_TABLET | Freq: Four times a day (QID) | ORAL | Status: DC | PRN
  Administered 2012-09-09 – 2012-09-10 (×2): 1 via ORAL
  Filled 2012-09-07 (×2): qty 1

## 2012-09-07 MED ORDER — VANCOMYCIN HCL 10 G IV SOLR
1500.0000 mg | Freq: Two times a day (BID) | INTRAVENOUS | Status: DC
  Administered 2012-09-07 – 2012-09-09 (×4): 1500 mg via INTRAVENOUS
  Filled 2012-09-07 (×5): qty 1500

## 2012-09-07 MED ORDER — DIPHENHYDRAMINE HCL 50 MG/ML IJ SOLN
25.0000 mg | Freq: Two times a day (BID) | INTRAMUSCULAR | Status: DC
  Filled 2012-09-07 (×2): qty 0.5

## 2012-09-07 MED ORDER — DOXYCYCLINE HYCLATE 100 MG IV SOLR
100.0000 mg | Freq: Two times a day (BID) | INTRAVENOUS | Status: DC
  Filled 2012-09-07 (×2): qty 100

## 2012-09-07 MED ORDER — DIPHENHYDRAMINE HCL 50 MG/ML IJ SOLN
25.0000 mg | Freq: Once | INTRAMUSCULAR | Status: AC
  Administered 2012-09-07: 25 mg via INTRAVENOUS

## 2012-09-07 MED ORDER — DEXTROSE 5 % IV SOLN
500.0000 mg | Freq: Three times a day (TID) | INTRAVENOUS | Status: DC
  Administered 2012-09-07: 500 mg via INTRAVENOUS
  Filled 2012-09-07 (×3): qty 10

## 2012-09-07 MED ORDER — DIPHENHYDRAMINE HCL 50 MG/ML IJ SOLN
25.0000 mg | Freq: Two times a day (BID) | INTRAMUSCULAR | Status: DC
  Administered 2012-09-07 – 2012-09-09 (×4): 25 mg via INTRAVENOUS
  Filled 2012-09-07: qty 0.5
  Filled 2012-09-07: qty 1
  Filled 2012-09-07: qty 0.5
  Filled 2012-09-07: qty 1
  Filled 2012-09-07 (×6): qty 0.5

## 2012-09-07 MED ORDER — SODIUM CHLORIDE 0.9 % IV SOLN
Freq: Once | INTRAVENOUS | Status: DC
  Filled 2012-09-07: qty 1000

## 2012-09-07 MED ORDER — ACETAMINOPHEN 325 MG PO TABS
650.0000 mg | ORAL_TABLET | Freq: Once | ORAL | Status: AC
  Administered 2012-09-07: 650 mg via ORAL
  Filled 2012-09-07: qty 2

## 2012-09-07 MED ORDER — VANCOMYCIN HCL 10 G IV SOLR
2500.0000 mg | Freq: Once | INTRAVENOUS | Status: AC
  Administered 2012-09-07: 2500 mg via INTRAVENOUS

## 2012-09-07 MED ORDER — VANCOMYCIN HCL 10 G IV SOLR
2500.0000 mg | Freq: Once | INTRAVENOUS | Status: DC
  Filled 2012-09-07: qty 2500

## 2012-09-07 MED ORDER — SODIUM CHLORIDE 0.9 % IV SOLN: INTRAVENOUS | Status: DC

## 2012-09-07 MED ORDER — VANCOMYCIN HCL 10 G IV SOLR: 1500.0000 mg | Freq: Two times a day (BID) | INTRAVENOUS | Status: DC

## 2012-09-07 MED ORDER — DOXYCYCLINE HYCLATE 100 MG IV SOLR
100.0000 mg | Freq: Two times a day (BID) | INTRAVENOUS | Status: DC
  Administered 2012-09-07: 100 mg via INTRAVENOUS
  Filled 2012-09-07: qty 100

## 2012-09-07 MED ORDER — ACETAMINOPHEN 650 MG RE SUPP: 650.0000 mg | Freq: Four times a day (QID) | RECTAL | Status: DC | PRN

## 2012-09-07 MED ORDER — ONDANSETRON HCL 4 MG/2ML IJ SOLN: 4.0000 mg | Freq: Four times a day (QID) | INTRAMUSCULAR | Status: DC | PRN

## 2012-09-07 MED ORDER — ACYCLOVIR SODIUM 50 MG/ML IV SOLN
500.0000 mg | Freq: Three times a day (TID) | INTRAVENOUS | Status: DC
  Filled 2012-09-07: qty 10

## 2012-09-07 MED ORDER — ACETAMINOPHEN 325 MG PO TABS
650.0000 mg | ORAL_TABLET | Freq: Four times a day (QID) | ORAL | Status: DC | PRN
  Administered 2012-09-07 – 2012-09-09 (×4): 650 mg via ORAL
  Filled 2012-09-07 (×4): qty 2

## 2012-09-07 MED ORDER — INSULIN ASPART 100 UNIT/ML ~~LOC~~ SOLN
0.0000 [IU] | Freq: Three times a day (TID) | SUBCUTANEOUS | Status: DC
  Administered 2012-09-07 (×2): 1 [IU] via SUBCUTANEOUS
  Administered 2012-09-07 – 2012-09-08 (×2): 2 [IU] via SUBCUTANEOUS
  Administered 2012-09-08 – 2012-09-10 (×8): 1 [IU] via SUBCUTANEOUS
  Administered 2012-09-11: 2 [IU] via SUBCUTANEOUS

## 2012-09-07 MED ORDER — CAFFEINE-SODIUM BENZOATE 125-125 MG/ML IJ SOLN: 500.0000 mg | Freq: Once | INTRAMUSCULAR | Status: DC

## 2012-09-07 MED ORDER — DEXTROSE 5 % IV SOLN
500.0000 mg | Freq: Three times a day (TID) | INTRAVENOUS | Status: DC
  Administered 2012-09-07: 500 mg via INTRAVENOUS
  Filled 2012-09-07 (×2): qty 10

## 2012-09-07 MED ORDER — PIPERACILLIN-TAZOBACTAM 3.375 G IVPB
3.3750 g | Freq: Three times a day (TID) | INTRAVENOUS | Status: DC
  Administered 2012-09-07 – 2012-09-09 (×5): 3.375 g via INTRAVENOUS
  Filled 2012-09-07 (×7): qty 50

## 2012-09-07 NOTE — Progress Notes (Signed)
Right  lower extremity venous duplex completed.  Right:  No obvious evidence of DVT, superficial thrombosis, or Baker's cyst.  Left:  Negative for DVT in the common femoral vein.  Technically difficult study due to the patient's body habitus.  

## 2012-09-07 NOTE — H&P (Signed)
[facility] History and Physical  [name] ZOX:[id number] DOB: 1970/05/05 DOA: 09/06/2012  Referring physician: ER physician. PCP: Herb Grays, MD   Chief Complaint: Fever and headache.  HPI: [name] is a 42 y.o. female with known history of diabetes mellitus type 2 presently on no medications, history of pseudotumor cerebri, morbid obesity presented with complaints of fever and headache. Patient's symptoms started last evening around 5 PM. Headache is globally all over the head. Denies any associated visual symptoms or neck pain. Since patient's symptoms were persistent patient was brought to the ER. In the ER CT head was negative for any acute. Chest x-ray and UA unremarkable. Blood cultures and urine cultures were sent. At this time the source of fever is not clear but suspect patient will be having meningitis. And has been admitted for further management. Patient denies any nausea vomiting abdominal pain diarrhea chest pain shortness of breath or any productive cough. Denies any focal deficits. Denies any insect bites or recent travel.  Review of Systems: As presented in the history of presenting illness, rest negative.  Past Medical History  Diagnosis Date  . Diabetes mellitus   . GERD (gastroesophageal reflux disease)   . Cellulitis 07/2011   Past Surgical History  Procedure Laterality Date  . Cholecystectomy    . Knee arthroscopy    . Laparoscopic tubal ligation  04/29/2011    Procedure: LAPAROSCOPIC TUBAL LIGATION;  Surgeon: Turner Daniels, MD;  Location: [facility];  Service: Gynecology;  Laterality: Bilateral;  with Filshie Clips   Social History:  reports that she has been smoking Cigarettes.  She has been smoking about 0.50 packs per day. She does not have any smokeless tobacco history on file. She reports that  drinks alcohol. She reports that she does not use illicit drugs. Lives at home. where does patient live-- Can do ADLs. Can patient participate in  ADLs?  Allergies  Allergen Reactions  . Vancomycin   . Adhesive (Tape) Itching and Rash    History reviewed. No pertinent family history.    Prior to Admission medications   Medication Sig Start Date End Date Taking? Authorizing Provider  acetaminophen (TYLENOL) 500 MG tablet Take 1,000 mg by mouth every 6 (six) hours as needed. For pain   Yes Historical Provider, MD  omeprazole (PRILOSEC OTC) 20 MG tablet Take 20 mg by mouth daily.   Yes Historical Provider, MD   Physical Exam: Filed Vitals:   09/06/12 2143 09/07/12 0056 09/07/12 0147  BP: 121/85 115/50 111/64  Pulse: 144 133 131  Temp: 98.1 F (36.7 C) 102.8 F (39.3 C) 100.3 F (37.9 C)  TempSrc: Oral Oral Oral  Resp: 20 16 16   SpO2: 100% 97% 96%     General:  Well-developed and nourished.  Eyes: Anicteric no pallor. PERRLA positive.  ENT: No discharge from the ears eyes nose mouth.  Neck: No neck rigidity. No mass felt.  Cardiovascular: S1-S2 heard.  Respiratory: No rhonchi or crepitations.  Abdomen: Soft nontender bowel sounds present.  Skin: No rash.  Musculoskeletal: No edema.  Psychiatric: Appears normal.  Neurologic: Alert awake oriented to time place and person. Moves all extremities.  Labs on Admission:  Basic Metabolic Panel:  Recent Labs Lab 09/06/12 2145  NA 132*  K 4.0  CL 97  CO2 23  GLUCOSE 206*  BUN 8  CREATININE 0.63  CALCIUM 9.4   Liver Function Tests:  Recent Labs Lab 09/06/12 2145  AST 24  ALT 21  ALKPHOS  105  BILITOT 0.5  PROT 7.8  ALBUMIN 3.3*   No results found for this basename: LIPASE, AMYLASE,  in the last 168 hours No results found for this basename: AMMONIA,  in the last 168 hours CBC:  Recent Labs Lab 09/06/12 2145  WBC 26.0*  HGB 13.3  HCT 39.2  MCV 81.5  PLT 372   Cardiac Enzymes: No results found for this basename: CKTOTAL, CKMB, CKMBINDEX, TROPONINI,  in the last 168 hours  BNP (last 3 results) No results found for this basename: PROBNP,   in the last 8760 hours CBG: No results found for this basename: GLUCAP,  in the last 168 hours  Radiological Exams on Admission: Dg Chest 2 View  09/07/2012   *RADIOLOGY REPORT*  Clinical Data: Fever and chills.  CHEST - 2 VIEW  Comparison: 03/20/2010.  Findings: The cardiac silhouette, mediastinal and hilar contours are normal and stable.  There are mild chronic bronchitic type lung changes, likely related to smoking.  No infiltrates, edema or effusions.  The bony thorax is intact.  IMPRESSION:  1.  Chronic bronchitic type lung changes, likely related to smoking. 2.  No acute pulmonary findings.   Original Report Authenticated By: Rudie Meyer, M.D.   Ct Head Wo Contrast  09/07/2012   *RADIOLOGY REPORT*  Clinical Data: Fever, nausea and dizziness.  CT HEAD WITHOUT CONTRAST  Technique:  Contiguous axial images were obtained from the base of the skull through the vertex without contrast.  Comparison: None  Findings: The ventricles are normal.  No extra-axial fluid collections are seen.  The brainstem and cerebellum are unremarkable.  No acute intracranial findings such as infarction or hemorrhage.  No mass lesions.  The bony calvarium is intact.  The visualized paranasal sinuses and mastoid air cells are clear.  The parotid glands are enlarged bilaterally, right greater than left but are not completely imaged.  Recommend clinical correlation with any parotid area pain.  This may be a normal variant.  IMPRESSION:  1.  No acute intracranial findings or mass lesions. 2.  Enlarged parotid glands, right greater than left of uncertain significance.   Original Report Authenticated By: Rudie Meyer, M.D.     Assessment/Plan Principal Problem:   Fever Active Problems:   Diabetes mellitus   Obesity   Headache   1. Fever and headache suspect patient would be having meningitis - at this time since patient is morbidly obese ER physician had discussed the radiologist for fluoroscopy guided lumbar puncture.  For now patient has been empirically started on antibiotics to cover for meningitis which includes acyclovir, vancomycin, ceftriaxone, doxycycline. Patient previously has had red man reaction to vancomycin and patient has been placed on Benadryl IV prior to vancomycin dosing. 2. Diabetes mellitus type 2 - presently on no medications. Check hemoglobin A1c. Patient has been placed on sliding-scale coverage. 3. History of pseudotumor cerebri - patient states previously she had lumbar puncture done once and her symptoms had improved. 4. Morbid obesity.    Code Status: Full code.  Family Communication: Patient's mother at the bedside.  Disposition Plan: Admit to inpatient.    Garrison Michie N. [facility] Pager [phone].  If 7PM-7AM, please contact night-coverage [email or web address] Password [facility] 09/07/2012, 2:58 AM

## 2012-09-07 NOTE — ED Notes (Signed)
Patient transported to X-ray 

## 2012-09-07 NOTE — Progress Notes (Addendum)
ANTIBIOTIC CONSULT NOTE - INITIAL  Pharmacy Consult for  Zosyn   (continue Vancomycin)  Indication: RLE cellulitits;  r/o meningitis  Allergies  Allergen Reactions  . Vancomycin   . Adhesive (Tape) Itching and Rash    Patient Measurements: Height: 5\' 1"  (154.9 cm) Weight: 323 lb 8 oz (146.739 kg) IBW/kg (Calculated) : 47.8 Body Weight: 153 kg Ideal Body Weight:  48.7 kg  Vital Signs: Temp: 98.9 F (37.2 C) (05/23 1731) Temp src: Oral (05/23 1731) BP: 105/49 mmHg (05/23 1700) Pulse Rate: 62 (05/23 1700) Intake/Output from previous day: 05/22 0701 - 05/23 0700 In: 2000 [I.V.:2000] Out: -  Intake/Output from this shift:    Labs:  Recent Labs  09/06/12 2145 09/07/12 0500  WBC 26.0* 34.5*  HGB 13.3 12.5  PLT 372 335  CREATININE 0.63 0.63   Estimated Creatinine Clearance: 123.8 ml/min (by C-G formula based on Cr of 0.63). No results found for this basename: VANCOTROUGH, VANCOPEAK, VANCORANDOM, GENTTROUGH, GENTPEAK, GENTRANDOM, TOBRATROUGH, TOBRAPEAK, TOBRARND, AMIKACINPEAK, AMIKACINTROU, AMIKACIN,  in the last 72 hours   Microbiology: Recent Results (from the past 720 hour(s))  GRAM STAIN     Status: None   Collection Time    09/07/12  1:39 PM      Result Value Range Status   Specimen Description CSF   Final   Special Requests 2.3ML FLUID   Final   Gram Stain     Final   Value: CYTOSPIN PREP     NO WBC SEEN     NO ORGANISMS SEEN   Report Status 09/07/2012 FINAL   Final    Medical History: Past Medical History  Diagnosis Date  . Diabetes mellitus   . GERD (gastroesophageal reflux disease)   . Cellulitis 07/2011    Medications:  Prescriptions prior to admission  Medication Sig Dispense Refill  . acetaminophen (TYLENOL) 500 MG tablet Take 1,000 mg by mouth every 6 (six) hours as needed. For pain      . omeprazole (PRILOSEC OTC) 20 MG tablet Take 20 mg by mouth daily.       Assessment: Acyclovir and ceftriazone discontinued and now to start  Zosyn for  cellulitis in this 42 yo female. Admitted with fevers, headache and now has area of erythema, tenderness and streaking in her RLE c/w cellulitis.,  R/o meningitis.    She is to continue on the vancomycin.    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  Goal of Therapy:  Vancomycin trough level 15-20 mcg/ml  Plan:  Zosyn 3.375 g IV q8h (4hr infusion).  F/u cultures, renal function and clinical course.  Noah Delaine, RPh Clinical Pharmacist Pager: [phone] 09/07/2012,5:41 PM

## 2012-09-07 NOTE — Progress Notes (Signed)
ANTIBIOTIC CONSULT NOTE - INITIAL  Pharmacy Consult for vancomycin, acyclovir and rocephin Indication: r/o meningitis  Allergies  Allergen Reactions  . Vancomycin   . Adhesive (Tape) Itching and Rash    Patient Measurements:   Body Weight: 153 kg Ideal Body Weight:  48.7 kg  Vital Signs: Temp: 100.3 F (37.9 C) (05/23 0147) Temp src: Oral (05/23 0147) BP: 111/64 mmHg (05/23 0147) Pulse Rate: 131 (05/23 0147) Intake/Output from previous day: 05/22 0701 - 05/23 0700 In: 2000 [I.V.:2000] Out: -  Intake/Output from this shift: Total I/O In: 2000 [I.V.:2000] Out: -   Labs:  Recent Labs  09/06/12 2145  WBC 26.0*  HGB 13.3  PLT 372  CREATININE 0.63   The CrCl is unknown because both a height and weight (above a minimum accepted value) are required for this calculation. No results found for this basename: VANCOTROUGH, VANCOPEAK, VANCORANDOM, GENTTROUGH, GENTPEAK, GENTRANDOM, TOBRATROUGH, TOBRAPEAK, TOBRARND, AMIKACINPEAK, AMIKACINTROU, AMIKACIN,  in the last 72 hours   Microbiology: No results found for this or any previous visit (from the past 720 hour(s)).  Medical History: Past Medical History  Diagnosis Date  . Diabetes mellitus   . GERD (gastroesophageal reflux disease)   . Cellulitis 07/2011    Medications:   (Not in a hospital admission) Assessment: 42 yo lady to start broad spectrum antibiotics r/o meningitis.  She has a h/o Redman's syndrome so we will premedicate with benadryl and slow IV infusion rate.  Goal of Therapy:  Vancomycin trough level 15-20 mcg/ml  Plan:  Vancomycin 2500 mg IV X 1 then 1500 mg IV q12 hours. Rocephin 2 gm IV q12 hours. Acyclovir 500 mg IV q8 hours. F/u cultures, renal function and clinical course.    Talbert Cage Poteet 09/07/2012,3:04 AM

## 2012-09-07 NOTE — Procedures (Signed)
Lumbar puncture was performed at the L2-L3 level on theright using a 5 inch 21 gauge needle with return of clear colorless CSF with an opening pressure of 32 cm water measured prone through the needle.   14 ml of CSF were obtained for laboratory studies. See dictated radiology report.

## 2012-09-07 NOTE — ED Notes (Signed)
Patient transported to CT 

## 2012-09-07 NOTE — Progress Notes (Signed)
Inpatient Diabetes Program Recommendations  AACE/ADA: New Consensus Statement on Inpatient Glycemic Control (2013)  Target Ranges:  Prepandial:   less than 140 mg/dL      Peak postprandial:   less than 180 mg/dL (1-2 hours)      Critically ill patients:  140 - 180 mg/dL     Results for [name], [name] (MRN [id number]) as of 09/07/2012 09:52  Ref. Range 09/07/2012 05:56  Hemoglobin A1C Latest Range: <5.7 % 8.6 (H)    Admitted with fever and headache.  Has history of diabetes Type 2.    Per H&P, patient taking no diabetes medications prior to admission, however, per chart review, patient was discharged home on Metformin 500 mg bid + Glipizide 5 mg daily back on 08/04/11.  Unsure if patient has been taking her home oral diabetes medications.    When patient ready for d/c, please make sure she gets Rxs for Glipizide and Metformin.  Both of these can be purchased at [facility] for $4 a piece.   Will follow. Ambrose Finland RN, MSN, CDE Diabetes Coordinator Inpatient Diabetes Program [phone]

## 2012-09-07 NOTE — Progress Notes (Signed)
Pt seen and examined Admitted with fevers, headache and now has area of erythema, tenderness and streaking in her RLE c/w cellulitis Check dopplers r/o DVT Cellulitis may be the cause of her fevers, LP at 1pm under fluoro to r/o meningitis Once CSF analysis complete will tailor antibiotics  Zannie Cove, MD [phone]

## 2012-09-08 DIAGNOSIS — L0390 Cellulitis, unspecified: Secondary | ICD-10-CM

## 2012-09-08 DIAGNOSIS — A419 Sepsis, unspecified organism: Secondary | ICD-10-CM

## 2012-09-08 DIAGNOSIS — L0291 Cutaneous abscess, unspecified: Secondary | ICD-10-CM

## 2012-09-08 LAB — BASIC METABOLIC PANEL
BUN: 8 mg/dL (ref 6–23)
CO2: 21 mEq/L (ref 19–32)
Calcium: 8.5 mg/dL (ref 8.4–10.5)
Chloride: 104 mEq/L (ref 96–112)
Creatinine, Ser: 0.8 mg/dL (ref 0.50–1.10)
GFR calc Af Amer: >90 mL/min (ref >90)
GFR calc non Af Amer: 88 mL/min — ABNORMAL LOW (ref >90)
Glucose, Bld: 165 mg/dL — ABNORMAL HIGH (ref 70–99)
Potassium: 3.9 mEq/L (ref 3.5–5.1)
Sodium: 136 mEq/L (ref 135–145)

## 2012-09-08 LAB — CBC
HCT: 35.1 % — ABNORMAL LOW (ref 36.0–46.0)
Hemoglobin: 11.5 g/dL — ABNORMAL LOW (ref 12.0–15.0)
MCH: 26.9 pg (ref 26.0–34.0)
MCHC: 32.8 g/dL (ref 30.0–36.0)
MCV: 82 fL (ref 78.0–100.0)
Platelets: 290 10*3/uL (ref 150–400)
RBC: 4.28 MIL/uL (ref 3.87–5.11)
RDW: 14.9 % (ref 11.5–15.5)
WBC: 17.1 10*3/uL — ABNORMAL HIGH (ref 4.0–10.5)

## 2012-09-08 LAB — GLUCOSE, CAPILLARY
Glucose-Capillary: 146 mg/dL — ABNORMAL HIGH (ref 70–99)
Glucose-Capillary: 179 mg/dL — ABNORMAL HIGH (ref 70–99)
Glucose-Capillary: 126 mg/dL — ABNORMAL HIGH (ref 70–99)
Glucose-Capillary: 162 mg/dL — ABNORMAL HIGH (ref 70–99)

## 2012-09-08 MED ORDER — FUROSEMIDE 20 MG PO TABS
20.0000 mg | ORAL_TABLET | Freq: Every day | ORAL | Status: DC
  Administered 2012-09-08 – 2012-09-11 (×4): 20 mg via ORAL
  Filled 2012-09-08 (×6): qty 1

## 2012-09-08 MED ORDER — ENOXAPARIN SODIUM 30 MG/0.3ML ~~LOC~~ SOLN
30.0000 mg | SUBCUTANEOUS | Status: DC
  Administered 2012-09-08: 30 mg via SUBCUTANEOUS
  Filled 2012-09-08 (×3): qty 0.3

## 2012-09-08 NOTE — Progress Notes (Addendum)
[facility] PROGRESS NOTE  [name] WGN:[id number] DOB: 10-Mar-1971 DOA: 09/06/2012 PCP: Herb Grays, MD  Assessment/Plan: 1. RLE cellulitis -continue IV Vanc/Zosyn Day 2 -WBC improving, fevers down -dopplers negative -leg elevation -resume Po lasix   2. GPC bacteremia: Await speciation, continue abx as above Source most likely 1 Will ask ID to see tomorrow  3.DM: hbaic 8.6 -stable, SSI  4. Headache -improved, LP negative   Code Status: FULL Family Communication: d/w pt and mother at bedside  Disposition Plan: home when improved  Procedures:  LP  Antibiotics:  Vancomycin 5/23  Zosyn 5/23  HPI/Subjective: C/o headache, R leg redness improved still quite tender  Objective: Filed Vitals:   09/07/12 1700 09/07/12 1731 09/07/12 2054 09/08/12 0431  BP: 105/49  135/71 123/76  Pulse: 62  107 103  Temp:  98.9 F (37.2 C) 100.8 F (38.2 C) 98.3 F (36.8 C)  TempSrc:  Oral Oral Oral  Resp:   18 20  Height:      Weight:    147.238 kg (324 lb 9.6 oz)  SpO2:   97% 97%    Intake/Output Summary (Last 24 hours) at 09/08/12 1107 Last data filed at 09/08/12 0852  Gross per 24 hour  Intake    480 ml  Output      0 ml  Net    480 ml   Filed Weights   09/07/12 0334 09/08/12 0431  Weight: 146.739 kg (323 lb 8 oz) 147.238 kg (324 lb 9.6 oz)    Exam:   General:  AAOx3, no distress  Cardiovascular: S1S2/RRR  Respiratory:CTAB  Abdomen: soft, Nt, BS present  Ext: RLE erythema much improved, not as firey red as yesterday   Data Reviewed: Basic Metabolic Panel:  Recent Labs Lab 09/06/12 2145 09/07/12 0500 09/08/12 0615  NA 132* 135 136  K 4.0 3.8 3.9  CL 97 101 104  CO2 23 20 21   GLUCOSE 206* 205* 165*  BUN 8 8 8   CREATININE 0.63 0.63 0.80  CALCIUM 9.4 8.7 8.5   Liver Function Tests:  Recent Labs Lab 09/06/12 2145 09/07/12 0500  AST 24 21  ALT 21 19  ALKPHOS 105 90  BILITOT 0.5 0.6  PROT 7.8 7.0  ALBUMIN 3.3* 2.8*   No  results found for this basename: LIPASE, AMYLASE,  in the last 168 hours No results found for this basename: AMMONIA,  in the last 168 hours CBC:  Recent Labs Lab 09/06/12 2145 09/07/12 0500 09/08/12 0615  WBC 26.0* 34.5* 17.1*  NEUTROABS  --  30.9*  --   HGB 13.3 12.5 11.5*  HCT 39.2 38.3 35.1*  MCV 81.5 82.0 82.0  PLT 372 335 290   Cardiac Enzymes: No results found for this basename: CKTOTAL, CKMB, CKMBINDEX, TROPONINI,  in the last 168 hours BNP (last 3 results) No results found for this basename: PROBNP,  in the last 8760 hours CBG:  Recent Labs Lab 09/07/12 0811 09/07/12 1203 09/07/12 1700 09/07/12 2056 09/08/12 0759  GLUCAP 192* 143* 143* 136* 146*    Recent Results (from the past 240 hour(s))  CSF CULTURE     Status: None   Collection Time    09/07/12  1:39 PM      Result Value Range Status   Specimen Description CSF   Final   Special Requests 2.3ML FLUID   Final   Gram Stain     Final   Value: CYTOSPIN NO WBC SEEN     NO ORGANISMS SEEN  Performed at [facility]   Culture PENDING   Incomplete   Report Status PENDING   Incomplete  GRAM STAIN     Status: None   Collection Time    09/07/12  1:39 PM      Result Value Range Status   Specimen Description CSF   Final   Special Requests 2.3ML FLUID   Final   Gram Stain     Final   Value: CYTOSPIN PREP     NO WBC SEEN     NO ORGANISMS SEEN   Report Status 09/07/2012 FINAL   Final     Studies: Dg Chest 2 View  09/07/2012   *RADIOLOGY REPORT*  Clinical Data: Fever and chills.  CHEST - 2 VIEW  Comparison: 03/20/2010.  Findings: The cardiac silhouette, mediastinal and hilar contours are normal and stable.  There are mild chronic bronchitic type lung changes, likely related to smoking.  No infiltrates, edema or effusions.  The bony thorax is intact.  IMPRESSION:  1.  Chronic bronchitic type lung changes, likely related to smoking. 2.  No acute pulmonary findings.   Original Report Authenticated By: Rudie Meyer, M.D.   Ct Head Wo Contrast  09/07/2012   *RADIOLOGY REPORT*  Clinical Data: Fever, nausea and dizziness.  CT HEAD WITHOUT CONTRAST  Technique:  Contiguous axial images were obtained from the base of the skull through the vertex without contrast.  Comparison: None  Findings: The ventricles are normal.  No extra-axial fluid collections are seen.  The brainstem and cerebellum are unremarkable.  No acute intracranial findings such as infarction or hemorrhage.  No mass lesions.  The bony calvarium is intact.  The visualized paranasal sinuses and mastoid air cells are clear.  The parotid glands are enlarged bilaterally, right greater than left but are not completely imaged.  Recommend clinical correlation with any parotid area pain.  This may be a normal variant.  IMPRESSION:  1.  No acute intracranial findings or mass lesions. 2.  Enlarged parotid glands, right greater than left of uncertain significance.   Original Report Authenticated By: Rudie Meyer, M.D.   Dg Fluoro Guide Lumbar Puncture  09/07/2012   *RADIOLOGY REPORT*  Clinical Data:  Fever.  DIAGNOSTIC LUMBAR PUNCTURE UNDER FLUOROSCOPIC GUIDANCE  Fluoroscopy time:  7 seconds  Technique:  Time-out procedure was performed.  Informed consent was obtained from the patient prior to the procedure, including potential complications of headache, allergy, CSF leak and pain.  With the patient prone, the lower back was prepped with Betadine. 1% Lidocaine was used for local anesthesia.  Lumbar puncture was performed at the L2-L3 level on theright using a 5 inch 21 gauge needle with return of clear colorless CSF with an opening pressure of 32 cm water measured prone through the needle.   14 ml of CSF were obtained for laboratory studies.  The patient tolerated the procedure well and there were no apparent complications.  IMPRESSION: Diagnostic lumbar puncture performed without any complications.  Elevated opening pressure.   Original Report Authenticated By:  Carey Bullocks, M.D.    Scheduled Meds: . diphenhydrAMINE  25 mg Intravenous Q12H  . enoxaparin (LOVENOX) injection  30 mg Subcutaneous Q24H  . insulin aspart  0-9 Units Subcutaneous TID WC  . piperacillin-tazobactam (ZOSYN)  IV  3.375 g Intravenous Q8H  . sodium chloride  3 mL Intravenous Q12H  . vancomycin  1,500 mg Intravenous Q12H   Continuous Infusions: . sodium chloride 50 mL/hr (09/07/12 1729)    Principal  Problem:   Fever Active Problems:   Diabetes mellitus   Obesity   Headache    Time spent:    [facility]  [facility] Pager [phone]. If 7PM-7AM, please contact night-coverage at [email or web address], password [facility] 09/08/2012, 11:07 AM  LOS: 2 days

## 2012-09-08 NOTE — Progress Notes (Signed)
CRITICAL VALUE ALERT  Critical value received:  Gram positive cocci with chains on aerobic bottle   Date of notification:  09/08/12  Time of notification:  0340  Critical value read back:yes  Nurse who received alert:  Nikki Dom, RN  MD notified (1st page):  Burnadette Peter, NP  Time of first page:  [phone]  MD notified (2nd page):  Time of second page:  Responding MD:  Burnadette Peter, NP  Time MD responded:  0342  Pt on IV Vanc, no new interventions needed at this time.

## 2012-09-09 DIAGNOSIS — B951 Streptococcus, group B, as the cause of diseases classified elsewhere: Secondary | ICD-10-CM

## 2012-09-09 DIAGNOSIS — R7881 Bacteremia: Principal | ICD-10-CM

## 2012-09-09 DIAGNOSIS — L02419 Cutaneous abscess of limb, unspecified: Secondary | ICD-10-CM

## 2012-09-09 DIAGNOSIS — L03119 Cellulitis of unspecified part of limb: Secondary | ICD-10-CM

## 2012-09-09 LAB — GLUCOSE, CAPILLARY
Glucose-Capillary: 148 mg/dL — ABNORMAL HIGH (ref 70–99)
Glucose-Capillary: 141 mg/dL — ABNORMAL HIGH (ref 70–99)
Glucose-Capillary: 127 mg/dL — ABNORMAL HIGH (ref 70–99)
Glucose-Capillary: 112 mg/dL — ABNORMAL HIGH (ref 70–99)

## 2012-09-09 MED ORDER — ENOXAPARIN SODIUM 40 MG/0.4ML ~~LOC~~ SOLN
40.0000 mg | SUBCUTANEOUS | Status: DC
  Administered 2012-09-09 – 2012-09-11 (×3): 40 mg via SUBCUTANEOUS
  Filled 2012-09-09 (×3): qty 0.4

## 2012-09-09 MED ORDER — DEXTROSE 5 % IV SOLN
2.0000 g | INTRAVENOUS | Status: DC
  Administered 2012-09-09 – 2012-09-11 (×3): 2 g via INTRAVENOUS
  Filled 2012-09-09 (×3): qty 2

## 2012-09-09 NOTE — Progress Notes (Signed)
[facility] PROGRESS NOTE  [name] AVW:[id number] DOB: 1970/07/27 DOA: 09/06/2012 PCP: Herb Grays, MD  Assessment/Plan: 1. RLE cellulitis -improving -continue IV Vanc/Zosyn Day 2 -WBC improving, fevers down -dopplers negative -leg elevation -resumed Po lasix   2. Group B strep 1/2  bacteremia: Await sensitivity Source most likely 1 Will request ID consult  3.DM: hbaic 8.6 -stable, SSI  4. Headache -improved, LP negative   Code Status: FULL Family Communication: d/w pt and mother at bedside  Disposition Plan: home when improved  Procedures:  LP  Antibiotics:  Vancomycin 5/23  Zosyn 5/23  HPI/Subjective: C/o headache, R leg redness improved still quite tender  Objective: Filed Vitals:   09/08/12 2000 09/09/12 0000 09/09/12 0400 09/09/12 1336  BP: 119/56 111/62 93/53 121/63  Pulse: 95 95 86 87  Temp: 99.2 F (37.3 C) 98.3 F (36.8 C) 98.5 F (36.9 C) 98.6 F (37 C)  TempSrc: Oral Oral Oral Oral  Resp: 20 18 20 18   Height:      Weight:   148.009 kg (326 lb 4.8 oz)   SpO2: 100% 99% 100% 99%    Intake/Output Summary (Last 24 hours) at 09/09/12 1343 Last data filed at 09/09/12 0900  Gross per 24 hour  Intake    240 ml  Output      0 ml  Net    240 ml   Filed Weights   09/07/12 0334 09/08/12 0431 09/09/12 0400  Weight: 146.739 kg (323 lb 8 oz) 147.238 kg (324 lb 9.6 oz) 148.009 kg (326 lb 4.8 oz)    Exam:   General:  AAOx3, no distress  Cardiovascular: S1S2/RRR  Respiratory:CTAB  Abdomen: soft, Nt, BS present  Ext: RLE erythema much improved, not as firey red as yesterday   Data Reviewed: Basic Metabolic Panel:  Recent Labs Lab 09/06/12 2145 09/07/12 0500 09/08/12 0615  NA 132* 135 136  K 4.0 3.8 3.9  CL 97 101 104  CO2 23 20 21   GLUCOSE 206* 205* 165*  BUN 8 8 8   CREATININE 0.63 0.63 0.80  CALCIUM 9.4 8.7 8.5   Liver Function Tests:  Recent Labs Lab 09/06/12 2145 09/07/12 0500  AST 24 21  ALT 21  19  ALKPHOS 105 90  BILITOT 0.5 0.6  PROT 7.8 7.0  ALBUMIN 3.3* 2.8*   No results found for this basename: LIPASE, AMYLASE,  in the last 168 hours No results found for this basename: AMMONIA,  in the last 168 hours CBC:  Recent Labs Lab 09/06/12 2145 09/07/12 0500 09/08/12 0615  WBC 26.0* 34.5* 17.1*  NEUTROABS  --  30.9*  --   HGB 13.3 12.5 11.5*  HCT 39.2 38.3 35.1*  MCV 81.5 82.0 82.0  PLT 372 335 290   Cardiac Enzymes: No results found for this basename: CKTOTAL, CKMB, CKMBINDEX, TROPONINI,  in the last 168 hours BNP (last 3 results) No results found for this basename: PROBNP,  in the last 8760 hours CBG:  Recent Labs Lab 09/08/12 1120 09/08/12 1705 09/08/12 2048 09/09/12 0743 09/09/12 1215  GLUCAP 179* 126* 162* 148* 141*    Recent Results (from the past 240 hour(s))  CULTURE, BLOOD (ROUTINE X 2)     Status: None   Collection Time    09/07/12 12:35 AM      Result Value Range Status   Specimen Description BLOOD RIGHT ARM   Final   Special Requests BOTTLES DRAWN AEROBIC ONLY 10CC   Final   Culture  Setup Time 09/07/2012  05:00   Final   Culture     Final   Value: GROUP B STREP(S.AGALACTIAE)ISOLATED     Note: Gram Stain Report Called to,Read Back By and Verified With: MILDRED AT 0340 09/08/12 BY SNOLO   Report Status PENDING   Incomplete  CULTURE, BLOOD (ROUTINE X 2)     Status: None   Collection Time    09/07/12 12:39 AM      Result Value Range Status   Specimen Description BLOOD RIGHT HAND   Final   Special Requests BOTTLES DRAWN AEROBIC ONLY 10CC   Final   Culture  Setup Time 09/07/2012 05:00   Final   Culture     Final   Value:        BLOOD CULTURE RECEIVED NO GROWTH TO DATE CULTURE WILL BE HELD FOR 5 DAYS BEFORE ISSUING A FINAL NEGATIVE REPORT   Report Status PENDING   Incomplete  CSF CULTURE     Status: None   Collection Time    09/07/12  1:39 PM      Result Value Range Status   Specimen Description CSF   Final   Special Requests 2.3ML FLUID    Final   Gram Stain     Final   Value: CYTOSPIN NO WBC SEEN     NO ORGANISMS SEEN     Performed at [facility]   Culture NO GROWTH 2 DAYS   Final   Report Status PENDING   Incomplete  GRAM STAIN     Status: None   Collection Time    09/07/12  1:39 PM      Result Value Range Status   Specimen Description CSF   Final   Special Requests 2.3ML FLUID   Final   Gram Stain     Final   Value: CYTOSPIN PREP     NO WBC SEEN     NO ORGANISMS SEEN   Report Status 09/07/2012 FINAL   Final     Studies: Dg Fluoro Guide Lumbar Puncture  09/07/2012   *RADIOLOGY REPORT*  Clinical Data:  Fever.  DIAGNOSTIC LUMBAR PUNCTURE UNDER FLUOROSCOPIC GUIDANCE  Fluoroscopy time:  7 seconds  Technique:  Time-out procedure was performed.  Informed consent was obtained from the patient prior to the procedure, including potential complications of headache, allergy, CSF leak and pain.  With the patient prone, the lower back was prepped with Betadine. 1% Lidocaine was used for local anesthesia.  Lumbar puncture was performed at the L2-L3 level on theright using a 5 inch 21 gauge needle with return of clear colorless CSF with an opening pressure of 32 cm water measured prone through the needle.   14 ml of CSF were obtained for laboratory studies.  The patient tolerated the procedure well and there were no apparent complications.  IMPRESSION: Diagnostic lumbar puncture performed without any complications.  Elevated opening pressure.   Original Report Authenticated By: Carey Bullocks, M.D.    Scheduled Meds: . diphenhydrAMINE  25 mg Intravenous Q12H  . enoxaparin (LOVENOX) injection  40 mg Subcutaneous Q24H  . furosemide  20 mg Oral Daily  . insulin aspart  0-9 Units Subcutaneous TID WC  . piperacillin-tazobactam (ZOSYN)  IV  3.375 g Intravenous Q8H  . sodium chloride  3 mL Intravenous Q12H  . vancomycin  1,500 mg Intravenous Q12H   Continuous Infusions:    Principal Problem:   Fever Active Problems:    Diabetes mellitus   Obesity   Headache    Time spent:     [facility]  [facility] Pager [phone]. If 7PM-7AM, please contact night-coverage at [email or web address], password [facility] 09/09/2012, 1:43 PM  LOS: 3 days

## 2012-09-09 NOTE — Consult Note (Signed)
[facility]    Date of Admission:  09/06/2012  Date of Consult:  09/09/2012  Reason for Consult: Group B streptococcal bacteremia and cellulitis Referring Physician: Dr. Jomarie Longs   HPI: [name] is an 42 y.o. female.  With hx of morbid obesity, DM, what appears to be lymphedema changes and recurrent cellulitis, presented to the ED on [address] with  Fever and headache and underwent LP which was unremarkable. She had admission blood cultures drawn on admission has grown GBS. Since then also she has had development of cellulitis in RLE. She has been on vancomycin and zosyn.   Past Medical History  Diagnosis Date  . Diabetes mellitus   . GERD (gastroesophageal reflux disease)   . Cellulitis 07/2011    Past Surgical History  Procedure Laterality Date  . Cholecystectomy    . Knee arthroscopy    . Laparoscopic tubal ligation  04/29/2011    Procedure: LAPAROSCOPIC TUBAL LIGATION;  Surgeon: Turner Daniels, MD;  Location: [facility];  Service: Gynecology;  Laterality: Bilateral;  with Filshie Clips  ergies:   Allergies  Allergen Reactions  . Vancomycin   . Adhesive (Tape) Itching and Rash     Medications: I have reviewed patients current medications as documented in Epic Anti-infectives   Start     Dose/Rate Route Frequency Ordered Stop   09/09/12 1500  cefTRIAXone (ROCEPHIN) 2 g in dextrose 5 % 50 mL IVPB     2 g 100 mL/hr over 30 Minutes Intravenous Every 24 hours 09/09/12 1348     09/07/12 2000  vancomycin (VANCOCIN) 1,500 mg in sodium chloride 0.9 % 500 mL IVPB  Status:  Discontinued     1,500 mg 125 mL/hr over 240 Minutes Intravenous Every 12 hours 09/07/12 0735 09/09/12 1348   09/07/12 1900  piperacillin-tazobactam (ZOSYN) IVPB 3.375 g  Status:  Discontinued     3.375 g 12.5 mL/hr over 240 Minutes Intravenous 3 times per day 09/07/12 1741 09/09/12 1348   09/07/12 1800  doxycycline (VIBRAMYCIN) 100 mg in dextrose 5 % 250 mL IVPB  Status:   Discontinued     100 mg 125 mL/hr over 120 Minutes Intravenous Every 12 hours 09/07/12 0816 09/07/12 1654   09/07/12 1530  vancomycin (VANCOCIN) 1,500 mg in sodium chloride 0.9 % 500 mL IVPB  Status:  Discontinued     1,500 mg 125 mL/hr over 240 Minutes Intravenous Every 12 hours 09/07/12 0316 09/07/12 0735   09/07/12 1400  acyclovir (ZOVIRAX) 500 mg in dextrose 5 % 100 mL IVPB  Status:  Discontinued     500 mg 110 mL/hr over 60 Minutes Intravenous Every 8 hours 09/07/12 0735 09/07/12 0816   09/07/12 1300  acyclovir (ZOVIRAX) 500 mg in dextrose 5 % 100 mL IVPB  Status:  Discontinued     500 mg 110 mL/hr over 60 Minutes Intravenous Every 8 hours 09/07/12 0816 09/07/12 1654   09/07/12 1200  cefTRIAXone (ROCEPHIN) 2 g in dextrose 5 % 50 mL IVPB  Status:  Discontinued     2 g 100 mL/hr over 30 Minutes Intravenous Every 12 hours 09/07/12 0314 09/07/12 1654   09/07/12 0330  acyclovir (ZOVIRAX) 500 mg in dextrose 5 % 100 mL IVPB  Status:  Discontinued     500 mg 110 mL/hr over 60 Minutes Intravenous Every 8 hours 09/07/12 0314 09/07/12 0735   09/07/12 0330  vancomycin (VANCOCIN) 2,500 mg in sodium chloride 0.9 % 500 mL IVPB  2,500 mg 125 mL/hr over 240 Minutes Intravenous  Once 09/07/12 0319 09/07/12 1300   09/07/12 0315  vancomycin (VANCOCIN) 2,500 mg in sodium chloride 0.9 % 500 mL IVPB  Status:  Discontinued     2,500 mg 250 mL/hr over 120 Minutes Intravenous  Once 09/07/12 0314 09/07/12 0320   09/07/12 0300  doxycycline (VIBRAMYCIN) 100 mg in dextrose 5 % 250 mL IVPB  Status:  Discontinued     100 mg 125 mL/hr over 120 Minutes Intravenous Every 12 hours 09/07/12 0254 09/07/12 0816   09/07/12 0200  doxycycline (VIBRA-TABS) tablet 100 mg     100 mg Oral  Once 09/07/12 0155 09/07/12 0204   09/06/12 2345  vancomycin (VANCOCIN) IVPB 1000 mg/200 mL premix  Status:  Discontinued     1,000 mg 200 mL/hr over 60 Minutes Intravenous  Once 09/06/12 2335 09/07/12 0047   09/06/12 2345  cefTRIAXone  (ROCEPHIN) 2 g in dextrose 5 % 50 mL IVPB     2 g 100 mL/hr over 30 Minutes Intravenous  Once 09/06/12 2335 09/07/12 0146      Social History:  reports that she quit smoking about 13 months ago. Her smoking use included Cigarettes. She smoked 0.50 packs per day. She does not have any smokeless tobacco history on file. She reports that  drinks alcohol. She reports that she does not use illicit drugs.  History reviewed. No pertinent family history.  As in HPI and primary teams notes otherwise 12 point review of systems is negative  Blood pressure 121/63, pulse 87, temperature 98.6 F (37 C), temperature source Oral, resp. rate 18, height 5\' 1"  (1.549 m), weight 326 lb 4.8 oz (148.009 kg), SpO2 99.00%. General: Alert and awake, oriented x3, not in any acute distress. HEENT: anicteric sclera, EOMI, oropharynx clear and without exudate CVS regular rate, normal r,  no murmur rubs or gallops Chest: clear to auscultation bilaterally, no wheezing, rales or rhonchi Abdomen: soft nontender, nondistended, normal bowel sounds, Extremities: bilateral LYMPHEDEMA, cellulitic changes with intense erythema of right shin Neuro: nonfocal, strength and sensation intact   Results for orders placed during the hospital encounter of 09/06/12 (from the past 48 hour(s))  GLUCOSE, CAPILLARY     Status: Abnormal   Collection Time    09/07/12  8:56 PM      Result Value Range   Glucose-Capillary 136 (*) 70 - 99 mg/dL   Comment 1 Notify RN    CBC     Status: Abnormal   Collection Time    09/08/12  6:15 AM      Result Value Range   WBC 17.1 (*) 4.0 - 10.5 K/uL   RBC 4.28  3.87 - 5.11 MIL/uL   Hemoglobin 11.5 (*) 12.0 - 15.0 g/dL   HCT 16.1 (*) 09.6 - 04.5 %   MCV 82.0  78.0 - 100.0 fL   MCH 26.9  26.0 - 34.0 pg   MCHC 32.8  30.0 - 36.0 g/dL   RDW 40.9  81.1 - 91.4 %   Platelets 290  150 - 400 K/uL  BASIC METABOLIC PANEL     Status: Abnormal   Collection Time    09/08/12  6:15 AM      Result Value Range     Sodium 136  135 - 145 mEq/L   Potassium 3.9  3.5 - 5.1 mEq/L   Chloride 104  96 - 112 mEq/L   CO2 21  19 - 32 mEq/L   Glucose, Bld 165 (*) 70 -  99 mg/dL   BUN 8  6 - 23 mg/dL   Creatinine, Ser 1.47  0.50 - 1.10 mg/dL   Calcium 8.5  8.4 - 82.9 mg/dL   GFR calc non Af Amer 88 (*) >90 mL/min   GFR calc Af Amer >90  >90 mL/min   Comment:            The eGFR has been calculated     using the CKD EPI equation.     This calculation has not been     validated in all clinical     situations.     eGFR's persistently     <90 mL/min signify     possible Chronic Kidney Disease.  GLUCOSE, CAPILLARY     Status: Abnormal   Collection Time    09/08/12  7:59 AM      Result Value Range   Glucose-Capillary 146 (*) 70 - 99 mg/dL   Comment 1 Documented in Chart     Comment 2 Notify RN    GLUCOSE, CAPILLARY     Status: Abnormal   Collection Time    09/08/12 11:20 AM      Result Value Range   Glucose-Capillary 179 (*) 70 - 99 mg/dL   Comment 1 Notify RN    GLUCOSE, CAPILLARY     Status: Abnormal   Collection Time    09/08/12  5:05 PM      Result Value Range   Glucose-Capillary 126 (*) 70 - 99 mg/dL  GLUCOSE, CAPILLARY     Status: Abnormal   Collection Time    09/08/12  8:48 PM      Result Value Range   Glucose-Capillary 162 (*) 70 - 99 mg/dL   Comment 1 Notify RN    GLUCOSE, CAPILLARY     Status: Abnormal   Collection Time    09/09/12  7:43 AM      Result Value Range   Glucose-Capillary 148 (*) 70 - 99 mg/dL  GLUCOSE, CAPILLARY     Status: Abnormal   Collection Time    09/09/12 12:15 PM      Result Value Range   Glucose-Capillary 141 (*) 70 - 99 mg/dL   Comment 1 Notify RN    GLUCOSE, CAPILLARY     Status: Abnormal   Collection Time    09/09/12  4:50 PM      Result Value Range   Glucose-Capillary 127 (*) 70 - 99 mg/dL   Comment 1 Notify RN        Component Value Date/Time   SDES CSF 09/07/2012 1339   SDES CSF 09/07/2012 1339   SPECREQUEST 2.3ML FLUID 09/07/2012 1339    SPECREQUEST 2.3ML FLUID 09/07/2012 1339   CULT NO GROWTH 2 DAYS 09/07/2012 1339   REPTSTATUS PENDING 09/07/2012 1339   REPTSTATUS 09/07/2012 FINAL 09/07/2012 1339   No results found.   Recent Results (from the past 720 hour(s))  CULTURE, BLOOD (ROUTINE X 2)     Status: None   Collection Time    09/07/12 12:35 AM      Result Value Range Status   Specimen Description BLOOD RIGHT ARM   Final   Special Requests BOTTLES DRAWN AEROBIC ONLY 10CC   Final   Culture  Setup Time 09/07/2012 05:00   Final   Culture     Final   Value: GROUP B STREP(S.AGALACTIAE)ISOLATED     Note: Gram Stain Report Called to,Read Back By and Verified With: MILDRED AT 0340 09/08/12 BY SNOLO  Report Status PENDING   Incomplete  CULTURE, BLOOD (ROUTINE X 2)     Status: None   Collection Time    09/07/12 12:39 AM      Result Value Range Status   Specimen Description BLOOD RIGHT HAND   Final   Special Requests BOTTLES DRAWN AEROBIC ONLY 10CC   Final   Culture  Setup Time 09/07/2012 05:00   Final   Culture     Final   Value:        BLOOD CULTURE RECEIVED NO GROWTH TO DATE CULTURE WILL BE HELD FOR 5 DAYS BEFORE ISSUING A FINAL NEGATIVE REPORT   Report Status PENDING   Incomplete  CSF CULTURE     Status: None   Collection Time    09/07/12  1:39 PM      Result Value Range Status   Specimen Description CSF   Final   Special Requests 2.3ML FLUID   Final   Gram Stain     Final   Value: CYTOSPIN NO WBC SEEN     NO ORGANISMS SEEN     Performed at [facility]   Culture NO GROWTH 2 DAYS   Final   Report Status PENDING   Incomplete  GRAM STAIN     Status: None   Collection Time    09/07/12  1:39 PM      Result Value Range Status   Specimen Description CSF   Final   Special Requests 2.3ML FLUID   Final   Gram Stain     Final   Value: CYTOSPIN PREP     NO WBC SEEN     NO ORGANISMS SEEN   Report Status 09/07/2012 FINAL   Final     Impression/Recommendation  42 year old lady with morbid obesity, DM apparent  lymphedema, recurrent cellulitis now with yet another recurrence this time with GBS bacteremia  #1 GBS bacteremia: so far only in 1/2 cultures but given her presentation would take this as likely a true pathogen  --change to ROcephin 2 g daily --repeat blood cultures to ensure clearance --could consider imaging heart valves though onset here is acute with a non typical organism for endocarditis making endovascular involvement less unlikely  #2 Cellulitis: source for #1 continue rocephin, leg elevation  #3 Screening: check HIV  Thank you so much for this interesting consult  Dr. Ninetta Lights is back tomorrow.  [facility] [phone] (pager) [phone] (office) 09/09/2012, 6:58 PM  Paulette Blanch [place] 09/09/2012, 6:58 PM

## 2012-09-09 NOTE — Plan of Care (Signed)
Problem: Phase III Progression Outcomes Goal: Pain controlled on oral analgesia Outcome: Completed/Met Date Met:  09/09/12 Pt. Responded better to Fiorcet with Headache pain than tylenol

## 2012-09-10 LAB — CBC
HCT: 36.8 % (ref 36.0–46.0)
Hemoglobin: 11.9 g/dL — ABNORMAL LOW (ref 12.0–15.0)
MCH: 26.8 pg (ref 26.0–34.0)
MCHC: 32.3 g/dL (ref 30.0–36.0)
MCV: 82.9 fL (ref 78.0–100.0)
Platelets: 326 10*3/uL (ref 150–400)
RBC: 4.44 MIL/uL (ref 3.87–5.11)
RDW: 14.7 % (ref 11.5–15.5)
WBC: 12 10*3/uL — ABNORMAL HIGH (ref 4.0–10.5)

## 2012-09-10 LAB — CULTURE, BLOOD (ROUTINE X 2)

## 2012-09-10 LAB — BASIC METABOLIC PANEL
BUN: 6 mg/dL (ref 6–23)
CO2: 27 mEq/L (ref 19–32)
Calcium: 9 mg/dL (ref 8.4–10.5)
Chloride: 101 mEq/L (ref 96–112)
Creatinine, Ser: 0.68 mg/dL (ref 0.50–1.10)
GFR calc Af Amer: >90 mL/min (ref >90)
GFR calc non Af Amer: >90 mL/min (ref >90)
Glucose, Bld: 145 mg/dL — ABNORMAL HIGH (ref 70–99)
Potassium: 3.4 mEq/L — ABNORMAL LOW (ref 3.5–5.1)
Sodium: 137 mEq/L (ref 135–145)

## 2012-09-10 LAB — GLUCOSE, CAPILLARY
Glucose-Capillary: 139 mg/dL — ABNORMAL HIGH (ref 70–99)
Glucose-Capillary: 144 mg/dL — ABNORMAL HIGH (ref 70–99)
Glucose-Capillary: 131 mg/dL — ABNORMAL HIGH (ref 70–99)
Glucose-Capillary: 169 mg/dL — ABNORMAL HIGH (ref 70–99)

## 2012-09-10 LAB — CSF CULTURE W GRAM STAIN
Culture: NO GROWTH
Gram Stain: NONE SEEN

## 2012-09-10 MED ORDER — IBUPROFEN 600 MG PO TABS
600.0000 mg | ORAL_TABLET | ORAL | Status: AC | PRN
  Administered 2012-09-10 (×2): 600 mg via ORAL
  Filled 2012-09-10 (×2): qty 1

## 2012-09-10 NOTE — Progress Notes (Signed)
INFECTIOUS DISEASE PROGRESS NOTEID: [name] is a 42 y.o. female with   Principal Problem:   Fever Active Problems:   Diabetes mellitus   Obesity   Headache  Subjective: C/o headache (worried she has CSF leak from LP). C/o continued erythema RLE  Abtx:  Anti-infectives   Start     Dose/Rate Route Frequency Ordered Stop   09/09/12 1500  cefTRIAXone (ROCEPHIN) 2 g in dextrose 5 % 50 mL IVPB     2 g 100 mL/hr over 30 Minutes Intravenous Every 24 hours 09/09/12 1348     09/07/12 2000  vancomycin (VANCOCIN) 1,500 mg in sodium chloride 0.9 % 500 mL IVPB  Status:  Discontinued     1,500 mg 125 mL/hr over 240 Minutes Intravenous Every 12 hours 09/07/12 0735 09/09/12 1348   09/07/12 1900  piperacillin-tazobactam (ZOSYN) IVPB 3.375 g  Status:  Discontinued     3.375 g 12.5 mL/hr over 240 Minutes Intravenous 3 times per day 09/07/12 1741 09/09/12 1348   09/07/12 1800  doxycycline (VIBRAMYCIN) 100 mg in dextrose 5 % 250 mL IVPB  Status:  Discontinued     100 mg 125 mL/hr over 120 Minutes Intravenous Every 12 hours 09/07/12 0816 09/07/12 1654   09/07/12 1530  vancomycin (VANCOCIN) 1,500 mg in sodium chloride 0.9 % 500 mL IVPB  Status:  Discontinued     1,500 mg 125 mL/hr over 240 Minutes Intravenous Every 12 hours 09/07/12 0316 09/07/12 0735   09/07/12 1400  acyclovir (ZOVIRAX) 500 mg in dextrose 5 % 100 mL IVPB  Status:  Discontinued     500 mg 110 mL/hr over 60 Minutes Intravenous Every 8 hours 09/07/12 0735 09/07/12 0816   09/07/12 1300  acyclovir (ZOVIRAX) 500 mg in dextrose 5 % 100 mL IVPB  Status:  Discontinued     500 mg 110 mL/hr over 60 Minutes Intravenous Every 8 hours 09/07/12 0816 09/07/12 1654   09/07/12 1200  cefTRIAXone (ROCEPHIN) 2 g in dextrose 5 % 50 mL IVPB  Status:  Discontinued     2 g 100 mL/hr over 30 Minutes Intravenous Every 12 hours 09/07/12 0314 09/07/12 1654   09/07/12 0330  acyclovir (ZOVIRAX) 500 mg in dextrose 5 % 100 mL IVPB  Status:   Discontinued     500 mg 110 mL/hr over 60 Minutes Intravenous Every 8 hours 09/07/12 0314 09/07/12 0735   09/07/12 0330  vancomycin (VANCOCIN) 2,500 mg in sodium chloride 0.9 % 500 mL IVPB     2,500 mg 125 mL/hr over 240 Minutes Intravenous  Once 09/07/12 0319 09/07/12 1300   09/07/12 0315  vancomycin (VANCOCIN) 2,500 mg in sodium chloride 0.9 % 500 mL IVPB  Status:  Discontinued     2,500 mg 250 mL/hr over 120 Minutes Intravenous  Once 09/07/12 0314 09/07/12 0320   09/07/12 0300  doxycycline (VIBRAMYCIN) 100 mg in dextrose 5 % 250 mL IVPB  Status:  Discontinued     100 mg 125 mL/hr over 120 Minutes Intravenous Every 12 hours 09/07/12 0254 09/07/12 0816   09/07/12 0200  doxycycline (VIBRA-TABS) tablet 100 mg     100 mg Oral  Once 09/07/12 0155 09/07/12 0204   09/06/12 2345  vancomycin (VANCOCIN) IVPB 1000 mg/200 mL premix  Status:  Discontinued     1,000 mg 200 mL/hr over 60 Minutes Intravenous  Once 09/06/12 2335 09/07/12 0047   09/06/12 2345  cefTRIAXone (ROCEPHIN) 2 g in dextrose 5 % 50 mL IVPB     2  g 100 mL/hr over 30 Minutes Intravenous  Once 09/06/12 2335 09/07/12 0146      Medications:  Scheduled: . cefTRIAXone (ROCEPHIN)  IV  2 g Intravenous Q24H  . diphenhydrAMINE  25 mg Intravenous Q12H  . enoxaparin (LOVENOX) injection  40 mg Subcutaneous Q24H  . furosemide  20 mg Oral Daily  . insulin aspart  0-9 Units Subcutaneous TID WC  . sodium chloride  3 mL Intravenous Q12H    Objective: Vital signs in last 24 hours: Temp:  [98.4 F (36.9 C)-98.7 F (37.1 C)] 98.4 F (36.9 C) (05/26 0452) Pulse Rate:  [87-96] 91 (05/26 0919) Resp:  [18-20] 18 (05/26 0452) BP: (102-121)/(55-63) 106/56 mmHg (05/26 0919) SpO2:  [98 %-99 %] 98 % (05/26 0452)   General appearance: alert, cooperative, no distress and morbidly obese Extremities: normal light touch grossly. edema, erythema, blistering of RLE. mild tenderness  Lab Results  Recent Labs  09/08/12 0615 09/10/12 0430  WBC  17.1* 12.0*  HGB 11.5* 11.9*  HCT 35.1* 36.8  NA 136 137  K 3.9 3.4*  CL 104 101  CO2 21 27  BUN 8 6  CREATININE 0.80 0.68   Liver Panel No results found for this basename: PROT, ALBUMIN, AST, ALT, ALKPHOS, BILITOT, BILIDIR, IBILI,  in the last 72 hours Sedimentation Rate No results found for this basename: ESRSEDRATE,  in the last 72 hours C-Reactive Protein No results found for this basename: CRP,  in the last 72 hours  Microbiology: Recent Results (from the past 240 hour(s))  CULTURE, BLOOD (ROUTINE X 2)     Status: None   Collection Time    09/07/12 12:35 AM      Result Value Range Status   Specimen Description BLOOD RIGHT ARM   Final   Special Requests BOTTLES DRAWN AEROBIC ONLY 10CC   Final   Culture  Setup Time 09/07/2012 05:00   Final   Culture     Final   Value: GROUP B STREP(S.AGALACTIAE)ISOLATED     Note: Gram Stain Report Called to,Read Back By and Verified With: MILDRED AT 0340 09/08/12 BY SNOLO   Report Status 09/10/2012 FINAL   Final   Organism ID, Bacteria GROUP B STREP(S.AGALACTIAE)ISOLATED   Final  CULTURE, BLOOD (ROUTINE X 2)     Status: None   Collection Time    09/07/12 12:39 AM      Result Value Range Status   Specimen Description BLOOD RIGHT HAND   Final   Special Requests BOTTLES DRAWN AEROBIC ONLY 10CC   Final   Culture  Setup Time 09/07/2012 05:00   Final   Culture     Final   Value:        BLOOD CULTURE RECEIVED NO GROWTH TO DATE CULTURE WILL BE HELD FOR 5 DAYS BEFORE ISSUING A FINAL NEGATIVE REPORT   Report Status PENDING   Incomplete  CSF CULTURE     Status: None   Collection Time    09/07/12  1:39 PM      Result Value Range Status   Specimen Description CSF   Final   Special Requests 2.3ML FLUID   Final   Gram Stain     Final   Value: CYTOSPIN NO WBC SEEN     NO ORGANISMS SEEN     Performed at [facility]   Culture NO GROWTH 3 DAYS   Final   Report Status 09/10/2012 FINAL   Final  GRAM STAIN     Status: None  Collection Time      09/07/12  1:39 PM      Result Value Range Status   Specimen Description CSF   Final   Special Requests 2.3ML FLUID   Final   Gram Stain     Final   Value: CYTOSPIN PREP     NO WBC SEEN     NO ORGANISMS SEEN   Report Status 09/07/2012 FINAL   Final  CULTURE, BLOOD (ROUTINE X 2)     Status: None   Collection Time    09/09/12  4:01 PM      Result Value Range Status   Specimen Description BLOOD RIGHT HAND   Final   Special Requests BOTTLES DRAWN AEROBIC ONLY 7CC   Final   Culture  Setup Time 09/09/2012 22:46   Final   Culture     Final   Value:        BLOOD CULTURE RECEIVED NO GROWTH TO DATE CULTURE WILL BE HELD FOR 5 DAYS BEFORE ISSUING A FINAL NEGATIVE REPORT   Report Status PENDING   Incomplete  CULTURE, BLOOD (ROUTINE X 2)     Status: None   Collection Time    09/09/12  4:19 PM      Result Value Range Status   Specimen Description BLOOD LEFT FOREARM   Final   Special Requests BOTTLES DRAWN AEROBIC ONLY 2CC   Final   Culture  Setup Time 09/09/2012 22:48   Final   Culture     Final   Value:        BLOOD CULTURE RECEIVED NO GROWTH TO DATE CULTURE WILL BE HELD FOR 5 DAYS BEFORE ISSUING A FINAL NEGATIVE REPORT   Report Status PENDING   Incomplete    Studies/Results: No results found.   Assessment/Plan: Cellulitis (recurrent) GBS bacteremia DM Morbid Obesity Total days of antibiotics: 4 (ceftriaxone)  Would aim for 14 days of rx (as much IV as possible due to bacteremia. She and her family member in room would like PIC so she can have home anbx).  Diabetic control Weight loss She may need home compressive devices for her legs.          Johny Sax Infect[facility] [phone] [email or web address] 09/10/2012, 11:58 AM   LOS: 4 days

## 2012-09-10 NOTE — Progress Notes (Signed)
[facility] PROGRESS NOTE  [name] JXB:[id number] DOB: [birth year] DOA: 09/06/2012 PCP: Herb Grays, MD  Assessment/Plan: 1. RLE cellulitis -improving -initially on IV Vanc/Zosyn for 2 days then changed to IV rocephin 5/25 -WBC improving, fevers down -dopplers negative -leg elevation -resumed Po lasix   2. Group B strep 1/2  bacteremia: Continue IV ceftriaxone Day 2/14 Source most likely 1 Appreciate ID consult Place PICC  3.DM: hbaic 8.6 -stable, SSI  4. Post LP headache - LP negative -ibuprofen PRN  Code Status: FULL Family Communication: d/w pt and mother at bedside  Disposition Plan: home tomorrow  Procedures:  LP  Antibiotics:  Vancomycin 5/23  Zosyn 5/23  HPI/Subjective: C/o headache, R leg redness minimally improved still quite tender  Objective: Filed Vitals:   09/09/12 2156 09/10/12 0452 09/10/12 0919 09/10/12 1328  BP: 119/55 102/62 106/56 121/85  Pulse: 96 91 91 93  Temp: 98.7 F (37.1 C) 98.4 F (36.9 C)  98.1 F (36.7 C)  TempSrc: Oral Oral  Oral  Resp: 20 18  18   Height:      Weight:      SpO2: 99% 98%  93%    Intake/Output Summary (Last 24 hours) at 09/10/12 1423 Last data filed at 09/10/12 0900  Gross per 24 hour  Intake      3 ml  Output      0 ml  Net      3 ml   Filed Weights   09/07/12 0334 09/08/12 0431 09/09/12 0400  Weight: 146.739 kg (323 lb 8 oz) 147.238 kg (324 lb 9.6 oz) 148.009 kg (326 lb 4.8 oz)    Exam:   General:  AAOx3, no distress  Cardiovascular: S1S2/RRR  Respiratory:CTAB  Abdomen: soft, Nt, BS present  Ext: RLE erythema much improved, not as firey red as yesterday   Data Reviewed: Basic Metabolic Panel:  Recent Labs Lab 09/06/12 2145 09/07/12 0500 09/08/12 0615 09/10/12 0430  NA 132* 135 136 137  K 4.0 3.8 3.9 3.4*  CL 97 101 104 101  CO2 23 20 21 27   GLUCOSE 206* 205* 165* 145*  BUN 8 8 8 6   CREATININE 0.63 0.63 0.80 0.68  CALCIUM 9.4 8.7 8.5 9.0   Liver  Function Tests:  Recent Labs Lab 09/06/12 2145 09/07/12 0500  AST 24 21  ALT 21 19  ALKPHOS 105 90  BILITOT 0.5 0.6  PROT 7.8 7.0  ALBUMIN 3.3* 2.8*   No results found for this basename: LIPASE, AMYLASE,  in the last 168 hours No results found for this basename: AMMONIA,  in the last 168 hours CBC:  Recent Labs Lab 09/06/12 2145 09/07/12 0500 09/08/12 0615 09/10/12 0430  WBC 26.0* 34.5* 17.1* 12.0*  NEUTROABS  --  30.9*  --   --   HGB 13.3 12.5 11.5* 11.9*  HCT 39.2 38.3 35.1* 36.8  MCV 81.5 82.0 82.0 82.9  PLT 372 335 290 326   Cardiac Enzymes: No results found for this basename: CKTOTAL, CKMB, CKMBINDEX, TROPONINI,  in the last 168 hours BNP (last 3 results) No results found for this basename: PROBNP,  in the last 8760 hours CBG:  Recent Labs Lab 09/09/12 1215 09/09/12 1650 09/09/12 2154 09/10/12 0725 09/10/12 1202  GLUCAP 141* 127* 112* 139* 144*    Recent Results (from the past 240 hour(s))  CULTURE, BLOOD (ROUTINE X 2)     Status: None   Collection Time    09/07/12 12:35 AM      Result Value  Range Status   Specimen Description BLOOD RIGHT ARM   Final   Special Requests BOTTLES DRAWN AEROBIC ONLY 10CC   Final   Culture  Setup Time 09/07/2012 05:00   Final   Culture     Final   Value: GROUP B STREP(S.AGALACTIAE)ISOLATED     Note: Gram Stain Report Called to,Read Back By and Verified With: MILDRED AT 0340 09/08/12 BY SNOLO   Report Status 09/10/2012 FINAL   Final   Organism ID, Bacteria GROUP B STREP(S.AGALACTIAE)ISOLATED   Final  CULTURE, BLOOD (ROUTINE X 2)     Status: None   Collection Time    09/07/12 12:39 AM      Result Value Range Status   Specimen Description BLOOD RIGHT HAND   Final   Special Requests BOTTLES DRAWN AEROBIC ONLY 10CC   Final   Culture  Setup Time 09/07/2012 05:00   Final   Culture     Final   Value:        BLOOD CULTURE RECEIVED NO GROWTH TO DATE CULTURE WILL BE HELD FOR 5 DAYS BEFORE ISSUING A FINAL NEGATIVE REPORT    Report Status PENDING   Incomplete  CSF CULTURE     Status: None   Collection Time    09/07/12  1:39 PM      Result Value Range Status   Specimen Description CSF   Final   Special Requests 2.3ML FLUID   Final   Gram Stain     Final   Value: CYTOSPIN NO WBC SEEN     NO ORGANISMS SEEN     Performed at [facility]   Culture NO GROWTH 3 DAYS   Final   Report Status 09/10/2012 FINAL   Final  GRAM STAIN     Status: None   Collection Time    09/07/12  1:39 PM      Result Value Range Status   Specimen Description CSF   Final   Special Requests 2.3ML FLUID   Final   Gram Stain     Final   Value: CYTOSPIN PREP     NO WBC SEEN     NO ORGANISMS SEEN   Report Status 09/07/2012 FINAL   Final  CULTURE, BLOOD (ROUTINE X 2)     Status: None   Collection Time    09/09/12  4:01 PM      Result Value Range Status   Specimen Description BLOOD RIGHT HAND   Final   Special Requests BOTTLES DRAWN AEROBIC ONLY 7CC   Final   Culture  Setup Time 09/09/2012 22:46   Final   Culture     Final   Value:        BLOOD CULTURE RECEIVED NO GROWTH TO DATE CULTURE WILL BE HELD FOR 5 DAYS BEFORE ISSUING A FINAL NEGATIVE REPORT   Report Status PENDING   Incomplete  CULTURE, BLOOD (ROUTINE X 2)     Status: None   Collection Time    09/09/12  4:19 PM      Result Value Range Status   Specimen Description BLOOD LEFT FOREARM   Final   Special Requests BOTTLES DRAWN AEROBIC ONLY 2CC   Final   Culture  Setup Time 09/09/2012 22:48   Final   Culture     Final   Value:        BLOOD CULTURE RECEIVED NO GROWTH TO DATE CULTURE WILL BE HELD FOR 5 DAYS BEFORE ISSUING A FINAL NEGATIVE REPORT   Report Status PENDING  Incomplete     Studies: No results found.  Scheduled Meds: . cefTRIAXone (ROCEPHIN)  IV  2 g Intravenous Q24H  . diphenhydrAMINE  25 mg Intravenous Q12H  . enoxaparin (LOVENOX) injection  40 mg Subcutaneous Q24H  . furosemide  20 mg Oral Daily  . insulin aspart  0-9 Units Subcutaneous TID WC  .  sodium chloride  3 mL Intravenous Q12H   Continuous Infusions:    Principal Problem:   Fever Active Problems:   Diabetes mellitus   Obesity   Headache    Time spent:    [facility]  [facility] Pager [phone]. If 7PM-7AM, please contact night-coverage at [email or web address], password [facility] 09/10/2012, 2:23 PM  LOS: 4 days

## 2012-09-11 DIAGNOSIS — R509 Fever, unspecified: Secondary | ICD-10-CM

## 2012-09-11 DIAGNOSIS — R51 Headache: Secondary | ICD-10-CM

## 2012-09-11 DIAGNOSIS — E119 Type 2 diabetes mellitus without complications: Secondary | ICD-10-CM

## 2012-09-11 LAB — CBC
HCT: 34.6 % — ABNORMAL LOW (ref 36.0–46.0)
Hemoglobin: 11.1 g/dL — ABNORMAL LOW (ref 12.0–15.0)
MCH: 26.6 pg (ref 26.0–34.0)
MCHC: 32.1 g/dL (ref 30.0–36.0)
MCV: 82.8 fL (ref 78.0–100.0)
Platelets: 336 10*3/uL (ref 150–400)
RBC: 4.18 MIL/uL (ref 3.87–5.11)
RDW: 14.7 % (ref 11.5–15.5)
WBC: 10.3 10*3/uL (ref 4.0–10.5)

## 2012-09-11 LAB — BASIC METABOLIC PANEL
BUN: 7 mg/dL (ref 6–23)
CO2: 28 mEq/L (ref 19–32)
Calcium: 8.9 mg/dL (ref 8.4–10.5)
Chloride: 102 mEq/L (ref 96–112)
Creatinine, Ser: 0.68 mg/dL (ref 0.50–1.10)
GFR calc Af Amer: >90 mL/min (ref >90)
GFR calc non Af Amer: >90 mL/min (ref >90)
Glucose, Bld: 137 mg/dL — ABNORMAL HIGH (ref 70–99)
Potassium: 3.5 mEq/L (ref 3.5–5.1)
Sodium: 139 mEq/L (ref 135–145)

## 2012-09-11 LAB — HIV-1 RNA QUANT-NO REFLEX-BLD
HIV 1 RNA Quant: <20 copies/mL (ref <20)
HIV-1 RNA Quant, Log: <1.3 {Log} (ref <1.30)

## 2012-09-11 LAB — HSV(HERPES SMPLX VRS)ABS-I+II(IGG)-CSF

## 2012-09-11 LAB — GLUCOSE, CAPILLARY
Glucose-Capillary: 116 mg/dL — ABNORMAL HIGH (ref 70–99)
Glucose-Capillary: 184 mg/dL — ABNORMAL HIGH (ref 70–99)

## 2012-09-11 LAB — ROCKY MTN SPOTTED FVR AB, IGM-BLOOD: RMSF IgM: 0.1 IV (ref 0.00–0.89)

## 2012-09-11 MED ORDER — FUROSEMIDE 20 MG PO TABS: 20.0000 mg | ORAL_TABLET | Freq: Every day | ORAL | Status: DC

## 2012-09-11 MED ORDER — METFORMIN HCL 500 MG PO TABS: 500.0000 mg | ORAL_TABLET | Freq: Two times a day (BID) | ORAL | Status: DC

## 2012-09-11 MED ORDER — IBUPROFEN 400 MG PO TABS: 400.0000 mg | ORAL_TABLET | Freq: Three times a day (TID) | ORAL | Status: DC | PRN

## 2012-09-11 MED ORDER — IBUPROFEN 600 MG PO TABS
600.0000 mg | ORAL_TABLET | ORAL | Status: DC | PRN
  Administered 2012-09-11: 600 mg via ORAL
  Filled 2012-09-11: qty 1

## 2012-09-11 MED ORDER — SODIUM CHLORIDE 0.9 % IJ SOLN: 10.0000 mL | INTRAMUSCULAR | Status: DC | PRN

## 2012-09-11 MED ORDER — DEXTROSE 5 % IV SOLN: 2.0000 g | INTRAVENOUS | Status: DC

## 2012-09-11 MED ORDER — HEPARIN SOD (PORK) LOCK FLUSH 100 UNIT/ML IV SOLN
250.0000 [IU] | INTRAVENOUS | Status: AC | PRN
  Administered 2012-09-11: 250 [IU]

## 2012-09-11 NOTE — Discharge Summary (Signed)
Physician Discharge Summary  [name]:[id number] DOB: 04-06-1971 DOA: 09/06/2012  PCP: Herb Grays, MD  Admit date: 09/06/2012 Discharge date: 09/11/2012  Time spent: 50 minutes  Recommendations for Outpatient Follow-up:  1. PCP in 1 week 2. HH RN for IV abx via PICC 3. Remove PICC line after completing IV antibiotics  Discharge Diagnoses:    RLE cellulitis   Morbid Obesity   Diabetes mellitus   Post LP Headache   Chronic Lymphedema  Discharge Condition: Stable  Diet recommendation: DIabetic diet  Filed Weights   09/07/12 0334 09/08/12 0431 09/09/12 0400  Weight: 146.739 kg (323 lb 8 oz) 147.238 kg (324 lb 9.6 oz) 148.009 kg (326 lb 4.8 oz)    History of present illness:  [name] is a 42 y.o. female with known history of diabetes mellitus type 2 presently on no medications, history of pseudotumor cerebri, morbid obesity presented with complaints of fever and headache. In the ER CT head was negative for any acute. Chest x-ray and UA unremarkable. Blood cultures and urine cultures were sent. Patient denies any nausea vomiting abdominal pain diarrhea chest pain shortness of breath or any productive cough. Denies any focal deficits. Denies any insect bites or recent travel.  Hospital Course:  Few hours into her hospitalization she was noted to have redness, swelling, pain and tenderness in her RLE concerning for Cellulitis 1. RLE cellulitis -slowly improving  -initially treated with IV Vanc/Zosyn for 2 days then changed to IV rocephin 5/25 based on blood culture data. -WBC improving, fevers down  -dopplers negative  -advised leg elevation  -resumed Po lasix   2. Group B strep 1/2 bacteremia:  -Source felt to be cellulitis -Treated with Iv Vanc/Zosyn then changed to IV ceftriaxone, completed 5 days on IV Abx in hospital will need 8 more days. -Seen by ID in consultation -Will be discharged home after PICC line today  3.DM: hbaic 8.6  -stable, resume  metformin -life style modification emphasised  4. Post LP headache  - LP negative for meningitis -ibuprofen PRN  Consultations:  ID  Procedure: Lumbar pucnture  Discharge Exam: Filed Vitals:   09/10/12 1328 09/10/12 2100 09/11/12 0500 09/11/12 0926  BP: 121/85 130/77 138/86 119/71  Pulse: 93 83 75   Temp: 98.1 F (36.7 C) 98.4 F (36.9 C) 97.7 F (36.5 C)   TempSrc: Oral     Resp: 18 20 20    Height:      Weight:      SpO2: 93% 98% 97%     General: AAOx3 Cardiovascular:S1S2/RRR Respiratory: CTAB  Discharge Instructions     Medication List    TAKE these medications       acetaminophen 500 MG tablet  Commonly known as:  TYLENOL  Take 1,000 mg by mouth every 6 (six) hours as needed. For pain     dextrose 5 % SOLN 50 mL with cefTRIAXone 2 G SOLR 2 g  Inject 2 g into the vein daily.     furosemide 20 MG tablet  Commonly known as:  LASIX  Take 1 tablet (20 mg total) by mouth daily.     ibuprofen 400 MG tablet  Commonly known as:  ADVIL,MOTRIN  Take 1 tablet (400 mg total) by mouth 3 (three) times daily with meals as needed for pain (or headache).     metFORMIN 500 MG tablet  Commonly known as:  GLUCOPHAGE  Take 1 tablet (500 mg total) by mouth 2 (two) times daily with a meal.  omeprazole 20 MG tablet  Commonly known as:  PRILOSEC OTC  Take 20 mg by mouth daily.       Allergies  Allergen Reactions  . Vancomycin   . Adhesive (Tape) Itching and Rash       Follow-up Information   Follow up with Herb Grays, MD In 1 week.   Contact information:   [address] [place] [address] [phone]        The results of significant diagnostics from this hospitalization (including imaging, microbiology, ancillary and laboratory) are listed below for reference.    Significant Diagnostic Studies: Dg Chest 2 View  09/07/2012   *RADIOLOGY REPORT*  Clinical Data: Fever and chills.  CHEST - 2 VIEW  Comparison:  03/20/2010.  Findings: The cardiac silhouette, mediastinal and hilar contours are normal and stable.  There are mild chronic bronchitic type lung changes, likely related to smoking.  No infiltrates, edema or effusions.  The bony thorax is intact.  IMPRESSION:  1.  Chronic bronchitic type lung changes, likely related to smoking. 2.  No acute pulmonary findings.   Original Report Authenticated By: Rudie Meyer, M.D.   Ct Head Wo Contrast  09/07/2012   *RADIOLOGY REPORT*  Clinical Data: Fever, nausea and dizziness.  CT HEAD WITHOUT CONTRAST  Technique:  Contiguous axial images were obtained from the base of the skull through the vertex without contrast.  Comparison: None  Findings: The ventricles are normal.  No extra-axial fluid collections are seen.  The brainstem and cerebellum are unremarkable.  No acute intracranial findings such as infarction or hemorrhage.  No mass lesions.  The bony calvarium is intact.  The visualized paranasal sinuses and mastoid air cells are clear.  The parotid glands are enlarged bilaterally, right greater than left but are not completely imaged.  Recommend clinical correlation with any parotid area pain.  This may be a normal variant.  IMPRESSION:  1.  No acute intracranial findings or mass lesions. 2.  Enlarged parotid glands, right greater than left of uncertain significance.   Original Report Authenticated By: Rudie Meyer, M.D.   Dg Fluoro Guide Lumbar Puncture  09/07/2012   *RADIOLOGY REPORT*  Clinical Data:  Fever.  DIAGNOSTIC LUMBAR PUNCTURE UNDER FLUOROSCOPIC GUIDANCE  Fluoroscopy time:  7 seconds  Technique:  Time-out procedure was performed.  Informed consent was obtained from the patient prior to the procedure, including potential complications of headache, allergy, CSF leak and pain.  With the patient prone, the lower back was prepped with Betadine. 1% Lidocaine was used for local anesthesia.  Lumbar puncture was performed at the L2-L3 level on theright using a 5 inch 21  gauge needle with return of clear colorless CSF with an opening pressure of 32 cm water measured prone through the needle.   14 ml of CSF were obtained for laboratory studies.  The patient tolerated the procedure well and there were no apparent complications.  IMPRESSION: Diagnostic lumbar puncture performed without any complications.  Elevated opening pressure.   Original Report Authenticated By: Carey Bullocks, M.D.    Microbiology: Recent Results (from the past 240 hour(s))  CULTURE, BLOOD (ROUTINE X 2)     Status: None   Collection Time    09/07/12 12:35 AM      Result Value Range Status   Specimen Description BLOOD RIGHT ARM   Final   Special Requests BOTTLES DRAWN AEROBIC ONLY 10CC   Final   Culture  Setup Time 09/07/2012 05:00  Final   Culture     Final   Value: GROUP B STREP(S.AGALACTIAE)ISOLATED     Note: Gram Stain Report Called to,Read Back By and Verified With: MILDRED AT 0340 09/08/12 BY SNOLO   Report Status 09/10/2012 FINAL   Final   Organism ID, Bacteria GROUP B STREP(S.AGALACTIAE)ISOLATED   Final  CULTURE, BLOOD (ROUTINE X 2)     Status: None   Collection Time    09/07/12 12:39 AM      Result Value Range Status   Specimen Description BLOOD RIGHT HAND   Final   Special Requests BOTTLES DRAWN AEROBIC ONLY 10CC   Final   Culture  Setup Time 09/07/2012 05:00   Final   Culture     Final   Value:        BLOOD CULTURE RECEIVED NO GROWTH TO DATE CULTURE WILL BE HELD FOR 5 DAYS BEFORE ISSUING A FINAL NEGATIVE REPORT   Report Status PENDING   Incomplete  CSF CULTURE     Status: None   Collection Time    09/07/12  1:39 PM      Result Value Range Status   Specimen Description CSF   Final   Special Requests 2.3ML FLUID   Final   Gram Stain     Final   Value: CYTOSPIN NO WBC SEEN     NO ORGANISMS SEEN     Performed at [facility]   Culture NO GROWTH 3 DAYS   Final   Report Status 09/10/2012 FINAL   Final  GRAM STAIN     Status: None   Collection Time    09/07/12   1:39 PM      Result Value Range Status   Specimen Description CSF   Final   Special Requests 2.3ML FLUID   Final   Gram Stain     Final   Value: CYTOSPIN PREP     NO WBC SEEN     NO ORGANISMS SEEN   Report Status 09/07/2012 FINAL   Final  VIRAL CULTURE VIRC     Status: None   Collection Time    09/07/12  1:39 PM      Result Value Range Status   Specimen Description CSF   Final   Special Requests NONE   Final   Culture Culture has been initiated.   Final   Report Status PENDING   Incomplete  CULTURE, BLOOD (ROUTINE X 2)     Status: None   Collection Time    09/09/12  4:01 PM      Result Value Range Status   Specimen Description BLOOD RIGHT HAND   Final   Special Requests BOTTLES DRAWN AEROBIC ONLY 7CC   Final   Culture  Setup Time 09/09/2012 22:46   Final   Culture     Final   Value:        BLOOD CULTURE RECEIVED NO GROWTH TO DATE CULTURE WILL BE HELD FOR 5 DAYS BEFORE ISSUING A FINAL NEGATIVE REPORT   Report Status PENDING   Incomplete  CULTURE, BLOOD (ROUTINE X 2)     Status: None   Collection Time    09/09/12  4:19 PM      Result Value Range Status   Specimen Description BLOOD LEFT FOREARM   Final   Special Requests BOTTLES DRAWN AEROBIC ONLY 2CC   Final   Culture  Setup Time 09/09/2012 22:48   Final   Culture     Final   Value:  BLOOD CULTURE RECEIVED NO GROWTH TO DATE CULTURE WILL BE HELD FOR 5 DAYS BEFORE ISSUING A FINAL NEGATIVE REPORT   Report Status PENDING   Incomplete     Labs: Basic Metabolic Panel:  Recent Labs Lab 09/06/12 2145 09/07/12 0500 09/08/12 0615 09/10/12 0430 09/11/12 0455  NA 132* 135 136 137 139  K 4.0 3.8 3.9 3.4* 3.5  CL 97 101 104 101 102  CO2 23 20 21 27 28   GLUCOSE 206* 205* 165* 145* 137*  BUN 8 8 8 6 7   CREATININE 0.63 0.63 0.80 0.68 0.68  CALCIUM 9.4 8.7 8.5 9.0 8.9   Liver Function Tests:  Recent Labs Lab 09/06/12 2145 09/07/12 0500  AST 24 21  ALT 21 19  ALKPHOS 105 90  BILITOT 0.5 0.6  PROT 7.8 7.0  ALBUMIN  3.3* 2.8*   No results found for this basename: LIPASE, AMYLASE,  in the last 168 hours No results found for this basename: AMMONIA,  in the last 168 hours CBC:  Recent Labs Lab 09/06/12 2145 09/07/12 0500 09/08/12 0615 09/10/12 0430 09/11/12 0455  WBC 26.0* 34.5* 17.1* 12.0* 10.3  NEUTROABS  --  30.9*  --   --   --   HGB 13.3 12.5 11.5* 11.9* 11.1*  HCT 39.2 38.3 35.1* 36.8 34.6*  MCV 81.5 82.0 82.0 82.9 82.8  PLT 372 335 290 326 336   Cardiac Enzymes: No results found for this basename: CKTOTAL, CKMB, CKMBINDEX, TROPONINI,  in the last 168 hours BNP: BNP (last 3 results) No results found for this basename: PROBNP,  in the last 8760 hours CBG:  Recent Labs Lab 09/10/12 1202 09/10/12 1631 09/10/12 2028 09/11/12 0745 09/11/12 1131  GLUCAP 144* 131* 169* 116* 184*       Signed:  Joelys Staubs  [facility] 09/11/2012, 12:28 PM

## 2012-09-11 NOTE — Progress Notes (Signed)
INFECTIOUS DISEASE PROGRESS NOTE  ID: [name] is a 42 y.o. female with  Principal Problem:   Fever Active Problems:   Diabetes mellitus   Obesity   Headache  Subjective: C/o continued headache  Abtx:  Anti-infectives   Start     Dose/Rate Route Frequency Ordered Stop   09/09/12 1500  cefTRIAXone (ROCEPHIN) 2 g in dextrose 5 % 50 mL IVPB     2 g 100 mL/hr over 30 Minutes Intravenous Every 24 hours 09/09/12 1348     09/07/12 2000  vancomycin (VANCOCIN) 1,500 mg in sodium chloride 0.9 % 500 mL IVPB  Status:  Discontinued     1,500 mg 125 mL/hr over 240 Minutes Intravenous Every 12 hours 09/07/12 0735 09/09/12 1348   09/07/12 1900  piperacillin-tazobactam (ZOSYN) IVPB 3.375 g  Status:  Discontinued     3.375 g 12.5 mL/hr over 240 Minutes Intravenous 3 times per day 09/07/12 1741 09/09/12 1348   09/07/12 1800  doxycycline (VIBRAMYCIN) 100 mg in dextrose 5 % 250 mL IVPB  Status:  Discontinued     100 mg 125 mL/hr over 120 Minutes Intravenous Every 12 hours 09/07/12 0816 09/07/12 1654   09/07/12 1530  vancomycin (VANCOCIN) 1,500 mg in sodium chloride 0.9 % 500 mL IVPB  Status:  Discontinued     1,500 mg 125 mL/hr over 240 Minutes Intravenous Every 12 hours 09/07/12 0316 09/07/12 0735   09/07/12 1400  acyclovir (ZOVIRAX) 500 mg in dextrose 5 % 100 mL IVPB  Status:  Discontinued     500 mg 110 mL/hr over 60 Minutes Intravenous Every 8 hours 09/07/12 0735 09/07/12 0816   09/07/12 1300  acyclovir (ZOVIRAX) 500 mg in dextrose 5 % 100 mL IVPB  Status:  Discontinued     500 mg 110 mL/hr over 60 Minutes Intravenous Every 8 hours 09/07/12 0816 09/07/12 1654   09/07/12 1200  cefTRIAXone (ROCEPHIN) 2 g in dextrose 5 % 50 mL IVPB  Status:  Discontinued     2 g 100 mL/hr over 30 Minutes Intravenous Every 12 hours 09/07/12 0314 09/07/12 1654   09/07/12 0330  acyclovir (ZOVIRAX) 500 mg in dextrose 5 % 100 mL IVPB  Status:  Discontinued     500 mg 110 mL/hr over 60 Minutes  Intravenous Every 8 hours 09/07/12 0314 09/07/12 0735   09/07/12 0330  vancomycin (VANCOCIN) 2,500 mg in sodium chloride 0.9 % 500 mL IVPB     2,500 mg 125 mL/hr over 240 Minutes Intravenous  Once 09/07/12 0319 09/07/12 1300   09/07/12 0315  vancomycin (VANCOCIN) 2,500 mg in sodium chloride 0.9 % 500 mL IVPB  Status:  Discontinued     2,500 mg 250 mL/hr over 120 Minutes Intravenous  Once 09/07/12 0314 09/07/12 0320   09/07/12 0300  doxycycline (VIBRAMYCIN) 100 mg in dextrose 5 % 250 mL IVPB  Status:  Discontinued     100 mg 125 mL/hr over 120 Minutes Intravenous Every 12 hours 09/07/12 0254 09/07/12 0816   09/07/12 0200  doxycycline (VIBRA-TABS) tablet 100 mg     100 mg Oral  Once 09/07/12 0155 09/07/12 0204   09/06/12 2345  vancomycin (VANCOCIN) IVPB 1000 mg/200 mL premix  Status:  Discontinued     1,000 mg 200 mL/hr over 60 Minutes Intravenous  Once 09/06/12 2335 09/07/12 0047   09/06/12 2345  cefTRIAXone (ROCEPHIN) 2 g in dextrose 5 % 50 mL IVPB     2 g 100 mL/hr over 30 Minutes Intravenous  Once  09/06/12 2335 09/07/12 0146      Medications:  Scheduled: . cefTRIAXone (ROCEPHIN)  IV  2 g Intravenous Q24H  . diphenhydrAMINE  25 mg Intravenous Q12H  . enoxaparin (LOVENOX) injection  40 mg Subcutaneous Q24H  . furosemide  20 mg Oral Daily  . insulin aspart  0-9 Units Subcutaneous TID WC  . sodium chloride  3 mL Intravenous Q12H    Objective: Vital signs in last 24 hours: Temp:  [97.7 F (36.5 C)-98.4 F (36.9 C)] 97.7 F (36.5 C) (05/27 0500) Pulse Rate:  [75-93] 75 (05/27 0500) Resp:  [18-20] 20 (05/27 0500) BP: (119-138)/(71-86) 119/71 mmHg (05/27 0926) SpO2:  [93 %-98 %] 97 % (05/27 0500)   General appearance: alert, cooperative, no distress and morbidly obese Extremities: edematous and erythematous RLE. this has improved vs prior. there is minimal increase in heat.   Lab Results  Recent Labs  09/10/12 0430 09/11/12 0455  WBC 12.0* 10.3  HGB 11.9* 11.1*  HCT  36.8 34.6*  NA 137 139  K 3.4* 3.5  CL 101 102  CO2 27 28  BUN 6 7  CREATININE 0.68 0.68   Liver Panel No results found for this basename: PROT, ALBUMIN, AST, ALT, ALKPHOS, BILITOT, BILIDIR, IBILI,  in the last 72 hours Sedimentation Rate No results found for this basename: ESRSEDRATE,  in the last 72 hours C-Reactive Protein No results found for this basename: CRP,  in the last 72 hours  Microbiology: Recent Results (from the past 240 hour(s))  CULTURE, BLOOD (ROUTINE X 2)     Status: None   Collection Time    09/07/12 12:35 AM      Result Value Range Status   Specimen Description BLOOD RIGHT ARM   Final   Special Requests BOTTLES DRAWN AEROBIC ONLY 10CC   Final   Culture  Setup Time 09/07/2012 05:00   Final   Culture     Final   Value: GROUP B STREP(S.AGALACTIAE)ISOLATED     Note: Gram Stain Report Called to,Read Back By and Verified With: MILDRED AT 0340 09/08/12 BY SNOLO   Report Status 09/10/2012 FINAL   Final   Organism ID, Bacteria GROUP B STREP(S.AGALACTIAE)ISOLATED   Final  CULTURE, BLOOD (ROUTINE X 2)     Status: None   Collection Time    09/07/12 12:39 AM      Result Value Range Status   Specimen Description BLOOD RIGHT HAND   Final   Special Requests BOTTLES DRAWN AEROBIC ONLY 10CC   Final   Culture  Setup Time 09/07/2012 05:00   Final   Culture     Final   Value:        BLOOD CULTURE RECEIVED NO GROWTH TO DATE CULTURE WILL BE HELD FOR 5 DAYS BEFORE ISSUING A FINAL NEGATIVE REPORT   Report Status PENDING   Incomplete  CSF CULTURE     Status: None   Collection Time    09/07/12  1:39 PM      Result Value Range Status   Specimen Description CSF   Final   Special Requests 2.3ML FLUID   Final   Gram Stain     Final   Value: CYTOSPIN NO WBC SEEN     NO ORGANISMS SEEN     Performed at [facility]   Culture NO GROWTH 3 DAYS   Final   Report Status 09/10/2012 FINAL   Final  GRAM STAIN     Status: None   Collection Time  09/07/12  1:39 PM      Result  Value Range Status   Specimen Description CSF   Final   Special Requests 2.3ML FLUID   Final   Gram Stain     Final   Value: CYTOSPIN PREP     NO WBC SEEN     NO ORGANISMS SEEN   Report Status 09/07/2012 FINAL   Final  CULTURE, BLOOD (ROUTINE X 2)     Status: None   Collection Time    09/09/12  4:01 PM      Result Value Range Status   Specimen Description BLOOD RIGHT HAND   Final   Special Requests BOTTLES DRAWN AEROBIC ONLY 7CC   Final   Culture  Setup Time 09/09/2012 22:46   Final   Culture     Final   Value:        BLOOD CULTURE RECEIVED NO GROWTH TO DATE CULTURE WILL BE HELD FOR 5 DAYS BEFORE ISSUING A FINAL NEGATIVE REPORT   Report Status PENDING   Incomplete  CULTURE, BLOOD (ROUTINE X 2)     Status: None   Collection Time    09/09/12  4:19 PM      Result Value Range Status   Specimen Description BLOOD LEFT FOREARM   Final   Special Requests BOTTLES DRAWN AEROBIC ONLY 2CC   Final   Culture  Setup Time 09/09/2012 22:48   Final   Culture     Final   Value:        BLOOD CULTURE RECEIVED NO GROWTH TO DATE CULTURE WILL BE HELD FOR 5 DAYS BEFORE ISSUING A FINAL NEGATIVE REPORT   Report Status PENDING   Incomplete    Studies/Results: No results found.   Assessment/Plan: Cellulitis Bacteremia, GBS headache Would aim for 14 days of anbx (IV) available if questions, will see in clinic for f/u  Total days of antibiotics: 5 (ceftriaxone)  Johny Sax [facility] [phone] [email or web address] 09/11/2012, 10:00 AM   LOS: 5 days

## 2012-09-11 NOTE — Progress Notes (Signed)
Peripherally Inserted Central Catheter/Midline Placement  The IV Nurse has discussed with the patient and/or persons authorized to consent for the patient, the purpose of this procedure and the potential benefits and risks involved with this procedure.  The benefits include less needle sticks, lab draws from the catheter and patient may be discharged home with the catheter.  Risks include, but not limited to, infection, bleeding, blood clot (thrombus formation), and puncture of an artery; nerve damage and irregular heat beat.  Alternatives to this procedure were also discussed.  PICC/Midline Placement Documentation        Lisabeth Devoid 09/11/2012, 2:15 PM

## 2012-09-11 NOTE — Progress Notes (Signed)
Pt discharged to home with PICC line per MD order. Pt received daily dose of IV rocephin prior to discharge. Pt received and reviewed all discharge instructions and medication information.  Pt verbalized understanding. Pt alert and oriented at discharge with no complaints of pain.  Pt escorted to private vehicle via wheelchair by nurse tech. Efraim Kaufmann

## 2012-09-11 NOTE — Care Management Note (Signed)
    Page 1 of 1   09/11/2012     12:52:54 PM   CARE MANAGEMENT NOTE 09/11/2012  Patient:  [name], [name]   Account Number:  [id number]  Date Initiated:  09/11/2012  Documentation initiated by:  GRAVES-BIGELOW,Tri Chittick  Subjective/Objective Assessment:   Pt admitted for fever and headache. Needs Iv ABX therpay for home.     Action/Plan:   CM did make referral for hh services with [facility]. Pt to get Picc line today. Cm did make referral for services. SOC to beginin within 24 hours of d/c. No further needs from CM at this time.   Anticipated DC Date:  09/11/2012   Anticipated DC Plan:  HOME W HOME HEALTH SERVICES      DC Planning Services  CM consult  Medication Assistance      [facility] Choice  HOME HEALTH   Choice offered to / List presented to:  C-1 Patient        HH arranged  HH-1 RN  HH-10 DISEASE MANAGEMENT      HH agency  [facility]   Status of service:  Completed, signed off Medicare Important Message given?   (If response is "NO", the following Medicare IM given date fields will be blank) Date Medicare IM given:   Date Additional Medicare IM given:    Discharge Disposition:  HOME W HOME HEALTH SERVICES  Per UR Regulation:  Reviewed for med. necessity/level of care/duration of stay  If discussed at Long Length of Stay Meetings, dates discussed:    Comments:

## 2012-09-11 NOTE — Progress Notes (Signed)
PICC line capped off, flushed with 10cc NS, GBR; heparin 250units. Consuello Masse

## 2012-09-12 LAB — MISCELLANEOUS TEST

## 2012-09-13 LAB — VIRAL CULTURE VIRC: Culture: NOT DETECTED

## 2012-09-13 LAB — CULTURE, BLOOD (ROUTINE X 2): Culture: NO GROWTH

## 2012-09-15 LAB — CULTURE, BLOOD (ROUTINE X 2)
Culture: NO GROWTH
Culture: NO GROWTH

## 2012-09-20 ENCOUNTER — Telehealth: Payer: Self-pay | Admitting: Infectious Diseases

## 2012-09-20 NOTE — Telephone Encounter (Signed)
Pt have bleeding at pic site, some pain. Her temp is 99.3 today (99.7 yesterday).  Will pull her PIC now, have her seen in office tomorrow.  130/92    HR 104 Pt instructed if she has temp > 100.0 to go to urgent care or ED for evaluation.

## 2012-09-21 ENCOUNTER — Ambulatory Visit (HOSPITAL_COMMUNITY)
Admission: RE | Admit: 2012-09-21 | Discharge: 2012-09-21 | Disposition: A | Discharge status: Home / Self Care | Payer: Self-pay | Source: Ambulatory Visit | Attending: Infectious Diseases | Admitting: Infectious Diseases

## 2012-09-21 ENCOUNTER — Ambulatory Visit (INDEPENDENT_AMBULATORY_CARE_PROVIDER_SITE_OTHER): Payer: Self-pay | Admitting: Infectious Diseases

## 2012-09-21 ENCOUNTER — Encounter: Payer: Self-pay | Admitting: Infectious Diseases

## 2012-09-21 VITALS — BP 130/78 | HR 90 | Temp 97.7°F | Ht 61.0 in | Wt 319.0 lb

## 2012-09-21 DIAGNOSIS — E669 Obesity, unspecified: Secondary | ICD-10-CM | POA: Insufficient documentation

## 2012-09-21 DIAGNOSIS — L0390 Cellulitis, unspecified: Secondary | ICD-10-CM

## 2012-09-21 DIAGNOSIS — R609 Edema, unspecified: Secondary | ICD-10-CM

## 2012-09-21 DIAGNOSIS — L0291 Cutaneous abscess, unspecified: Secondary | ICD-10-CM

## 2012-09-21 MED ORDER — LEVOFLOXACIN 500 MG PO TABS: 500.0000 mg | ORAL_TABLET | Freq: Every day | ORAL | Status: DC

## 2012-09-21 NOTE — Assessment & Plan Note (Addendum)
Her cellulitis appears to be doing okay. She has significant lymphedema changes. We'll continue one more week of oral antibiotics. We'll see her back in 2 weeks. We'll recheck her blood cultures. We'll try to get her in to either vascular or the wound center to have compressive dressings applied to her legs (unsuccessful). Will have her use ace wrap for now, her legs are currently too large for stockings.Marland KitchenMarland KitchenMarland Kitchen

## 2012-09-21 NOTE — Progress Notes (Signed)
Right:  No evidence of DVT or superficial thrombosis.    

## 2012-09-21 NOTE — Assessment & Plan Note (Signed)
She is working to lose weight, watching her diet. I have encouraged her in the paradox of exercising and keeping her leg elevated.

## 2012-09-21 NOTE — Progress Notes (Signed)
  Subjective:    Patient ID: [name], female    DOB: 05-11-1970, 42 y.o.   MRN: [id number]  HPI 42 year old female with a history of morbid obesity and diabetes, admitted on May 22 with lower extremity cellulitis. She was found to have group B streptococcal bacteremia. On admission she also had fever and headache, she underwent a lumbar puncture which was negative. She was initially started on vancomycin and Zosyn, this was changed to ceftriaxone. She was d/c home on 5-27 on ceftriaxone via PIC.  (had 5 more doses as of 09-20-12).  I was called by her home health RN on (6-5)- Her temp was 99.3 (130/92 HR 104) but she was having bleeding yesterday at [facility] site.No d/c, there was some question that she had a cordis at site.  I asked the RN to pull her PIC.  Temp last pm was 100.6.  Still has erythema, more posteriorly in her LLE.   Review of Systems loose BM, non yeast infections. Has been able to ambulate, still some soreness in her LLE (causes her to limp). Trying to lose wt- eating less, drinking more water, no soft drinks.      Objective:   Physical Exam  Constitutional: She appears well-developed and well-nourished.  HENT:  Mouth/Throat: No oropharyngeal exudate.  Eyes: EOM are normal. Pupils are equal, round, and reactive to light.  Neck: Neck supple.  Cardiovascular: Normal rate, regular rhythm and normal heart sounds.   Pulmonary/Chest: Effort normal and breath sounds normal.  Abdominal: Soft. Bowel sounds are normal. She exhibits distension. There is no tenderness.  Musculoskeletal: She exhibits edema.       Arms:      Legs: Lymphadenopathy:    She has no cervical adenopathy.          Assessment & Plan:

## 2012-09-25 ENCOUNTER — Inpatient Hospital Stay: Payer: BC Managed Care – PPO | Admitting: Infectious Diseases

## 2012-09-28 LAB — CULTURE, BLOOD (SINGLE): Organism ID, Bacteria: NO GROWTH

## 2012-10-02 ENCOUNTER — Encounter: Payer: Self-pay | Admitting: Infectious Diseases

## 2012-10-02 ENCOUNTER — Ambulatory Visit (INDEPENDENT_AMBULATORY_CARE_PROVIDER_SITE_OTHER): Payer: Self-pay | Admitting: Infectious Diseases

## 2012-10-02 VITALS — BP 130/77 | HR 94 | Temp 98.2°F | Ht 61.0 in | Wt 327.0 lb

## 2012-10-02 DIAGNOSIS — L0291 Cutaneous abscess, unspecified: Secondary | ICD-10-CM

## 2012-10-02 DIAGNOSIS — E669 Obesity, unspecified: Secondary | ICD-10-CM

## 2012-10-02 DIAGNOSIS — L0390 Cellulitis, unspecified: Secondary | ICD-10-CM

## 2012-10-02 MED ORDER — PENICILLIN V POTASSIUM 500 MG PO TABS: 500.0000 mg | ORAL_TABLET | Freq: Every day | ORAL | Status: DC

## 2012-10-02 NOTE — Progress Notes (Signed)
  Subjective:    Patient ID: [name], female    DOB: 05/31/1970, 42 y.o.   MRN: [id number]  HPI 42 year old female with a history of morbid obesity and diabetes, admitted on May 22 with lower extremity cellulitis. She was found to have group B streptococcal bacteremia. On admission she also had fever and headache, she underwent a lumbar puncture which was negative. She was initially started on vancomycin and Zosyn, this was changed to ceftriaxone. She was d/c home on 5-27 on ceftriaxone via PIC. (had 5 more doses as of 09-20-12). She was had difficulty with her [facility] and was changed to Levaquin at her f/u 09-21-12.  She had doppler (-) for DVT after that appt.  Today her leg continues to be sore, has had "low grade fever" at night. 99.4-99.7.  No problems with levaquin (ran out on 6-12). Feels like she has had more swelling since.  No skin breakdown.    Review of Systems     Objective:   Physical Exam  Constitutional: She appears well-developed and well-nourished.  Musculoskeletal: She exhibits edema.       Legs:         Assessment & Plan:

## 2012-10-02 NOTE — Assessment & Plan Note (Addendum)
i do not believe that she has an infection right now. I have encouraged her to f/u with her PCP. She has been taking a diuretic since d/c from the hospital, even taking a double dose on her own yesterday. She is at risk for recurrence of cellulitis. Will start her on low dose PEN VK to see if we can prevent recurrent strep infection.

## 2012-10-02 NOTE — Assessment & Plan Note (Signed)
She is trying to loose wt.

## 2013-02-10 ENCOUNTER — Encounter (HOSPITAL_BASED_OUTPATIENT_CLINIC_OR_DEPARTMENT_OTHER): Payer: Self-pay | Admitting: Emergency Medicine

## 2013-02-10 ENCOUNTER — Emergency Department (HOSPITAL_BASED_OUTPATIENT_CLINIC_OR_DEPARTMENT_OTHER): Payer: No Typology Code available for payment source

## 2013-02-10 ENCOUNTER — Emergency Department (HOSPITAL_BASED_OUTPATIENT_CLINIC_OR_DEPARTMENT_OTHER)
Admission: EM | Admit: 2013-02-10 | Discharge: 2013-02-10 | Disposition: A | Discharge status: Home / Self Care | Payer: No Typology Code available for payment source | Attending: Emergency Medicine | Admitting: Emergency Medicine

## 2013-02-10 DIAGNOSIS — E119 Type 2 diabetes mellitus without complications: Secondary | ICD-10-CM | POA: Insufficient documentation

## 2013-02-10 DIAGNOSIS — J40 Bronchitis, not specified as acute or chronic: Secondary | ICD-10-CM | POA: Insufficient documentation

## 2013-02-10 DIAGNOSIS — Z87891 Personal history of nicotine dependence: Secondary | ICD-10-CM | POA: Insufficient documentation

## 2013-02-10 DIAGNOSIS — Z79899 Other long term (current) drug therapy: Secondary | ICD-10-CM | POA: Insufficient documentation

## 2013-02-10 DIAGNOSIS — K219 Gastro-esophageal reflux disease without esophagitis: Secondary | ICD-10-CM | POA: Insufficient documentation

## 2013-02-10 DIAGNOSIS — Z872 Personal history of diseases of the skin and subcutaneous tissue: Secondary | ICD-10-CM | POA: Insufficient documentation

## 2013-02-10 DIAGNOSIS — R51 Headache: Secondary | ICD-10-CM | POA: Insufficient documentation

## 2013-02-10 MED ORDER — ALBUTEROL SULFATE HFA 108 (90 BASE) MCG/ACT IN AERS
2.0000 | INHALATION_SPRAY | RESPIRATORY_TRACT | Status: DC | PRN
  Administered 2013-02-10: 2 via RESPIRATORY_TRACT
  Filled 2013-02-10: qty 6.7

## 2013-02-10 NOTE — ED Provider Notes (Signed)
CSN: [id number]     Arrival date & time 02/10/13  1547 History  This chart was scribed for [name] B. Bernette Mayers, MD by Leone Payor, ED Scribe. This patient was seen in room MH05/MH05 and the patient's care was started 3:58 PM.    Chief Complaint  Patient presents with  . Cough    The history is provided by the patient. No language interpreter was used.    HPI Comments: [name] is a 42 y.o. female who presents to the Emergency Department complaining of a constant, unchanged cough that began about 2 weeks ago. Pt states the cough is mostly dry but occasionally produces yellow/brown sputum. She has an associated fever around 99 and HA. She has tried mucinex and cough drops without relief. Pt is a current everyday smoker and occasional alcohol user.    Past Medical History  Diagnosis Date  . Diabetes mellitus   . GERD (gastroesophageal reflux disease)   . Cellulitis 07/2011   Past Surgical History  Procedure Laterality Date  . Cholecystectomy    . Knee arthroscopy    . Laparoscopic tubal ligation  04/29/2011    Procedure: LAPAROSCOPIC TUBAL LIGATION;  Surgeon: Turner Daniels, MD;  Location: [facility];  Service: Gynecology;  Laterality: Bilateral;  with Filshie Clips   No family history on file. History  Substance Use Topics  . Smoking status: Former Smoker -- 0.50 packs/day    Types: Cigarettes    Quit date: 07/22/2011  . Smokeless tobacco: Never Used  . Alcohol Use: Yes     Comment: occasional   OB History   Grav Para Term Preterm Abortions TAB SAB Ect Mult Living                 Review of Systems A complete 10 system review of systems was obtained and all systems are negative except as noted in the HPI and PMH.   Allergies  Vancomycin and Adhesive  Home Medications   Current Outpatient Rx  Name  Route  Sig  Dispense  Refill  . acetaminophen (TYLENOL) 500 MG tablet   Oral   Take 1,000 mg by mouth every 6 (six) hours as needed. For pain         . furosemide  (LASIX) 20 MG tablet   Oral   Take 1 tablet (20 mg total) by mouth daily.   30 tablet   0   . ibuprofen (ADVIL,MOTRIN) 400 MG tablet   Oral   Take 1 tablet (400 mg total) by mouth 3 (three) times daily with meals as needed for pain (or headache).   10 tablet   0   . metFORMIN (GLUCOPHAGE) 500 MG tablet   Oral   Take 1 tablet (500 mg total) by mouth 2 (two) times daily with a meal.   60 tablet   0   . omeprazole (PRILOSEC OTC) 20 MG tablet   Oral   Take 20 mg by mouth daily.         Marland Kitchen levofloxacin (LEVAQUIN) 500 MG tablet   Oral   Take 1 tablet (500 mg total) by mouth daily.   7 tablet   0   . penicillin v potassium (VEETID) 500 MG tablet   Oral   Take 1 tablet (500 mg total) by mouth daily.   90 tablet   3    BP 165/92  Pulse 102  Temp(Src) 98.1 F (36.7 C) (Oral)  Resp 20  Ht 5\' 1"  (1.549 m)  Wt  327 lb (148.326 kg)  BMI 61.82 kg/m2  SpO2 100% Physical Exam  Nursing note and vitals reviewed. Constitutional: She is oriented to person, place, and time. She appears well-developed and well-nourished.  HENT:  Head: Normocephalic and atraumatic.  Eyes: EOM are normal. Pupils are equal, round, and reactive to light.  Neck: Normal range of motion. Neck supple.  Cardiovascular: Normal rate, normal heart sounds and intact distal pulses.   Pulmonary/Chest: Effort normal and breath sounds normal. She has no wheezes. She has no rales.  Abdominal: Bowel sounds are normal. She exhibits no distension. There is no tenderness.  Musculoskeletal: Normal range of motion. She exhibits edema (chronic bilateral extremity edema). She exhibits no tenderness.  Neurological: She is alert and oriented to person, place, and time. She has normal strength. No cranial nerve deficit or sensory deficit.  Skin: Skin is warm and dry. No rash noted.  Psychiatric: She has a normal mood and affect.    ED Course  Procedures   DIAGNOSTIC STUDIES: Oxygen Saturation is 100% on RA, normal by my  interpretation.    COORDINATION OF CARE: 3:58 PM  Will order CXR and a breathing treatment. Discussed treatment plan with pt at bedside and pt agreed to plan.   4:33 PM Pt updated on radiology results.   Labs Review Labs Reviewed - No data to display Imaging Review Dg Chest 2 View  02/10/2013   CLINICAL DATA:  Cough, fever.  EXAM: CHEST  2 VIEW  COMPARISON:  January 25, 2013.  FINDINGS: The heart size and mediastinal contours are within normal limits. Both lungs are clear. The visualized skeletal structures are unremarkable.  IMPRESSION: No active cardiopulmonary disease.   Electronically Signed   By: Roque Lias M.D.   On: 02/10/2013 16:12    EKG Interpretation   None       MDM   1. Bronchitis     CXR neg, no concern for serious infection, likely a viral bronchitis. Symptoms improved with inhaler. She has cough syrup at home. Avoid prednisone due to diabetes. Advised expected course may last several more weeks.  Advised PCP followup.   I personally performed the services described in this documentation, which was scribed in my presence. The recorded information has been reviewed and is accurate.      [name] B. Bernette Mayers, MD 02/10/13 [phone]

## 2013-02-10 NOTE — ED Notes (Signed)
Cough x 2 weeks with low grade fever

## 2013-03-11 ENCOUNTER — Emergency Department (HOSPITAL_BASED_OUTPATIENT_CLINIC_OR_DEPARTMENT_OTHER)
Admission: EM | Admit: 2013-03-11 | Discharge: 2013-03-11 | Disposition: A | Discharge status: Home / Self Care | Payer: No Typology Code available for payment source | Attending: Emergency Medicine | Admitting: Emergency Medicine

## 2013-03-11 DIAGNOSIS — R220 Localized swelling, mass and lump, head: Secondary | ICD-10-CM

## 2013-03-11 DIAGNOSIS — E119 Type 2 diabetes mellitus without complications: Secondary | ICD-10-CM | POA: Insufficient documentation

## 2013-03-11 DIAGNOSIS — Y9389 Activity, other specified: Secondary | ICD-10-CM | POA: Insufficient documentation

## 2013-03-11 DIAGNOSIS — Z872 Personal history of diseases of the skin and subcutaneous tissue: Secondary | ICD-10-CM | POA: Insufficient documentation

## 2013-03-11 DIAGNOSIS — Z87891 Personal history of nicotine dependence: Secondary | ICD-10-CM | POA: Insufficient documentation

## 2013-03-11 DIAGNOSIS — S0993XA Unspecified injury of face, initial encounter: Secondary | ICD-10-CM | POA: Insufficient documentation

## 2013-03-11 DIAGNOSIS — Y929 Unspecified place or not applicable: Secondary | ICD-10-CM | POA: Insufficient documentation

## 2013-03-11 DIAGNOSIS — X500XXA Overexertion from strenuous movement or load, initial encounter: Secondary | ICD-10-CM | POA: Insufficient documentation

## 2013-03-11 DIAGNOSIS — Z79899 Other long term (current) drug therapy: Secondary | ICD-10-CM | POA: Insufficient documentation

## 2013-03-11 DIAGNOSIS — K219 Gastro-esophageal reflux disease without esophagitis: Secondary | ICD-10-CM | POA: Insufficient documentation

## 2013-03-11 MED ORDER — ONDANSETRON 4 MG PO TBDP
4.0000 mg | ORAL_TABLET | Freq: Once | ORAL | Status: AC
  Administered 2013-03-11: 4 mg via ORAL
  Filled 2013-03-11: qty 1

## 2013-03-11 MED ORDER — CEPHALEXIN 500 MG PO CAPS: 500.0000 mg | ORAL_CAPSULE | Freq: Four times a day (QID) | ORAL | Status: DC

## 2013-03-11 MED ORDER — HYDROCODONE-ACETAMINOPHEN 5-325 MG PO TABS: 2.0000 | ORAL_TABLET | ORAL | Status: DC | PRN

## 2013-03-11 NOTE — ED Notes (Signed)
States yawned last Saturday had cramp in neck since then Nausea onset Saturday,  Low grade fever onset yesterday and head ache onset to day,  Denies vomiting,

## 2013-03-11 NOTE — ED Notes (Signed)
Pt. Has multiple complaints she goes from yawning the other day with reports of the R side jaw popping to my throat is sore and then to I have a fever and then to Nausea when I look up and down.  Pt. Moves well and moves head R to L WNL .  No drooling noted and Pt. Talks non stop.

## 2013-03-11 NOTE — ED Provider Notes (Signed)
CSN: [id number]     Arrival date & time 03/11/13  2125 History   First MD Initiated Contact with Patient 03/11/13 2256     Chief Complaint  Patient presents with  . Nausea   (Consider location/radiation/quality/duration/timing/severity/associated sxs/prior Treatment) Patient is a 42 y.o. female presenting with facial injury. The history is provided by the patient. No language interpreter was used.  Facial Injury Location:  Face Pain details:    Quality:  Aching   Severity:  Moderate   Duration:  3 days   Timing:  Constant   Progression:  Worsening Chronicity:  New Foreign body present:  No foreign bodies Relieved by:  Nothing Worsened by:  Nothing tried Ineffective treatments:  None tried Associated symptoms: neck pain   Risk factors: no alcohol use   Pt reports she had a pop in right jaw and face area 3 days ago.  Pt reports she has had continued pain since  Past Medical History  Diagnosis Date  . Diabetes mellitus   . GERD (gastroesophageal reflux disease)   . Cellulitis 07/2011   Past Surgical History  Procedure Laterality Date  . Cholecystectomy    . Knee arthroscopy    . Laparoscopic tubal ligation  04/29/2011    Procedure: LAPAROSCOPIC TUBAL LIGATION;  Surgeon: Turner Daniels, MD;  Location: [facility];  Service: Gynecology;  Laterality: Bilateral;  with Filshie Clips   No family history on file. History  Substance Use Topics  . Smoking status: Former Smoker -- 0.50 packs/day    Types: Cigarettes    Quit date: 07/22/2011  . Smokeless tobacco: Never Used  . Alcohol Use: Yes     Comment: occasional   OB History   Grav Para Term Preterm Abortions TAB SAB Ect Mult Living                 Review of Systems  Musculoskeletal: Positive for neck pain.  All other systems reviewed and are negative.    Allergies  Vancomycin and Adhesive  Home Medications   Current Outpatient Rx  Name  Route  Sig  Dispense  Refill  . acetaminophen (TYLENOL) 500 MG tablet   Oral  Take 1,000 mg by mouth every 6 (six) hours as needed. For pain         . furosemide (LASIX) 20 MG tablet   Oral   Take 1 tablet (20 mg total) by mouth daily.   30 tablet   0   . ibuprofen (ADVIL,MOTRIN) 400 MG tablet   Oral   Take 1 tablet (400 mg total) by mouth 3 (three) times daily with meals as needed for pain (or headache).   10 tablet   0   . metFORMIN (GLUCOPHAGE) 500 MG tablet   Oral   Take 1 tablet (500 mg total) by mouth 2 (two) times daily with a meal.   60 tablet   0   . omeprazole (PRILOSEC OTC) 20 MG tablet   Oral   Take 20 mg by mouth daily.          BP 130/114  Pulse 107  Temp(Src) 98.6 F (37 C) (Oral)  Resp 18  Ht 5\' 1"  (1.549 m)  Wt 327 lb (148.326 kg)  BMI 61.82 kg/m2  SpO2 96% Physical Exam  Nursing note and vitals reviewed. Constitutional: She appears well-developed and well-nourished.  HENT:  Head: Normocephalic and atraumatic.  Right Ear: External ear normal.  Left Ear: External ear normal.  Nose: Nose normal.  Mouth/Throat: Oropharynx is  clear and moist.  Slight swelling right face,  Mouth dilated salivary duct right side,    Eyes: Pupils are equal, round, and reactive to light.  Neck: Normal range of motion.  Cardiovascular: Normal rate.   Pulmonary/Chest: Effort normal.  Musculoskeletal: Normal range of motion.  Neurological: She is alert.  Skin: Skin is warm.  Psychiatric: She has a normal mood and affect.    ED Course  Procedures (including critical care time) Labs Review Labs Reviewed - No data to display Imaging Review No results found.  EKG Interpretation   None       MDM   1. Right facial swelling     I suspect salivary stone.  Pt advised lemon drops  Amoxicillian, hydrocodone.   Pt advised to see Dr. Yehuda Budd for recheck on Wednesday.   Lonia Skinner [place], PA-C 03/11/13 2324

## 2013-03-12 NOTE — ED Provider Notes (Signed)
Medical screening examination/treatment/procedure(s) were performed by non-physician practitioner and as supervising physician I was immediately available for consultation/collaboration.  EKG Interpretation   None         Candyce Churn, MD 03/12/13 1238

## 2013-04-02 ENCOUNTER — Emergency Department (HOSPITAL_BASED_OUTPATIENT_CLINIC_OR_DEPARTMENT_OTHER): Payer: No Typology Code available for payment source

## 2013-04-02 ENCOUNTER — Encounter (HOSPITAL_BASED_OUTPATIENT_CLINIC_OR_DEPARTMENT_OTHER): Payer: Self-pay | Admitting: Emergency Medicine

## 2013-04-02 ENCOUNTER — Emergency Department (HOSPITAL_BASED_OUTPATIENT_CLINIC_OR_DEPARTMENT_OTHER)
Admission: EM | Admit: 2013-04-02 | Discharge: 2013-04-02 | Disposition: A | Discharge status: Home / Self Care | Payer: No Typology Code available for payment source | Attending: Emergency Medicine | Admitting: Emergency Medicine

## 2013-04-02 DIAGNOSIS — Z792 Long term (current) use of antibiotics: Secondary | ICD-10-CM | POA: Insufficient documentation

## 2013-04-02 DIAGNOSIS — E119 Type 2 diabetes mellitus without complications: Secondary | ICD-10-CM | POA: Insufficient documentation

## 2013-04-02 DIAGNOSIS — Z791 Long term (current) use of non-steroidal anti-inflammatories (NSAID): Secondary | ICD-10-CM | POA: Insufficient documentation

## 2013-04-02 DIAGNOSIS — K219 Gastro-esophageal reflux disease without esophagitis: Secondary | ICD-10-CM | POA: Insufficient documentation

## 2013-04-02 DIAGNOSIS — Z872 Personal history of diseases of the skin and subcutaneous tissue: Secondary | ICD-10-CM | POA: Insufficient documentation

## 2013-04-02 DIAGNOSIS — R110 Nausea: Secondary | ICD-10-CM | POA: Insufficient documentation

## 2013-04-02 DIAGNOSIS — Z9089 Acquired absence of other organs: Secondary | ICD-10-CM | POA: Insufficient documentation

## 2013-04-02 DIAGNOSIS — R109 Unspecified abdominal pain: Secondary | ICD-10-CM | POA: Insufficient documentation

## 2013-04-02 DIAGNOSIS — Z79899 Other long term (current) drug therapy: Secondary | ICD-10-CM | POA: Insufficient documentation

## 2013-04-02 DIAGNOSIS — R197 Diarrhea, unspecified: Secondary | ICD-10-CM | POA: Insufficient documentation

## 2013-04-02 LAB — COMPREHENSIVE METABOLIC PANEL
ALT: 19 U/L (ref 0–35)
AST: 15 U/L (ref 0–37)
Albumin: 3.3 g/dL — ABNORMAL LOW (ref 3.5–5.2)
Alkaline Phosphatase: 117 U/L (ref 39–117)
BUN: 9 mg/dL (ref 6–23)
CO2: 24 mEq/L (ref 19–32)
Calcium: 9.5 mg/dL (ref 8.4–10.5)
Chloride: 98 mEq/L (ref 96–112)
Creatinine, Ser: 0.6 mg/dL (ref 0.50–1.10)
GFR calc Af Amer: >90 mL/min (ref >90)
GFR calc non Af Amer: >90 mL/min (ref >90)
Glucose, Bld: 316 mg/dL — ABNORMAL HIGH (ref 70–99)
Potassium: 3.7 mEq/L (ref 3.5–5.1)
Sodium: 134 mEq/L — ABNORMAL LOW (ref 135–145)
Total Bilirubin: 0.4 mg/dL (ref 0.3–1.2)
Total Protein: 7.8 g/dL (ref 6.0–8.3)

## 2013-04-02 LAB — URINALYSIS, ROUTINE W REFLEX MICROSCOPIC
Bilirubin Urine: NEGATIVE
Glucose, UA: >1000 mg/dL — AB
Ketones, ur: 15 mg/dL — AB
Nitrite: NEGATIVE
Protein, ur: NEGATIVE mg/dL
Specific Gravity, Urine: 1.042 — ABNORMAL HIGH (ref 1.005–1.030)
Urobilinogen, UA: 0.2 mg/dL (ref 0.0–1.0)
pH: 5 (ref 5.0–8.0)

## 2013-04-02 LAB — CBC
HCT: 41 % (ref 36.0–46.0)
Hemoglobin: 13.7 g/dL (ref 12.0–15.0)
MCH: 27.1 pg (ref 26.0–34.0)
MCHC: 33.4 g/dL (ref 30.0–36.0)
MCV: 81 fL (ref 78.0–100.0)
Platelets: 365 10*3/uL (ref 150–400)
RBC: 5.06 MIL/uL (ref 3.87–5.11)
RDW: 14 % (ref 11.5–15.5)
WBC: 13.1 10*3/uL — ABNORMAL HIGH (ref 4.0–10.5)

## 2013-04-02 LAB — URINE MICROSCOPIC-ADD ON

## 2013-04-02 MED ORDER — HYDROCODONE-ACETAMINOPHEN 5-325 MG PO TABS: 2.0000 | ORAL_TABLET | ORAL | Status: DC | PRN

## 2013-04-02 MED ORDER — SODIUM CHLORIDE 0.9 % IV BOLUS (SEPSIS)
1000.0000 mL | Freq: Once | INTRAVENOUS | Status: AC
  Administered 2013-04-02: 1000 mL via INTRAVENOUS

## 2013-04-02 MED ORDER — MORPHINE SULFATE 4 MG/ML IJ SOLN
4.0000 mg | Freq: Once | INTRAMUSCULAR | Status: AC
  Administered 2013-04-02: 4 mg via INTRAVENOUS
  Filled 2013-04-02: qty 1

## 2013-04-02 MED ORDER — ONDANSETRON 8 MG PO TBDP: ORAL_TABLET | ORAL | Status: DC

## 2013-04-02 MED ORDER — ONDANSETRON HCL 4 MG/2ML IJ SOLN
4.0000 mg | Freq: Once | INTRAMUSCULAR | Status: AC
  Administered 2013-04-02: 4 mg via INTRAVENOUS
  Filled 2013-04-02: qty 2

## 2013-04-02 NOTE — ED Notes (Signed)
C/o right sided abd pain since 6pm, nausea but no vomiting or fever.

## 2013-04-02 NOTE — ED Provider Notes (Signed)
CSN: [id number]     Arrival date & time 04/02/13  2147 History  This chart was scribed for Rolan Bucco, MD by Landis Gandy, ED Scribe. This patient was seen in room MH05/MH05 and the patient's care was started at 10:22 PM  Chief Complaint  Patient presents with  . Abdominal Pain   The history is provided by the patient. No language interpreter was used.   HPI Comments: [name] is a 42 y.o. female who presents to the Emergency Department complaining of constant sharp right abdominal pain, onset suddenly about three hours ago. It radiates to her back and downward and is worsened with movements. She reports that her associated symptoms are nausea and diarrhea. She also states that she has had this pain before, but not this severe. Last time, she was diagnosed with a muscle strain. She has a history of a cholecystectomy. She denies any emesis and dysuria.   Past Medical History  Diagnosis Date  . Diabetes mellitus   . GERD (gastroesophageal reflux disease)   . Cellulitis 07/2011   Past Surgical History  Procedure Laterality Date  . Cholecystectomy    . Knee arthroscopy    . Laparoscopic tubal ligation  04/29/2011    Procedure: LAPAROSCOPIC TUBAL LIGATION;  Surgeon: Turner Daniels, MD;  Location: [facility];  Service: Gynecology;  Laterality: Bilateral;  with Filshie Clips   History reviewed. No pertinent family history. History  Substance Use Topics  . Smoking status: Former Smoker -- 0.50 packs/day    Types: Cigarettes    Quit date: 07/22/2011  . Smokeless tobacco: Never Used  . Alcohol Use: Yes     Comment: occasional   OB History   Grav Para Term Preterm Abortions TAB SAB Ect Mult Living                 Review of Systems  Constitutional: Negative for fever, chills, diaphoresis and fatigue.  HENT: Negative for congestion, rhinorrhea and sneezing.   Eyes: Negative.   Respiratory: Negative for cough, chest tightness and shortness of breath.   Cardiovascular: Negative  for chest pain and leg swelling.  Gastrointestinal: Positive for nausea, abdominal pain (right side) and diarrhea. Negative for vomiting and blood in stool.  Genitourinary: Negative for dysuria, frequency, hematuria, flank pain and difficulty urinating.  Musculoskeletal: Negative for arthralgias and back pain.  Skin: Negative for rash.  Neurological: Negative for dizziness, speech difficulty, weakness, numbness and headaches.    Allergies  Vancomycin and Adhesive  Home Medications   Current Outpatient Rx  Name  Route  Sig  Dispense  Refill  . furosemide (LASIX) 20 MG tablet   Oral   Take 1 tablet (20 mg total) by mouth daily.   30 tablet   0   . ibuprofen (ADVIL,MOTRIN) 400 MG tablet   Oral   Take 1 tablet (400 mg total) by mouth 3 (three) times daily with meals as needed for pain (or headache).   10 tablet   0   . metFORMIN (GLUCOPHAGE) 500 MG tablet   Oral   Take 1 tablet (500 mg total) by mouth 2 (two) times daily with a meal.   60 tablet   0   . omeprazole (PRILOSEC OTC) 20 MG tablet   Oral   Take 20 mg by mouth daily.         Marland Kitchen acetaminophen (TYLENOL) 500 MG tablet   Oral   Take 1,000 mg by mouth every 6 (six) hours as needed. For pain         .  cephALEXin (KEFLEX) 500 MG capsule   Oral   Take 1 capsule (500 mg total) by mouth 4 (four) times daily.   40 capsule   0   . HYDROcodone-acetaminophen (NORCO/VICODIN) 5-325 MG per tablet   Oral   Take 2 tablets by mouth every 4 (four) hours as needed.   10 tablet   0   . HYDROcodone-acetaminophen (NORCO/VICODIN) 5-325 MG per tablet   Oral   Take 2 tablets by mouth every 4 (four) hours as needed.   10 tablet   0   . ondansetron (ZOFRAN ODT) 8 MG disintegrating tablet      8mg  ODT q4 hours prn nausea   4 tablet   0    Triage Vitals: BP 123/66  Pulse 114  Temp(Src) 97.8 F (36.6 C) (Oral)  Resp 18  Ht 5\' 1"  (1.549 m)  Wt 327 lb (148.326 kg)  BMI 61.82 kg/m2  SpO2 100% Physical Exam   Constitutional: She is oriented to person, place, and time. She appears well-developed and well-nourished.  Patient is morbidly obese  HENT:  Head: Normocephalic and atraumatic.  Eyes: Pupils are equal, round, and reactive to light.  Neck: Normal range of motion. Neck supple.  Cardiovascular: Normal rate, regular rhythm and normal heart sounds.   Pulmonary/Chest: Effort normal and breath sounds normal. No respiratory distress. She has no wheezes. She has no rales. She exhibits no tenderness.  Abdominal: Soft. Bowel sounds are normal. There is tenderness (Mild tenderness to the right midabdomen. No CVA tenderness). There is no rebound and no guarding.  Musculoskeletal: Normal range of motion. She exhibits no edema.  Lymphadenopathy:    She has no cervical adenopathy.  Neurological: She is alert and oriented to person, place, and time.  Skin: Skin is warm and dry. No rash noted.  Psychiatric: She has a normal mood and affect.    ED Course  Procedures (including critical care time) DIAGNOSTIC STUDIES: Oxygen Saturation is 100% on RA, normal by my interpretation.    COORDINATION OF CARE: 10:25 PM- Discussed plans to order diagnostic labs and imaging. Will order medications. Pt advised of plan for treatment and pt agrees.  Labs Review Results for orders placed during the hospital encounter of 04/02/13  URINALYSIS, ROUTINE W REFLEX MICROSCOPIC      Result Value Range   Color, Urine YELLOW  YELLOW   APPearance CLOUDY (*) CLEAR   Specific Gravity, Urine 1.042 (*) 1.005 - 1.030   pH 5.0  5.0 - 8.0   Glucose, UA >1000 (*) NEGATIVE mg/dL   Hgb urine dipstick TRACE (*) NEGATIVE   Bilirubin Urine NEGATIVE  NEGATIVE   Ketones, ur 15 (*) NEGATIVE mg/dL   Protein, ur NEGATIVE  NEGATIVE mg/dL   Urobilinogen, UA 0.2  0.0 - 1.0 mg/dL   Nitrite NEGATIVE  NEGATIVE   Leukocytes, UA SMALL (*) NEGATIVE  CBC      Result Value Range   WBC 13.1 (*) 4.0 - 10.5 K/uL   RBC 5.06  3.87 - 5.11 MIL/uL    Hemoglobin 13.7  12.0 - 15.0 g/dL   HCT 16.1  09.6 - 04.5 %   MCV 81.0  78.0 - 100.0 fL   MCH 27.1  26.0 - 34.0 pg   MCHC 33.4  30.0 - 36.0 g/dL   RDW 40.9  81.1 - 91.4 %   Platelets 365  150 - 400 K/uL  COMPREHENSIVE METABOLIC PANEL      Result Value Range   Sodium 134 (*) 135 -  145 mEq/L   Potassium 3.7  3.5 - 5.1 mEq/L   Chloride 98  96 - 112 mEq/L   CO2 24  19 - 32 mEq/L   Glucose, Bld 316 (*) 70 - 99 mg/dL   BUN 9  6 - 23 mg/dL   Creatinine, Ser 4.54  0.50 - 1.10 mg/dL   Calcium 9.5  8.4 - 09.8 mg/dL   Total Protein 7.8  6.0 - 8.3 g/dL   Albumin 3.3 (*) 3.5 - 5.2 g/dL   AST 15  0 - 37 U/L   ALT 19  0 - 35 U/L   Alkaline Phosphatase 117  39 - 117 U/L   Total Bilirubin 0.4  0.3 - 1.2 mg/dL   GFR calc non Af Amer >90  >90 mL/min   GFR calc Af Amer >90  >90 mL/min  URINE MICROSCOPIC-ADD ON      Result Value Range   Squamous Epithelial / LPF FEW (*) RARE   WBC, UA 7-10  <3 WBC/hpf   RBC / HPF 0-2  <3 RBC/hpf   Bacteria, UA FEW (*) RARE   Urine-Other FEW YEAST     No results found.   Imaging Review Dg Abd Acute W/chest  04/02/2013   CLINICAL DATA:  Right parotid abdominal pain.  EXAM: ACUTE ABDOMEN SERIES (ABDOMEN 2 VIEW & CHEST 1 VIEW)  COMPARISON:  02/10/2013 chest x-ray  FINDINGS: There is no evidence of dilated bowel loops or free intraperitoneal air. No radiopaque calculi or other significant radiographic abnormality is seen. Cholecystectomy and tubal ligation. Heart size and mediastinal contours are within normal limits. Both lungs are clear.  IMPRESSION: Negative abdominal radiographs.  No acute cardiopulmonary disease.   Electronically Signed   By: Tiburcio Pea M.D.   On: 04/02/2013 23:22    EKG Interpretation   None       MDM   1. Abdominal  pain, other specified site    Patient with right-sided mid abdominal pain. She has no new CVA tenderness or other suggestions of renal colic. She has no evidence of bowel distraction. She status post cholecystectomy.  There is no pain over McBurney's point. She has no vomiting or fevers. Given this with a normal white count. I do not feel that CT imaging was indicated currently. I advised her followup with her primary care physician in 2 days for recheck or return here as needed she has any worsening pain vomiting or fevers.   I personally performed the services described in this documentation, which was scribed in my presence.  The recorded information has been reviewed and considered.      Rolan Bucco, MD 04/02/13 [phone]

## 2013-04-04 LAB — URINE CULTURE: Colony Count: >=100000

## 2013-04-05 ENCOUNTER — Encounter (HOSPITAL_BASED_OUTPATIENT_CLINIC_OR_DEPARTMENT_OTHER): Payer: Self-pay | Admitting: Emergency Medicine

## 2013-04-05 ENCOUNTER — Inpatient Hospital Stay (HOSPITAL_BASED_OUTPATIENT_CLINIC_OR_DEPARTMENT_OTHER)
Admission: EM | Admit: 2013-04-05 | Discharge: 2013-04-08 | DRG: 690 | Disposition: A | Discharge status: Home / Self Care | Payer: Self-pay | Attending: Internal Medicine | Admitting: Internal Medicine

## 2013-04-05 ENCOUNTER — Emergency Department (HOSPITAL_BASED_OUTPATIENT_CLINIC_OR_DEPARTMENT_OTHER): Payer: No Typology Code available for payment source

## 2013-04-05 DIAGNOSIS — Z833 Family history of diabetes mellitus: Secondary | ICD-10-CM

## 2013-04-05 DIAGNOSIS — E669 Obesity, unspecified: Secondary | ICD-10-CM | POA: Diagnosis present

## 2013-04-05 DIAGNOSIS — R509 Fever, unspecified: Secondary | ICD-10-CM | POA: Diagnosis present

## 2013-04-05 DIAGNOSIS — K219 Gastro-esophageal reflux disease without esophagitis: Secondary | ICD-10-CM | POA: Diagnosis present

## 2013-04-05 DIAGNOSIS — L0390 Cellulitis, unspecified: Secondary | ICD-10-CM

## 2013-04-05 DIAGNOSIS — Z881 Allergy status to other antibiotic agents status: Secondary | ICD-10-CM

## 2013-04-05 DIAGNOSIS — IMO0001 Reserved for inherently not codable concepts without codable children: Secondary | ICD-10-CM | POA: Diagnosis present

## 2013-04-05 DIAGNOSIS — Z87891 Personal history of nicotine dependence: Secondary | ICD-10-CM

## 2013-04-05 DIAGNOSIS — R51 Headache: Secondary | ICD-10-CM

## 2013-04-05 DIAGNOSIS — R1031 Right lower quadrant pain: Secondary | ICD-10-CM | POA: Diagnosis present

## 2013-04-05 DIAGNOSIS — Z9109 Other allergy status, other than to drugs and biological substances: Secondary | ICD-10-CM

## 2013-04-05 DIAGNOSIS — R609 Edema, unspecified: Secondary | ICD-10-CM

## 2013-04-05 DIAGNOSIS — Z6841 Body Mass Index (BMI) 40.0 and over, adult: Secondary | ICD-10-CM | POA: Diagnosis present

## 2013-04-05 DIAGNOSIS — Z79899 Other long term (current) drug therapy: Secondary | ICD-10-CM

## 2013-04-05 DIAGNOSIS — Z803 Family history of malignant neoplasm of breast: Secondary | ICD-10-CM

## 2013-04-05 DIAGNOSIS — N12 Tubulo-interstitial nephritis, not specified as acute or chronic: Secondary | ICD-10-CM | POA: Diagnosis present

## 2013-04-05 DIAGNOSIS — E119 Type 2 diabetes mellitus without complications: Secondary | ICD-10-CM

## 2013-04-05 DIAGNOSIS — N10 Acute tubulo-interstitial nephritis: Principal | ICD-10-CM | POA: Diagnosis present

## 2013-04-05 LAB — COMPREHENSIVE METABOLIC PANEL
ALT: 17 U/L (ref 0–35)
AST: 17 U/L (ref 0–37)
Albumin: 3.3 g/dL — ABNORMAL LOW (ref 3.5–5.2)
Alkaline Phosphatase: 112 U/L (ref 39–117)
BUN: 7 mg/dL (ref 6–23)
CO2: 27 mEq/L (ref 19–32)
Calcium: 9.1 mg/dL (ref 8.4–10.5)
Chloride: 97 mEq/L (ref 96–112)
Creatinine, Ser: 0.7 mg/dL (ref 0.50–1.10)
GFR calc Af Amer: >90 mL/min (ref >90)
GFR calc non Af Amer: >90 mL/min (ref >90)
Glucose, Bld: 364 mg/dL — ABNORMAL HIGH (ref 70–99)
Potassium: 3.7 mEq/L (ref 3.5–5.1)
Sodium: 136 mEq/L (ref 135–145)
Total Bilirubin: 0.5 mg/dL (ref 0.3–1.2)
Total Protein: 7.5 g/dL (ref 6.0–8.3)

## 2013-04-05 LAB — CBC WITH DIFFERENTIAL/PLATELET
Basophils Absolute: 0 10*3/uL (ref 0.0–0.1)
Basophils Relative: 0 % (ref 0–1)
Eosinophils Absolute: 0.3 10*3/uL (ref 0.0–0.7)
Eosinophils Relative: 3 % (ref 0–5)
HCT: 40 % (ref 36.0–46.0)
Hemoglobin: 13.1 g/dL (ref 12.0–15.0)
Lymphocytes Relative: 23 % (ref 12–46)
Lymphs Abs: 2.7 10*3/uL (ref 0.7–4.0)
MCH: 26.9 pg (ref 26.0–34.0)
MCHC: 32.8 g/dL (ref 30.0–36.0)
MCV: 82.1 fL (ref 78.0–100.0)
Monocytes Absolute: 0.8 10*3/uL (ref 0.1–1.0)
Monocytes Relative: 7 % (ref 3–12)
Neutro Abs: 7.8 10*3/uL — ABNORMAL HIGH (ref 1.7–7.7)
Neutrophils Relative %: 67 % (ref 43–77)
Platelets: 315 10*3/uL (ref 150–400)
RBC: 4.87 MIL/uL (ref 3.87–5.11)
RDW: 14 % (ref 11.5–15.5)
WBC: 11.6 10*3/uL — ABNORMAL HIGH (ref 4.0–10.5)

## 2013-04-05 LAB — URINALYSIS, ROUTINE W REFLEX MICROSCOPIC
Glucose, UA: >1000 mg/dL — AB
Ketones, ur: NEGATIVE mg/dL
Nitrite: NEGATIVE
Protein, ur: 30 mg/dL — AB
Specific Gravity, Urine: 1.03 (ref 1.005–1.030)
Urobilinogen, UA: 0.2 mg/dL (ref 0.0–1.0)
pH: 5.5 (ref 5.0–8.0)

## 2013-04-05 LAB — URINE MICROSCOPIC-ADD ON

## 2013-04-05 LAB — WET PREP, GENITAL
Clue Cells Wet Prep HPF POC: NONE SEEN
Trich, Wet Prep: NONE SEEN
Yeast Wet Prep HPF POC: NONE SEEN

## 2013-04-05 LAB — PREGNANCY, URINE: Preg Test, Ur: NEGATIVE

## 2013-04-05 LAB — CG4 I-STAT (LACTIC ACID): Lactic Acid, Venous: 0.82 mmol/L (ref 0.5–2.2)

## 2013-04-05 MED ORDER — CEFTRIAXONE SODIUM 1 G IJ SOLR
INTRAMUSCULAR | Status: AC
  Filled 2013-04-05: qty 10

## 2013-04-05 MED ORDER — CEFTRIAXONE SODIUM 1 G IJ SOLR
1.0000 g | Freq: Once | INTRAMUSCULAR | Status: AC
  Administered 2013-04-05: 1 g via INTRAVENOUS

## 2013-04-05 MED ORDER — HYDROMORPHONE HCL PF 1 MG/ML IJ SOLN
1.0000 mg | Freq: Once | INTRAMUSCULAR | Status: AC
  Administered 2013-04-05: 1 mg via INTRAVENOUS
  Filled 2013-04-05: qty 1

## 2013-04-05 MED ORDER — IOHEXOL 300 MG/ML  SOLN
50.0000 mL | Freq: Once | INTRAMUSCULAR | Status: AC | PRN
  Administered 2013-04-05: 50 mL via ORAL

## 2013-04-05 MED ORDER — SODIUM CHLORIDE 0.9 % IV BOLUS (SEPSIS)
2000.0000 mL | Freq: Once | INTRAVENOUS | Status: AC
  Administered 2013-04-05: 2000 mL via INTRAVENOUS

## 2013-04-05 MED ORDER — IOHEXOL 300 MG/ML  SOLN
100.0000 mL | Freq: Once | INTRAMUSCULAR | Status: AC | PRN
  Administered 2013-04-05: 100 mL via INTRAVENOUS

## 2013-04-05 MED ORDER — ONDANSETRON HCL 4 MG/2ML IJ SOLN
4.0000 mg | Freq: Once | INTRAMUSCULAR | Status: AC
  Administered 2013-04-05: 4 mg via INTRAVENOUS

## 2013-04-05 MED ORDER — ONDANSETRON HCL 4 MG/2ML IJ SOLN
4.0000 mg | Freq: Once | INTRAMUSCULAR | Status: AC
  Administered 2013-04-05: 4 mg via INTRAVENOUS
  Filled 2013-04-05: qty 2

## 2013-04-05 MED ORDER — ONDANSETRON HCL 4 MG/2ML IJ SOLN
4.0000 mg | Freq: Once | INTRAMUSCULAR | Status: DC
  Filled 2013-04-05: qty 2

## 2013-04-05 NOTE — ED Notes (Signed)
Right lower quad pain. She was seen 2 days ago for same. States she has had the same pain a year ago.

## 2013-04-05 NOTE — ED Provider Notes (Signed)
compalins of right lower quadrant pain onset approximately 4 days ago. Patient has had apparently 4 episodes of vomiting and 2 episodes of diarrhea today. She also admits to temperature 101.5 earlier today. She continues to complain of pain and nausea after treatment with hydromorphone and Zofran at 11:10 PM. She feels improved after treatment with a second dose of hydromorphone and Zofran intravenously at 11:55 PM. On exam patient is alert nontoxic Glasgow Coma Score 15 lungs clear auscultation heart regular rate and rhythm abdomen obese normoactive bowel sounds tender at right lower quadrant. Right lower extremity is mildly reddened at calf and shin. Neurovascular intact all other extremities no redness swelling or tenderness neurovascularly intact. Assessment patient reports that right lower extremity is improved over recent past. She's recently been treated for cellulitis. Medical decision making :In light of vomiting, history of fever, immunocompromise state (diabetes), hyperglycemia, lack of followup due to no primary care physician and pain, I  feel that patient deserved inpatient stay for intravenous fluids and antibiotics. Pt signed out to Dr. Rhunette Croft at 1210 am Results for orders placed during the hospital encounter of 04/05/13  WET PREP, GENITAL      Result Value Range   Yeast Wet Prep HPF POC NONE SEEN  NONE SEEN   Trich, Wet Prep NONE SEEN  NONE SEEN   Clue Cells Wet Prep HPF POC NONE SEEN  NONE SEEN   WBC, Wet Prep HPF POC FEW (*) NONE SEEN  URINALYSIS, ROUTINE W REFLEX MICROSCOPIC      Result Value Range   Color, Urine AMBER (*) YELLOW   APPearance TURBID (*) CLEAR   Specific Gravity, Urine 1.030  1.005 - 1.030   pH 5.5  5.0 - 8.0   Glucose, UA >1000 (*) NEGATIVE mg/dL   Hgb urine dipstick SMALL (*) NEGATIVE   Bilirubin Urine SMALL (*) NEGATIVE   Ketones, ur NEGATIVE  NEGATIVE mg/dL   Protein, ur 30 (*) NEGATIVE mg/dL   Urobilinogen, UA 0.2  0.0 - 1.0 mg/dL   Nitrite NEGATIVE   NEGATIVE   Leukocytes, UA LARGE (*) NEGATIVE  URINE MICROSCOPIC-ADD ON      Result Value Range   Squamous Epithelial / LPF FEW (*) RARE   WBC, UA TOO NUMEROUS TO COUNT  <3 WBC/hpf   RBC / HPF 7-10  <3 RBC/hpf   Bacteria, UA FEW (*) RARE   Urine-Other FEW YEAST    CBC WITH DIFFERENTIAL      Result Value Range   WBC 11.6 (*) 4.0 - 10.5 K/uL   RBC 4.87  3.87 - 5.11 MIL/uL   Hemoglobin 13.1  12.0 - 15.0 g/dL   HCT 09.8  11.9 - 14.7 %   MCV 82.1  78.0 - 100.0 fL   MCH 26.9  26.0 - 34.0 pg   MCHC 32.8  30.0 - 36.0 g/dL   RDW 82.9  56.2 - 13.0 %   Platelets 315  150 - 400 K/uL   Neutrophils Relative % 67  43 - 77 %   Neutro Abs 7.8 (*) 1.7 - 7.7 K/uL   Lymphocytes Relative 23  12 - 46 %   Lymphs Abs 2.7  0.7 - 4.0 K/uL   Monocytes Relative 7  3 - 12 %   Monocytes Absolute 0.8  0.1 - 1.0 K/uL   Eosinophils Relative 3  0 - 5 %   Eosinophils Absolute 0.3  0.0 - 0.7 K/uL   Basophils Relative 0  0 - 1 %   Basophils  Absolute 0.0  0.0 - 0.1 K/uL  COMPREHENSIVE METABOLIC PANEL      Result Value Range   Sodium 136  135 - 145 mEq/L   Potassium 3.7  3.5 - 5.1 mEq/L   Chloride 97  96 - 112 mEq/L   CO2 27  19 - 32 mEq/L   Glucose, Bld 364 (*) 70 - 99 mg/dL   BUN 7  6 - 23 mg/dL   Creatinine, Ser 1.61  0.50 - 1.10 mg/dL   Calcium 9.1  8.4 - 09.6 mg/dL   Total Protein 7.5  6.0 - 8.3 g/dL   Albumin 3.3 (*) 3.5 - 5.2 g/dL   AST 17  0 - 37 U/L   ALT 17  0 - 35 U/L   Alkaline Phosphatase 112  39 - 117 U/L   Total Bilirubin 0.5  0.3 - 1.2 mg/dL   GFR calc non Af Amer >90  >90 mL/min   GFR calc Af Amer >90  >90 mL/min  PREGNANCY, URINE      Result Value Range   Preg Test, Ur NEGATIVE  NEGATIVE  CG4 I-STAT (LACTIC ACID)      Result Value Range   Lactic Acid, Venous 0.82  0.5 - 2.2 mmol/L   Ct Abdomen Pelvis W Contrast  04/05/2013   CLINICAL DATA:  Right lower quadrant pain.  EXAM: CT ABDOMEN AND PELVIS WITH CONTRAST  TECHNIQUE: Multidetector CT imaging of the abdomen and pelvis was  performed using the standard protocol following bolus administration of intravenous contrast.  CONTRAST:  50mL OMNIPAQUE IOHEXOL 300 MG/ML SOLN, OMNIPAQUE IOHEXOL 300 MG/ML SOLN  COMPARISON:  10/20/2011  FINDINGS: BODY WALL: Unremarkable.  LOWER CHEST: Unremarkable.  ABDOMEN/PELVIS:  Liver: Diffuse fatty infiltration of the liver.  Biliary: Cholecystectomy.  Pancreas: Unremarkable.  Spleen: Unremarkable.  Adrenals: Unremarkable.  Kidneys and ureters: Small peripelvic cysts bilaterally. No hydronephrosis or nephrolithiasis.  Bladder: Decompressed.  Reproductive: Bilateral tubal ligation.  Bowel: No obstruction. Normal appendix.  Retroperitoneum: Enlarged porta hepatis and portacaval lymph nodes, likely reactive to the hepatic steatosis.  Peritoneum: No free fluid or gas.  Vascular: No acute abnormality.  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  IMPRESSION: 1. No evidence of acute intra-abdominal disease.  Normal appendix. 2. Fatty liver.   Electronically Signed   By: Tiburcio Pea M.D.   On: 04/05/2013 23:17   Dg Abd Acute W/chest  04/02/2013   CLINICAL DATA:  Right parotid abdominal pain.  EXAM: ACUTE ABDOMEN SERIES (ABDOMEN 2 VIEW & CHEST 1 VIEW)  COMPARISON:  02/10/2013 chest x-ray  FINDINGS: There is no evidence of dilated bowel loops or free intraperitoneal air. No radiopaque calculi or other significant radiographic abnormality is seen. Cholecystectomy and tubal ligation. Heart size and mediastinal contours are within normal limits. Both lungs are clear.  IMPRESSION: Negative abdominal radiographs.  No acute cardiopulmonary disease.   Electronically Signed   By: Tiburcio Pea M.D.   On: 04/02/2013 23:22   Dx#1 acute pyelonephitis #2 hyperglycemia    Doug Sou, MD 04/06/13 0454

## 2013-04-05 NOTE — ED Provider Notes (Signed)
CSN: [id number]     Arrival date & time 04/05/13  2006 History   First MD Initiated Contact with Patient 04/05/13 2016     Chief Complaint  Patient presents with  . Abdominal Pain   (Consider location/radiation/quality/duration/timing/severity/associated sxs/prior Treatment) Patient is a 42 y.o. female presenting with abdominal pain. The history is provided by the patient. No language interpreter was used.  Abdominal Pain Pain location:  RLQ Pain quality: aching   Pain radiates to:  RLQ Onset quality:  Gradual Timing:  Constant Progression:  Worsening Chronicity:  New Relieved by:  Nothing Worsened by:  Nothing tried Associated symptoms: nausea   Pt complains of right lower abdomen pain.   Pt reports she was seen here 3 days ago and was tole to return if pain worsened  Past Medical History  Diagnosis Date  . Diabetes mellitus   . GERD (gastroesophageal reflux disease)   . Cellulitis 07/2011   Past Surgical History  Procedure Laterality Date  . Cholecystectomy    . Knee arthroscopy    . Laparoscopic tubal ligation  04/29/2011    Procedure: LAPAROSCOPIC TUBAL LIGATION;  Surgeon: Turner Daniels, MD;  Location: [facility];  Service: Gynecology;  Laterality: Bilateral;  with Filshie Clips   No family history on file. History  Substance Use Topics  . Smoking status: Former Smoker -- 0.50 packs/day    Types: Cigarettes    Quit date: 07/22/2011  . Smokeless tobacco: Never Used  . Alcohol Use: Yes     Comment: occasional   OB History   Grav Para Term Preterm Abortions TAB SAB Ect Mult Living                 Review of Systems  Gastrointestinal: Positive for nausea and abdominal pain.  All other systems reviewed and are negative.    Allergies  Vancomycin and Adhesive  Home Medications   Current Outpatient Rx  Name  Route  Sig  Dispense  Refill  . acetaminophen (TYLENOL) 500 MG tablet   Oral   Take 1,000 mg by mouth every 6 (six) hours as needed. For pain         .  cephALEXin (KEFLEX) 500 MG capsule   Oral   Take 1 capsule (500 mg total) by mouth 4 (four) times daily.   40 capsule   0   . furosemide (LASIX) 20 MG tablet   Oral   Take 1 tablet (20 mg total) by mouth daily.   30 tablet   0   . HYDROcodone-acetaminophen (NORCO/VICODIN) 5-325 MG per tablet   Oral   Take 2 tablets by mouth every 4 (four) hours as needed.   10 tablet   0   . HYDROcodone-acetaminophen (NORCO/VICODIN) 5-325 MG per tablet   Oral   Take 2 tablets by mouth every 4 (four) hours as needed.   10 tablet   0   . ibuprofen (ADVIL,MOTRIN) 400 MG tablet   Oral   Take 1 tablet (400 mg total) by mouth 3 (three) times daily with meals as needed for pain (or headache).   10 tablet   0   . metFORMIN (GLUCOPHAGE) 500 MG tablet   Oral   Take 1 tablet (500 mg total) by mouth 2 (two) times daily with a meal.   60 tablet   0   . omeprazole (PRILOSEC OTC) 20 MG tablet   Oral   Take 20 mg by mouth daily.         Marland Kitchen  ondansetron (ZOFRAN ODT) 8 MG disintegrating tablet      8mg  ODT q4 hours prn nausea   4 tablet   0    BP 127/76  Pulse 116  Temp(Src) 97.5 F (36.4 C) (Oral)  Resp 20  Ht 5\' 1"  (1.549 m)  Wt 327 lb (148.326 kg)  BMI 61.82 kg/m2  SpO2 100% Physical Exam  Nursing note and vitals reviewed. Constitutional: She is oriented to person, place, and time. She appears well-developed and well-nourished.  HENT:  Head: Normocephalic.  Right Ear: External ear normal.  Left Ear: External ear normal.  Nose: Nose normal.  Eyes: EOM are normal. Pupils are equal, round, and reactive to light.  Neck: Normal range of motion.  Cardiovascular: Normal rate.   Pulmonary/Chest: Effort normal and breath sounds normal.  Abdominal: Soft. She exhibits no distension.  Genitourinary: Vaginal discharge found.  Erythema  Thick white discharge,   Musculoskeletal: Normal range of motion.  Neurological: She is alert and oriented to person, place, and time.  Skin: Skin is warm.   Psychiatric: She has a normal mood and affect.    ED Course  Procedures (including critical care time) Labs Review Labs Reviewed  URINALYSIS, ROUTINE W REFLEX MICROSCOPIC - Abnormal; Notable for the following:    Color, Urine AMBER (*)    APPearance TURBID (*)    Glucose, UA >1000 (*)    Hgb urine dipstick SMALL (*)    Bilirubin Urine SMALL (*)    Protein, ur 30 (*)    Leukocytes, UA LARGE (*)    All other components within normal limits  URINE MICROSCOPIC-ADD ON - Abnormal; Notable for the following:    Squamous Epithelial / LPF FEW (*)    Bacteria, UA FEW (*)    All other components within normal limits  CBC WITH DIFFERENTIAL - Abnormal; Notable for the following:    WBC 11.6 (*)    Neutro Abs 7.8 (*)    All other components within normal limits  URINE CULTURE  COMPREHENSIVE METABOLIC PANEL   Imaging Review No results found.  EKG Interpretation   None      Results for orders placed during the hospital encounter of 04/05/13  URINALYSIS, ROUTINE W REFLEX MICROSCOPIC      Result Value Range   Color, Urine AMBER (*) YELLOW   APPearance TURBID (*) CLEAR   Specific Gravity, Urine 1.030  1.005 - 1.030   pH 5.5  5.0 - 8.0   Glucose, UA >1000 (*) NEGATIVE mg/dL   Hgb urine dipstick SMALL (*) NEGATIVE   Bilirubin Urine SMALL (*) NEGATIVE   Ketones, ur NEGATIVE  NEGATIVE mg/dL   Protein, ur 30 (*) NEGATIVE mg/dL   Urobilinogen, UA 0.2  0.0 - 1.0 mg/dL   Nitrite NEGATIVE  NEGATIVE   Leukocytes, UA LARGE (*) NEGATIVE  URINE MICROSCOPIC-ADD ON      Result Value Range   Squamous Epithelial / LPF FEW (*) RARE   WBC, UA TOO NUMEROUS TO COUNT  <3 WBC/hpf   RBC / HPF 7-10  <3 RBC/hpf   Bacteria, UA FEW (*) RARE   Urine-Other FEW YEAST    CBC WITH DIFFERENTIAL      Result Value Range   WBC 11.6 (*) 4.0 - 10.5 K/uL   RBC 4.87  3.87 - 5.11 MIL/uL   Hemoglobin 13.1  12.0 - 15.0 g/dL   HCT 40.9  81.1 - 91.4 %   MCV 82.1  78.0 - 100.0 fL   MCH 26.9  26.0 - 34.0 pg   MCHC  32.8  30.0 - 36.0 g/dL   RDW 65.7  84.6 - 96.2 %   Platelets 315  150 - 400 K/uL   Neutrophils Relative % 67  43 - 77 %   Neutro Abs 7.8 (*) 1.7 - 7.7 K/uL   Lymphocytes Relative 23  12 - 46 %   Lymphs Abs 2.7  0.7 - 4.0 K/uL   Monocytes Relative 7  3 - 12 %   Monocytes Absolute 0.8  0.1 - 1.0 K/uL   Eosinophils Relative 3  0 - 5 %   Eosinophils Absolute 0.3  0.0 - 0.7 K/uL   Basophils Relative 0  0 - 1 %   Basophils Absolute 0.0  0.0 - 0.1 K/uL  COMPREHENSIVE METABOLIC PANEL      Result Value Range   Sodium 136  135 - 145 mEq/L   Potassium 3.7  3.5 - 5.1 mEq/L   Chloride 97  96 - 112 mEq/L   CO2 27  19 - 32 mEq/L   Glucose, Bld 364 (*) 70 - 99 mg/dL   BUN 7  6 - 23 mg/dL   Creatinine, Ser 9.52  0.50 - 1.10 mg/dL   Calcium 9.1  8.4 - 84.1 mg/dL   Total Protein 7.5  6.0 - 8.3 g/dL   Albumin 3.3 (*) 3.5 - 5.2 g/dL   AST 17  0 - 37 U/L   ALT 17  0 - 35 U/L   Alkaline Phosphatase 112  39 - 117 U/L   Total Bilirubin 0.5  0.3 - 1.2 mg/dL   GFR calc non Af Amer >90  >90 mL/min   GFR calc Af Amer >90  >90 mL/min   Dg Abd Acute W/chest  04/02/2013   CLINICAL DATA:  Right parotid abdominal pain.  EXAM: ACUTE ABDOMEN SERIES (ABDOMEN 2 VIEW & CHEST 1 VIEW)  COMPARISON:  02/10/2013 chest x-ray  FINDINGS: There is no evidence of dilated bowel loops or free intraperitoneal air. No radiopaque calculi or other significant radiographic abnormality is seen. Cholecystectomy and tubal ligation. Heart size and mediastinal contours are within normal limits. Both lungs are clear.  IMPRESSION: Negative abdominal radiographs.  No acute cardiopulmonary disease.   Electronically Signed   By: Tiburcio Pea M.D.   On: 04/02/2013 23:22   Nurse and I attempted cath urine without success MDM  No diagnosis found. Pt's care turned over to Dr. Ethelda Chick  Ct pending   Elson Areas, PA-C 04/05/13 2146  Lonia Skinner [place], [place] 04/05/13 2211

## 2013-04-06 ENCOUNTER — Encounter (HOSPITAL_COMMUNITY): Payer: Self-pay | Admitting: *Deleted

## 2013-04-06 DIAGNOSIS — E669 Obesity, unspecified: Secondary | ICD-10-CM

## 2013-04-06 DIAGNOSIS — K219 Gastro-esophageal reflux disease without esophagitis: Secondary | ICD-10-CM

## 2013-04-06 DIAGNOSIS — N12 Tubulo-interstitial nephritis, not specified as acute or chronic: Secondary | ICD-10-CM | POA: Diagnosis present

## 2013-04-06 DIAGNOSIS — R1031 Right lower quadrant pain: Secondary | ICD-10-CM

## 2013-04-06 DIAGNOSIS — E119 Type 2 diabetes mellitus without complications: Secondary | ICD-10-CM

## 2013-04-06 DIAGNOSIS — N10 Acute tubulo-interstitial nephritis: Principal | ICD-10-CM

## 2013-04-06 DIAGNOSIS — R509 Fever, unspecified: Secondary | ICD-10-CM

## 2013-04-06 LAB — URINE CULTURE: Colony Count: >=100000

## 2013-04-06 LAB — GLUCOSE, CAPILLARY
Glucose-Capillary: 263 mg/dL — ABNORMAL HIGH (ref 70–99)
Glucose-Capillary: 234 mg/dL — ABNORMAL HIGH (ref 70–99)
Glucose-Capillary: 231 mg/dL — ABNORMAL HIGH (ref 70–99)
Glucose-Capillary: 254 mg/dL — ABNORMAL HIGH (ref 70–99)
Glucose-Capillary: 210 mg/dL — ABNORMAL HIGH (ref 70–99)

## 2013-04-06 LAB — HEMOGLOBIN A1C
Hgb A1c MFr Bld: 11.6 % — ABNORMAL HIGH (ref <5.7)
Mean Plasma Glucose: 286 mg/dL — ABNORMAL HIGH (ref <117)

## 2013-04-06 MED ORDER — ONDANSETRON HCL 4 MG PO TABS: 4.0000 mg | ORAL_TABLET | Freq: Four times a day (QID) | ORAL | Status: DC | PRN

## 2013-04-06 MED ORDER — SODIUM CHLORIDE 0.9 % IV SOLN
INTRAVENOUS | Status: AC
  Administered 2013-04-06: 05:00:00 via INTRAVENOUS

## 2013-04-06 MED ORDER — INSULIN GLARGINE 100 UNIT/ML ~~LOC~~ SOLN
10.0000 [IU] | Freq: Every day | SUBCUTANEOUS | Status: DC
  Administered 2013-04-06 – 2013-04-07 (×2): 10 [IU] via SUBCUTANEOUS
  Filled 2013-04-06 (×2): qty 0.1

## 2013-04-06 MED ORDER — INSULIN ASPART 100 UNIT/ML ~~LOC~~ SOLN
0.0000 [IU] | Freq: Three times a day (TID) | SUBCUTANEOUS | Status: DC
  Administered 2013-04-06: 5 [IU] via SUBCUTANEOUS
  Administered 2013-04-06 – 2013-04-07 (×3): 3 [IU] via SUBCUTANEOUS
  Administered 2013-04-07: 2 [IU] via SUBCUTANEOUS
  Administered 2013-04-07: 3 [IU] via SUBCUTANEOUS
  Administered 2013-04-08: 1 [IU] via SUBCUTANEOUS
  Administered 2013-04-08: 3 [IU] via SUBCUTANEOUS

## 2013-04-06 MED ORDER — HYDROMORPHONE HCL PF 1 MG/ML IJ SOLN
0.5000 mg | INTRAMUSCULAR | Status: DC | PRN
  Administered 2013-04-06 – 2013-04-07 (×5): 1 mg via INTRAVENOUS
  Filled 2013-04-06 (×5): qty 1

## 2013-04-06 MED ORDER — ACETAMINOPHEN 325 MG PO TABS: 650.0000 mg | ORAL_TABLET | Freq: Four times a day (QID) | ORAL | Status: DC | PRN

## 2013-04-06 MED ORDER — FLUCONAZOLE 50 MG PO TABS
150.0000 mg | ORAL_TABLET | Freq: Once | ORAL | Status: AC
  Administered 2013-04-06: 150 mg via ORAL
  Filled 2013-04-06 (×2): qty 1

## 2013-04-06 MED ORDER — OXYCODONE HCL 5 MG PO TABS
5.0000 mg | ORAL_TABLET | ORAL | Status: DC | PRN
  Administered 2013-04-07 – 2013-04-08 (×2): 5 mg via ORAL
  Filled 2013-04-06 (×2): qty 1

## 2013-04-06 MED ORDER — OMEPRAZOLE MAGNESIUM 20 MG PO TBEC: 20.0000 mg | DELAYED_RELEASE_TABLET | Freq: Every day | ORAL | Status: DC

## 2013-04-06 MED ORDER — INSULIN ASPART 100 UNIT/ML ~~LOC~~ SOLN
0.0000 [IU] | Freq: Every day | SUBCUTANEOUS | Status: DC
  Administered 2013-04-06: 2 [IU] via SUBCUTANEOUS

## 2013-04-06 MED ORDER — FUROSEMIDE 20 MG PO TABS
20.0000 mg | ORAL_TABLET | Freq: Every day | ORAL | Status: DC
  Administered 2013-04-06 – 2013-04-08 (×3): 20 mg via ORAL
  Filled 2013-04-06 (×4): qty 1

## 2013-04-06 MED ORDER — PANTOPRAZOLE SODIUM 40 MG PO TBEC
40.0000 mg | DELAYED_RELEASE_TABLET | Freq: Every day | ORAL | Status: DC
  Administered 2013-04-06 – 2013-04-08 (×3): 40 mg via ORAL
  Filled 2013-04-06 (×3): qty 1

## 2013-04-06 MED ORDER — DEXTROSE 5 % IV SOLN
1.0000 g | INTRAVENOUS | Status: DC
  Administered 2013-04-06 – 2013-04-07 (×2): 1 g via INTRAVENOUS
  Filled 2013-04-06 (×4): qty 10

## 2013-04-06 MED ORDER — ALUM & MAG HYDROXIDE-SIMETH 200-200-20 MG/5ML PO SUSP: 30.0000 mL | Freq: Four times a day (QID) | ORAL | Status: DC | PRN

## 2013-04-06 MED ORDER — ZOLPIDEM TARTRATE 5 MG PO TABS: 5.0000 mg | ORAL_TABLET | Freq: Every evening | ORAL | Status: DC | PRN

## 2013-04-06 MED ORDER — SODIUM CHLORIDE 0.9 % IV SOLN
INTRAVENOUS | Status: DC
  Administered 2013-04-06 – 2013-04-07 (×4): via INTRAVENOUS

## 2013-04-06 MED ORDER — ACETAMINOPHEN 650 MG RE SUPP: 650.0000 mg | Freq: Four times a day (QID) | RECTAL | Status: DC | PRN

## 2013-04-06 MED ORDER — ONDANSETRON HCL 4 MG/2ML IJ SOLN
4.0000 mg | Freq: Four times a day (QID) | INTRAMUSCULAR | Status: DC | PRN
  Administered 2013-04-06 – 2013-04-07 (×3): 4 mg via INTRAVENOUS
  Filled 2013-04-06 (×3): qty 2

## 2013-04-06 NOTE — Progress Notes (Signed)
Patient seen and examined this morning. Admission H&P reviewed. Right sided abdominal pain currently resolved. Continue empiric antibiotic for pyelonephritis. Pending urine culture. Continue sliding scale insulin. Follow A1c. I have added on Lantus 10 units at bedtime. Continue IV fluids.

## 2013-04-06 NOTE — H&P (Signed)
[facility] History and Physical  ABIA MONACO WUJ:[id number] DOB: 03-13-1971 DOA: 04/05/2013  Referring physician:  EDP PCP: Herb Grays, MD  Specialists:   Chief Complaint:  RLQ ABD and Flank Pain  HPI: [name] is a 42 y.o. female with a history of DM2 who presented to the [facility] ED with complaints of Nausea and RLQ and Right Flank Pain x 3 days.  She describes her pain as being sharp and burning, and rates the pain as an 8/10 at the worse.  She began to have fevers and chills yesterday.  In the ED she was evaluated and had a CT scan performed which was negative for appendicitis,    Review of Systems: The patient denies anorexia, fever, chills, headaches, weight loss,, vision loss, diplopia, dizziness, decreased hearing, rhinitis, hoarseness, chest pain, syncope, dyspnea on exertion, peripheral edema, balance deficits, cough, hemoptysis, abdominal pain, nausea, vomiting, diarrhea, constipation, hematemesis, melena, hematochezia, severe indigestion/heartburn, dysuria, hematuria, incontinence, muscle weakness, suspicious skin lesions, transient blindness, difficulty walking, depression, unusual weight change, abnormal bleeding, enlarged lymph nodes, angioedema, and breast masses.    Past Medical History  Diagnosis Date  . Diabetes mellitus   . GERD (gastroesophageal reflux disease)   . Cellulitis 07/2011    Past Surgical History  Procedure Laterality Date  . Cholecystectomy    . Knee arthroscopy    . Laparoscopic tubal ligation  04/29/2011    Procedure: LAPAROSCOPIC TUBAL LIGATION;  Surgeon: Turner Daniels, MD;  Location: [facility];  Service: Gynecology;  Laterality: Bilateral;  with Filshie Clips    Prior to Admission medications   Medication Sig Start Date End Date Taking? Authorizing Provider  acetaminophen (TYLENOL) 500 MG tablet Take 1,000 mg by mouth every 6 (six) hours as needed. For pain    Historical Provider, MD  cephALEXin (KEFLEX) 500 MG capsule Take 1  capsule (500 mg total) by mouth 4 (four) times daily. 03/11/13   Elson Areas, PA-C  furosemide (LASIX) 20 MG tablet Take 1 tablet (20 mg total) by mouth daily. 09/11/12   Zannie Cove, MD  HYDROcodone-acetaminophen (NORCO/VICODIN) 5-325 MG per tablet Take 2 tablets by mouth every 4 (four) hours as needed. 03/11/13   Elson Areas, PA-C  HYDROcodone-acetaminophen (NORCO/VICODIN) 5-325 MG per tablet Take 2 tablets by mouth every 4 (four) hours as needed. 04/02/13   Rolan Bucco, MD  ibuprofen (ADVIL,MOTRIN) 400 MG tablet Take 1 tablet (400 mg total) by mouth 3 (three) times daily with meals as needed for pain (or headache). 09/11/12   Zannie Cove, MD  metFORMIN (GLUCOPHAGE) 500 MG tablet Take 1 tablet (500 mg total) by mouth 2 (two) times daily with a meal. 09/11/12   Zannie Cove, MD  omeprazole (PRILOSEC OTC) 20 MG tablet Take 20 mg by mouth daily.    Historical Provider, MD  ondansetron (ZOFRAN ODT) 8 MG disintegrating tablet 8mg  ODT q4 hours prn nausea 04/02/13   Rolan Bucco, MD    Allergies  Allergen Reactions  . Vancomycin     Red man syndrome  . Adhesive [Tape] Itching and Rash    Social History:  reports that she quit smoking about 20 months ago. Her smoking use included Cigarettes. She smoked 0.50 packs per day. She has never used smokeless tobacco. She reports that she drinks alcohol. She reports that she does not use illicit drugs.     Family History:     Mother with CHF, HTN, DM2, and Breast Cancer  Father with Alzheimers Dementia and Parkinson  Disease   Physical Exam:  GEN:  Pleasant  Morbidly Obese  42 y.o. Caucasian female  examined  and in no acute distress; cooperative with exam Filed Vitals:   04/06/13 0231 04/06/13 0340 04/06/13 0344 04/06/13 0428  BP: 144/78 105/48 105/48 117/55  Pulse: 99 90 89 89  Temp:   97.9 F (36.6 C) 97.8 F (36.6 C)  TempSrc:   Oral Oral  Resp: 18  20 20   Height:    5\' 1"  (1.549 m)  Weight:    147.1 kg (324 lb 4.8 oz)  SpO2:  99% 96% 96% 99%   Blood pressure 117/55, pulse 89, temperature 97.8 F (36.6 C), temperature source Oral, resp. rate 20, height 5\' 1"  (1.549 m), weight 147.1 kg (324 lb 4.8 oz), SpO2 99.00%. PSYCH: SHe is alert and oriented x4; does not appear anxious does not appear depressed; affect is normal HEENT: Normocephalic and Atraumatic, Mucous membranes pink; PERRLA; EOM intact; Fundi:  Benign;  No scleral icterus, Nares: Patent, Oropharynx: Clear, Fair Dentition, Neck:  FROM, no cervical lymphadenopathy nor thyromegaly or carotid bruit; no JVD; Breasts:: Not examined CHEST WALL: No tenderness CHEST: Normal respiration, clear to auscultation bilaterally HEART: Regular rate and rhythm; no murmurs rubs or gallops BACK: No kyphosis or scoliosis; no CVA tenderness ABDOMEN: Positive Bowel Sounds, Obese, soft  +Tenderness in the RLQ; No Rebound , No Guarding,  no masses, no organomegaly, no pannus; no intertriginous candida. Rectal Exam: Not done EXTREMITIES: No cyanosis, clubbing or edema; no ulcerations. Genitalia: not examined PULSES: 2+ and symmetric SKIN: Normal hydration no rash or ulceration CNS: Cranial nerves 2-12 grossly intact no focal neurologic deficit    Labs on Admission:  Basic Metabolic Panel:  Recent Labs Lab 04/02/13 2213 04/05/13 2054  NA 134* 136  K 3.7 3.7  CL 98 97  CO2 24 27  GLUCOSE 316* 364*  BUN 9 7  CREATININE 0.60 0.70  CALCIUM 9.5 9.1   Liver Function Tests:  Recent Labs Lab 04/02/13 2213 04/05/13 2054  AST 15 17  ALT 19 17  ALKPHOS 117 112  BILITOT 0.4 0.5  PROT 7.8 7.5  ALBUMIN 3.3* 3.3*   No results found for this basename: LIPASE, AMYLASE,  in the last 168 hours No results found for this basename: AMMONIA,  in the last 168 hours CBC:  Recent Labs Lab 04/02/13 2213 04/05/13 2054  WBC 13.1* 11.6*  NEUTROABS  --  7.8*  HGB 13.7 13.1  HCT 41.0 40.0  MCV 81.0 82.1  PLT 365 315   Cardiac Enzymes: No results found for this basename:  CKTOTAL, CKMB, CKMBINDEX, TROPONINI,  in the last 168 hours  BNP (last 3 results) No results found for this basename: PROBNP,  in the last 8760 hours CBG:  Recent Labs Lab 04/06/13 0438  GLUCAP 263*    Radiological Exams on Admission: Ct Abdomen Pelvis W Contrast  04/05/2013   CLINICAL DATA:  Right lower quadrant pain.  EXAM: CT ABDOMEN AND PELVIS WITH CONTRAST  TECHNIQUE: Multidetector CT imaging of the abdomen and pelvis was performed using the standard protocol following bolus administration of intravenous contrast.  CONTRAST:  50mL OMNIPAQUE IOHEXOL 300 MG/ML SOLN, OMNIPAQUE IOHEXOL 300 MG/ML SOLN  COMPARISON:  10/20/2011  FINDINGS: BODY WALL: Unremarkable.  LOWER CHEST: Unremarkable.  ABDOMEN/PELVIS:  Liver: Diffuse fatty infiltration of the liver.  Biliary: Cholecystectomy.  Pancreas: Unremarkable.  Spleen: Unremarkable.  Adrenals: Unremarkable.  Kidneys and ureters: Small peripelvic cysts bilaterally. No hydronephrosis or nephrolithiasis.  Bladder:  Decompressed.  Reproductive: Bilateral tubal ligation.  Bowel: No obstruction. Normal appendix.  Retroperitoneum: Enlarged porta hepatis and portacaval lymph nodes, likely reactive to the hepatic steatosis.  Peritoneum: No free fluid or gas.  Vascular: No acute abnormality.  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  IMPRESSION: 1. No evidence of acute intra-abdominal disease.  Normal appendix. 2. Fatty liver.   Electronically Signed   By: Tiburcio Pea M.D.   On: 04/05/2013 23:17       Assessment/Plan Principal Problem:   RLQ abdominal pain Active Problems:   Fever   Pyelonephritis   Diabetes mellitus   GERD (gastroesophageal reflux disease)   Obesity    1.   RLQ ABD Pain/Pyelonephritis/ Fever-  Clinically diagnosed as Pyelonephritis and urine c+S sent and placed on IV Rocephin.  IVFs, and Pain control.  CT Scan of the ABD Pelvis negative for Appendicitis.    2.   DM2- Check HbA1C and SSI coverage ordered.    3.    GERD- On PPI rx Omeprazole.     4.   Obesity-  Needs Weight Loss.       Code Status:  FULL CODE  Family Communication:    No Family Present Disposition Plan:   Inpatient   Time spent:  63 Minutes  Ron Parker [facility] Pager [phone]  If 7PM-7AM, please contact night-coverage [email or web address] Password [facility] 04/06/2013, 5:23 AM

## 2013-04-06 NOTE — ED Notes (Signed)
Report provided to [facility] and jolie RN on 6 N

## 2013-04-06 NOTE — ED Provider Notes (Signed)
Medical screening examination/treatment/procedure(s) were conducted as a shared visit with non-physician practitioner(s) and myself.  I personally evaluated the patient during the encounter.  EKG Interpretation   None        Doug Sou, MD 04/06/13 0020

## 2013-04-07 DIAGNOSIS — IMO0001 Reserved for inherently not codable concepts without codable children: Secondary | ICD-10-CM

## 2013-04-07 DIAGNOSIS — E119 Type 2 diabetes mellitus without complications: Secondary | ICD-10-CM | POA: Diagnosis present

## 2013-04-07 LAB — CBC
HCT: 38 % (ref 36.0–46.0)
Hemoglobin: 12.4 g/dL (ref 12.0–15.0)
MCH: 27.6 pg (ref 26.0–34.0)
MCHC: 32.6 g/dL (ref 30.0–36.0)
MCV: 84.6 fL (ref 78.0–100.0)
Platelets: 313 10*3/uL (ref 150–400)
RBC: 4.49 MIL/uL (ref 3.87–5.11)
RDW: 14.3 % (ref 11.5–15.5)
WBC: 9.1 10*3/uL (ref 4.0–10.5)

## 2013-04-07 LAB — BASIC METABOLIC PANEL
BUN: 6 mg/dL (ref 6–23)
CO2: 23 mEq/L (ref 19–32)
Calcium: 8.3 mg/dL — ABNORMAL LOW (ref 8.4–10.5)
Chloride: 103 mEq/L (ref 96–112)
Creatinine, Ser: 0.63 mg/dL (ref 0.50–1.10)
GFR calc Af Amer: >90 mL/min (ref >90)
GFR calc non Af Amer: >90 mL/min (ref >90)
Glucose, Bld: 181 mg/dL — ABNORMAL HIGH (ref 70–99)
Potassium: 3.9 mEq/L (ref 3.5–5.1)
Sodium: 138 mEq/L (ref 135–145)

## 2013-04-07 LAB — GLUCOSE, CAPILLARY
Glucose-Capillary: 172 mg/dL — ABNORMAL HIGH (ref 70–99)
Glucose-Capillary: 234 mg/dL — ABNORMAL HIGH (ref 70–99)
Glucose-Capillary: 221 mg/dL — ABNORMAL HIGH (ref 70–99)
Glucose-Capillary: 182 mg/dL — ABNORMAL HIGH (ref 70–99)

## 2013-04-07 LAB — GC/CHLAMYDIA PROBE AMP
CT Probe RNA: NEGATIVE
GC Probe RNA: NEGATIVE

## 2013-04-07 MED ORDER — KETOROLAC TROMETHAMINE 30 MG/ML IJ SOLN
30.0000 mg | Freq: Once | INTRAMUSCULAR | Status: AC
  Administered 2013-04-07: 30 mg via INTRAVENOUS
  Filled 2013-04-07: qty 1

## 2013-04-07 MED ORDER — INSULIN GLARGINE 100 UNIT/ML ~~LOC~~ SOLN
15.0000 [IU] | Freq: Every day | SUBCUTANEOUS | Status: DC
  Administered 2013-04-08: 15 [IU] via SUBCUTANEOUS
  Filled 2013-04-07: qty 0.15

## 2013-04-07 NOTE — Progress Notes (Signed)
[facility] PROGRESS NOTE  [name] ZOX:[id number] DOB: 09-Oct-1970 DOA: 04/05/2013 PCP: none  Brief narrative 42 year old obese female with history of uncontrolled diabetes admitted with nausea with right lower quadrant and right flank pain for 3 days in the setting of UTI with pyelonephritis.  Assessment/Plan: Acute pyelonephritis Urine culture and sensitivity pending. On empiric Rocephin today patient still complains right lower quadrant pain. Leukocytosis resolved. We'll followup with sensitivity and if continu discharge her tomorrow on oral regimen  Uncontrolled diabetes mellitus A1c of 11.6. Patient has not seek medical attention for several months to lack of insurance. She will need to be on insulin. (Likely NovoLog twice a day) . I will arrange for an outpatient followup at the [facility] upon discharge. Counseled on the importance of diet control, medication compliance and exercise to lose weight.  GERD Continue PPI  Obesity Encouraged to lose weight  Code Status:  full code  Family Communication:  none at bedside  Disposition Plan: home   likely tomorrow if continues to improve    Consultants:  none  Procedures:   none  Antibiotics: IV rocephin ( 12/20__)  HPI/Subjective:  patient remains afebrile . Still has some pain over right lower quadrant. No nausea or vomiting  Objective: Filed Vitals:   04/07/13 0457  BP: 123/73  Pulse: 97  Temp: 98.1 F (36.7 C)  Resp: 18    Intake/Output Summary (Last 24 hours) at 04/07/13 1152 Last data filed at 04/07/13 0600  Gross per 24 hour  Intake   3505 ml  Output   2325 ml  Net   1180 ml   Filed Weights   04/05/13 2011 04/06/13 0428  Weight: 148.326 kg (327 lb) 147.1 kg (324 lb 4.8 oz)    Exam:   General:   middle aged obese female in no acute distress  HEENT: No pallor, moist oral mucosa  Chest: Clear to auscultation bilaterally, no added sounds  CVS: Normal S1-S2, no  murmurs or gallop  Abdomen: Soft, nondistended, bowel sounds present, mild right lower quadrant tenderness  Extremities: Warm, no edema  CNS: AAO x3    Data Reviewed: Basic Metabolic Panel:  Recent Labs Lab 04/02/13 2213 04/05/13 2054 04/07/13 0551  NA 134* 136 138  K 3.7 3.7 3.9  CL 98 97 103  CO2 24 27 23   GLUCOSE 316* 364* 181*  BUN 9 7 6   CREATININE 0.60 0.70 0.63  CALCIUM 9.5 9.1 8.3*   Liver Function Tests:  Recent Labs Lab 04/02/13 2213 04/05/13 2054  AST 15 17  ALT 19 17  ALKPHOS 117 112  BILITOT 0.4 0.5  PROT 7.8 7.5  ALBUMIN 3.3* 3.3*   No results found for this basename: LIPASE, AMYLASE,  in the last 168 hours No results found for this basename: AMMONIA,  in the last 168 hours CBC:  Recent Labs Lab 04/02/13 2213 04/05/13 2054 04/07/13 0551  WBC 13.1* 11.6* 9.1  NEUTROABS  --  7.8*  --   HGB 13.7 13.1 12.4  HCT 41.0 40.0 38.0  MCV 81.0 82.1 84.6  PLT 365 315 313   Cardiac Enzymes: No results found for this basename: CKTOTAL, CKMB, CKMBINDEX, TROPONINI,  in the last 168 hours BNP (last 3 results) No results found for this basename: PROBNP,  in the last 8760 hours CBG:  Recent Labs Lab 04/06/13 0438 04/06/13 0734 04/06/13 1131 04/06/13 1805 04/06/13 2152  GLUCAP 263* 234* 231* 254* 210*    Recent Results (from the past 240 hour(s))  URINE CULTURE     Status: None   Collection Time    04/02/13 10:05 PM      Result Value Range Status   Specimen Description URINE, CLEAN CATCH   Final   Special Requests NONE   Final   Culture  Setup Time     Final   Value: 04/03/2013 08:35     Performed at [facility] Count     Final   Value: >=100,000 COLONIES/ML     Performed at [facility]   Culture     Final   Value: Multiple bacterial morphotypes present, none predominant. Suggest appropriate recollection if clinically indicated.     Performed at [facility]   Report Status 04/04/2013 FINAL   Final   URINE CULTURE     Status: None   Collection Time    04/05/13  8:17 PM      Result Value Range Status   Specimen Description URINE, CLEAN CATCH   Final   Special Requests NONE   Final   Culture  Setup Time     Final   Value: 04/05/2013 23:03     Performed at [facility] Count     Final   Value: >=100,000 COLONIES/ML     Performed at [facility]   Culture     Final   Value: Multiple bacterial morphotypes present, none predominant. Suggest appropriate recollection if clinically indicated.     Performed at [facility]   Report Status 04/06/2013 FINAL   Final  WET PREP, GENITAL     Status: Abnormal   Collection Time    04/05/13 10:08 PM      Result Value Range Status   Yeast Wet Prep HPF POC NONE SEEN  NONE SEEN Final   Trich, Wet Prep NONE SEEN  NONE SEEN Final   Clue Cells Wet Prep HPF POC NONE SEEN  NONE SEEN Final   WBC, Wet Prep HPF POC FEW (*) NONE SEEN Final  GC/CHLAMYDIA PROBE AMP     Status: None   Collection Time    04/05/13 10:08 PM      Result Value Range Status   CT Probe RNA NEGATIVE  NEGATIVE Final   GC Probe RNA NEGATIVE  NEGATIVE Final   Comment: (NOTE)                                                                                               **Normal Reference Range: Negative**          Assay performed using the Gen-Probe APTIMA COMBO2 (R) Assay.     Acceptable specimen types for this assay include APTIMA Swabs (Unisex,     endocervical, urethral, or vaginal), first void urine, and ThinPrep     liquid based cytology samples.     Performed at [facility]     Studies: Ct Abdomen Pelvis W Contrast  04/05/2013   CLINICAL DATA:  Right lower quadrant pain.  EXAM: CT ABDOMEN AND PELVIS WITH CONTRAST  TECHNIQUE: Multidetector CT imaging of the abdomen and pelvis  was performed using the standard protocol following bolus administration of intravenous contrast.  CONTRAST:  50mL OMNIPAQUE IOHEXOL 300 MG/ML SOLN,  OMNIPAQUE IOHEXOL 300 MG/ML SOLN  COMPARISON:  10/20/2011  FINDINGS: BODY WALL: Unremarkable.  LOWER CHEST: Unremarkable.  ABDOMEN/PELVIS:  Liver: Diffuse fatty infiltration of the liver.  Biliary: Cholecystectomy.  Pancreas: Unremarkable.  Spleen: Unremarkable.  Adrenals: Unremarkable.  Kidneys and ureters: Small peripelvic cysts bilaterally. No hydronephrosis or nephrolithiasis.  Bladder: Decompressed.  Reproductive: Bilateral tubal ligation.  Bowel: No obstruction. Normal appendix.  Retroperitoneum: Enlarged porta hepatis and portacaval lymph nodes, likely reactive to the hepatic steatosis.  Peritoneum: No free fluid or gas.  Vascular: No acute abnormality.  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  IMPRESSION: 1. No evidence of acute intra-abdominal disease.  Normal appendix. 2. Fatty liver.   Electronically Signed   By: Tiburcio Pea M.D.   On: 04/05/2013 23:17    Scheduled Meds: . cefTRIAXone (ROCEPHIN)  IV  1 g Intravenous Q24H  . furosemide  20 mg Oral Daily  . insulin aspart  0-5 Units Subcutaneous QHS  . insulin aspart  0-9 Units Subcutaneous TID WC  . insulin glargine  10 Units Subcutaneous Daily  . pantoprazole  40 mg Oral Daily   Continuous Infusions: . sodium chloride 125 mL/hr at 04/07/13 0709       Time spent:25 minutes    [name]  [facility] Pager [phone] If 7PM-7AM, please contact night-coverage at [email or web address], password [facility] 04/07/2013, 11:52 AM  LOS: 2 days

## 2013-04-08 LAB — GLUCOSE, CAPILLARY
Glucose-Capillary: 145 mg/dL — ABNORMAL HIGH (ref 70–99)
Glucose-Capillary: 204 mg/dL — ABNORMAL HIGH (ref 70–99)

## 2013-04-08 MED ORDER — METFORMIN HCL 500 MG PO TABS: 1000.0000 mg | ORAL_TABLET | Freq: Two times a day (BID) | ORAL | Status: DC

## 2013-04-08 MED ORDER — "BD GETTING STARTED TAKE HOME KIT: 1ML X 30 G SYRINGES, "
1.0000 | Freq: Once | Status: AC
  Administered 2013-04-08: 1
  Filled 2013-04-08: qty 1

## 2013-04-08 MED ORDER — LIVING WELL WITH DIABETES BOOK
Freq: Once | Status: AC
  Administered 2013-04-08: 16:00:00
  Filled 2013-04-08: qty 1

## 2013-04-08 MED ORDER — GLUCOSE BLOOD VI STRP: ORAL_STRIP | Status: DC

## 2013-04-08 MED ORDER — INSULIN NPH ISOPHANE & REGULAR (70-30) 100 UNIT/ML ~~LOC~~ SUSP: 13.0000 [IU] | Freq: Two times a day (BID) | SUBCUTANEOUS | Status: DC

## 2013-04-08 MED ORDER — ACCU-CHEK MULTICLIX LANCETS MISC: Status: DC

## 2013-04-08 MED ORDER — HYDROCODONE-ACETAMINOPHEN 5-325 MG PO TABS: 2.0000 | ORAL_TABLET | Freq: Four times a day (QID) | ORAL | Status: DC | PRN

## 2013-04-08 MED ORDER — CIPROFLOXACIN HCL 500 MG PO TABS: 500.0000 mg | ORAL_TABLET | Freq: Two times a day (BID) | ORAL | Status: DC

## 2013-04-08 NOTE — Discharge Summary (Signed)
Physician Discharge Summary  [name] NWG:[id number] DOB: [birth year] DOA: 04/05/2013  [facility] Admit date: 04/05/2013 Discharge date: 04/08/2013  Time spent: 40 minutes  Recommendations for Outpatient Follow-up:  1. Home with out[facility]. appt scheduled  Discharge Diagnoses:  Principal Problem: Acute Pyelonephritis  Active Problems:   Type II or unspecified type diabetes mellitus without mention of complication, uncontrolled   GERD (gastroesophageal reflux disease)   RLQ abdominal pain   Obesity    Discharge Condition: fair  Diet recommendation: diabetic  Filed Weights   04/05/13 2011 04/06/13 0428  Weight: 148.326 kg (327 lb) 147.1 kg (324 lb 4.8 oz)    History of present illness:  Please refer to admission H&P for details, but in brief, 42 year old obese female with history of uncontrolled diabetes admitted with nausea with right lower quadrant and right flank pain for 3 days in the setting of UTI with pyelonephritis.   Hospital Course:  Acute pyelonephritis  Urine culture growing mixed bacteria. CT abdomen and pelvis on admission unremarkable.  Placed On empiric Rocephinand symptoms improving. Has occasional right lower quadrant pain.  Leukocytosis resolved.  We'll f discharge her on oral ciprofloxacin 500 mg bid for a 2 week course.  Uncontrolled diabetes mellitus  A1c of 11.6. Patient has not seek medical attention for several months to lack of insurance. She will be discharged on  NovoLog 13 units twice a day. Will increase her home metformin to 1000 mg bid. . I have arranged for an outpatient followup at the [facility] upon discharge. Has appt on 05/14/12.. Prescribed glucometer and test strips. Counseled on the importance of diet control, medication compliance and exercise to lose weight.   GERD  Continue PPI   Obesity  Encouraged to diet restriction, exercise and lose  weight    Family Communication: none at bedside  Disposition Plan: home  Consultants:  None   Procedures:  None   Antibiotics:  IV rocephin ( 12/20__12/22)      Discharge Exam: Filed Vitals:   04/08/13 1420  BP: 109/53  Pulse: 86  Temp: 97.7 F (36.5 C)  Resp: 18    General: middle aged obese female in no acute distress  HEENT: No pallor, moist oral mucosa  Chest: Clear to auscultation bilaterally, no added sounds  CVS: Normal S1-S2, no murmurs or gallop  Abdomen: Soft, nondistended, bowel sounds present, improved right lower quadrant tenderness  Extremities: Warm, no edema  CNS: AAO x3    Discharge Instructions  Discharge Orders   Future Appointments Provider Department Dept Phone   04/30/2013 2:30 PM Chw-Chww Financial Counselor [facility] [phone]   05/14/2013 3:45 PM Doris Cheadle, MD [facility] [phone]   Future Orders Complete By Expires   Ambulatory referral to Nutrition and Diabetic Education  As directed    Comments:     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  Please make sure patient's appointment is set for after January 8th.  Will see her [facility] PCP at [facility] on January 27th.  Discharged home new to insulin.   For home use only DME Glucometer  As directed        Medication List    STOP taking these medications       cephALEXin 500 MG capsule  Commonly known as:  KEFLEX     ibuprofen 400 MG tablet  Commonly known as:  ADVIL,MOTRIN      TAKE these medications       accu-chek multiclix lancets  Use as instructed     acetaminophen 500 MG tablet  Commonly known as:  TYLENOL  Take 1,000 mg by mouth every 6 (six) hours as needed. For pain     ciprofloxacin 500 MG tablet  Commonly known as:  CIPRO  Take 1 tablet (500 mg total) by mouth 2 (two) times daily.  Until 12/29     furosemide 20 MG tablet  Commonly known as:  LASIX  Take 1 tablet (20 mg total) by mouth daily.     glucose blood test strip  Commonly known as:  BL TEST STRIP PACK  Use as instructed     HYDROcodone-acetaminophen 5-325 MG per tablet  Commonly known as:  NORCO/VICODIN  Take 2 tablets by mouth every 6 (six) hours as needed.     insulin NPH-regular (70-30) 100 UNIT/ML injection  Commonly known as:  NOVOLIN 70/30  Inject 13 Units into the skin 2 (two) times daily with a meal.     metFORMIN 500 MG tablet  Commonly known as:  GLUCOPHAGE  Take 2 tablets (1,000 mg total) by mouth 2 (two) times daily with a meal.     omeprazole 20 MG tablet  Commonly known as:  PRILOSEC OTC  Take 20 mg by mouth daily.     ondansetron 8 MG disintegrating tablet  Commonly known as:  ZOFRAN ODT  8mg  ODT q4 hours prn nausea       Allergies  Allergen Reactions  . Vancomycin     Red man syndrome  . Adhesive [Tape] Itching and Rash       Follow-up Information   Follow up with [facility]     On 05/14/2013. (3:45pm with Dr Orpah Cobb)    Contact information:   [address] [place] [address] [phone]       The results of significant diagnostics from this hospitalization (including imaging, microbiology, ancillary and laboratory) are listed below for reference.    Significant Diagnostic Studies: Ct Abdomen Pelvis W Contrast  04/05/2013   CLINICAL DATA:  Right lower quadrant pain.  EXAM: CT ABDOMEN AND PELVIS WITH CONTRAST  TECHNIQUE: Multidetector CT imaging of the abdomen and pelvis was performed using the standard protocol following bolus administration of intravenous contrast.  CONTRAST:  50mL OMNIPAQUE IOHEXOL 300 MG/ML SOLN, OMNIPAQUE IOHEXOL 300 MG/ML SOLN  COMPARISON:  10/20/2011  FINDINGS: BODY WALL: Unremarkable.  LOWER CHEST: Unremarkable.  ABDOMEN/PELVIS:  Liver: Diffuse fatty infiltration of the liver.  Biliary:  Cholecystectomy.  Pancreas: Unremarkable.  Spleen: Unremarkable.  Adrenals: Unremarkable.  Kidneys and ureters: Small peripelvic cysts bilaterally. No hydronephrosis or nephrolithiasis.  Bladder: Decompressed.  Reproductive: Bilateral tubal ligation.  Bowel: No obstruction. Normal appendix.  Retroperitoneum: Enlarged porta hepatis and portacaval lymph nodes, likely reactive to the hepatic steatosis.  Peritoneum: No free fluid or gas.  Vascular: No acute abnormality.  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  IMPRESSION: 1. No evidence of acute intra-abdominal disease.  Normal appendix. 2. Fatty liver.   Electronically Signed   By: Tiburcio Pea M.D.   On: 04/05/2013 23:17   Dg Abd Acute W/chest  04/02/2013   CLINICAL DATA:  Right parotid abdominal pain.  EXAM: ACUTE ABDOMEN SERIES (ABDOMEN 2 VIEW & CHEST 1 VIEW)  COMPARISON:  02/10/2013 chest x-ray  FINDINGS: There  is no evidence of dilated bowel loops or free intraperitoneal air. No radiopaque calculi or other significant radiographic abnormality is seen. Cholecystectomy and tubal ligation. Heart size and mediastinal contours are within normal limits. Both lungs are clear.  IMPRESSION: Negative abdominal radiographs.  No acute cardiopulmonary disease.   Electronically Signed   By: Tiburcio Pea M.D.   On: 04/02/2013 23:22    Microbiology: Recent Results (from the past 240 hour(s))  URINE CULTURE     Status: None   Collection Time    04/02/13 10:05 PM      Result Value Range Status   Specimen Description URINE, CLEAN CATCH   Final   Special Requests NONE   Final   Culture  Setup Time     Final   Value: 04/03/2013 08:35     Performed at [facility] Count     Final   Value: >=100,000 COLONIES/ML     Performed at [facility]   Culture     Final   Value: Multiple bacterial morphotypes present, none predominant. Suggest appropriate recollection if clinically indicated.     Performed at [facility]    Report Status 04/04/2013 FINAL   Final  URINE CULTURE     Status: None   Collection Time    04/05/13  8:17 PM      Result Value Range Status   Specimen Description URINE, CLEAN CATCH   Final   Special Requests NONE   Final   Culture  Setup Time     Final   Value: 04/05/2013 23:03     Performed at [facility] Count     Final   Value: >=100,000 COLONIES/ML     Performed at [facility]   Culture     Final   Value: Multiple bacterial morphotypes present, none predominant. Suggest appropriate recollection if clinically indicated.     Performed at [facility]   Report Status 04/06/2013 FINAL   Final  WET PREP, GENITAL     Status: Abnormal   Collection Time    04/05/13 10:08 PM      Result Value Range Status   Yeast Wet Prep HPF POC NONE SEEN  NONE SEEN Final   Trich, Wet Prep NONE SEEN  NONE SEEN Final   Clue Cells Wet Prep HPF POC NONE SEEN  NONE SEEN Final   WBC, Wet Prep HPF POC FEW (*) NONE SEEN Final  GC/CHLAMYDIA PROBE AMP     Status: None   Collection Time    04/05/13 10:08 PM      Result Value Range Status   CT Probe RNA NEGATIVE  NEGATIVE Final   GC Probe RNA NEGATIVE  NEGATIVE Final   Comment: (NOTE)                                                                                               **Normal Reference Range: Negative**          Assay performed using the Gen-Probe APTIMA COMBO2 (R) Assay.     Acceptable specimen types  for this assay include APTIMA Swabs (Unisex,     endocervical, urethral, or vaginal), first void urine, and ThinPrep     liquid based cytology samples.     Performed at [facility]: Basic Metabolic Panel:  Recent Labs Lab 04/02/13 2213 04/05/13 2054 04/07/13 0551  NA 134* 136 138  K 3.7 3.7 3.9  CL 98 97 103  CO2 24 27 23   GLUCOSE 316* 364* 181*  BUN 9 7 6   CREATININE 0.60 0.70 0.63  CALCIUM 9.5 9.1 8.3*   Liver Function Tests:  Recent Labs Lab 04/02/13 2213 04/05/13 2054   AST 15 17  ALT 19 17  ALKPHOS 117 112  BILITOT 0.4 0.5  PROT 7.8 7.5  ALBUMIN 3.3* 3.3*   No results found for this basename: LIPASE, AMYLASE,  in the last 168 hours No results found for this basename: AMMONIA,  in the last 168 hours CBC:  Recent Labs Lab 04/02/13 2213 04/05/13 2054 04/07/13 0551  WBC 13.1* 11.6* 9.1  NEUTROABS  --  7.8*  --   HGB 13.7 13.1 12.4  HCT 41.0 40.0 38.0  MCV 81.0 82.1 84.6  PLT 365 315 313   Cardiac Enzymes: No results found for this basename: CKTOTAL, CKMB, CKMBINDEX, TROPONINI,  in the last 168 hours BNP: BNP (last 3 results) No results found for this basename: PROBNP,  in the last 8760 hours CBG:  Recent Labs Lab 04/07/13 1219 04/07/13 1709 04/07/13 2224 04/08/13 0816 04/08/13 1223  GLUCAP 234* 221* 182* 145* 204*       Signed:  Kamin Niblack  [facility] 04/08/2013, 3:12 PM

## 2013-04-08 NOTE — Progress Notes (Signed)
Vassie Moment to be D/C'd Home per MD order. Pt received extensive diabetes education from RN and Diabetes educator.  Discussed with the patient and all questions fully answered.    Medication List    STOP taking these medications       cephALEXin 500 MG capsule  Commonly known as:  KEFLEX     ibuprofen 400 MG tablet  Commonly known as:  ADVIL,MOTRIN      TAKE these medications       accu-chek multiclix lancets  Use as instructed     acetaminophen 500 MG tablet  Commonly known as:  TYLENOL  Take 1,000 mg by mouth every 6 (six) hours as needed. For pain     ciprofloxacin 500 MG tablet  Commonly known as:  CIPRO  Take 1 tablet (500 mg total) by mouth 2 (two) times daily.     furosemide 20 MG tablet  Commonly known as:  LASIX  Take 1 tablet (20 mg total) by mouth daily.     glucose blood test strip  Commonly known as:  BL TEST STRIP PACK  Use as instructed     HYDROcodone-acetaminophen 5-325 MG per tablet  Commonly known as:  NORCO/VICODIN  Take 2 tablets by mouth every 6 (six) hours as needed.     insulin NPH-regular (70-30) 100 UNIT/ML injection  Commonly known as:  NOVOLIN 70/30  Inject 13 Units into the skin 2 (two) times daily with a meal.     metFORMIN 500 MG tablet  Commonly known as:  GLUCOPHAGE  Take 2 tablets (1,000 mg total) by mouth 2 (two) times daily with a meal.     omeprazole 20 MG tablet  Commonly known as:  PRILOSEC OTC  Take 20 mg by mouth daily.     ondansetron 8 MG disintegrating tablet  Commonly known as:  ZOFRAN ODT  8mg  ODT q4 hours prn nausea        VVS, Skin clean, dry and intact without evidence of skin break down, no evidence of skin tears noted. IV catheter discontinued intact. Site without signs and symptoms of complications. Dressing and pressure applied.  An After Visit Summary and all diabetic education materials were printed and given to the patient. Pt awaiting ride to be discharged home. Will monitor until  then.  Driggers, Rae Roam 04/08/2013 3:35 PM

## 2013-04-08 NOTE — Progress Notes (Addendum)
Inpatient Diabetes Program Recommendations  AACE/ADA: New Consensus Statement on Inpatient Glycemic Control (2013)  Target Ranges:  Prepandial:   less than 140 mg/dL      Peak postprandial:   less than 180 mg/dL (1-2 hours)      Critically ill patients:  140 - 180 mg/dL   Reason for Visit: Physician referral-- Uncontrolled diabetes being discharged home on insulin and needs OP Diabetes Education  Note:  Patient has watched all diabetes videos.  Has prepared and given insulin.  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  Expresses confidence in ability to prepare and give own insulin at home.  Patient asking appropriate questions.  Discussed signs/symptoms of hypoglycemia and appropriate treatment.  Discussed meal planning questions-- explained the plate method approach to meal planning.  Will ask nursing staff to give handouts to reinforce instruction.    Gave patient website addresses for the American Association of Diabetes Educators, The American Diabetes Association, and [employer] Program through [employer].  Made patient aware of generic insulins available at [employer]-- Novolin/ReliOn R, 70/30, and N.    If patient is to take NPH instead of Lantus 15 units daily, would potentially need 20% higher total dose split into 2 injections-- NPH 9 units at breakfast and  NPH 9 units at HS.   If patient is to take 70/30 instead of Lantus 15 units, would potentially need 70/30 13 units before breakfast and 70/30 13 units before supper.  Patient has called the [facility] and [employer].  Has an appointment on January 27th.  Plans to "walk in" on January 8th to apply and receive her [employer].  Placed referral to the [facility] for outpatient diabetes education follow-up.  Will ask that they make sure her appointment is after January 8th so instruction will be covered by  the [facility].    Thank you for the consult.  Tekia Waterbury S. Elsie Lincoln, RN, CNS, CDE Inpatient Diabetes Program, team pager [phone]   Did not give website for American Association of Diabetes Educators as this site is not patient-oriented.

## 2013-04-08 NOTE — Progress Notes (Signed)
Started diabetic education with patient. Patient began viewing diabetic videos and I taught her how to draw up insulin and inject herself. She demonstrated this for me with her lunch insulin. I ordered an insulin starter kit and explained contents to her. Diabetic educator to reinforce education later this afternoon.   Driggers, [employer]

## 2013-04-08 NOTE — Care Management Note (Signed)
  Page 1 of 1   04/08/2013     10:55:35 AM   CARE MANAGEMENT NOTE 04/08/2013  Patient:  [name], [name]   Account Number:  [id number]  Date Initiated:  04/08/2013  Documentation initiated by:  Ronny Flurry  Subjective/Objective Assessment:     Action/Plan:   Anticipated DC Date:  04/08/2013   Anticipated DC Plan:  HOME/SELF CARE      DC Planning Services  CM consult  [facility]  [facility]      Choice offered to / List presented to:             Status of service:   Medicare Important Message given?   (If response is "NO", the following Medicare IM given date fields will be blank) Date Medicare IM given:   Date Additional Medicare IM given:    Discharge Disposition:    Per UR Regulation:    If discussed at Long Length of Stay Meetings, dates discussed:    Comments: 04-08-13 Consult for Glucometer, patient will have to purchase Glucometer on her own . Ronny Flurry RN BSN    04-08-13 MATCH letter given and explained . Patient voiced understanding . Also provided patient with [facility] address and phone number . Patinet stated she will call and make follow up appointment . Ronny Flurry RN BSN [phone]

## 2013-04-15 ENCOUNTER — Encounter (HOSPITAL_BASED_OUTPATIENT_CLINIC_OR_DEPARTMENT_OTHER): Payer: Self-pay | Admitting: Emergency Medicine

## 2013-04-15 ENCOUNTER — Emergency Department (HOSPITAL_BASED_OUTPATIENT_CLINIC_OR_DEPARTMENT_OTHER)
Admission: EM | Admit: 2013-04-15 | Discharge: 2013-04-16 | Disposition: A | Discharge status: Home / Self Care | Payer: No Typology Code available for payment source | Attending: Emergency Medicine | Admitting: Emergency Medicine

## 2013-04-15 DIAGNOSIS — Z87891 Personal history of nicotine dependence: Secondary | ICD-10-CM | POA: Insufficient documentation

## 2013-04-15 DIAGNOSIS — R509 Fever, unspecified: Secondary | ICD-10-CM | POA: Insufficient documentation

## 2013-04-15 DIAGNOSIS — R109 Unspecified abdominal pain: Secondary | ICD-10-CM

## 2013-04-15 DIAGNOSIS — R110 Nausea: Secondary | ICD-10-CM | POA: Insufficient documentation

## 2013-04-15 DIAGNOSIS — E119 Type 2 diabetes mellitus without complications: Secondary | ICD-10-CM | POA: Insufficient documentation

## 2013-04-15 DIAGNOSIS — K219 Gastro-esophageal reflux disease without esophagitis: Secondary | ICD-10-CM | POA: Insufficient documentation

## 2013-04-15 DIAGNOSIS — Z79899 Other long term (current) drug therapy: Secondary | ICD-10-CM | POA: Insufficient documentation

## 2013-04-15 DIAGNOSIS — Z872 Personal history of diseases of the skin and subcutaneous tissue: Secondary | ICD-10-CM | POA: Insufficient documentation

## 2013-04-15 DIAGNOSIS — Z792 Long term (current) use of antibiotics: Secondary | ICD-10-CM | POA: Insufficient documentation

## 2013-04-15 DIAGNOSIS — R1031 Right lower quadrant pain: Secondary | ICD-10-CM | POA: Insufficient documentation

## 2013-04-15 DIAGNOSIS — Z794 Long term (current) use of insulin: Secondary | ICD-10-CM | POA: Insufficient documentation

## 2013-04-15 LAB — CBC WITH DIFFERENTIAL/PLATELET
Basophils Absolute: 0 10*3/uL (ref 0.0–0.1)
Basophils Relative: 0 % (ref 0–1)
Eosinophils Absolute: 0.4 10*3/uL (ref 0.0–0.7)
Eosinophils Relative: 4 % (ref 0–5)
HCT: 39.4 % (ref 36.0–46.0)
Hemoglobin: 12.8 g/dL (ref 12.0–15.0)
Lymphocytes Relative: 29 % (ref 12–46)
Lymphs Abs: 3.1 10*3/uL (ref 0.7–4.0)
MCH: 27 pg (ref 26.0–34.0)
MCHC: 32.5 g/dL (ref 30.0–36.0)
MCV: 83.1 fL (ref 78.0–100.0)
Monocytes Absolute: 0.9 10*3/uL (ref 0.1–1.0)
Monocytes Relative: 9 % (ref 3–12)
Neutro Abs: 6.1 10*3/uL (ref 1.7–7.7)
Neutrophils Relative %: 58 % (ref 43–77)
Platelets: 325 10*3/uL (ref 150–400)
RBC: 4.74 MIL/uL (ref 3.87–5.11)
RDW: 14 % (ref 11.5–15.5)
WBC: 10.4 10*3/uL (ref 4.0–10.5)

## 2013-04-15 LAB — URINALYSIS, ROUTINE W REFLEX MICROSCOPIC
Bilirubin Urine: NEGATIVE
Glucose, UA: NEGATIVE mg/dL
Hgb urine dipstick: NEGATIVE
Ketones, ur: NEGATIVE mg/dL
Nitrite: NEGATIVE
Protein, ur: NEGATIVE mg/dL
Specific Gravity, Urine: 1.02 (ref 1.005–1.030)
Urobilinogen, UA: 0.2 mg/dL (ref 0.0–1.0)
pH: 5.5 (ref 5.0–8.0)

## 2013-04-15 LAB — COMPREHENSIVE METABOLIC PANEL
ALT: 19 U/L (ref 0–35)
AST: 19 U/L (ref 0–37)
Albumin: 3.1 g/dL — ABNORMAL LOW (ref 3.5–5.2)
Alkaline Phosphatase: 107 U/L (ref 39–117)
BUN: 12 mg/dL (ref 6–23)
CO2: 25 mEq/L (ref 19–32)
Calcium: 9.1 mg/dL (ref 8.4–10.5)
Chloride: 101 mEq/L (ref 96–112)
Creatinine, Ser: 0.7 mg/dL (ref 0.50–1.10)
GFR calc Af Amer: >90 mL/min (ref >90)
GFR calc non Af Amer: >90 mL/min (ref >90)
Glucose, Bld: 164 mg/dL — ABNORMAL HIGH (ref 70–99)
Potassium: 3.9 mEq/L (ref 3.5–5.1)
Sodium: 137 mEq/L (ref 135–145)
Total Bilirubin: 0.2 mg/dL — ABNORMAL LOW (ref 0.3–1.2)
Total Protein: 7.2 g/dL (ref 6.0–8.3)

## 2013-04-15 LAB — URINE MICROSCOPIC-ADD ON

## 2013-04-15 LAB — LIPASE, BLOOD: Lipase: 66 U/L — ABNORMAL HIGH (ref 11–59)

## 2013-04-15 NOTE — ED Provider Notes (Signed)
CSN: [id number]     Arrival date & time 04/15/13  1900 History   First MD Initiated Contact with Patient 04/15/13 2228     Chief Complaint  Patient presents with  . Abdominal Pain   (Consider location/radiation/quality/duration/timing/severity/associated sxs/prior Treatment) Patient is a 42 y.o. female presenting with abdominal pain. The history is provided by the patient. No language interpreter was used.  Abdominal Pain Pain location:  RLQ Pain quality: stabbing   Pain radiates to:  RLQ Pain severity:  Moderate Onset quality:  Gradual Duration:  2 weeks Timing:  Constant Chronicity:  New Relieved by:  Nothing Worsened by:  Nothing tried Ineffective treatments:  None tried Associated symptoms: chills, fever and nausea   Associated symptoms: no vaginal discharge     Past Medical History  Diagnosis Date  . Diabetes mellitus   . GERD (gastroesophageal reflux disease)   . Cellulitis 07/2011   Past Surgical History  Procedure Laterality Date  . Cholecystectomy    . Knee arthroscopy    . Laparoscopic tubal ligation  04/29/2011    Procedure: LAPAROSCOPIC TUBAL LIGATION;  Surgeon: Turner Daniels, MD;  Location: [facility];  Service: Gynecology;  Laterality: Bilateral;  with Filshie Clips  . Ablation     No family history on file. History  Substance Use Topics  . Smoking status: Former Smoker -- 0.50 packs/day    Types: Cigarettes    Quit date: 07/22/2011  . Smokeless tobacco: Never Used  . Alcohol Use: Yes     Comment: occasional   OB History   Grav Para Term Preterm Abortions TAB SAB Ect Mult Living                 Review of Systems  Constitutional: Positive for fever and chills.  Gastrointestinal: Positive for nausea and abdominal pain.  Genitourinary: Negative for vaginal discharge.  All other systems reviewed and are negative.    Allergies  Vancomycin and Adhesive  Home Medications   Current Outpatient Rx  Name  Route  Sig  Dispense  Refill  . acetaminophen  (TYLENOL) 500 MG tablet   Oral   Take 1,000 mg by mouth every 6 (six) hours as needed. For pain         . ciprofloxacin (CIPRO) 500 MG tablet   Oral   Take 1 tablet (500 mg total) by mouth 2 (two) times daily.   14 tablet   0   . furosemide (LASIX) 20 MG tablet   Oral   Take 1 tablet (20 mg total) by mouth daily.   30 tablet   0   . glucose blood (BL TEST STRIP PACK) test strip      Use as instructed   100 each   12   . HYDROcodone-acetaminophen (NORCO/VICODIN) 5-325 MG per tablet   Oral   Take 2 tablets by mouth every 6 (six) hours as needed.   30 tablet   0   . insulin NPH-regular (NOVOLIN 70/30) (70-30) 100 UNIT/ML injection   Subcutaneous   Inject 13 Units into the skin 2 (two) times daily with a meal.   10 mL   12   . Lancets (ACCU-CHEK MULTICLIX) lancets      Use as instructed   100 each   12   . metFORMIN (GLUCOPHAGE) 500 MG tablet   Oral   Take 2 tablets (1,000 mg total) by mouth 2 (two) times daily with a meal.   60 tablet   2   . omeprazole (  PRILOSEC OTC) 20 MG tablet   Oral   Take 20 mg by mouth daily.         . ondansetron (ZOFRAN ODT) 8 MG disintegrating tablet      8mg  ODT q4 hours prn nausea   4 tablet   0    BP 117/94  Pulse 86  Temp(Src) 97.5 F (36.4 C) (Oral)  Resp 16  Ht 5\' 1"  (1.549 m)  Wt 327 lb (148.326 kg)  BMI 61.82 kg/m2  SpO2 100% Physical Exam  Nursing note and vitals reviewed. Constitutional: She is oriented to person, place, and time. She appears well-developed and well-nourished.  HENT:  Head: Normocephalic.  Eyes: Pupils are equal, round, and reactive to light.  Neck: Normal range of motion.  Cardiovascular: Normal rate and regular rhythm.   Pulmonary/Chest: Effort normal and breath sounds normal.  Abdominal: Soft. There is tenderness. There is guarding.  Musculoskeletal: Normal range of motion.  Neurological: She is alert and oriented to person, place, and time.  Skin: Skin is warm.  Psychiatric: She  has a normal mood and affect.    ED Course  Procedures (including critical care time) Labs Review Labs Reviewed  URINALYSIS, ROUTINE W REFLEX MICROSCOPIC - Abnormal; Notable for the following:    Leukocytes, UA TRACE (*)    All other components within normal limits  COMPREHENSIVE METABOLIC PANEL - Abnormal; Notable for the following:    Glucose, Bld 164 (*)    Albumin 3.1 (*)    Total Bilirubin 0.2 (*)    All other components within normal limits  LIPASE, BLOOD - Abnormal; Notable for the following:    Lipase 66 (*)    All other components within normal limits  URINE MICROSCOPIC-ADD ON  CBC WITH DIFFERENTIAL   Imaging Review No results found.  EKG Interpretation   None     Dr. Manus Gunning in to see and examine.   He advised Ct scan.  Pt's care turned over to Dr. Read Drivers  Ct pending  MDM   1. Abdominal pain        Elson Areas, PA-C 04/16/13 0045  Elson Areas, PA-C 04/16/13 [phone]

## 2013-04-15 NOTE — ED Notes (Signed)
RLQ pain since 12/17. Was admitting to [facility] and DC'd 1 week ago.  Pt sts the RLQ pain and right flank pain are unchanged, constant and burning in nature.

## 2013-04-16 ENCOUNTER — Emergency Department (HOSPITAL_BASED_OUTPATIENT_CLINIC_OR_DEPARTMENT_OTHER): Payer: No Typology Code available for payment source

## 2013-04-16 MED ORDER — IOHEXOL 300 MG/ML  SOLN
100.0000 mL | Freq: Once | INTRAMUSCULAR | Status: AC | PRN
  Administered 2013-04-16: 100 mL via INTRAVENOUS

## 2013-04-16 MED ORDER — IOHEXOL 300 MG/ML  SOLN
50.0000 mL | Freq: Once | INTRAMUSCULAR | Status: AC | PRN
  Administered 2013-04-16: 50 mL via ORAL

## 2013-04-16 MED ORDER — ONDANSETRON HCL 4 MG/2ML IJ SOLN
INTRAMUSCULAR | Status: AC
  Filled 2013-04-16: qty 2

## 2013-04-16 MED ORDER — ONDANSETRON HCL 4 MG/2ML IJ SOLN
4.0000 mg | Freq: Once | INTRAMUSCULAR | Status: AC
  Administered 2013-04-16: 4 mg via INTRAVENOUS

## 2013-04-16 MED ORDER — HYDROCODONE-ACETAMINOPHEN 5-325 MG PO TABS: 2.0000 | ORAL_TABLET | ORAL | Status: DC | PRN

## 2013-04-16 NOTE — ED Provider Notes (Signed)
Nursing notes and vitals signs, including pulse oximetry, reviewed.  Summary of this visit's results, reviewed by myself:  Labs:  Results for orders placed during the hospital encounter of 04/15/13 (from the past 24 hour(s))  URINALYSIS, ROUTINE W REFLEX MICROSCOPIC     Status: Abnormal   Collection Time    04/15/13  7:20 PM      Result Value Range   Color, Urine YELLOW  YELLOW   APPearance CLEAR  CLEAR   Specific Gravity, Urine 1.020  1.005 - 1.030   pH 5.5  5.0 - 8.0   Glucose, UA NEGATIVE  NEGATIVE mg/dL   Hgb urine dipstick NEGATIVE  NEGATIVE   Bilirubin Urine NEGATIVE  NEGATIVE   Ketones, ur NEGATIVE  NEGATIVE mg/dL   Protein, ur NEGATIVE  NEGATIVE mg/dL   Urobilinogen, UA 0.2  0.0 - 1.0 mg/dL   Nitrite NEGATIVE  NEGATIVE   Leukocytes, UA TRACE (*) NEGATIVE  URINE MICROSCOPIC-ADD ON     Status: None   Collection Time    04/15/13  7:20 PM      Result Value Range   Squamous Epithelial / LPF RARE  RARE   WBC, UA 0-2  <3 WBC/hpf   Bacteria, UA RARE  RARE  CBC WITH DIFFERENTIAL     Status: None   Collection Time    04/15/13 11:15 PM      Result Value Range   WBC 10.4  4.0 - 10.5 K/uL   RBC 4.74  3.87 - 5.11 MIL/uL   Hemoglobin 12.8  12.0 - 15.0 g/dL   HCT 64.4  03.4 - 74.2 %   MCV 83.1  78.0 - 100.0 fL   MCH 27.0  26.0 - 34.0 pg   MCHC 32.5  30.0 - 36.0 g/dL   RDW 59.5  63.8 - 75.6 %   Platelets 325  150 - 400 K/uL   Neutrophils Relative % 58  43 - 77 %   Neutro Abs 6.1  1.7 - 7.7 K/uL   Lymphocytes Relative 29  12 - 46 %   Lymphs Abs 3.1  0.7 - 4.0 K/uL   Monocytes Relative 9  3 - 12 %   Monocytes Absolute 0.9  0.1 - 1.0 K/uL   Eosinophils Relative 4  0 - 5 %   Eosinophils Absolute 0.4  0.0 - 0.7 K/uL   Basophils Relative 0  0 - 1 %   Basophils Absolute 0.0  0.0 - 0.1 K/uL  COMPREHENSIVE METABOLIC PANEL     Status: Abnormal   Collection Time    04/15/13 11:15 PM      Result Value Range   Sodium 137  135 - 145 mEq/L   Potassium 3.9  3.5 - 5.1 mEq/L   Chloride  101  96 - 112 mEq/L   CO2 25  19 - 32 mEq/L   Glucose, Bld 164 (*) 70 - 99 mg/dL   BUN 12  6 - 23 mg/dL   Creatinine, Ser 4.33  0.50 - 1.10 mg/dL   Calcium 9.1  8.4 - 29.5 mg/dL   Total Protein 7.2  6.0 - 8.3 g/dL   Albumin 3.1 (*) 3.5 - 5.2 g/dL   AST 19  0 - 37 U/L   ALT 19  0 - 35 U/L   Alkaline Phosphatase 107  39 - 117 U/L   Total Bilirubin 0.2 (*) 0.3 - 1.2 mg/dL   GFR calc non Af Amer >90  >90 mL/min   GFR calc Af Amer >  90  >90 mL/min  LIPASE, BLOOD     Status: Abnormal   Collection Time    04/15/13 11:15 PM      Result Value Range   Lipase 66 (*) 11 - 59 U/L    Imaging Studies: Ct Abdomen Pelvis W Contrast  04/16/2013   CLINICAL DATA:  Right lower quadrant pain and nausea.  EXAM: CT ABDOMEN AND PELVIS WITH CONTRAST  TECHNIQUE: Multidetector CT imaging of the abdomen and pelvis was performed using the standard protocol following bolus administration of intravenous contrast.  CONTRAST:  50mL OMNIPAQUE IOHEXOL 300 MG/ML SOLN, OMNIPAQUE IOHEXOL 300 MG/ML SOLN  COMPARISON:  04/05/2013  FINDINGS: The lung bases are clear.  The liver, spleen, pancreas, adrenal glands, kidneys, abdominal aorta, inferior vena cava, and retroperitoneal lymph nodes are unremarkable. Surgical absence of the gallbladder. The stomach, small bowel, and colon are not abnormally distended. Stool fills the colon. No free air or free fluid in the abdomen. Abdominal wall musculature appears intact.  Pelvis: Bladder wall is not thickened. Uterus and ovaries are not enlarged. Surgical clips consistent with tubal ligations. Appendix is normal. No free or loculated pelvic fluid collections. No evidence of diverticulitis. The from Mild degenerative changes in the spine. No destructive bone lesions. No significant changes since the prior study.  IMPRESSION: No acute process demonstrated in the abdomen or pelvis.   Electronically Signed   By: Burman Nieves M.D.   On: 04/16/2013 01:52      Hanley Seamen,  MD 04/16/13 [phone]

## 2013-04-16 NOTE — ED Provider Notes (Signed)
Medical screening examination/treatment/procedure(s) were conducted as a shared visit with non-physician practitioner(s) and myself.  I personally evaluated the patient during the encounter.  2 weeks of ongoing RLQ pain with nausea.  Recent admission for pyelonephritis. TTP RLQ with guarding.  EKG Interpretation   None        Glynn Octave, MD 04/16/13 [phone]

## 2013-04-30 ENCOUNTER — Ambulatory Visit: Payer: Self-pay

## 2013-05-14 ENCOUNTER — Encounter: Payer: Self-pay | Admitting: Internal Medicine

## 2013-05-14 ENCOUNTER — Ambulatory Visit: Payer: No Typology Code available for payment source | Attending: Internal Medicine | Admitting: Internal Medicine

## 2013-05-14 VITALS — BP 130/92 | HR 122 | Temp 98.4°F | Resp 16 | Ht 61.0 in | Wt 328.0 lb

## 2013-05-14 DIAGNOSIS — E119 Type 2 diabetes mellitus without complications: Secondary | ICD-10-CM | POA: Insufficient documentation

## 2013-05-14 DIAGNOSIS — K219 Gastro-esophageal reflux disease without esophagitis: Secondary | ICD-10-CM | POA: Insufficient documentation

## 2013-05-14 DIAGNOSIS — F17200 Nicotine dependence, unspecified, uncomplicated: Secondary | ICD-10-CM

## 2013-05-14 DIAGNOSIS — Z139 Encounter for screening, unspecified: Secondary | ICD-10-CM

## 2013-05-14 LAB — GLUCOSE, POCT (MANUAL RESULT ENTRY): POC Glucose: 224 mg/dl — AB (ref 70–99)

## 2013-05-14 MED ORDER — FUROSEMIDE 20 MG PO TABS: 20.0000 mg | ORAL_TABLET | Freq: Every day | ORAL | Status: DC

## 2013-05-14 NOTE — Progress Notes (Signed)
Patient Demographics  [name], is a 43 y.o. female  XBJ:[id number]  HYQ:[id number]  DOB - [birth year]  CC: No chief complaint on file.      HPI: [name] is a 43 y.o. female here today to establish medical care. She has history of diabetes, last month she was hospitalized, she was not compliant with her diabetes medication at that point, she has been resumed back on Novolin 70/30 which she has been taking 13 units twice a day and also was started on metformin, patient reports improvement in her fasting sugars. Patient has No headache, No chest pain, No abdominal pain - No Nausea, No new weakness tingling or numbness, No Cough - SOB.  Allergies  Allergen Reactions  . Vancomycin     Red man syndrome  . Adhesive [Tape] Itching and Rash   Past Medical History  Diagnosis Date  . Diabetes mellitus   . GERD (gastroesophageal reflux disease)   . Cellulitis 07/2011   Current Outpatient Prescriptions on File Prior to Visit  Medication Sig Dispense Refill  . acetaminophen (TYLENOL) 500 MG tablet Take 1,000 mg by mouth every 6 (six) hours as needed. For pain      . furosemide (LASIX) 20 MG tablet Take 1 tablet (20 mg total) by mouth daily.  30 tablet  0  . glucose blood (BL TEST STRIP PACK) test strip Use as instructed  100 each  12  . insulin NPH-regular (NOVOLIN 70/30) (70-30) 100 UNIT/ML injection Inject 13 Units into the skin 2 (two) times daily with a meal.  10 mL  12  . Lancets (ACCU-CHEK MULTICLIX) lancets Use as instructed  100 each  12  . metFORMIN (GLUCOPHAGE) 500 MG tablet Take 2 tablets (1,000 mg total) by mouth 2 (two) times daily with a meal.  60 tablet  2  . omeprazole (PRILOSEC OTC) 20 MG tablet Take 20 mg by mouth daily.      . ciprofloxacin (CIPRO) 500 MG tablet Take 1 tablet (500 mg total) by mouth 2 (two) times daily.  14 tablet  0  . HYDROcodone-acetaminophen (NORCO/VICODIN) 5-325 MG per tablet Take 2 tablets by mouth every 6 (six) hours as needed.  30  tablet  0  . HYDROcodone-acetaminophen (NORCO/VICODIN) 5-325 MG per tablet Take 2 tablets by mouth every 4 (four) hours as needed.  20 tablet  0  . ondansetron (ZOFRAN ODT) 8 MG disintegrating tablet 8mg  ODT q4 hours prn nausea  4 tablet  0   No current facility-administered medications on file prior to visit.   Family History  Problem Relation Age of Onset  . Cancer Mother   . Diabetes Mother   . Hypertension Mother    History   Social History  . Marital Status: Married    Spouse Name: N/A    Number of Children: N/A  . Years of Education: N/A   Occupational History  . Not on file.   Social History Main Topics  . Smoking status: Former Smoker -- 0.50 packs/day for 2 years    Types: Cigarettes    Quit date: 07/22/2011  . Smokeless tobacco: Never Used  . Alcohol Use: Yes     Comment: occasional  . Drug Use: No  . Sexual Activity: Yes    Birth Control/ Protection: Surgical   Other Topics Concern  . Not on file   Social History Narrative  . No narrative on file    Review of Systems: Constitutional: Negative for fever, chills, diaphoresis, activity change, appetite change and  fatigue. HENT: Negative for ear pain, nosebleeds, congestion, facial swelling, rhinorrhea, neck pain, neck stiffness and ear discharge.  Eyes: Negative for pain, discharge, redness, itching and visual disturbance. Respiratory: Negative for cough, choking, chest tightness, shortness of breath, wheezing and stridor.  Cardiovascular: Negative for chest pain, palpitations and leg swelling. Gastrointestinal: Negative for abdominal distention. Genitourinary: Negative for dysuria, urgency, frequency, hematuria, flank pain, decreased urine volume, difficulty urinating and dyspareunia.  Musculoskeletal: Negative for back pain, joint swelling, arthralgia and gait problem. Neurological: Negative for dizziness, tremors, seizures, syncope, facial asymmetry, speech difficulty, weakness, light-headedness, numbness  and headaches.  Hematological: Negative for adenopathy. Does not bruise/bleed easily. Psychiatric/Behavioral: Negative for hallucinations, behavioral problems, confusion, dysphoric mood, decreased concentration and agitation.    Objective:   Filed Vitals:   05/14/13 1551  BP: 130/92  Pulse: 122  Temp: 98.4 F (36.9 C)  Resp: 16    Physical Exam: Constitutional: obese female sitting comfortably not in acute distress. HENT: Normocephalic, atraumatic, External right and left ear normal. Oropharynx is clear and moist.  Eyes: Conjunctivae and EOM are normal. PERRLA, no scleral icterus. Neck: Normal ROM. Neck supple. No JVD. No tracheal deviation. No thyromegaly. CVS: RRR, S1/S2 +, no murmurs, no gallops, no carotid bruit.  Pulmonary: Effort and breath sounds normal, no stridor, rhonchi, wheezes, rales.  Abdominal: Soft. BS +, no distension, tenderness, rebound or guarding.  Neuro: Alert. Normal reflexes, muscle tone coordination. No cranial nerve deficit. Psychiatric: Normal mood and affect. Behavior, judgment, thought content normal.  Lab Results  Component Value Date   WBC 10.4 04/15/2013   HGB 12.8 04/15/2013   HCT 39.4 04/15/2013   MCV 83.1 04/15/2013   PLT 325 04/15/2013   Lab Results  Component Value Date   CREATININE 0.70 04/15/2013   BUN 12 04/15/2013   NA 137 04/15/2013   K 3.9 04/15/2013   CL 101 04/15/2013   CO2 25 04/15/2013    Lab Results  Component Value Date   HGBA1C 11.6* 04/06/2013   Lipid Panel     Component Value Date/Time   CHOL  Value: 144        ATP III CLASSIFICATION:  <200     mg/dL   Desirable  323-557  mg/dL   Borderline High  >=322    mg/dL   High        0/25/4270 0525   TRIG 147 12/30/2008 0525   HDL 32* 12/30/2008 0525   CHOLHDL 4.5 12/30/2008 0525   VLDL 29 12/30/2008 0525   LDLCALC  Value: 83        Total Cholesterol/HDL:CHD Risk Coronary Heart Disease Risk Table                     Men   Women  1/2 Average Risk   3.4   3.3  Average Risk        5.0   4.4  2 X Average Risk   9.6   7.1  3 X Average Risk  23.4   11.0        Use the calculated Patient Ratio above and the CHD Risk Table to determine the patient's CHD Risk.        ATP III CLASSIFICATION (LDL):  <100     mg/dL   Optimal  623-762  mg/dL   Near or Above                    Optimal  130-159  mg/dL   Borderline  160-189  mg/dL   High  >811>190     mg/dL   Very High 9/14/78299/14/2010 [id number]       Assessment and plan:   1. Diabetes Last  HbA1c was 11.6 % Patient was recently resumed back on metformin and taking novolin 70/30 13 units bid  - Glucose (CBG)  2. GERD (gastroesophageal reflux disease) prilosec  3. Smoking Patient is trying to quit smoking   4. Screening  - CBC with Differential; Future - COMPLETE METABOLIC PANEL WITH GFR; Future - TSH; Future - Vit D  25 hydroxy (rtn osteoporosis monitoring); Future  Return in about 2 months (around 07/12/2013).   Doris CheadleADVANI, Jasmynn Pfalzgraf, MD

## 2013-05-14 NOTE — Progress Notes (Signed)
Pt is here to establish care. She is here to manage her diabetes.

## 2013-05-15 ENCOUNTER — Encounter: Payer: No Typology Code available for payment source | Attending: Internal Medicine | Admitting: *Deleted

## 2013-05-15 ENCOUNTER — Encounter: Payer: Self-pay | Admitting: *Deleted

## 2013-05-15 VITALS — Ht 61.0 in | Wt 327.9 lb

## 2013-05-15 DIAGNOSIS — IMO0001 Reserved for inherently not codable concepts without codable children: Secondary | ICD-10-CM | POA: Insufficient documentation

## 2013-05-15 DIAGNOSIS — Z713 Dietary counseling and surveillance: Secondary | ICD-10-CM | POA: Insufficient documentation

## 2013-05-15 DIAGNOSIS — E669 Obesity, unspecified: Secondary | ICD-10-CM | POA: Insufficient documentation

## 2013-05-15 DIAGNOSIS — E1165 Type 2 diabetes mellitus with hyperglycemia: Principal | ICD-10-CM

## 2013-05-15 NOTE — Patient Instructions (Signed)
Plan:  Aim for 3 Carb Choices per meal (45 grams) +/- 1 either way  Aim for 0-2 Carbs per snack if hungry  Consider reading food labels for Total Carbohydrate of foods Continue checking BG before breakfast daily as directed by MD

## 2013-05-15 NOTE — Progress Notes (Signed)
Appt start time: 1500 end time:  1630.  Assessment:  Patient was seen on  05/15/13 for individual diabetes education. She has had diabetes for about 2 years, is now on insulin and Metformin. She SMBG every morning before breakfast. Reported BG before breakfast is 107 - 174 mg/dl. Lives with husband, his son and his girlfriend. She and her husband grocery shop, she does the cooking. She parks farther in the mall parking lot to get exercise. She is taking her insulin consistently since she was in the hospital last December  Current HbA1c: 11.6% on 04/06/13  Preferred Learning Style:   No preference indicated   Learning Readiness:   Ready  Change in progress  MEDICATIONS: see list, diabetes medcations are insulin 70/30 and Metformin  DIETARY INTAKE:  24-hr recall:  B ( AM): used to skip, now will have Cheerios and milk OR 2 fried eggs and 1-2 pieces toast with butter,with sausage or bacon, 2 % milk or regular soda Snk ( AM): fruit, occasionally chips  L ( PM): sandwich with chips, regular soda or water to drink Snk ( PM): same as AM D ( PM): meat, starch, vegetable/salad, occasionally bread, regular soda or water  Snk ( PM): occasionally cookies or Little Debbie cake and milk Beverages: milk, regular soda or water    Usual physical activity: some walking at the mall  Estimated energy needs: 1400 calories 158 g carbohydrates 105 g protein 39 g fat    Intervention:  Nutrition counseling provided.  Discussed diabetes disease process and treatment options.  Discussed physiology of diabetes and role of obesity on insulin resistance.  Encouraged moderate weight reduction to improve glucose levels.  Discussed role of medications and diet in glucose control  Provided education on macronutrients on glucose levels.  Provided education on carb counting, importance of regularly scheduled meals/snacks, and meal planning  Discussed effects of physical activity on glucose levels and long-term  glucose control.  Recommended she consider options for increasing her physical activity/week.  Reviewed patient medications.  Discussed role of medication on blood glucose and possible side effects  Discussed blood glucose monitoring and interpretation.  Discussed recommended target ranges and individual ranges.    Described short-term complications: hyper- and hypo-glycemia.  Discussed causes,symptoms, and treatment options.  Discussed prevention, detection, and treatment of long-term complications.  Discussed the role of prolonged elevated glucose levels on body systems.  Discussed role of stress on blood glucose levels and discussed strategies to manage psychosocial issues.  Provided information on recommendations for long-term diabetes self-care.  Established checklist for medical, dental, and emotional self-care.  Plan:  Aim for 3 Carb Choices per meal (45 grams) +/- 1 either way  Aim for 0-2 Carbs per snack if hungry  Consider reading food labels for Total Carbohydrate of foods Continue checking BG before breakfast daily as directed by MD   Teaching Method Utilized: Visual, Auditory and Hands on  Handouts given during visit include: Living Well with Diabetes Carb Counting and Food Label handouts Meal Plan Card  Barriers to learning/adherence to lifestyle change: none  Diabetes self-care support plan:   [facility] support [facility]  Demonstrated degree of understanding via:  Teach Back   Monitoring/Evaluation:  Dietary intake, exercise, reading food labels, and body weight in 1 month(s). Patient chooses to wait and call for appointment on another date.

## 2013-05-21 ENCOUNTER — Ambulatory Visit: Payer: No Typology Code available for payment source

## 2013-07-15 ENCOUNTER — Ambulatory Visit: Payer: Self-pay | Admitting: Internal Medicine

## 2013-07-28 ENCOUNTER — Encounter (HOSPITAL_BASED_OUTPATIENT_CLINIC_OR_DEPARTMENT_OTHER): Payer: Self-pay | Admitting: Emergency Medicine

## 2013-07-28 ENCOUNTER — Emergency Department (HOSPITAL_BASED_OUTPATIENT_CLINIC_OR_DEPARTMENT_OTHER): Payer: No Typology Code available for payment source

## 2013-07-28 ENCOUNTER — Emergency Department (HOSPITAL_BASED_OUTPATIENT_CLINIC_OR_DEPARTMENT_OTHER)
Admission: EM | Admit: 2013-07-28 | Discharge: 2013-07-29 | Disposition: A | Discharge status: Home / Self Care | Payer: No Typology Code available for payment source | Attending: Emergency Medicine | Admitting: Emergency Medicine

## 2013-07-28 DIAGNOSIS — Z872 Personal history of diseases of the skin and subcutaneous tissue: Secondary | ICD-10-CM | POA: Insufficient documentation

## 2013-07-28 DIAGNOSIS — E119 Type 2 diabetes mellitus without complications: Secondary | ICD-10-CM | POA: Insufficient documentation

## 2013-07-28 DIAGNOSIS — Z792 Long term (current) use of antibiotics: Secondary | ICD-10-CM | POA: Insufficient documentation

## 2013-07-28 DIAGNOSIS — Z96659 Presence of unspecified artificial knee joint: Secondary | ICD-10-CM | POA: Insufficient documentation

## 2013-07-28 DIAGNOSIS — M79673 Pain in unspecified foot: Secondary | ICD-10-CM

## 2013-07-28 DIAGNOSIS — M79609 Pain in unspecified limb: Secondary | ICD-10-CM | POA: Insufficient documentation

## 2013-07-28 DIAGNOSIS — Z87891 Personal history of nicotine dependence: Secondary | ICD-10-CM | POA: Insufficient documentation

## 2013-07-28 DIAGNOSIS — Z79899 Other long term (current) drug therapy: Secondary | ICD-10-CM | POA: Insufficient documentation

## 2013-07-28 DIAGNOSIS — K219 Gastro-esophageal reflux disease without esophagitis: Secondary | ICD-10-CM | POA: Insufficient documentation

## 2013-07-28 DIAGNOSIS — Z794 Long term (current) use of insulin: Secondary | ICD-10-CM | POA: Insufficient documentation

## 2013-07-28 MED ORDER — IBUPROFEN 800 MG PO TABS
800.0000 mg | ORAL_TABLET | Freq: Once | ORAL | Status: AC
  Administered 2013-07-29: 800 mg via ORAL
  Filled 2013-07-28: qty 1

## 2013-07-28 MED ORDER — TRAMADOL HCL 50 MG PO TABS
50.0000 mg | ORAL_TABLET | Freq: Once | ORAL | Status: AC
  Administered 2013-07-29: 50 mg via ORAL
  Filled 2013-07-28: qty 1

## 2013-07-28 NOTE — ED Provider Notes (Signed)
CSN: [id number]     Arrival date & time 07/28/13  2204 History  This chart was scribed for Georgana Romain Smitty CordsK Ashlay Altieri-Rasch, MD by Beverly MilchJ Harrison Collins, ED Scribe. This patient was seen in room MH11/MH11 and the patient's care was started at 11:19 PM.    Chief Complaint  Patient presents with  . Foot Pain      Patient is a 43 y.o. female presenting with lower extremity pain. The history is provided by the patient. No language interpreter was used.  Foot Pain This is a new (right heel) problem. The current episode started 12 to 24 hours ago. The problem occurs constantly. The problem has not changed since onset.Pertinent negatives include no chest pain, no abdominal pain, no headaches and no shortness of breath. Nothing aggravates the symptoms. Nothing relieves the symptoms. She has tried nothing for the symptoms. The treatment provided moderate relief.   HPI Comments: [name] is a 43 y.o. female who presents to the Emergency Department complaining of a sharp pain in her right heel that began 20 hours ago. She states she's been taking tylenol with minimal relief. She states it is difficult for her to walk and it worsens her pain.  Past Medical History  Diagnosis Date  . Diabetes mellitus   . GERD (gastroesophageal reflux disease)   . Cellulitis 07/2011   Past Surgical History  Procedure Laterality Date  . Cholecystectomy    . Knee arthroscopy    . Laparoscopic tubal ligation  04/29/2011    Procedure: LAPAROSCOPIC TUBAL LIGATION;  Surgeon: Turner Danielsavid C Lowe, MD;  Location: [facility];  Service: Gynecology;  Laterality: Bilateral;  with Filshie Clips  . Ablation     Family History  Problem Relation Age of Onset  . Cancer Mother   . Diabetes Mother   . Hypertension Mother    History  Substance Use Topics  . Smoking status: Former Smoker -- 0.50 packs/day for 2 years    Types: Cigarettes    Quit date: 07/22/2011  . Smokeless tobacco: Never Used  . Alcohol Use: Yes     Comment: occasional    OB History   Grav Para Term Preterm Abortions TAB SAB Ect Mult Living                 Review of Systems  Constitutional: Negative for fever.  Respiratory: Negative for shortness of breath.   Cardiovascular: Negative for chest pain.  Gastrointestinal: Negative for abdominal pain.  Musculoskeletal: Negative for joint swelling.       Right heel pain  Skin: Negative for pallor, rash and wound.  Neurological: Negative for headaches.  All other systems reviewed and are negative.     Allergies  Vancomycin and Adhesive  Home Medications   Current Outpatient Rx  Name  Route  Sig  Dispense  Refill  . acetaminophen (TYLENOL) 500 MG tablet   Oral   Take 1,000 mg by mouth every 6 (six) hours as needed. For pain         . ciprofloxacin (CIPRO) 500 MG tablet   Oral   Take 1 tablet (500 mg total) by mouth 2 (two) times daily.   14 tablet   0   . furosemide (LASIX) 20 MG tablet   Oral   Take 1 tablet (20 mg total) by mouth daily.   30 tablet   0   . glucose blood (BL TEST STRIP PACK) test strip      Use as instructed   100 each  12   . HYDROcodone-acetaminophen (NORCO/VICODIN) 5-325 MG per tablet   Oral   Take 2 tablets by mouth every 6 (six) hours as needed.   30 tablet   0   . HYDROcodone-acetaminophen (NORCO/VICODIN) 5-325 MG per tablet   Oral   Take 2 tablets by mouth every 4 (four) hours as needed.   20 tablet   0   . insulin NPH-regular (NOVOLIN 70/30) (70-30) 100 UNIT/ML injection   Subcutaneous   Inject 13 Units into the skin 2 (two) times daily with a meal.   10 mL   12   . Lancets (ACCU-CHEK MULTICLIX) lancets      Use as instructed   100 each   12   . metFORMIN (GLUCOPHAGE) 500 MG tablet   Oral   Take 2 tablets (1,000 mg total) by mouth 2 (two) times daily with a meal.   60 tablet   2   . omeprazole (PRILOSEC OTC) 20 MG tablet   Oral   Take 20 mg by mouth daily.         . ondansetron (ZOFRAN ODT) 8 MG disintegrating tablet       8mg  ODT q4 hours prn nausea   4 tablet   0    Triage Vitals: BP 124/78  Pulse 97  Temp(Src) 98.1 F (36.7 C) (Oral)  Resp 20  SpO2 96%  Physical Exam  Nursing note and vitals reviewed. Constitutional: She is oriented to person, place, and time. She appears well-developed and well-nourished. No distress.  HENT:  Head: Normocephalic and atraumatic.  Right Ear: Hearing and tympanic membrane normal.  Left Ear: Hearing, tympanic membrane and external ear normal.  Nose: Nose normal.  Mouth/Throat: Uvula is midline, oropharynx is clear and moist and mucous membranes are normal. Mucous membranes are not dry. No oropharyngeal exudate.  Eyes: EOM are normal. Pupils are equal, round, and reactive to light.  Neck: Normal range of motion. Neck supple. No tracheal deviation present. No thyromegaly present.  Cardiovascular: Normal rate, regular rhythm, normal heart sounds, intact distal pulses and normal pulses.  Exam reveals no gallop and no friction rub.   No murmur heard. Pulmonary/Chest: Effort normal and breath sounds normal. No respiratory distress. She has no wheezes. She has no rales.  Abdominal: Soft. Bowel sounds are normal. She exhibits no distension. There is no tenderness. There is no rebound and no guarding.  Musculoskeletal: Normal range of motion. She exhibits no edema and no tenderness.  Right foot neurovascularly intact with cap refill < 2 seconds to all digits intact dorsalis pedis and posterior tibial pulses.  No ankle pain FROM.  No wounds no breaks in the skin.  No swelling no warmth or erythema nor fluctuance  Lymphadenopathy:    She has no cervical adenopathy.  Neurological: She is alert and oriented to person, place, and time. She has normal reflexes. She displays normal reflexes.  Skin: Skin is warm and dry. No rash noted. She is not diaphoretic. No erythema. No pallor.  Psychiatric: She has a normal mood and affect.    ED Course  Procedures (including critical care  time)  DIAGNOSTIC STUDIES: Oxygen Saturation is 96% on RA, adequate by my interpretation.    COORDINATION OF CARE: 11:25 PM- Pt advised of plan for treatment and pt agrees.    Labs Review Labs Reviewed - No data to display Imaging Review No results found.   EKG Interpretation None      MDM   Final diagnoses:  None  No wounds no ulcers no point tenderness no deformity.    Heel pain for 24 hours.  Has been on feet for work.  Will treat with pain medication and NSAIDs.  Wear close toed shoes with appropriate arch support and ice and elevate the foot when not on it for work.  Follow up with your PMD this week for recheck  I personally performed the services described in this documentation, which was scribed in my presence. The recorded information has been reviewed and is accurate.     Jasmine Awe, MD 07/29/13 [phone]

## 2013-07-28 NOTE — ED Notes (Signed)
Pt reports awoke this morning with foot pain denies event or injury

## 2013-07-29 ENCOUNTER — Encounter (HOSPITAL_BASED_OUTPATIENT_CLINIC_OR_DEPARTMENT_OTHER): Payer: Self-pay | Admitting: Emergency Medicine

## 2013-07-29 MED ORDER — OXYCODONE-ACETAMINOPHEN 5-325 MG PO TABS: 1.0000 | ORAL_TABLET | Freq: Four times a day (QID) | ORAL | Status: DC | PRN

## 2013-07-29 MED ORDER — IBUPROFEN 800 MG PO TABS: 800.0000 mg | ORAL_TABLET | Freq: Three times a day (TID) | ORAL | Status: DC

## 2013-09-11 ENCOUNTER — Emergency Department (HOSPITAL_BASED_OUTPATIENT_CLINIC_OR_DEPARTMENT_OTHER): Payer: No Typology Code available for payment source

## 2013-09-11 ENCOUNTER — Emergency Department (HOSPITAL_BASED_OUTPATIENT_CLINIC_OR_DEPARTMENT_OTHER)
Admission: EM | Admit: 2013-09-11 | Discharge: 2013-09-12 | Disposition: A | Discharge status: Home / Self Care | Payer: No Typology Code available for payment source | Attending: Emergency Medicine | Admitting: Emergency Medicine

## 2013-09-11 ENCOUNTER — Encounter (HOSPITAL_BASED_OUTPATIENT_CLINIC_OR_DEPARTMENT_OTHER): Payer: Self-pay | Admitting: Emergency Medicine

## 2013-09-11 DIAGNOSIS — K219 Gastro-esophageal reflux disease without esophagitis: Secondary | ICD-10-CM | POA: Insufficient documentation

## 2013-09-11 DIAGNOSIS — Z872 Personal history of diseases of the skin and subcutaneous tissue: Secondary | ICD-10-CM | POA: Insufficient documentation

## 2013-09-11 DIAGNOSIS — Z79899 Other long term (current) drug therapy: Secondary | ICD-10-CM | POA: Insufficient documentation

## 2013-09-11 DIAGNOSIS — E119 Type 2 diabetes mellitus without complications: Secondary | ICD-10-CM | POA: Insufficient documentation

## 2013-09-11 DIAGNOSIS — Z87891 Personal history of nicotine dependence: Secondary | ICD-10-CM | POA: Insufficient documentation

## 2013-09-11 DIAGNOSIS — M62838 Other muscle spasm: Secondary | ICD-10-CM | POA: Insufficient documentation

## 2013-09-11 DIAGNOSIS — Z792 Long term (current) use of antibiotics: Secondary | ICD-10-CM | POA: Insufficient documentation

## 2013-09-11 DIAGNOSIS — Z794 Long term (current) use of insulin: Secondary | ICD-10-CM | POA: Insufficient documentation

## 2013-09-11 MED ORDER — METHOCARBAMOL 500 MG PO TABS
1000.0000 mg | ORAL_TABLET | Freq: Once | ORAL | Status: AC
  Administered 2013-09-11: 1000 mg via ORAL
  Filled 2013-09-11: qty 2

## 2013-09-11 MED ORDER — OXYCODONE-ACETAMINOPHEN 5-325 MG PO TABS
1.0000 | ORAL_TABLET | Freq: Once | ORAL | Status: AC
  Administered 2013-09-11: 1 via ORAL
  Filled 2013-09-11: qty 1

## 2013-09-11 MED ORDER — ONDANSETRON 4 MG PO TBDP
4.0000 mg | ORAL_TABLET | Freq: Once | ORAL | Status: AC
  Administered 2013-09-11: 4 mg via ORAL

## 2013-09-11 MED ORDER — ONDANSETRON 4 MG PO TBDP
ORAL_TABLET | ORAL | Status: DC
Start: 2013-09-11 — End: 2013-09-12
  Filled 2013-09-11: qty 1

## 2013-09-11 NOTE — ED Notes (Signed)
Pt c/o neck pain x 2 days with numbness to left hand

## 2013-09-11 NOTE — ED Provider Notes (Signed)
CSN: [id number]     Arrival date & time 09/11/13  2301 History   First MD Initiated Contact with Patient 09/11/13 2325     This chart was scribed for Melody Cirrincione Smitty CordsK Atul Delucia-Rasch, MD by Arlan OrganAshley Leger, ED Scribe. This patient was seen in room MH10/MH10 and the patient's care was started 11:35 PM.   Chief Complaint  Patient presents with  . Neck Pain   The history is provided by the patient. No language interpreter was used.    HPI Comments: [name] is a [age] y.o. female with a PMHx of DM and GERD who presents to the Emergency Department complaining of constant, moderate left neck pain x 2 days that is unchanged.  No alleviating or aggravating factors. She she has tried OTC Ibuprofen with mild temporary improvement. At this time she denies any fever, chills, loss of sensation, numbness, or tingling. She has no other pertinent past medical history. No other concerns this visit.  Past Medical History  Diagnosis Date  . Diabetes mellitus   . GERD (gastroesophageal reflux disease)   . Cellulitis 07/2011   Past Surgical History  Procedure Laterality Date  . Cholecystectomy    . Knee arthroscopy    . Laparoscopic tubal ligation  04/29/2011    Procedure: LAPAROSCOPIC TUBAL LIGATION;  Surgeon: Turner Danielsavid C Lowe, MD;  Location: [facility];  Service: Gynecology;  Laterality: Bilateral;  with Filshie Clips  . Ablation     Family History  Problem Relation Age of Onset  . Cancer Mother   . Diabetes Mother   . Hypertension Mother    History  Substance Use Topics  . Smoking status: Former Smoker -- 0.50 packs/day for 2 years    Types: Cigarettes    Quit date: 07/22/2011  . Smokeless tobacco: Never Used  . Alcohol Use: Yes     Comment: occasional   OB History   Grav Para Term Preterm Abortions TAB SAB Ect Mult Living                 Review of Systems  Constitutional: Negative for fever and chills.  HENT: Negative for congestion.   Eyes: Negative for redness.  Respiratory: Negative for cough.    Cardiovascular: Negative for chest pain, palpitations and leg swelling.  Gastrointestinal: Negative for abdominal pain.  Musculoskeletal: Positive for neck pain. Negative for gait problem, joint swelling and neck stiffness.  Skin: Negative for rash.  Neurological: Negative for dizziness, facial asymmetry, weakness and numbness.  Psychiatric/Behavioral: Negative for confusion.  All other systems reviewed and are negative.     Allergies  Vancomycin and Adhesive  Home Medications   Prior to Admission medications   Medication Sig Start Date End Date Taking? Authorizing Provider  acetaminophen (TYLENOL) 500 MG tablet Take 1,000 mg by mouth every 6 (six) hours as needed. For pain    Historical Provider, MD  ciprofloxacin (CIPRO) 500 MG tablet Take 1 tablet (500 mg total) by mouth 2 (two) times daily. 04/08/13   Nishant Dhungel, MD  furosemide (LASIX) 20 MG tablet Take 1 tablet (20 mg total) by mouth daily. 05/14/13   Doris Cheadleeepak Advani, MD  glucose blood (BL TEST STRIP PACK) test strip Use as instructed 04/08/13   Nishant Dhungel, MD  HYDROcodone-acetaminophen (NORCO/VICODIN) 5-325 MG per tablet Take 2 tablets by mouth every 6 (six) hours as needed. 04/08/13   Nishant Dhungel, MD  HYDROcodone-acetaminophen (NORCO/VICODIN) 5-325 MG per tablet Take 2 tablets by mouth every 4 (four) hours as needed. 04/16/13  Carlisle Beers Molpus, MD  ibuprofen (ADVIL,MOTRIN) 800 MG tablet Take 1 tablet (800 mg total) by mouth 3 (three) times daily. 07/29/13   Mikah Rottinghaus K Burnham Trost-Rasch, MD  insulin NPH-regular (NOVOLIN 70/30) (70-30) 100 UNIT/ML injection Inject 13 Units into the skin 2 (two) times daily with a meal. 04/08/13   Nishant Dhungel, MD  Lancets (ACCU-CHEK MULTICLIX) lancets Use as instructed 04/08/13   Nishant Dhungel, MD  metFORMIN (GLUCOPHAGE) 500 MG tablet Take 2 tablets (1,000 mg total) by mouth 2 (two) times daily with a meal. 04/08/13   Nishant Dhungel, MD  omeprazole (PRILOSEC OTC) 20 MG tablet Take 20 mg by  mouth daily.    Historical Provider, MD  ondansetron (ZOFRAN ODT) 8 MG disintegrating tablet 8mg  ODT q4 hours prn nausea 04/02/13   Rolan Bucco, MD  oxyCODONE-acetaminophen (PERCOCET) 5-325 MG per tablet Take 1 tablet by mouth every 6 (six) hours as needed. 07/29/13   Tyrice Hewitt K Arlynn Stare-Rasch, MD   Triage Vitals: BP 130/98  Pulse 118  Temp(Src) 97.9 F (36.6 C) (Oral)  Resp 22  Ht 5\' 1"  (1.549 m)  Wt 330 lb (149.687 kg)  BMI 62.39 kg/m2  SpO2 98%   Physical Exam  Nursing note and vitals reviewed. Constitutional: She is oriented to person, place, and time. She appears well-developed and well-nourished.  HENT:  Head: Normocephalic.  Mouth/Throat: Oropharynx is clear and moist.  Eyes: EOM are normal. Pupils are equal, round, and reactive to light.  Neck: Normal range of motion. Neck supple.  Cardiovascular: Normal rate, regular rhythm, normal heart sounds and intact distal pulses.   No murmur heard. Capillary refill less than 2 seconds  Pulmonary/Chest: Effort normal and breath sounds normal. She has no wheezes. She has no rales.  Abdominal: Soft. She exhibits no distension. There is no tenderness. There is no rebound and no guarding.  Hyperactive bowel sounds  Musculoskeletal: Normal range of motion. She exhibits tenderness.  Muscle spasm L trapezius No crepitus No stepoffs No defomity  Lymphadenopathy:    She has no cervical adenopathy.  Neurological: She is alert and oriented to person, place, and time. She has normal reflexes. She displays normal reflexes. No cranial nerve deficit. She exhibits normal muscle tone.  5/5 strength in upper extremities N/V intact on upper extremities  Skin: Skin is warm and dry. No erythema.  Psychiatric: She has a normal mood and affect.    ED Course  Procedures (including critical care time)  DIAGNOSTIC STUDIES: Oxygen Saturation is 98% on RA, Normal by my interpretation.    COORDINATION OF CARE: 11:34 PM- Will give Zofran and Robaxin.  Will order DG cervial spine complete. Discussed treatment plan with pt at bedside and pt agreed to plan.     Labs Review Labs Reviewed - No data to display  Imaging Review No results found.   EKG Interpretation None      MDM   Final diagnoses:  None    Muscle spasm will treat with muscle relaxant and short course of pain medication and heat therapy.  Follow up with sports medicine  I personally performed the services described in this documentation, which was scribed in my presence. The recorded information has been reviewed and is accurate.    Chenell Lozon Smitty Cords, MD 09/12/13 0110

## 2013-09-12 ENCOUNTER — Encounter (HOSPITAL_BASED_OUTPATIENT_CLINIC_OR_DEPARTMENT_OTHER): Payer: Self-pay | Admitting: Emergency Medicine

## 2013-09-12 MED ORDER — HYDROCODONE-ACETAMINOPHEN 5-325 MG PO TABS
1.0000 | ORAL_TABLET | Freq: Four times a day (QID) | ORAL | Status: DC | PRN
Start: 2013-09-12 — End: 2013-10-08

## 2013-09-12 MED ORDER — METHOCARBAMOL 500 MG PO TABS: 500.0000 mg | ORAL_TABLET | Freq: Two times a day (BID) | ORAL | Status: DC

## 2013-09-12 NOTE — Discharge Instructions (Signed)
°  Muscle Cramps and Spasms °Muscle cramps and spasms occur when a muscle or muscles tighten and you have no control over this tightening (involuntary muscle contraction). They are a common problem and can develop in any muscle. The most common place is in the calf muscles of the leg. Both muscle cramps and muscle spasms are involuntary muscle contractions, but they also have differences:  °· Muscle cramps are sporadic and painful. They may last a few seconds to a quarter of an hour. Muscle cramps are often more forceful and last longer than muscle spasms. °· Muscle spasms may or may not be painful. They may also last just a few seconds or much longer. °CAUSES  °It is uncommon for cramps or spasms to be due to a serious underlying problem. In many cases, the cause of cramps or spasms is unknown. Some common causes are:  °· Overexertion.   °· Overuse from repetitive motions (doing the same thing over and over).   °· Remaining in a certain position for a long period of time.   °· Improper preparation, form, or technique while performing a sport or activity.   °· Dehydration.   °· Injury.   °· Side effects of some medicines.   °· Abnormally low levels of the salts and ions in your blood (electrolytes), especially potassium and calcium. This could happen if you are taking water pills (diuretics) or you are pregnant.   °Some underlying medical problems can make it more likely to develop cramps or spasms. These include, but are not limited to:  °· Diabetes.   °· Parkinson disease.   °· Hormone disorders, such as thyroid problems.   °· Alcohol abuse.   °· Diseases specific to muscles, joints, and bones.   °· Blood vessel disease where not enough blood is getting to the muscles.   °HOME CARE INSTRUCTIONS  °· Stay well hydrated. Drink enough water and fluids to keep your urine clear or pale yellow. °· It may be helpful to massage, stretch, and relax the affected muscle. °· For tight or tense muscles, use a warm towel, heating  pad, or hot shower water directed to the affected area. °· If you are sore or have pain after a cramp or spasm, applying ice to the affected area may relieve discomfort. °· Put ice in a plastic bag. °· Place a towel between your skin and the bag. °· Leave the ice on for 15-20 minutes, 03-04 times a day. °· Medicines used to treat a known cause of cramps or spasms may help reduce their frequency or severity. Only take over-the-counter or prescription medicines as directed by your caregiver. °SEEK MEDICAL CARE IF:  °Your cramps or spasms get more severe, more frequent, or do not improve over time.  °MAKE SURE YOU:  °· Understand these instructions. °· Will watch your condition. °· Will get help right away if you are not doing well or get worse. °Document Released: 09/24/2001 Document Revised: 07/30/2012 Document Reviewed: 03/21/2012 °[employer]® Patient Information ©2014 [employer]. ° ° °

## 2013-10-08 ENCOUNTER — Ambulatory Visit (INDEPENDENT_AMBULATORY_CARE_PROVIDER_SITE_OTHER): Payer: No Typology Code available for payment source | Admitting: Family Medicine

## 2013-10-08 ENCOUNTER — Encounter: Payer: Self-pay | Admitting: Family Medicine

## 2013-10-08 VITALS — BP 143/101 | HR 111 | Ht 61.0 in | Wt 330.0 lb

## 2013-10-08 DIAGNOSIS — M5412 Radiculopathy, cervical region: Secondary | ICD-10-CM

## 2013-10-08 DIAGNOSIS — M5010 Cervical disc disorder with radiculopathy, unspecified cervical region: Secondary | ICD-10-CM

## 2013-10-08 MED ORDER — PREDNISONE (PAK) 10 MG PO TABS: ORAL_TABLET | ORAL | Status: DC

## 2013-10-08 MED ORDER — CYCLOBENZAPRINE HCL 10 MG PO TABS: 10.0000 mg | ORAL_TABLET | Freq: Three times a day (TID) | ORAL | Status: DC | PRN

## 2013-10-08 NOTE — Patient Instructions (Addendum)
You have cervical radiculopathy (a pinched nerve in the neck). Prednisone 6 day dose pack to relieve irritation/inflammation of the nerve. Can take ibuprofen or aleve starting the day after you finish the prednisone. Flexeril three times a day as needed for muscle spasms (can make you sleepy - if so do not drive while taking this). Simple range of motion exercises within limits of pain to prevent further stiffness. Consider physical therapy for stretching, exercises, traction, and modalities - let us know if you would like to do this after checking with your insurance company. Heat 15 minutes at a time 3-4 times a day to help with spasms. Watch head position when on computers, texting, when sleeping in bed - should in line with back to prevent further nerve traction and irritation. Consider home traction unit if you get benefit with this in physical therapy. If not improving we will consider an MRI. Call me if not improving over next 2 weeks, otherwise follow up in 5-6 weeks.

## 2013-10-10 ENCOUNTER — Encounter: Payer: Self-pay | Admitting: Family Medicine

## 2013-10-10 DIAGNOSIS — M5010 Cervical disc disorder with radiculopathy, unspecified cervical region: Secondary | ICD-10-CM | POA: Insufficient documentation

## 2013-10-10 NOTE — Assessment & Plan Note (Signed)
Left cervical radiculopathy - start with prednisone dose pack, flexeril as needed.  Start ROM exercises.  Declined formal PT for now - wants to check with insurance company first.  Ergonomic issues discussed.  Heat as needed.  F/u in 5-6 weeks if doing well.  Call if not improving over next 2 weeks and would consider MRI.
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
## 2013-10-17 ENCOUNTER — Ambulatory Visit: Payer: No Typology Code available for payment source | Admitting: Physician Assistant

## 2013-11-11 ENCOUNTER — Encounter (HOSPITAL_BASED_OUTPATIENT_CLINIC_OR_DEPARTMENT_OTHER): Payer: Self-pay | Admitting: Emergency Medicine

## 2013-11-11 ENCOUNTER — Emergency Department (HOSPITAL_BASED_OUTPATIENT_CLINIC_OR_DEPARTMENT_OTHER): Payer: No Typology Code available for payment source

## 2013-11-11 ENCOUNTER — Emergency Department (HOSPITAL_BASED_OUTPATIENT_CLINIC_OR_DEPARTMENT_OTHER)
Admission: EM | Admit: 2013-11-11 | Discharge: 2013-11-12 | Disposition: A | Discharge status: Home / Self Care | Payer: No Typology Code available for payment source | Attending: Emergency Medicine | Admitting: Emergency Medicine

## 2013-11-11 DIAGNOSIS — Z872 Personal history of diseases of the skin and subcutaneous tissue: Secondary | ICD-10-CM | POA: Insufficient documentation

## 2013-11-11 DIAGNOSIS — R1915 Other abnormal bowel sounds: Secondary | ICD-10-CM | POA: Insufficient documentation

## 2013-11-11 DIAGNOSIS — R197 Diarrhea, unspecified: Secondary | ICD-10-CM | POA: Insufficient documentation

## 2013-11-11 DIAGNOSIS — Z87891 Personal history of nicotine dependence: Secondary | ICD-10-CM | POA: Insufficient documentation

## 2013-11-11 DIAGNOSIS — Z79899 Other long term (current) drug therapy: Secondary | ICD-10-CM | POA: Insufficient documentation

## 2013-11-11 DIAGNOSIS — IMO0002 Reserved for concepts with insufficient information to code with codable children: Secondary | ICD-10-CM | POA: Insufficient documentation

## 2013-11-11 DIAGNOSIS — R110 Nausea: Secondary | ICD-10-CM | POA: Insufficient documentation

## 2013-11-11 DIAGNOSIS — Z9851 Tubal ligation status: Secondary | ICD-10-CM | POA: Insufficient documentation

## 2013-11-11 DIAGNOSIS — Z9089 Acquired absence of other organs: Secondary | ICD-10-CM | POA: Insufficient documentation

## 2013-11-11 DIAGNOSIS — Z3202 Encounter for pregnancy test, result negative: Secondary | ICD-10-CM | POA: Insufficient documentation

## 2013-11-11 DIAGNOSIS — Z794 Long term (current) use of insulin: Secondary | ICD-10-CM | POA: Insufficient documentation

## 2013-11-11 DIAGNOSIS — R1031 Right lower quadrant pain: Secondary | ICD-10-CM | POA: Insufficient documentation

## 2013-11-11 DIAGNOSIS — E119 Type 2 diabetes mellitus without complications: Secondary | ICD-10-CM | POA: Insufficient documentation

## 2013-11-11 DIAGNOSIS — K219 Gastro-esophageal reflux disease without esophagitis: Secondary | ICD-10-CM | POA: Insufficient documentation

## 2013-11-11 DIAGNOSIS — R109 Unspecified abdominal pain: Secondary | ICD-10-CM | POA: Insufficient documentation

## 2013-11-11 LAB — CBC WITH DIFFERENTIAL/PLATELET
Basophils Absolute: 0 10*3/uL (ref 0.0–0.1)
Basophils Relative: 0 % (ref 0–1)
Eosinophils Absolute: 0.4 10*3/uL (ref 0.0–0.7)
Eosinophils Relative: 3 % (ref 0–5)
HCT: 40 % (ref 36.0–46.0)
Hemoglobin: 13.2 g/dL (ref 12.0–15.0)
Lymphocytes Relative: 22 % (ref 12–46)
Lymphs Abs: 2.9 10*3/uL (ref 0.7–4.0)
MCH: 27.3 pg (ref 26.0–34.0)
MCHC: 33 g/dL (ref 30.0–36.0)
MCV: 82.6 fL (ref 78.0–100.0)
Monocytes Absolute: 0.8 10*3/uL (ref 0.1–1.0)
Monocytes Relative: 6 % (ref 3–12)
Neutro Abs: 9 10*3/uL — ABNORMAL HIGH (ref 1.7–7.7)
Neutrophils Relative %: 69 % (ref 43–77)
Platelets: 342 10*3/uL (ref 150–400)
RBC: 4.84 MIL/uL (ref 3.87–5.11)
RDW: 14.4 % (ref 11.5–15.5)
WBC: 13.1 10*3/uL — ABNORMAL HIGH (ref 4.0–10.5)

## 2013-11-11 LAB — URINE MICROSCOPIC-ADD ON

## 2013-11-11 LAB — URINALYSIS, ROUTINE W REFLEX MICROSCOPIC
Bilirubin Urine: NEGATIVE
Glucose, UA: >1000 mg/dL — AB
Hgb urine dipstick: NEGATIVE
Ketones, ur: NEGATIVE mg/dL
Nitrite: NEGATIVE
Protein, ur: NEGATIVE mg/dL
Specific Gravity, Urine: 1.038 — ABNORMAL HIGH (ref 1.005–1.030)
Urobilinogen, UA: 0.2 mg/dL (ref 0.0–1.0)
pH: 5 (ref 5.0–8.0)

## 2013-11-11 LAB — PREGNANCY, URINE: Preg Test, Ur: NEGATIVE

## 2013-11-11 MED ORDER — MORPHINE SULFATE 4 MG/ML IJ SOLN
4.0000 mg | Freq: Once | INTRAMUSCULAR | Status: AC
  Administered 2013-11-12: 4 mg via INTRAVENOUS
  Filled 2013-11-11: qty 1

## 2013-11-11 NOTE — ED Provider Notes (Signed)
CSN: [id number]     Arrival date & time 11/11/13  2019 History  This chart was scribed for Francisco Eyerly Smitty CordsK Ramonita Koenig-Rasch, MD by Milly JakobJohn Lee Graves, ED Scribe. The patient was seen in room MH08/MH08. Patient's care was started at 11:13 PM.   Chief Complaint  Patient presents with  . Abdominal Pain    Patient is a 43 y.o. female presenting with abdominal pain. The history is provided by the patient. No language interpreter was used.  Abdominal Pain Pain location:  RLQ Pain quality: sharp   Pain radiates to:  Does not radiate Pain severity:  Severe Duration:  1 week Timing:  Constant Progression:  Unchanged Chronicity:  New Context: not suspicious food intake and not trauma   Relieved by:  Nothing Worsened by:  Nothing tried Ineffective treatments:  None tried Associated symptoms: diarrhea (6 episodes) and nausea   Associated symptoms: no dysuria, no fever, no hematuria and no vomiting   Nausea:    Severity:  Moderate   Onset quality:  Gradual Risk factors: no recent hospitalization    She reports a history of hysterectomy.  Past Medical History  Diagnosis Date  . Diabetes mellitus   . GERD (gastroesophageal reflux disease)   . Cellulitis 07/2011   Past Surgical History  Procedure Laterality Date  . Cholecystectomy    . Knee arthroscopy    . Laparoscopic tubal ligation  04/29/2011    Procedure: LAPAROSCOPIC TUBAL LIGATION;  Surgeon: Turner Danielsavid C Lowe, MD;  Location: [facility];  Service: Gynecology;  Laterality: Bilateral;  with Filshie Clips  . Ablation     Family History  Problem Relation Age of Onset  . Cancer Mother   . Diabetes Mother   . Hypertension Mother    History  Substance Use Topics  . Smoking status: Former Smoker -- 0.50 packs/day for 2 years    Types: Cigarettes    Quit date: 07/22/2011  . Smokeless tobacco: Never Used  . Alcohol Use: Yes     Comment: occasional   OB History   Grav Para Term Preterm Abortions TAB SAB Ect Mult Living                 Review of  Systems  Constitutional: Negative for fever.  Gastrointestinal: Positive for nausea, abdominal pain and diarrhea (6 episodes). Negative for vomiting.  Genitourinary: Negative for dysuria and hematuria.  All other systems reviewed and are negative.     Allergies  Vancomycin and Adhesive  Home Medications   Prior to Admission medications   Medication Sig Start Date End Date Taking? Authorizing Provider  cyclobenzaprine (FLEXERIL) 10 MG tablet Take 1 tablet (10 mg total) by mouth every 8 (eight) hours as needed for muscle spasms. 10/08/13   Lenda KelpShane R Hudnall, MD  furosemide (LASIX) 20 MG tablet Take 1 tablet (20 mg total) by mouth daily. 05/14/13   Doris Cheadleeepak Advani, MD  glucose blood (BL TEST STRIP PACK) test strip Use as instructed 04/08/13   Nishant Dhungel, MD  insulin NPH-regular (NOVOLIN 70/30) (70-30) 100 UNIT/ML injection Inject 13 Units into the skin 2 (two) times daily with a meal. 04/08/13   Nishant Dhungel, MD  Lancets (ACCU-CHEK MULTICLIX) lancets Use as instructed 04/08/13   Nishant Dhungel, MD  metFORMIN (GLUCOPHAGE) 500 MG tablet Take 2 tablets (1,000 mg total) by mouth 2 (two) times daily with a meal. 04/08/13   Nishant Dhungel, MD  omeprazole (PRILOSEC OTC) 20 MG tablet Take 20 mg by mouth daily.    Historical Provider, MD  predniSONE (STERAPRED UNI-PAK) 10 MG tablet 6 tabs po day 1, 5 tabs po day 2, 4 tabs po day 3, 3 tabs po day 4, 2 tabs po day 5, 1 tab po day 6 10/08/13   Lenda Kelp, MD   Triage Vitals: Pulse 124  Temp(Src) 97.9 F (36.6 C) (Oral)  Resp 22  Ht 5\' 1"  (1.549 m)  Wt 330 lb (149.687 kg)  BMI 62.39 kg/m2  SpO2 98% Physical Exam  Nursing note and vitals reviewed. Constitutional: She is oriented to person, place, and time. She appears well-developed and well-nourished. No distress.  HENT:  Head: Normocephalic and atraumatic.  Mouth/Throat: Oropharynx is clear and moist.  Eyes: Conjunctivae and EOM are normal.  Neck: Normal range of motion. Neck supple.  No tracheal deviation present.  Cardiovascular: Normal rate, regular rhythm and normal heart sounds.   No murmur heard. Pulmonary/Chest: Effort normal and breath sounds normal. No respiratory distress. She has no wheezes. She has no rales.  Abdominal: Soft. There is no tenderness. There is no rebound and no guarding.  Hyperactive bowel sounds  Musculoskeletal: Normal range of motion. She exhibits no edema.  Neurological: She is alert and oriented to person, place, and time. She has normal reflexes.  Skin: Skin is warm and dry. She is not diaphoretic.  Psychiatric: She has a normal mood and affect. Her behavior is normal.    ED Course  Procedures (including critical care time) DIAGNOSTIC STUDIES: Oxygen Saturation is 98% on room air, normal by my interpretation.    COORDINATION OF CARE: 11:20 PM-Discussed treatment plan with pt at bedside and pt agreed to plan.   Labs Review Labs Reviewed  URINALYSIS, ROUTINE W REFLEX MICROSCOPIC - Abnormal; Notable for the following:    APPearance CLOUDY (*)    Specific Gravity, Urine 1.038 (*)    Glucose, UA >1000 (*)    Leukocytes, UA SMALL (*)    All other components within normal limits  URINE MICROSCOPIC-ADD ON - Abnormal; Notable for the following:    Squamous Epithelial / LPF FEW (*)    All other components within normal limits  PREGNANCY, URINE    Imaging Review No results found.   EKG Interpretation None      MDM   Final diagnoses:  None    Muscle strain.  See your doctor about your elevated sugars abstain from high carb foods.    I personally performed the services described in this documentation, which was scribed in my presence. The recorded information has been reviewed and is accurate.     Jasmine Awe, MD 11/12/13 [phone]

## 2013-11-11 NOTE — ED Notes (Signed)
Pt c/o abd pain, n/v/d x 3 days 

## 2013-11-12 ENCOUNTER — Encounter (HOSPITAL_BASED_OUTPATIENT_CLINIC_OR_DEPARTMENT_OTHER): Payer: Self-pay | Admitting: Emergency Medicine

## 2013-11-12 LAB — BASIC METABOLIC PANEL
Anion gap: 14 (ref 5–15)
BUN: 10 mg/dL (ref 6–23)
CO2: 25 mEq/L (ref 19–32)
Calcium: 9.3 mg/dL (ref 8.4–10.5)
Chloride: 96 mEq/L (ref 96–112)
Creatinine, Ser: 0.6 mg/dL (ref 0.50–1.10)
GFR calc Af Amer: >90 mL/min (ref >90)
GFR calc non Af Amer: >90 mL/min (ref >90)
Glucose, Bld: 406 mg/dL — ABNORMAL HIGH (ref 70–99)
Potassium: 4.1 mEq/L (ref 3.7–5.3)
Sodium: 135 mEq/L — ABNORMAL LOW (ref 137–147)

## 2013-11-12 LAB — CBG MONITORING, ED: Glucose-Capillary: 266 mg/dL — ABNORMAL HIGH (ref 70–99)

## 2013-11-12 MED ORDER — OXYCODONE-ACETAMINOPHEN 5-325 MG PO TABS
1.0000 | ORAL_TABLET | Freq: Once | ORAL | Status: AC
  Administered 2013-11-12: 1 via ORAL
  Filled 2013-11-12: qty 1

## 2013-11-12 MED ORDER — SODIUM CHLORIDE 0.9 % IV SOLN
Freq: Once | INTRAVENOUS | Status: AC
  Administered 2013-11-12: via INTRAVENOUS

## 2013-11-12 MED ORDER — HYDROCODONE-ACETAMINOPHEN 5-325 MG PO TABS: 1.0000 | ORAL_TABLET | Freq: Four times a day (QID) | ORAL | Status: DC | PRN

## 2013-11-12 MED ORDER — IBUPROFEN 800 MG PO TABS
800.0000 mg | ORAL_TABLET | Freq: Once | ORAL | Status: AC
  Administered 2013-11-12: 800 mg via ORAL
  Filled 2013-11-12: qty 1

## 2013-11-12 MED ORDER — INSULIN REGULAR HUMAN 100 UNIT/ML IJ SOLN
4.0000 [IU] | Freq: Once | INTRAMUSCULAR | Status: AC
  Administered 2013-11-12: 4 [IU] via INTRAVENOUS
  Filled 2013-11-12: qty 1

## 2013-11-12 MED ORDER — ONDANSETRON HCL 4 MG/2ML IJ SOLN
INTRAMUSCULAR | Status: AC
  Filled 2013-11-12: qty 2

## 2013-11-12 MED ORDER — ONDANSETRON HCL 4 MG/2ML IJ SOLN
4.0000 mg | Freq: Once | INTRAMUSCULAR | Status: AC
  Administered 2013-11-12: 4 mg via INTRAVENOUS

## 2013-11-12 MED ORDER — PROMETHAZINE HCL 12.5 MG RE SUPP: 12.5000 mg | Freq: Four times a day (QID) | RECTAL | Status: DC | PRN

## 2013-11-28 ENCOUNTER — Emergency Department (HOSPITAL_BASED_OUTPATIENT_CLINIC_OR_DEPARTMENT_OTHER)
Admission: EM | Admit: 2013-11-28 | Discharge: 2013-11-28 | Disposition: A | Discharge status: Home / Self Care | Payer: No Typology Code available for payment source | Attending: Emergency Medicine | Admitting: Emergency Medicine

## 2013-11-28 ENCOUNTER — Encounter (HOSPITAL_BASED_OUTPATIENT_CLINIC_OR_DEPARTMENT_OTHER): Payer: Self-pay | Admitting: Emergency Medicine

## 2013-11-28 ENCOUNTER — Emergency Department (HOSPITAL_BASED_OUTPATIENT_CLINIC_OR_DEPARTMENT_OTHER): Payer: No Typology Code available for payment source

## 2013-11-28 DIAGNOSIS — Y929 Unspecified place or not applicable: Secondary | ICD-10-CM | POA: Insufficient documentation

## 2013-11-28 DIAGNOSIS — L03115 Cellulitis of right lower limb: Secondary | ICD-10-CM

## 2013-11-28 DIAGNOSIS — Y9389 Activity, other specified: Secondary | ICD-10-CM | POA: Insufficient documentation

## 2013-11-28 DIAGNOSIS — S46909A Unspecified injury of unspecified muscle, fascia and tendon at shoulder and upper arm level, unspecified arm, initial encounter: Secondary | ICD-10-CM | POA: Insufficient documentation

## 2013-11-28 DIAGNOSIS — Z79899 Other long term (current) drug therapy: Secondary | ICD-10-CM | POA: Insufficient documentation

## 2013-11-28 DIAGNOSIS — X500XXA Overexertion from strenuous movement or load, initial encounter: Secondary | ICD-10-CM | POA: Insufficient documentation

## 2013-11-28 DIAGNOSIS — L02419 Cutaneous abscess of limb, unspecified: Secondary | ICD-10-CM | POA: Insufficient documentation

## 2013-11-28 DIAGNOSIS — L03119 Cellulitis of unspecified part of limb: Secondary | ICD-10-CM | POA: Insufficient documentation

## 2013-11-28 DIAGNOSIS — Z794 Long term (current) use of insulin: Secondary | ICD-10-CM | POA: Insufficient documentation

## 2013-11-28 DIAGNOSIS — E119 Type 2 diabetes mellitus without complications: Secondary | ICD-10-CM | POA: Insufficient documentation

## 2013-11-28 DIAGNOSIS — S4980XA Other specified injuries of shoulder and upper arm, unspecified arm, initial encounter: Secondary | ICD-10-CM | POA: Insufficient documentation

## 2013-11-28 DIAGNOSIS — Z87891 Personal history of nicotine dependence: Secondary | ICD-10-CM | POA: Insufficient documentation

## 2013-11-28 DIAGNOSIS — K219 Gastro-esophageal reflux disease without esophagitis: Secondary | ICD-10-CM | POA: Insufficient documentation

## 2013-11-28 DIAGNOSIS — IMO0002 Reserved for concepts with insufficient information to code with codable children: Secondary | ICD-10-CM | POA: Insufficient documentation

## 2013-11-28 DIAGNOSIS — S46912A Strain of unspecified muscle, fascia and tendon at shoulder and upper arm level, left arm, initial encounter: Secondary | ICD-10-CM

## 2013-11-28 MED ORDER — CEPHALEXIN 500 MG PO CAPS: 500.0000 mg | ORAL_CAPSULE | Freq: Four times a day (QID) | ORAL | Status: DC

## 2013-11-28 MED ORDER — HYDROCODONE-ACETAMINOPHEN 5-325 MG PO TABS: 1.0000 | ORAL_TABLET | Freq: Four times a day (QID) | ORAL | Status: DC | PRN

## 2013-11-28 NOTE — Discharge Instructions (Signed)
Ibuprofen 600 mg 3 times daily for the next 5 days.  Keflex as prescribed. Hydrocodone as prescribed as needed for pain not relieved with ibuprofen.  Wear shoulder sling as applied for comfort.  Followup with your primary Dr. if not improving in 1 week, and return to the ER if you develop new symptoms including high fever, vomiting, or increased swelling of your leg.    Cellulitis Cellulitis is an infection of the skin and the tissue beneath it. The infected area is usually red and tender. Cellulitis occurs most often in the arms and lower legs.  CAUSES  Cellulitis is caused by bacteria that enter the skin through cracks or cuts in the skin. The most common types of bacteria that cause cellulitis are staphylococci and streptococci. SIGNS AND SYMPTOMS   Redness and warmth.  Swelling.  Tenderness or pain.  Fever. DIAGNOSIS  Your health care provider can usually determine what is wrong based on a physical exam. Blood tests may also be done. TREATMENT  Treatment usually involves taking an antibiotic medicine. HOME CARE INSTRUCTIONS   Take your antibiotic medicine as directed by your health care provider. Finish the antibiotic even if you start to feel better.  Keep the infected arm or leg elevated to reduce swelling.  Apply a warm cloth to the affected area up to 4 times per day to relieve pain.  Take medicines only as directed by your health care provider.  Keep all follow-up visits as directed by your health care provider. SEEK MEDICAL CARE IF:   You notice red streaks coming from the infected area.  Your red area gets larger or turns dark in color.  Your bone or joint underneath the infected area becomes painful after the skin has healed.  Your infection returns in the same area or another area.  You notice a swollen bump in the infected area.  You develop new symptoms.  You have a fever. SEEK IMMEDIATE MEDICAL CARE IF:   You feel very sleepy.  You develop  vomiting or diarrhea.  You have a general ill feeling (malaise) with muscle aches and pains. MAKE SURE YOU:   Understand these instructions.  Will watch your condition.  Will get help right away if you are not doing well or get worse. Document Released: 01/12/2005 Document Revised: 08/19/2013 Document Reviewed: 06/20/2011 [facility] Patient Information 2015 [place]. This information is not intended to replace advice given to you by your health care provider. Make sure you discuss any questions you have with your health care provider.  Shoulder Sprain A shoulder sprain is the result of damage to the tough, fiber-like tissues (ligaments) that help hold your shoulder in place. The ligaments may be stretched or torn. Besides the main shoulder joint (the ball and socket), there are several smaller joints that connect the bones in this area. A sprain usually involves one of those joints. Most often it is the acromioclavicular (or AC) joint. That is the joint that connects the collarbone (clavicle) and the shoulder blade (scapula) at the top point of the shoulder blade (acromion). A shoulder sprain is a mild form of what is called a shoulder separation. Recovering from a shoulder sprain may take some time. For some, pain lingers for several months. Most people recover without long term problems. CAUSES   A shoulder sprain is usually caused by some kind of trauma. This might be:  Falling on an outstretched arm.  Being hit hard on the shoulder.  Twisting the arm.  Shoulder sprains  are more likely to occur in people who:  Play sports.  Have balance or coordination problems. SYMPTOMS   Pain when you move your shoulder.  Limited ability to move the shoulder.  Swelling and tenderness on top of the shoulder.  Redness or warmth in the shoulder.  Bruising.  A change in the shape of the shoulder. DIAGNOSIS  Your healthcare provider may:  Ask about your symptoms.  Ask about  recent activity that might have caused those symptoms.  Examine your shoulder. You may be asked to do simple exercises to test movement. The other shoulder will be examined for comparison.  Order some tests that provide a look inside the body. They can show the extent of the injury. The tests could include:  X-rays.  CT (computed tomography) scan.  MRI (magnetic resonance imaging) scan. RISKS AND COMPLICATIONS  Loss of full shoulder motion.  Ongoing shoulder pain. TREATMENT  How long it takes to recover from a shoulder sprain depends on how severe it was. Treatment options may include:  Rest. You should not use the arm or shoulder until it heals.  Ice. For 2 or 3 days after the injury, put an ice pack on the shoulder up to 4 times a day. It should stay on for 15 to 20 minutes each time. Wrap the ice in a towel so it does not touch your skin.  Over-the-counter medicine to relieve pain.  A sling or brace. This will keep the arm still while the shoulder is healing.  Physical therapy or rehabilitation exercises. These will help you regain strength and motion. Ask your healthcare provider when it is OK to begin these exercises.  Surgery. The need for surgery is rare with a sprained shoulder, but some people may need surgery to keep the joint in place and reduce pain. HOME CARE INSTRUCTIONS   Ask your healthcare provider about what you should and should not do while your shoulder heals.  Make sure you know how to apply ice to the correct area of your shoulder.  Talk with your healthcare provider about which medications should be used for pain and swelling.  If rehabilitation therapy will be needed, ask your healthcare provider to refer you to a therapist. If it is not recommended, then ask about at-home exercises. Find out when exercise should begin. SEEK MEDICAL CARE IF:  Your pain, swelling, or redness at the joint increases. SEEK IMMEDIATE MEDICAL CARE IF:   You have a  fever.  You cannot move your arm or shoulder. Document Released: 08/21/2008 Document Revised: 06/27/2011 Document Reviewed: 08/21/2008 [facility] Patient Information 2015 [place], [place]. This information is not intended to replace advice given to you by your health care provider. Make sure you discuss any questions you have with your health care provider.

## 2013-11-28 NOTE — ED Provider Notes (Signed)
CSN: [id number]     Arrival date & time 11/28/13  2132 History  This chart was scribed for Geoffery Lyonsouglas Gizella Belleville, MD by Luisa DagoPriscilla Tutu, ED Scribe. This patient was seen in room MH08/MH08 and the patient's care was started at 11:25 PM.    Chief Complaint  Patient presents with  . Shoulder Pain   The history is provided by the patient. No language interpreter was used.   HPI Comments: [name] is a 43 y.o. female who presents to the Emergency Department complaining of a left shoulder injury that occurred this afternoon.  Pt states that she was trying to catch a falling object when she felt a pop in her left shoulder. She states that she had a history of a pinched nerve to the left shoulder. Pt is also complaining of associated pain which she describes has a "numbing" sensation to it. She states that the pain is worsened by movement. Denies any fever, chills, nausea, emesis, chest pain, or SOB.  Pt is also complaining of 3 blisters to her right leg. She has a history of cellulitis.   Past Medical History  Diagnosis Date  . Diabetes mellitus   . GERD (gastroesophageal reflux disease)   . Cellulitis 07/2011   Past Surgical History  Procedure Laterality Date  . Cholecystectomy    . Knee arthroscopy    . Laparoscopic tubal ligation  04/29/2011    Procedure: LAPAROSCOPIC TUBAL LIGATION;  Surgeon: Turner Danielsavid C Lowe, MD;  Location: [facility];  Service: Gynecology;  Laterality: Bilateral;  with Filshie Clips  . Ablation     Family History  Problem Relation Age of Onset  . Cancer Mother   . Diabetes Mother   . Hypertension Mother    History  Substance Use Topics  . Smoking status: Former Smoker -- 0.50 packs/day for 2 years    Types: Cigarettes    Quit date: 07/22/2011  . Smokeless tobacco: Never Used  . Alcohol Use: Yes     Comment: occasional   OB History   Grav Para Term Preterm Abortions TAB SAB Ect Mult Living                 Review of Systems A complete 10 system review of systems  was obtained and all systems are negative except as noted in the HPI and PMH.     Allergies  Vancomycin and Adhesive  Home Medications   Prior to Admission medications   Medication Sig Start Date End Date Taking? Authorizing Provider  cyclobenzaprine (FLEXERIL) 10 MG tablet Take 1 tablet (10 mg total) by mouth every 8 (eight) hours as needed for muscle spasms. 10/08/13   Lenda KelpShane R Hudnall, MD  furosemide (LASIX) 20 MG tablet Take 1 tablet (20 mg total) by mouth daily. 05/14/13   Doris Cheadleeepak Advani, MD  glucose blood (BL TEST STRIP PACK) test strip Use as instructed 04/08/13   Nishant Dhungel, MD  HYDROcodone-acetaminophen (NORCO) 5-325 MG per tablet Take 1 tablet by mouth every 6 (six) hours as needed. 11/12/13   April K Palumbo-Rasch, MD  insulin NPH-regular (NOVOLIN 70/30) (70-30) 100 UNIT/ML injection Inject 13 Units into the skin 2 (two) times daily with a meal. 04/08/13   Nishant Dhungel, MD  Lancets (ACCU-CHEK MULTICLIX) lancets Use as instructed 04/08/13   Nishant Dhungel, MD  metFORMIN (GLUCOPHAGE) 500 MG tablet Take 2 tablets (1,000 mg total) by mouth 2 (two) times daily with a meal. 04/08/13   Nishant Dhungel, MD  omeprazole (PRILOSEC OTC) 20 MG tablet  Take 20 mg by mouth daily.    Historical Provider, MD  predniSONE (STERAPRED UNI-PAK) 10 MG tablet 6 tabs po day 1, 5 tabs po day 2, 4 tabs po day 3, 3 tabs po day 4, 2 tabs po day 5, 1 tab po day 6 10/08/13   Lenda Kelp, MD  promethazine (PHENERGAN) 12.5 MG suppository Place 1 suppository (12.5 mg total) rectally every 6 (six) hours as needed for nausea or vomiting. 11/12/13   April K Palumbo-Rasch, MD   BP 130/53  Pulse 102  Temp(Src) 98 F (36.7 C) (Oral)  Resp 20  Ht 5\' 1"  (1.549 m)  Wt 330 lb (149.687 kg)  BMI 62.39 kg/m2  SpO2 98%  Physical Exam  Nursing note and vitals reviewed. Constitutional: She appears well-developed and well-nourished. No distress.  HENT:  Head: Normocephalic and atraumatic.  Eyes: Conjunctivae are  normal. Right eye exhibits no discharge. Left eye exhibits no discharge.  Neck: Neck supple.  Cardiovascular: Normal rate, regular rhythm and normal heart sounds.  Exam reveals no gallop and no friction rub.   No murmur heard. Pulmonary/Chest: Effort normal and breath sounds normal. No respiratory distress.  Abdominal: Soft. She exhibits no distension. There is no tenderness.  Musculoskeletal: She exhibits no edema and no tenderness.  The left shoulder appears grossly normal. There is tenderness to palpation over the lateral and posterior deltoid. There is pain with any ROM. Distal pulses, motor, and sensory are intact.   Right leg is noted to have mild erythema over the anterior aspect of the lower tibia. There is some warmth.  Neurological: She is alert.  Skin: Skin is warm and dry.  Psychiatric: She has a normal mood and affect. Her behavior is normal. Thought content normal.    ED Course  Procedures (including critical care time)  DIAGNOSTIC STUDIES: Oxygen Saturation is 98% on RA, normal by my interpretation.    COORDINATION OF CARE: 11:28 PM- informed pt of the results of her left shoulder X-ray. Will order a sling and prescribe antiinflammatories. Advised pt to rest the shoulder. If symptoms do no improve in a week she is advised to follow up with her PCP. Pt advised of plan for treatment and pt agrees.  Imaging Review Dg Shoulder Left  11/28/2013   CLINICAL DATA:  Felt pop in left shoulder while attempting to catch a falling object.  EXAM: LEFT SHOULDER - 2+ VIEW  COMPARISON:  None.  FINDINGS: There is no evidence of fracture or dislocation. The left humeral head is seated within the glenoid fossa. Mild degenerative change is noted at the left acromioclavicular joint. No significant soft tissue abnormalities are seen. The visualized portions of the left lung are clear.  IMPRESSION: No evidence of fracture or dislocation.   Electronically Signed   By: Roanna Raider M.D.   On:  11/28/2013 23:07    MDM   Final diagnoses:  None    Patient presents with shoulder pain. The x-rays are negative. I suspect this is a soft tissue injury she will be treated with immobilization and anti-inflammatories. She is also complaining of redness to her right lower leg. She has a history of cellulitis and this appears to be recurring. I will add Keflex and she is to followup as needed if not improving.  I personally performed the services described in this documentation, which was scribed in my presence. The recorded information has been reviewed and is accurate.    Geoffery Lyons, MD 11/29/13 [phone]

## 2013-11-28 NOTE — ED Notes (Signed)
Pt was attempting to catch a falling object and felt a pop in her left shoulder this afternoon. Pt also c/o blisters on her leg.

## 2013-12-15 ENCOUNTER — Emergency Department (HOSPITAL_BASED_OUTPATIENT_CLINIC_OR_DEPARTMENT_OTHER)
Admission: EM | Admit: 2013-12-15 | Discharge: 2013-12-15 | Disposition: A | Discharge status: Home / Self Care | Payer: No Typology Code available for payment source | Attending: Emergency Medicine | Admitting: Emergency Medicine

## 2013-12-15 ENCOUNTER — Encounter (HOSPITAL_BASED_OUTPATIENT_CLINIC_OR_DEPARTMENT_OTHER): Payer: Self-pay | Admitting: Emergency Medicine

## 2013-12-15 ENCOUNTER — Emergency Department (HOSPITAL_BASED_OUTPATIENT_CLINIC_OR_DEPARTMENT_OTHER): Payer: No Typology Code available for payment source

## 2013-12-15 DIAGNOSIS — E119 Type 2 diabetes mellitus without complications: Secondary | ICD-10-CM | POA: Insufficient documentation

## 2013-12-15 DIAGNOSIS — R05 Cough: Secondary | ICD-10-CM | POA: Insufficient documentation

## 2013-12-15 DIAGNOSIS — Z79899 Other long term (current) drug therapy: Secondary | ICD-10-CM | POA: Insufficient documentation

## 2013-12-15 DIAGNOSIS — J020 Streptococcal pharyngitis: Secondary | ICD-10-CM | POA: Insufficient documentation

## 2013-12-15 DIAGNOSIS — K219 Gastro-esophageal reflux disease without esophagitis: Secondary | ICD-10-CM | POA: Insufficient documentation

## 2013-12-15 DIAGNOSIS — L03115 Cellulitis of right lower limb: Secondary | ICD-10-CM

## 2013-12-15 DIAGNOSIS — R112 Nausea with vomiting, unspecified: Secondary | ICD-10-CM | POA: Insufficient documentation

## 2013-12-15 DIAGNOSIS — L03119 Cellulitis of unspecified part of limb: Secondary | ICD-10-CM | POA: Insufficient documentation

## 2013-12-15 DIAGNOSIS — Z794 Long term (current) use of insulin: Secondary | ICD-10-CM | POA: Insufficient documentation

## 2013-12-15 DIAGNOSIS — R51 Headache: Secondary | ICD-10-CM | POA: Insufficient documentation

## 2013-12-15 DIAGNOSIS — Z87891 Personal history of nicotine dependence: Secondary | ICD-10-CM | POA: Insufficient documentation

## 2013-12-15 DIAGNOSIS — L02419 Cutaneous abscess of limb, unspecified: Secondary | ICD-10-CM | POA: Insufficient documentation

## 2013-12-15 DIAGNOSIS — R059 Cough, unspecified: Secondary | ICD-10-CM | POA: Insufficient documentation

## 2013-12-15 DIAGNOSIS — Z792 Long term (current) use of antibiotics: Secondary | ICD-10-CM | POA: Insufficient documentation

## 2013-12-15 LAB — RAPID STREP SCREEN (MED CTR MEBANE ONLY): Streptococcus, Group A Screen (Direct): POSITIVE — AB

## 2013-12-15 MED ORDER — SULFAMETHOXAZOLE-TRIMETHOPRIM 800-160 MG PO TABS: 1.0000 | ORAL_TABLET | Freq: Two times a day (BID) | ORAL | Status: DC

## 2013-12-15 MED ORDER — PENICILLIN G BENZATHINE 1200000 UNIT/2ML IM SUSP
INTRAMUSCULAR | Status: AC
  Filled 2013-12-15: qty 2

## 2013-12-15 MED ORDER — PENICILLIN G BENZATHINE 1200000 UNIT/2ML IM SUSP
2.4000 10*6.[IU] | Freq: Once | INTRAMUSCULAR | Status: AC
Start: 2013-12-15 — End: 2013-12-15
  Administered 2013-12-15: 2.4 10*6.[IU] via INTRAMUSCULAR
  Filled 2013-12-15: qty 4

## 2013-12-15 NOTE — ED Provider Notes (Signed)
CSN: [id number]     Arrival date & time 12/15/13  1349 History   This chart was scribed for Elwin Mocha, MD, by Yevette Edwards, ED Scribe. This patient was seen in room MH08/MH08 and the patient's care was started at 3:48 PM.  First MD Initiated Contact with Patient 12/15/13 1532     Chief Complaint  Patient presents with  . Cough    Patient is a 43 y.o. female presenting with cough. The history is provided by the patient and the spouse. No language interpreter was used.  Cough Cough characteristics:  Non-productive Severity:  Moderate Onset quality:  Gradual Duration:  6 days Timing:  Intermittent Progression:  Worsening Chronicity:  New Smoker: Former.   Relieved by:  Nothing Worsened by:  Nothing tried Ineffective treatments:  None tried Associated symptoms: chills, fever, headaches, rash and sore throat    HPI Comments: [name] is a 43 y.o. female, with a h/o DM and cellulitis, who presents to the Emergency Department complaining of a nonproductive cough which began six days ago and which has been associated with a sore throat, headache, nausea, emesis, diarrhea, chills, and a fever. She reports she measured her temperature at home at 101.4 F; in the ED her temperature is 97.6 F.  She voices one episode of emesis.   [name] also endorses a h/o cellulitis; she states she has noticed blisters and gradually-progressive swelling to her lower extremities in the past few days. She reports a h/o similar symptoms when she has a fever. She was treated in the ED two weeks ago for cellulitis; she was prescribed a course of Keflex which she has finished.   She is a former smoker.    Past Medical History  Diagnosis Date  . Diabetes mellitus   . GERD (gastroesophageal reflux disease)   . Cellulitis 07/2011   Past Surgical History  Procedure Laterality Date  . Cholecystectomy    . Knee arthroscopy    . Laparoscopic tubal ligation  04/29/2011    Procedure: LAPAROSCOPIC  TUBAL LIGATION;  Surgeon: Turner Daniels, MD;  Location: [facility];  Service: Gynecology;  Laterality: Bilateral;  with Filshie Clips  . Ablation     Family History  Problem Relation Age of Onset  . Cancer Mother   . Diabetes Mother   . Hypertension Mother    History  Substance Use Topics  . Smoking status: Former Smoker -- 0.50 packs/day for 2 years    Types: Cigarettes    Quit date: 07/22/2011  . Smokeless tobacco: Never Used  . Alcohol Use: Yes     Comment: occasional   No OB history provided.   Review of Systems  Constitutional: Positive for fever and chills.  HENT: Positive for sore throat.   Respiratory: Positive for cough.   Cardiovascular: Positive for leg swelling.  Gastrointestinal: Positive for nausea, vomiting and diarrhea.  Skin: Positive for color change and rash.  Neurological: Positive for headaches.  All other systems reviewed and are negative.   Allergies  Vancomycin and Adhesive  Home Medications   Prior to Admission medications   Medication Sig Start Date End Date Taking? Authorizing Provider  cephALEXin (KEFLEX) 500 MG capsule Take 1 capsule (500 mg total) by mouth 4 (four) times daily. 11/28/13   Geoffery Lyons, MD  cyclobenzaprine (FLEXERIL) 10 MG tablet Take 1 tablet (10 mg total) by mouth every 8 (eight) hours as needed for muscle spasms. 10/08/13   Lenda Kelp, MD  furosemide (LASIX) 20  MG tablet Take 1 tablet (20 mg total) by mouth daily. 05/14/13   Doris Cheadle, MD  glucose blood (BL TEST STRIP PACK) test strip Use as instructed 04/08/13   Nishant Dhungel, MD  HYDROcodone-acetaminophen (NORCO) 5-325 MG per tablet Take 1 tablet by mouth every 6 (six) hours as needed. 11/12/13   April K Palumbo-Rasch, MD  HYDROcodone-acetaminophen (NORCO) 5-325 MG per tablet Take 1-2 tablets by mouth every 6 (six) hours as needed. 11/28/13   Geoffery Lyons, MD  insulin NPH-regular (NOVOLIN 70/30) (70-30) 100 UNIT/ML injection Inject 13 Units into the skin 2 (two) times  daily with a meal. 04/08/13   Nishant Dhungel, MD  Lancets (ACCU-CHEK MULTICLIX) lancets Use as instructed 04/08/13   Nishant Dhungel, MD  metFORMIN (GLUCOPHAGE) 500 MG tablet Take 2 tablets (1,000 mg total) by mouth 2 (two) times daily with a meal. 04/08/13   Nishant Dhungel, MD  omeprazole (PRILOSEC OTC) 20 MG tablet Take 20 mg by mouth daily.    Historical Provider, MD  predniSONE (STERAPRED UNI-PAK) 10 MG tablet 6 tabs po day 1, 5 tabs po day 2, 4 tabs po day 3, 3 tabs po day 4, 2 tabs po day 5, 1 tab po day 6 10/08/13   Lenda Kelp, MD  promethazine (PHENERGAN) 12.5 MG suppository Place 1 suppository (12.5 mg total) rectally every 6 (six) hours as needed for nausea or vomiting. 11/12/13   April K Palumbo-Rasch, MD   Triage Vitals: BP 114/66  Pulse 99  Temp(Src) 97.6 F (36.4 C) (Oral)  Resp 22  Ht  (1.549 m)  Wt 330 lb (149.687 kg)  BMI 62.39 kg/m2  SpO2 96%  Physical Exam  Nursing note and vitals reviewed. Constitutional: She appears well-developed and well-nourished. No distress.  HENT:  Head: Normocephalic and atraumatic.  Mouth/Throat: Oropharyngeal exudate (L tonsillar bed) present.  Uvula midline  Eyes: Conjunctivae and EOM are normal. Pupils are equal, round, and reactive to light. Right eye exhibits no discharge. Left eye exhibits no discharge. No scleral icterus.  Neck: Normal range of motion. Neck supple. No JVD present. No thyromegaly present.  Cardiovascular: Normal rate, regular rhythm, normal heart sounds and intact distal pulses.  Exam reveals no gallop and no friction rub.   No murmur heard. Pulmonary/Chest: Effort normal and breath sounds normal. No respiratory distress. She has no wheezes. She has no rales.  Abdominal: Soft. Bowel sounds are normal. She exhibits no distension and no mass. There is no tenderness.  Musculoskeletal: Normal range of motion. She exhibits no edema and no tenderness.  Lymphadenopathy:    She has no cervical adenopathy.   Neurological: She is alert. Coordination normal.  Skin: Skin is warm and dry. Rash (cellulitis on posterior R leg) noted. No erythema.  Psychiatric: She has a normal mood and affect. Her behavior is normal.    ED Course  Procedures (including critical care time)  DIAGNOSTIC STUDIES: Oxygen Saturation is 96% on room air, normal by my interpretation.    COORDINATION OF CARE:  3:53 PM- Discussed treatment plan with patient, and the patient agreed to the plan. The plan includes a chest x-ray and penicillin IM.   Labs Review Labs Reviewed  RAPID STREP SCREEN - Abnormal; Notable for the following:    Streptococcus, Group A Screen (Direct) POSITIVE (*)    All other components within normal limits    Imaging Review No results found.   EKG Interpretation None      MDM   Final diagnoses:  Strep  pharyngitis  Cellulitis of right leg    43 year old female here with URI symptoms of sore throat, fever, nonproductive cough. She has some mild left submandibular lymphadenopathy and some exudates on her left tonsil. Strep screen positive. Given penicillin. Also describes rash on her right lower leg. Small amount of cellulitis on R leg, was treated with Keflex previously, will give bactrim today. Resource guide provided for f/u. Stable for discharge.  I personally performed the services described in this documentation, which was scribed in my presence. The recorded information has been reviewed and is accurate.     Elwin Mocha, MD 12/15/13 [phone]

## 2013-12-15 NOTE — ED Notes (Signed)
PT discharged to home with family. NAD. 

## 2013-12-15 NOTE — ED Notes (Signed)
Reports head pressure, cough, sore throat since earlier this week. Non productive cough.

## 2013-12-15 NOTE — Discharge Instructions (Signed)
Pharyngitis °Pharyngitis is redness, pain, and swelling (inflammation) of your pharynx.  °CAUSES  °Pharyngitis is usually caused by infection. Most of the time, these infections are from viruses (viral) and are part of a cold. However, sometimes pharyngitis is caused by bacteria (bacterial). Pharyngitis can also be caused by allergies. Viral pharyngitis may be spread from person to person by coughing, sneezing, and personal items or utensils (cups, forks, spoons, toothbrushes). Bacterial pharyngitis may be spread from person to person by more intimate contact, such as kissing.  °SIGNS AND SYMPTOMS  °Symptoms of pharyngitis include:   °· Sore throat.   °· Tiredness (fatigue).   °· Low-grade fever.   °· Headache. °· Joint pain and muscle aches. °· Skin rashes. °· Swollen lymph nodes. °· Plaque-like film on throat or tonsils (often seen with bacterial pharyngitis). °DIAGNOSIS  °Your health care provider will ask you questions about your illness and your symptoms. Your medical history, along with a physical exam, is often all that is needed to diagnose pharyngitis. Sometimes, a rapid strep test is done. Other lab tests may also be done, depending on the suspected cause.  °TREATMENT  °Viral pharyngitis will usually get better in 3-4 days without the use of medicine. Bacterial pharyngitis is treated with medicines that kill germs (antibiotics).  °HOME CARE INSTRUCTIONS  °· Drink enough water and fluids to keep your urine clear or pale yellow.   °· Only take over-the-counter or prescription medicines as directed by your health care provider:   °¨ If you are prescribed antibiotics, make sure you finish them even if you start to feel better.   °¨ Do not take aspirin.   °· Get lots of rest.   °· Gargle with 8 oz of salt water (½ tsp of salt per 1 qt of water) as often as every 1-2 hours to soothe your throat.   °· Throat lozenges (if you are not at risk for choking) or sprays may be used to soothe your throat. °SEEK MEDICAL  CARE IF:  °· You have large, tender lumps in your neck. °· You have a rash. °· You cough up green, yellow-brown, or bloody spit. °SEEK IMMEDIATE MEDICAL CARE IF:  °· Your neck becomes stiff. °· You drool or are unable to swallow liquids. °· You vomit or are unable to keep medicines or liquids down. °· You have severe pain that does not go away with the use of recommended medicines. °· You have trouble breathing (not caused by a stuffy nose). °MAKE SURE YOU:  °· Understand these instructions. °· Will watch your condition. °· Will get help right away if you are not doing well or get worse. °Document Released: 04/04/2005 Document Revised: 01/23/2013 Document Reviewed: 12/10/2012 °ExitCare® Patient Information ©2015 [employer]. This information is not intended to replace advice given to you by your health care provider. Make sure you discuss any questions you have with your health care provider. ° ° °Emergency Department Resource Guide °1) Find a Doctor and Pay Out of Pocket °Although you won't have to find out who is covered by your insurance plan, it is a good idea to ask around and get recommendations. You will then need to call the office and see if the doctor you have chosen will accept you as a new patient and what types of options they offer for patients who are self-pay. Some doctors offer discounts or will set up payment plans for their patients who do not have insurance, but you will need to ask so you aren't surprised when you get to your appointment. ° °  2) Contact Your Local Health Department °Not all health departments have doctors that can see patients for sick visits, but many do, so it is worth a call to see if yours does. If you don't know where your local health department is, you can check in your phone book. The CDC also has a tool to help you locate your state's health department, and many state websites also have listings of all of their local health departments. ° °3) Find a Walk-in Clinic °If  your illness is not likely to be very severe or complicated, you may want to try a walk in clinic. These are popping up all over the country in pharmacies, drugstores, and shopping centers. They're usually staffed by nurse practitioners or physician assistants that have been trained to treat common illnesses and complaints. They're usually fairly quick and inexpensive. However, if you have serious medical issues or chronic medical problems, these are probably not your best option. ° °No Primary Care Doctor: °- Call [facility] at  832-8000 - they can help you locate a primary care doctor that  accepts your insurance, provides certain services, etc. °- Physician Referral Service- 1-800-533-3463 ° °Chronic Pain Problems: °Organization         Address  Phone   Notes  °[facility]  (336) 297-2271 Patients need to be referred by their primary care doctor.  ° °Medication Assistance: °Organization         Address  Phone   Notes  °[place] [facility] [address] °[place], [place] [address] (336) 641-8030 --Must be a resident of [place] °-- Must have NO insurance coverage whatsoever (no Medicaid/ Medicare, etc.) °-- The pt. MUST have a primary care doctor that directs their care regularly and follows them in the community °  °[facility]  (866) 331-1348   °[employer]  (888) 892-1162   ° °Agencies that provide inexpensive medical care: °Organization         Address  Phone   Notes  °[facility]  (336) 832-8035   °[facility]    (336) 832-7272   °[facility] [address] °[place], [place] [address] (336) 832-4777   °[facility] [address], °[place] (336) 271-4999   °[facility]    (336) 373-0678   °[facility]    (336) 272-1050   °[facility] ° [address], [place] Phone:  (336) 832-4444, Fax:  (336) 832-4440 Hours of  Operation:  9 am - 6 pm, M-F.  Also accepts Medicaid/Medicare and self-pay.  °[facility] ° [address], [place] Phone: (336) 832-3150, Fax: (336) [phone]. Hours of Operation:  8:30 am - 5:30 pm, M-F.  Also accepts Medicaid and self-pay.  °[facility] [place] [address], [place] Phone: (336) 878-6027   °[facility] [address], [place], [place] (336)723-1848, Ext. 123 Mondays & Thursdays: 7-9 AM.  First 15 patients are seen on a first come, first serve basis. °  ° °Medicaid-accepting [place] Providers: ° °Organization         Address  Phone   Notes  °[facility] [address], [place] (336) 641-2100 Also accepts self-pay patients.  °[facility] [address], [place] ° (336) 856-9996   °[facility] [address], [place] (336) 288-8857   °[facility] [address] [place] [address], [place] (336) 299-7000   °Veita Bland [address], [place]  ° (336) 373-1557 Only accepts [place] Access Medicaid patients after they have their name applied to their card.  ° °Self-Pay (no insurance) in [place]: ° °Organization         Address  Phone   Notes  °Sickle Cell Patients, [facility] [address], [place] (336) 832-1970   °[facility] [address], [place] (336) 832-4400   °[facility] ° [address], [place] (336) 992-4800   °[facility]/Dr. Osei-Bonsu ° [address] [place] [address], [place] or [address], [place] (336) 841-8500 Phone number for both [place] and [place] locations is the same.  °[facility] [address], [place] (336) 299-0000   °[facility] [address] [place] [address], [place] or [address] (336) 852-7530 °(336) 878-2260   °[facility] [address], [place] (336) 350-1642, phone; (336) 294-5005, fax Sees patients 1st and 3rd Saturday of every month.  Must not qualify for public or private insurance (i.e. Medicaid, Medicare, [place] Health Choice, Veterans' Benefits) • Household income should be no more than 200% of the poverty level •The clinic cannot treat you if you are pregnant or think you are pregnant • Sexually transmitted diseases are not treated at the clinic.  ° ° °Dental Care: °Organization         Address  Phone  Notes  °[facility] [address], [place] (336) 641-6152 Accepts children up to age 21 who are enrolled in Medicaid or [place] Health Choice; pregnant women with a Medicaid card; and children who have applied for Medicaid or [place] Health Choice, but were declined, whose parents can pay a reduced fee at time of service.  °[facility] [place]  [address], [place] (336) 641-7733 Accepts children up to age 21 who are enrolled in Medicaid or [place] Health Choice; pregnant women with a Medicaid card; and children who have applied for Medicaid or [place] Health Choice, but were declined, whose parents can pay a reduced fee at time of service.  °[facility] ° [address], [place] (336) 641-4533 Patients are seen by appointment only. Walk-ins are not accepted. [facility] will see patients 18 years of age and older. °Monday - Tuesday (8am-5pm) °Most Wednesdays (8:30-5pm) °$30 per visit, cash only  °[facility] ° [address], [place] (336) 641-4533 Patients are seen by appointment only. Walk-ins are not accepted. [facility] will see patients 18 years of age and older. °One Wednesday Evening (Monthly: Volunteer Based).  $30 per visit, cash only  °[facility]  (919) 537-3737 for adults; Children under age 4, call Graduate Pediatric  Dentistry at ([phone]) [phone]. Children aged 4-14, please call ([phone]) [phone] to request a pediatric application. ° Dental services are provided in all areas of dental care including fillings, crowns and bridges, complete and partial dentures, implants, gum treatment, root canals, and extractions. Preventive care is also provided. Treatment is provided to both adults and children. °Patients are selected via a lottery and there is often a waiting list. °  °[facility] [address], °  [place] ° (336) 763-8833 [email or web address] °  °[facility] [address], [place], [place] (336)723-1848, Ext. 123 Second and Fourth Thursday of each month, opens at 6:30 AM; Clinic ends at 9 AM.  Patients are seen on a first-come first-served basis, and a limited number are seen during each clinic.  ° °[facility] ° [address], [place], [place] (336) 723-7904   Eligibility Requirements °You must have lived in [place], [place], or [place] counties for at least the last three months. °  You cannot be eligible for state or federal sponsored healthcare insurance, including Veterans Administration, Medicaid, or Medicare. °  You generally cannot be eligible for healthcare insurance through your employer.  °  How to apply: °Eligibility screenings are held every Tuesday and Wednesday afternoon from 1:00 pm until 4:00 pm. You do not need an appointment for the interview!  °[facility] [address], [place], [place] 336-631-2330   °[facility]  336-342-8273   °[facility]  336-703-3100   °[facility]  336-570-6415   ° °Behavioral Health Resources in the Community: °Intensive Outpatient Programs °Organization         Address  Phone  Notes  °[facility] [address], [place], [place] 336-878-6098   °[facility] [address], [place], [place] 336-832-9800   °[facility] [address], [place], [place] ° 336-882-2125   °[facility] [address],  °[place], [place] 1-800-853-5163 or 336-641-4981   °Substance Abuse Resources °Organization         Address  Phone  Notes  °[facility]  336-882-2125   °[facility]  336-784-9470   °[facility]  336-285-9073   °[facility]  336-845-3988   °Residential & Outpatient Substance Abuse Program  1-800-659-3381   °Psychological Services °Organization         Address  Phone  Notes  °[facility]  336- 832-9600   °[facility]  336- 378-7881   °[facility] [address], [place] 1-800-853-5163 or 336-641-4981   ° °Mobile Crisis Teams °Organization         Address  Phone  Notes  °[facility], Mobile Crisis Care Unit  1-877-626-1772   °Assertive °[facility] ° [address] [place] , [place] 336-834-9664   °Sharon DeEsch [address] °[place] 336-554-5454   ° °Self-Help/Support Groups °Organization         Address  Phone             Notes  °[facility] [place] - variety of support groups  336- 373-1402 Call for more information  °Narcotics Anonymous (NA), [facility] [address], °[place]  2 meetings at this location  ° °Residential Treatment Programs °Organization         Address  Phone  Notes  °[facility] [address],    °[place]  1-866-801-8205   °[facility] ° [address], [place], [place] 704-293-8524   °[facility] [address], [place] 336-845-3988 Admissions: 8am-3pm M-F  °[facility] [address]    °[place], [place] 336-841-1104   °[facility] [address], , [place] 336-379-7146   °[facility] [address]  °,  336-285-9073   °[facility] - Intensive Outpatient [address],  [place], [place] 336-852-3033   °[facility] [address]  °[place], [place] 1-877-615-2722 or 336-784-9470   °[facility] [address] [place], [place] 336-227-7417 Accepts Medicaid  °[facility] [address]  °[place] 1-800-659-3381 Substance Abuse/Addiction Treatment  ° °[place] Behavioral Health Resources °Organization         Address  Phone  Notes  °[facility]  (888) 581-9988   °Julie Brannon, PhD [address] [place], [place]   (336) 349-5553 or (336) 951-0000   °[facility]   [address] °[place], [place] (336) 349-4454   °[facility] [address], [place], [place] (336) 342-8316 Insurance/Medicaid/sponsorship through [facility]  °[facility] [address]                                    [place], [place] (336) 342-8316 Therapy/tele-psych/case  °[facility] [address]  ° [place], [place] (336) 349-2233    °Dr. Arfeen  (336) 349-4544   °[facility] [place]  [employer] [place] [facility] 1) [address], [place] °2) [address], [place] °3)  [address], [place] (336) 349-3220 °(336) 342-7768 ° °(336) 342-8140   °[place] [facility] (336) 342-1394 or (336) 342-3537 (After Hours)    ° ° ° ° °

## 2013-12-30 ENCOUNTER — Emergency Department (HOSPITAL_BASED_OUTPATIENT_CLINIC_OR_DEPARTMENT_OTHER)
Admission: EM | Admit: 2013-12-30 | Discharge: 2013-12-31 | Disposition: A | Discharge status: Home / Self Care | Payer: No Typology Code available for payment source | Attending: Emergency Medicine | Admitting: Emergency Medicine

## 2013-12-30 ENCOUNTER — Encounter (HOSPITAL_BASED_OUTPATIENT_CLINIC_OR_DEPARTMENT_OTHER): Payer: Self-pay | Admitting: Emergency Medicine

## 2013-12-30 DIAGNOSIS — R110 Nausea: Secondary | ICD-10-CM

## 2013-12-30 DIAGNOSIS — K219 Gastro-esophageal reflux disease without esophagitis: Secondary | ICD-10-CM | POA: Insufficient documentation

## 2013-12-30 DIAGNOSIS — R1031 Right lower quadrant pain: Secondary | ICD-10-CM

## 2013-12-30 DIAGNOSIS — B373 Candidiasis of vulva and vagina: Secondary | ICD-10-CM

## 2013-12-30 DIAGNOSIS — A499 Bacterial infection, unspecified: Secondary | ICD-10-CM | POA: Insufficient documentation

## 2013-12-30 DIAGNOSIS — Z792 Long term (current) use of antibiotics: Secondary | ICD-10-CM | POA: Insufficient documentation

## 2013-12-30 DIAGNOSIS — E119 Type 2 diabetes mellitus without complications: Secondary | ICD-10-CM | POA: Insufficient documentation

## 2013-12-30 DIAGNOSIS — Z794 Long term (current) use of insulin: Secondary | ICD-10-CM | POA: Insufficient documentation

## 2013-12-30 DIAGNOSIS — Z87891 Personal history of nicotine dependence: Secondary | ICD-10-CM | POA: Insufficient documentation

## 2013-12-30 DIAGNOSIS — E1165 Type 2 diabetes mellitus with hyperglycemia: Secondary | ICD-10-CM

## 2013-12-30 DIAGNOSIS — N760 Acute vaginitis: Secondary | ICD-10-CM | POA: Insufficient documentation

## 2013-12-30 DIAGNOSIS — R1032 Left lower quadrant pain: Secondary | ICD-10-CM | POA: Insufficient documentation

## 2013-12-30 DIAGNOSIS — Z791 Long term (current) use of non-steroidal anti-inflammatories (NSAID): Secondary | ICD-10-CM | POA: Insufficient documentation

## 2013-12-30 DIAGNOSIS — N390 Urinary tract infection, site not specified: Secondary | ICD-10-CM | POA: Insufficient documentation

## 2013-12-30 DIAGNOSIS — B3731 Acute candidiasis of vulva and vagina: Secondary | ICD-10-CM | POA: Insufficient documentation

## 2013-12-30 DIAGNOSIS — B9689 Other specified bacterial agents as the cause of diseases classified elsewhere: Secondary | ICD-10-CM | POA: Insufficient documentation

## 2013-12-30 LAB — URINALYSIS, ROUTINE W REFLEX MICROSCOPIC
Bilirubin Urine: NEGATIVE
Glucose, UA: >1000 mg/dL — AB
Hgb urine dipstick: NEGATIVE
Ketones, ur: NEGATIVE mg/dL
Nitrite: NEGATIVE
Protein, ur: NEGATIVE mg/dL
Specific Gravity, Urine: 1.037 — ABNORMAL HIGH (ref 1.005–1.030)
Urobilinogen, UA: 0.2 mg/dL (ref 0.0–1.0)
pH: 5 (ref 5.0–8.0)

## 2013-12-30 LAB — URINE MICROSCOPIC-ADD ON

## 2013-12-30 NOTE — ED Notes (Signed)
Right lower quad pain on and off for a month.

## 2013-12-31 LAB — CBC WITH DIFFERENTIAL/PLATELET
Basophils Absolute: 0.1 10*3/uL (ref 0.0–0.1)
Basophils Relative: 0 % (ref 0–1)
Eosinophils Absolute: 0.4 10*3/uL (ref 0.0–0.7)
Eosinophils Relative: 3 % (ref 0–5)
HCT: 38.6 % (ref 36.0–46.0)
Hemoglobin: 12.8 g/dL (ref 12.0–15.0)
Lymphocytes Relative: 21 % (ref 12–46)
Lymphs Abs: 2.4 10*3/uL (ref 0.7–4.0)
MCH: 27.8 pg (ref 26.0–34.0)
MCHC: 33.2 g/dL (ref 30.0–36.0)
MCV: 83.9 fL (ref 78.0–100.0)
Monocytes Absolute: 0.8 10*3/uL (ref 0.1–1.0)
Monocytes Relative: 7 % (ref 3–12)
Neutro Abs: 7.9 10*3/uL — ABNORMAL HIGH (ref 1.7–7.7)
Neutrophils Relative %: 69 % (ref 43–77)
Platelets: 344 10*3/uL (ref 150–400)
RBC: 4.6 MIL/uL (ref 3.87–5.11)
RDW: 14.2 % (ref 11.5–15.5)
WBC: 11.5 10*3/uL — ABNORMAL HIGH (ref 4.0–10.5)

## 2013-12-31 LAB — COMPREHENSIVE METABOLIC PANEL
ALT: 18 U/L (ref 0–35)
AST: 17 U/L (ref 0–37)
Albumin: 2.9 g/dL — ABNORMAL LOW (ref 3.5–5.2)
Alkaline Phosphatase: 106 U/L (ref 39–117)
Anion gap: 14 (ref 5–15)
BUN: 10 mg/dL (ref 6–23)
CO2: 25 mEq/L (ref 19–32)
Calcium: 9.3 mg/dL (ref 8.4–10.5)
Chloride: 98 mEq/L (ref 96–112)
Creatinine, Ser: 0.6 mg/dL (ref 0.50–1.10)
GFR calc Af Amer: >90 mL/min (ref >90)
GFR calc non Af Amer: >90 mL/min (ref >90)
Glucose, Bld: 348 mg/dL — ABNORMAL HIGH (ref 70–99)
Potassium: 3.8 mEq/L (ref 3.7–5.3)
Sodium: 137 mEq/L (ref 137–147)
Total Bilirubin: 0.3 mg/dL (ref 0.3–1.2)
Total Protein: 7.1 g/dL (ref 6.0–8.3)

## 2013-12-31 LAB — WET PREP, GENITAL
Trich, Wet Prep: NONE SEEN
Yeast Wet Prep HPF POC: NONE SEEN

## 2013-12-31 LAB — RPR

## 2013-12-31 LAB — HIV ANTIBODY (ROUTINE TESTING W REFLEX): HIV 1&2 Ab, 4th Generation: NONREACTIVE

## 2013-12-31 MED ORDER — SULFAMETHOXAZOLE-TMP DS 800-160 MG PO TABS: 1.0000 | ORAL_TABLET | Freq: Two times a day (BID) | ORAL | Status: DC

## 2013-12-31 MED ORDER — FLUCONAZOLE 50 MG PO TABS
150.0000 mg | ORAL_TABLET | Freq: Once | ORAL | Status: AC
  Administered 2013-12-31: 150 mg via ORAL
  Filled 2013-12-31 (×2): qty 1

## 2013-12-31 MED ORDER — FLUCONAZOLE 150 MG PO TABS: 150.0000 mg | ORAL_TABLET | Freq: Once | ORAL | Status: DC

## 2013-12-31 MED ORDER — CEFTRIAXONE SODIUM 250 MG IJ SOLR
250.0000 mg | Freq: Once | INTRAMUSCULAR | Status: AC
  Administered 2013-12-31: 250 mg via INTRAMUSCULAR
  Filled 2013-12-31: qty 250

## 2013-12-31 MED ORDER — HYDROCODONE-ACETAMINOPHEN 5-325 MG PO TABS: 1.0000 | ORAL_TABLET | Freq: Four times a day (QID) | ORAL | Status: DC | PRN

## 2013-12-31 MED ORDER — METRONIDAZOLE 500 MG PO TABS: 500.0000 mg | ORAL_TABLET | Freq: Two times a day (BID) | ORAL | Status: DC

## 2013-12-31 MED ORDER — ONDANSETRON HCL 4 MG/2ML IJ SOLN
4.0000 mg | Freq: Once | INTRAMUSCULAR | Status: AC
  Administered 2013-12-31: 4 mg via INTRAVENOUS
  Filled 2013-12-31: qty 2

## 2013-12-31 MED ORDER — MORPHINE SULFATE 4 MG/ML IJ SOLN
4.0000 mg | Freq: Once | INTRAMUSCULAR | Status: AC
  Administered 2013-12-31: 4 mg via INTRAVENOUS
  Filled 2013-12-31: qty 1

## 2013-12-31 MED ORDER — NAPROXEN 500 MG PO TABS: 500.0000 mg | ORAL_TABLET | Freq: Two times a day (BID) | ORAL | Status: DC | PRN

## 2013-12-31 MED ORDER — AZITHROMYCIN 250 MG PO TABS
1000.0000 mg | ORAL_TABLET | Freq: Once | ORAL | Status: AC
  Administered 2013-12-31: 1000 mg via ORAL
  Filled 2013-12-31: qty 4

## 2013-12-31 NOTE — ED Notes (Signed)
PA at bedside.

## 2013-12-31 NOTE — ED Provider Notes (Signed)
Medical screening examination/treatment/procedure(s) were performed by non-physician practitioner and as supervising physician I was immediately available for consultation/collaboration.   EKG Interpretation None        Aurilla Coulibaly K Shrita Thien-Rasch, MD 12/31/13 [phone]

## 2013-12-31 NOTE — ED Notes (Signed)
Family at bedside. 

## 2013-12-31 NOTE — ED Notes (Signed)
Pt ambulatory to restroom

## 2013-12-31 NOTE — Discharge Instructions (Signed)
Stay very well hydrated with plenty of water throughout the day. Take antibiotic Bactrim until completed for your urinary tract infection, and use the diflucan pill as directed on 01/07/14 to help clear up any remaining yeast infection. You were given one dose here in the ER today. You also have bacterial vaginosis, take metronidazole as directed but don't drink alcohol while taking this medication. You need to control your sugars better to help resolve this ongoing belly pain since it's likely related to ongoing yeast infections and urine infections. Use naprosyn and norco as directed, as needed for pain. Follow up with primary care physician in 1 week for recheck of ongoing symptoms but return to ER for emergent changing or worsening of symptoms. Please seek immediate care if you develop the following: You develop back pain.  Your symptoms are no better, or worse in 3 days. There is severe back pain or lower abdominal pain.  You develop chills.  You have a fever.  There is nausea or vomiting.  There is continued burning or discomfort with urination.   Follow up with [facility] for future STD concerns or screenings. You have been treated for gonorrhea and chlamydia in the ER but the hospital will call you if lab is positive. You were tested for HIV and Syphilis, and the hospital will call you if the lab is positive.   Abdominal Pain Many things can cause belly (abdominal) pain. Most times, the belly pain is not dangerous. Many cases of belly pain can be watched and treated at home. HOME CARE   Do not take medicines that help you go poop (laxatives) unless told to by your doctor.  Only take medicine as told by your doctor.  Eat or drink as told by your doctor. Your doctor will tell you if you should be on a special diet. GET HELP IF:  You do not know what is causing your belly pain.  You have belly pain while you are sick to your stomach (nauseous) or have  runny poop (diarrhea).  You have pain while you pee or poop.  Your belly pain wakes you up at night.  You have belly pain that gets worse or better when you eat.  You have belly pain that gets worse when you eat fatty foods.  You have a fever. GET HELP RIGHT AWAY IF:   The pain does not go away within 2 hours.  You keep throwing up (vomiting).  The pain changes and is only in the right or left part of the belly.  You have bloody or tarry looking poop. MAKE SURE YOU:   Understand these instructions.  Will watch your condition.  Will get help right away if you are not doing well or get worse. Document Released: 09/21/2007 Document Revised: 04/09/2013 Document Reviewed: 12/12/2012 [facility] Patient Information 2015 [place], [place]. This information is not intended to replace advice given to you by your health care provider. Make sure you discuss any questions you have with your health care provider.  Candida Infection A Candida infection (also called yeast, fungus, and Monilia infection) is an overgrowth of yeast that can occur anywhere on the body. A yeast infection commonly occurs in warm, moist body areas. Usually, the infection remains localized but can spread to become a systemic infection. A yeast infection may be a sign of a more severe disease such as diabetes, leukemia, or AIDS. A yeast infection can occur in both men and women. In women, Candida vaginitis  is a vaginal infection. It is one of the most common causes of vaginitis. Men usually do not have symptoms or know they have an infection until other problems develop. Men may find out they have a yeast infection because their sex partner has a yeast infection. Uncircumcised men are more likely to get a yeast infection than circumcised men. This is because the uncircumcised glans is not exposed to air and does not remain as dry as that of a circumcised glans. Older adults may develop yeast infections around dentures. CAUSES    Women  Antibiotics.  Steroid medication taken for a long time.  Being overweight (obese).  Diabetes.  Poor immune condition.  Certain serious medical conditions.  Immune suppressive medications for organ transplant patients.  Chemotherapy.  Pregnancy.  Menstruation.  Stress and fatigue.  Intravenous drug use.  Oral contraceptives.  Wearing tight-fitting clothes in the crotch area.  Catching it from a sex partner who has a yeast infection.  Spermicide.  Intravenous, urinary, or other catheters. Men  Catching it from a sex partner who has a yeast infection.  Having oral or anal sex with a person who has the infection.  Spermicide.  Diabetes.  Antibiotics.  Poor immune system.  Medications that suppress the immune system.  Intravenous drug use.  Intravenous, urinary, or other catheters. SYMPTOMS  Women  Thick, white vaginal discharge.  Vaginal itching.  Redness and swelling in and around the vagina.  Irritation of the lips of the vagina and perineum.  Blisters on the vaginal lips and perineum.  Painful sexual intercourse.  Low blood sugar (hypoglycemia).  Painful urination.  Bladder infections.  Intestinal problems such as constipation, indigestion, bad breath, bloating, increase in gas, diarrhea, or loose stools. Men  Men may develop intestinal problems such as constipation, indigestion, bad breath, bloating, increase in gas, diarrhea, or loose stools.  Dry, cracked skin on the penis with itching or discomfort.  Jock itch.  Dry, flaky skin.  Athlete's foot.  Hypoglycemia. DIAGNOSIS  Women  A history and an exam are performed.  The discharge may be examined under a microscope.  A culture may be taken of the discharge. Men  A history and an exam are performed.  Any discharge from the penis or areas of cracked skin will be looked at under the microscope and cultured.  Stool samples may be cultured. TREATMENT   Women  Vaginal antifungal suppositories and creams.  Medicated creams to decrease irritation and itching on the outside of the vagina.  Warm compresses to the perineal area to decrease swelling and discomfort.  Oral antifungal medications.  Medicated vaginal suppositories or cream for repeated or recurrent infections.  Wash and dry the irritation areas before applying the cream.  Eating yogurt with Lactobacillus may help with prevention and treatment.  Sometimes painting the vagina with gentian violet solution may help if creams and suppositories do not work. Men  Antifungal creams and oral antifungal medications.  Sometimes treatment must continue for 30 days after the symptoms go away to prevent recurrence. HOME CARE INSTRUCTIONS  Women  Use cotton underwear and avoid tight-fitting clothing.  Avoid colored, scented toilet paper and deodorant tampons or pads.  Do not douche.  Keep your diabetes under control.  Finish all the prescribed medications.  Keep your skin clean and dry.  Consume milk or yogurt with Lactobacillus-active culture regularly. If you get frequent yeast infections and think that is what the infection is, there are over-the-counter medications that you can get. If the  infection does not show healing in 3 days, talk to your caregiver.  Tell your sex partner you have a yeast infection. Your partner may need treatment also, especially if your infection does not clear up or recurs. Men  Keep your skin clean and dry.  Keep your diabetes under control.  Finish all prescribed medications.  Tell your sex partner that you have a yeast infection so he or she can be treated if necessary. SEEK MEDICAL CARE IF:   Your symptoms do not clear up or worsen in one week after treatment.  You have an oral temperature above 102 F (38.9 C).  You have trouble swallowing or eating for a prolonged time.  You develop blisters on and around your vagina.  You  develop vaginal bleeding and it is not your menstrual period.  You develop abdominal pain.  You develop intestinal problems as mentioned above.  You get weak or light-headed.  You have painful or increased urination.  You have pain during sexual intercourse. MAKE SURE YOU:   Understand these instructions.  Will watch your condition.  Will get help right away if you are not doing well or get worse. Document Released: 05/12/2004 Document Revised: 08/19/2013 Document Reviewed: 08/24/2009 [facility] Patient Information 2015 [place], [place]. This information is not intended to replace advice given to you by your health care provider. Make sure you discuss any questions you have with your health care provider.  Diabetes and Exercise Exercising regularly is important. It is not just about losing weight. It has many health benefits, such as:  Improving your overall fitness, flexibility, and endurance.  Increasing your bone density.  Helping with weight control.  Decreasing your body fat.  Increasing your muscle strength.  Reducing stress and tension.  Improving your overall health. People with diabetes who exercise gain additional benefits because exercise:  Reduces appetite.  Improves the body's use of blood sugar (glucose).  Helps lower or control blood glucose.  Decreases blood pressure.  Helps control blood lipids (such as cholesterol and triglycerides).  Improves the body's use of the hormone insulin by:  Increasing the body's insulin sensitivity.  Reducing the body's insulin needs.  Decreases the risk for heart disease because exercising:  Lowers cholesterol and triglycerides levels.  Increases the levels of good cholesterol (such as high-density lipoproteins [HDL]) in the body.  Lowers blood glucose levels. YOUR ACTIVITY PLAN  Choose an activity that you enjoy and set realistic goals. Your health care provider or diabetes educator can help you make an  activity plan that works for you. Exercise regularly as directed by your health care provider. This includes:  Performing resistance training twice a week such as push-ups, sit-ups, lifting weights, or using resistance bands.  Performing 150 minutes of cardio exercises each week such as walking, running, or playing sports.  Staying active and spending no more than 90 minutes at one time being inactive. Even short bursts of exercise are good for you. Three 10-minute sessions spread throughout the day are just as beneficial as a single 30-minute session. Some exercise ideas include:  Taking the dog for a walk.  Taking the stairs instead of the elevator.  Dancing to your favorite song.  Doing an exercise video.  Doing your favorite exercise with a friend. RECOMMENDATIONS FOR EXERCISING WITH TYPE 1 OR TYPE 2 DIABETES   Check your blood glucose before exercising. If blood glucose levels are greater than 240 mg/dL, check for urine ketones. Do not exercise if ketones are present.  Avoid  injecting insulin into areas of the body that are going to be exercised. For example, avoid injecting insulin into:  The arms when playing tennis.  The legs when jogging.  Keep a record of:  Food intake before and after you exercise.  Expected peak times of insulin action.  Blood glucose levels before and after you exercise.  The type and amount of exercise you have done.  Review your records with your health care provider. Your health care provider will help you to develop guidelines for adjusting food intake and insulin amounts before and after exercising.  If you take insulin or oral hypoglycemic agents, watch for signs and symptoms of hypoglycemia. They include:  Dizziness.  Shaking.  Sweating.  Chills.  Confusion.  Drink plenty of water while you exercise to prevent dehydration or heat stroke. Body water is lost during exercise and must be replaced.  Talk to your health care  provider before starting an exercise program to make sure it is safe for you. Remember, almost any type of activity is better than none. Document Released: 06/25/2003 Document Revised: 08/19/2013 Document Reviewed: 09/11/2012 [facility] Patient Information 2015 [place], [place]. This information is not intended to replace advice given to you by your health care provider. Make sure you discuss any questions you have with your health care provider.  High Blood Sugar High blood sugar (hyperglycemia) means that the level of sugar in your blood is higher than it should be. Signs of high blood sugar include:  Feeling thirsty.  Frequent peeing (urinating).  Feeling tired or sleepy.  Dry mouth.  Vision changes.  Feeling weak.  Feeling hungry but losing weight.  Numbness and tingling in your hands or feet.  Headache. When you ignore these signs, your blood sugar may keep going up. These problems may get worse, and other problems may begin. HOME CARE  Check your blood sugars as told by your doctor. Write down the numbers with the date and time.  Take the right amount of insulin or diabetes pills at the right time. Write down the dose with date and time.  Refill your insulin or diabetes pills before running out.  Watch what you eat. Follow your meal plan.  Drink liquids without sugar, such as water. Check with your doctor if you have kidney or heart disease.  Follow your doctor's orders for exercise. Exercise at the same time of day.  Keep your doctor's appointments. GET HELP RIGHT AWAY IF:   You have trouble thinking or are confused.  You have fast breathing with fruity smelling breath.  You pass out (faint).  You have 2 to 3 days of high blood sugars and you do not know why.  You have chest pain.  You are feeling sick to your stomach (nauseous) or throwing up (vomiting).  You have sudden vision changes. MAKE SURE YOU:   Understand these instructions.  Will watch your  condition.  Will get help right away if you are not doing well or get worse. Document Released: 01/30/2009 Document Revised: 06/27/2011 Document Reviewed: 01/30/2009 [facility] Patient Information 2015 [place], [place]. This information is not intended to replace advice given to you by your health care provider. Make sure you discuss any questions you have with your health care provider.  Hyperglycemia Hyperglycemia occurs when the glucose (sugar) in your blood is too high. Hyperglycemia can happen for many reasons, but it most often happens to people who do not know they have diabetes or are not managing their diabetes properly.  CAUSES  Whether you have diabetes or not, there are other causes of hyperglycemia. Hyperglycemia can occur when you have diabetes, but it can also occur in other situations that you might not be as aware of, such as: Diabetes  If you have diabetes and are having problems controlling your blood glucose, hyperglycemia could occur because of some of the following reasons:  Not following your meal plan.  Not taking your diabetes medications or not taking it properly.  Exercising less or doing less activity than you normally do.  Being sick. Pre-diabetes  This cannot be ignored. Before people develop Type 2 diabetes, they almost always have "pre-diabetes." This is when your blood glucose levels are higher than normal, but not yet high enough to be diagnosed as diabetes. Research has shown that some long-term damage to the body, especially the heart and circulatory system, may already be occurring during pre-diabetes. If you take action to manage your blood glucose when you have pre-diabetes, you may delay or prevent Type 2 diabetes from developing. Stress  If you have diabetes, you may be "diet" controlled or on oral medications or insulin to control your diabetes. However, you may find that your blood glucose is higher than usual in the hospital whether you have diabetes  or not. This is often referred to as "stress hyperglycemia." Stress can elevate your blood glucose. This happens because of hormones put out by the body during times of stress. If stress has been the cause of your high blood glucose, it can be followed regularly by your caregiver. That way he/she can make sure your hyperglycemia does not continue to get worse or progress to diabetes. Steroids  Steroids are medications that act on the infection fighting system (immune system) to block inflammation or infection. One side effect can be a rise in blood glucose. Most people can produce enough extra insulin to allow for this rise, but for those who cannot, steroids make blood glucose levels go even higher. It is not unusual for steroid treatments to "uncover" diabetes that is developing. It is not always possible to determine if the hyperglycemia will go away after the steroids are stopped. A special blood test called an A1c is sometimes done to determine if your blood glucose was elevated before the steroids were started. SYMPTOMS  Thirsty.  Frequent urination.  Dry mouth.  Blurred vision.  Tired or fatigue.  Weakness.  Sleepy.  Tingling in feet or leg. DIAGNOSIS  Diagnosis is made by monitoring blood glucose in one or all of the following ways:  A1c test. This is a chemical found in your blood.  Fingerstick blood glucose monitoring.  Laboratory results. TREATMENT  First, knowing the cause of the hyperglycemia is important before the hyperglycemia can be treated. Treatment may include, but is not be limited to:  Education.  Change or adjustment in medications.  Change or adjustment in meal plan.  Treatment for an illness, infection, etc.  More frequent blood glucose monitoring.  Change in exercise plan.  Decreasing or stopping steroids.  Lifestyle changes. HOME CARE INSTRUCTIONS   Test your blood glucose as directed.  Exercise regularly. Your caregiver will give you  instructions about exercise. Pre-diabetes or diabetes which comes on with stress is helped by exercising.  Eat wholesome, balanced meals. Eat often and at regular, fixed times. Your caregiver or nutritionist will give you a meal plan to guide your sugar intake.  Being at an ideal weight is important. If needed, losing as little as 10 to 15 pounds  may help improve blood glucose levels. SEEK MEDICAL CARE IF:   You have questions about medicine, activity, or diet.  You continue to have symptoms (problems such as increased thirst, urination, or weight gain). SEEK IMMEDIATE MEDICAL CARE IF:   You are vomiting or have diarrhea.  Your breath smells fruity.  You are breathing faster or slower.  You are very sleepy or incoherent.  You have numbness, tingling, or pain in your feet or hands.  You have chest pain.  Your symptoms get worse even though you have been following your caregiver's orders.  If you have any other questions or concerns. Document Released: 09/28/2000 Document Revised: 06/27/2011 Document Reviewed: 08/01/2011 [facility] Patient Information 2015 [place], [place]. This information is not intended to replace advice given to you by your health care provider. Make sure you discuss any questions you have with your health care provider.   Bacterial Vaginosis Bacterial vaginosis is a vaginal infection that occurs when the normal balance of bacteria in the vagina is disrupted. It results from an overgrowth of certain bacteria. This is the most common vaginal infection in women of childbearing age. Treatment is important to prevent complications, especially in pregnant women, as it can cause a premature delivery. CAUSES  Bacterial vaginosis is caused by an increase in harmful bacteria that are normally present in smaller amounts in the vagina. Several different kinds of bacteria can cause bacterial vaginosis. However, the reason that the condition develops is not fully  understood. RISK FACTORS Certain activities or behaviors can put you at an increased risk of developing bacterial vaginosis, including:  Having a new sex partner or multiple sex partners.  Douching.  Using an intrauterine device (IUD) for contraception. Women do not get bacterial vaginosis from toilet seats, bedding, swimming pools, or contact with objects around them. SIGNS AND SYMPTOMS  Some women with bacterial vaginosis have no signs or symptoms. Common symptoms include:  Grey vaginal discharge.  A fishlike odor with discharge, especially after sexual intercourse.  Itching or burning of the vagina and vulva.  Burning or pain with urination. DIAGNOSIS  Your health care provider will take a medical history and examine the vagina for signs of bacterial vaginosis. A sample of vaginal fluid may be taken. Your health care provider will look at this sample under a microscope to check for bacteria and abnormal cells. A vaginal pH test may also be done.  TREATMENT  Bacterial vaginosis may be treated with antibiotic medicines. These may be given in the form of a pill or a vaginal cream. A second round of antibiotics may be prescribed if the condition comes back after treatment.  HOME CARE INSTRUCTIONS   Only take over-the-counter or prescription medicines as directed by your health care provider.  If antibiotic medicine was prescribed, take it as directed. Make sure you finish it even if you start to feel better.  Do not have sex until treatment is completed.  Tell all sexual partners that you have a vaginal infection. They should see their health care provider and be treated if they have problems, such as a mild rash or itching.  Practice safe sex by using condoms and only having one sex partner. SEEK MEDICAL CARE IF:   Your symptoms are not improving after 3 days of treatment.  You have increased discharge or pain.  You have a fever. MAKE SURE YOU:   Understand these  instructions.  Will watch your condition.  Will get help right away if you are not doing well  or get worse. FOR MORE INFORMATION  Centers for Disease Control and Prevention, Division of STD Prevention: [employer] American Sexual Health Association (ASHA): www.ashastd.org  Document Released: 04/04/2005 Document Revised: 01/23/2013 Document Reviewed: 11/14/2012 [facility] Patient Information 2015 [place], [place]. This information is not intended to replace advice given to you by your health care provider. Make sure you discuss any questions you have with your health care provider.

## 2013-12-31 NOTE — ED Provider Notes (Signed)
CSN: [id number]     Arrival date & time 12/30/13  2140 History   First MD Initiated Contact with Patient 12/30/13 2345     Chief Complaint  Patient presents with  . Abdominal Pain     (Consider location/radiation/quality/duration/timing/severity/associated sxs/prior Treatment) HPI Comments: [name] is a 43 y.o. female with a PMHx of DM and GERD and a PSHx of cholecystectomy, BTL, and endometrial ablation, who presents to the ED with complaints of ongoing intermittent RLQ pain x2 months, for which she has been evaluated in the past and told it was cramps. Pt states the pain is sharp stabbing, 10/10, intermittent, gradual onset, radiating to suprapubic region, waxing and waning over the course of the 2 months, worse with movement and BMs, and with no alleviating factors and unrelieved with tylenol. Endorses associated watery diarrhea nonbloody 3x/day intermittently for 2 months, subjective nightly fevers to 100.6, warmth to the area of abdomen but no skin changes, intermittent nausea without emesis, occasional cloudy urine with increased freq/urgency and dribbling but no hematuria or dysuria. Denies diaphoresis, skin changes, CP, SOB, vomiting, hematemesis, hematochezia, melena, abd distension, obstipation, flank pain, dysuria, hematuria, vaginal discharge or bleeding, recent diet changes, recent travel, sick contacts, suspicious food intake, antibiotic use, or ETOH use. States she started metformin  BID 3 months ago. Reports her CBGs at home are ranging from 90-120s, takes it twice daily. Endorses using novolin 70/30 13U BID, states she's compliant. Does not have a menses regularly due to BTL and ablation.   Patient is a 43 y.o. female presenting with abdominal pain. The history is provided by the patient. No language interpreter was used.  Abdominal Pain Pain location:  RLQ and suprapubic Pain quality: sharp and stabbing   Pain radiates to:  Suprapubic region Pain severity:  Moderate  (10/10) Onset quality:  Gradual Duration:  8 weeks Timing:  Intermittent Progression:  Waxing and waning Chronicity:  Recurrent Context: not alcohol use, not diet changes, not laxative use, not recent illness, not recent sexual activity, not recent travel, not sick contacts and not suspicious food intake   Relieved by:  Nothing Worsened by:  Bowel movements and movement Ineffective treatments:  Acetaminophen Associated symptoms: diarrhea (watery, 3x/day, intermittent, x2 months), fever (subjective to 100.6 nightly) and nausea   Associated symptoms: no anorexia, no belching, no chest pain, no chills, no constipation, no dysuria, no flatus, no hematemesis, no hematochezia, no hematuria, no melena, no shortness of breath, no vaginal bleeding, no vaginal discharge and no vomiting   Risk factors: obesity   Risk factors: no alcohol abuse, has not had multiple surgeries and not pregnant     Past Medical History  Diagnosis Date  . Diabetes mellitus   . GERD (gastroesophageal reflux disease)   . Cellulitis 07/2011   Past Surgical History  Procedure Laterality Date  . Cholecystectomy    . Knee arthroscopy    . Laparoscopic tubal ligation  04/29/2011    Procedure: LAPAROSCOPIC TUBAL LIGATION;  Surgeon: Turner Daniels, MD;  Location: [facility];  Service: Gynecology;  Laterality: Bilateral;  with Filshie Clips  . Ablation     Family History  Problem Relation Age of Onset  . Cancer Mother   . Diabetes Mother   . Hypertension Mother    History  Substance Use Topics  . Smoking status: Former Smoker -- 0.50 packs/day for 2 years    Types: Cigarettes    Quit date: 07/22/2011  . Smokeless tobacco: Never Used  . Alcohol Use:  Yes     Comment: occasional   OB History   Grav Para Term Preterm Abortions TAB SAB Ect Mult Living                 Review of Systems  Constitutional: Positive for fever (subjective to 100.6 nightly). Negative for chills and diaphoresis.  Respiratory: Negative for  shortness of breath.   Cardiovascular: Negative for chest pain.  Gastrointestinal: Positive for nausea, abdominal pain and diarrhea (watery, 3x/day, intermittent, x2 months). Negative for vomiting, constipation, blood in stool, melena, hematochezia, abdominal distention, rectal pain, anorexia, flatus and hematemesis.  Genitourinary: Positive for urgency and frequency. Negative for dysuria, hematuria, flank pain, decreased urine volume, vaginal bleeding, vaginal discharge, difficulty urinating and vaginal pain.  Musculoskeletal: Negative for arthralgias, back pain and myalgias.  Skin: Negative for color change and rash.  Neurological: Negative for dizziness and weakness.  10 Systems reviewed and are negative for acute change except as noted in the HPI.     Allergies  Vancomycin and Adhesive  Home Medications   Prior to Admission medications   Medication Sig Start Date End Date Taking? Authorizing Provider  cephALEXin (KEFLEX) 500 MG capsule Take 1 capsule (500 mg total) by mouth 4 (four) times daily. 11/28/13   Geoffery Lyons, MD  cyclobenzaprine (FLEXERIL) 10 MG tablet Take 1 tablet (10 mg total) by mouth every 8 (eight) hours as needed for muscle spasms. 10/08/13   Lenda Kelp, MD  fluconazole (DIFLUCAN) 150 MG tablet Take 1 tablet (150 mg total) by mouth once. Take this pill in 1 week (01/07/14). 01/07/14   Moussa Wiegand Strupp Camprubi-Soms, PA-C  furosemide (LASIX) 20 MG tablet Take 1 tablet (20 mg total) by mouth daily. 05/14/13   Doris Cheadle, MD  glucose blood (BL TEST STRIP PACK) test strip Use as instructed 04/08/13   Nishant Dhungel, MD  HYDROcodone-acetaminophen (NORCO) 5-325 MG per tablet Take 1 tablet by mouth every 6 (six) hours as needed. 11/12/13   April K Palumbo-Rasch, MD  HYDROcodone-acetaminophen (NORCO) 5-325 MG per tablet Take 1-2 tablets by mouth every 6 (six) hours as needed. 11/28/13   Geoffery Lyons, MD  HYDROcodone-acetaminophen (NORCO) 5-325 MG per tablet Take 1-2 tablets  by mouth every 6 (six) hours as needed for severe pain. 12/31/13   Reshanda Lewey Strupp Camprubi-Soms, PA-C  insulin NPH-regular (NOVOLIN 70/30) (70-30) 100 UNIT/ML injection Inject 13 Units into the skin 2 (two) times daily with a meal. 04/08/13   Nishant Dhungel, MD  Lancets (ACCU-CHEK MULTICLIX) lancets Use as instructed 04/08/13   Nishant Dhungel, MD  metFORMIN (GLUCOPHAGE) 500 MG tablet Take 2 tablets (1,000 mg total) by mouth 2 (two) times daily with a meal. 04/08/13   Nishant Dhungel, MD  metroNIDAZOLE (FLAGYL) 500 MG tablet Take 1 tablet (500 mg total) by mouth 2 (two) times daily. One po bid x 7 days 12/31/13   Donnita Falls Camprubi-Soms, PA-C  naproxen (NAPROSYN) 500 MG tablet Take 1 tablet (500 mg total) by mouth 2 (two) times daily as needed for mild pain, moderate pain or headache (TAKE WITH MEALS.). 12/31/13   Dalila Arca Strupp Camprubi-Soms, PA-C  omeprazole (PRILOSEC OTC) 20 MG tablet Take 20 mg by mouth daily.    Historical Provider, MD  predniSONE (STERAPRED UNI-PAK) 10 MG tablet 6 tabs po day 1, 5 tabs po day 2, 4 tabs po day 3, 3 tabs po day 4, 2 tabs po day 5, 1 tab po day 6 10/08/13   Lenda Kelp, MD  promethazine (PHENERGAN) 12.5  MG suppository Place 1 suppository (12.5 mg total) rectally every 6 (six) hours as needed for nausea or vomiting. 11/12/13   April K Palumbo-Rasch, MD  sulfamethoxazole-trimethoprim (BACTRIM DS) 800-160 MG per tablet Take 1 tablet by mouth 2 (two) times daily. 12/31/13   Rakeem Colley Strupp Camprubi-Soms, PA-C  sulfamethoxazole-trimethoprim (SEPTRA DS) 800-160 MG per tablet Take 1 tablet by mouth every 12 (twelve) hours. 12/15/13   Elwin Mocha, MD   BP 141/90  Pulse 110  Temp(Src) 97.9 F (36.6 C) (Oral)  Resp 18  Ht  (1.549 m)  Wt 330 lb (149.687 kg)  BMI 62.39 kg/m2  SpO2 100% Physical Exam  Nursing note and vitals reviewed. Constitutional: She is oriented to person, place, and time. She appears well-developed. No distress.  Obese, tachycardic  initially but consistent with previous readings, afebrile, nontoxic, NAD  HENT:  Head: Normocephalic and atraumatic.  Mouth/Throat: Mucous membranes are normal.  Eyes: Conjunctivae and EOM are normal. Right eye exhibits no discharge. Left eye exhibits no discharge.  Neck: Normal range of motion. Neck supple.  Cardiovascular: Normal rate, regular rhythm, normal heart sounds and intact distal pulses.  Exam reveals no gallop and no friction rub.   No murmur heard. Pulmonary/Chest: Effort normal and breath sounds normal. No respiratory distress. She has no decreased breath sounds. She has no wheezes. She has no rhonchi. She has no rales.  Abdominal: Soft. Bowel sounds are normal. She exhibits no distension. There is tenderness in the right lower quadrant and suprapubic area. There is no rigidity, no rebound, no guarding, no CVA tenderness and no tenderness at McBurney's point.    Very obese abdomen which obscures exam. No obvious distension, soft, +BS throughout, with TTP in RLQ and suprapubic areas but neg mcburney's point TTP, no r/g/r, no CVA TTP  Genitourinary: Pelvic exam was performed with patient supine. There is no rash, tenderness or injury on the right labia. There is no rash, tenderness or injury on the left labia. Right adnexum displays no mass, no tenderness and no fullness. Left adnexum displays no mass, no tenderness and no fullness. There is erythema and tenderness around the vagina. No bleeding around the vagina. No foreign body around the vagina. Vaginal discharge found.  No rashes, lesions, or tenderness to external genitalia. + Mild erythema and tenderness to vaginal mucosa, no injuries noted. +thick white vaginal discharge within vaginal vault, no bleeding or FB present. No adnexal masses, tenderness, or fullness although body habitus limits full exam. No CMT although again body habitus limits exam. Unable to visualize cervix. Uterus unable to be palpated given body habitus.    Musculoskeletal: Normal range of motion.  Neurological: She is alert and oriented to person, place, and time.  Skin: Skin is warm, dry and intact. No rash noted. No erythema.  No erythema or swelling to skin over RLQ, no warmth noted  Psychiatric: She has a normal mood and affect.    ED Course  Procedures (including critical care time) Labs Review Labs Reviewed  WET PREP, GENITAL - Abnormal; Notable for the following:    Clue Cells Wet Prep HPF POC FEW (*)    WBC, Wet Prep HPF POC MANY (*)    All other components within normal limits  URINALYSIS, ROUTINE W REFLEX MICROSCOPIC - Abnormal; Notable for the following:    APPearance CLOUDY (*)    Specific Gravity, Urine 1.037 (*)    Glucose, UA >1000 (*)    Leukocytes, UA SMALL (*)    All other components  within normal limits  URINE MICROSCOPIC-ADD ON - Abnormal; Notable for the following:    Squamous Epithelial / LPF FEW (*)    Bacteria, UA FEW (*)    All other components within normal limits  CBC WITH DIFFERENTIAL - Abnormal; Notable for the following:    WBC 11.5 (*)    Neutro Abs 7.9 (*)    All other components within normal limits  COMPREHENSIVE METABOLIC PANEL - Abnormal; Notable for the following:    Glucose, Bld 348 (*)    Albumin 2.9 (*)    All other components within normal limits  GC/CHLAMYDIA PROBE AMP  URINE CULTURE  RPR  HIV ANTIBODY (ROUTINE TESTING)    Imaging Review No results found. CT abd/pelvis 11/11/13: negative   EKG Interpretation None      MDM   Final diagnoses:  RLQ abdominal pain  Nausea  Type 2 diabetes mellitus with hyperglycemia  UTI (lower urinary tract infection)  Yeast infection of the vagina  BV (bacterial vaginosis)    43y/o female with ongoing RLQ pain. Afebrile, abd exam limited due to body habitus, but given intermittent pain over months without clear infectious source, doubt appendicitis at this time. Doubt diverticulitis. U/A obtained in triage revealing glucosuria without  ketones, +leuks, and yeast with few bacteria and 3-6 WBC but few squamous cells. Given symptoms, will tx for UTI, but yeast infection likely the source. Last CT on 11/11/13 negative, doubt need for repeat CT. Will obtain basic labs and give morphine/zofran and reassess. Will obtain pelvic now as well as STD panel.  2:07 AM Pelvic exam reveals white thick discharge, unable to visualize cervix due to body habitus but doubt PID given lack of pain with digital exam. Wet prep pending. Morphine helped with pain. CBC w/diff relatively unremarkable. CMP showing gluc 348 and no anion gap, doubt DKA/HHS. Will await wet prep and likely tx for UTI and yeast infection.  2:37 AM Wet prep revealing no yeast, few clue cells, many WBC, will tx for BV. Will still tx for yeast infection given that it appeared in urine. Will empirically cover for STDs here given exam was difficult to obtain d/t body habitus, and ensure coverage for any possible vaginal sources of infection. Discussed that lab will call pt if results are positive for STDs. Doubt PID. Will give pain control and have pt f/up with her PCP, discussed importance of DM2 management and sugar control as well as wt loss. I explained the diagnosis and have given explicit precautions to return to the ER including for any other new or worsening symptoms. The patient understands and accepts the medical plan as it's been dictated and I have answered their questions. Discharge instructions concerning home care and prescriptions have been given. The patient is STABLE and is discharged to home in good condition.  BP 122/66  Pulse 109  Temp(Src) 98 F (36.7 C) (Oral)  Resp 21  Ht  (1.549 m)  Wt 330 lb (149.687 kg)  BMI 62.39 kg/m2  SpO2 96%  Meds ordered this encounter  Medications  . morphine 4 MG/ML injection 4 mg    Sig:   . ondansetron (ZOFRAN) injection 4 mg    Sig:   . fluconazole (DIFLUCAN) tablet 150 mg    Sig:   . azithromycin (ZITHROMAX) tablet  1,000 mg    Sig:   . cefTRIAXone (ROCEPHIN) injection 250 mg    Sig:     Order Specific Question:  Antibiotic Indication:    Answer:  STD    Order Specific Question:  Other Indication:    Answer:  and possible UTI  . fluconazole (DIFLUCAN) 150 MG tablet    Sig: Take 1 tablet (150 mg total) by mouth once. Take this pill in 1 week (01/07/14).    Dispense:  1 tablet    Refill:  0    Order Specific Question:  Supervising Provider    Answer:  Eber Hong D [[id number]]  . HYDROcodone-acetaminophen (NORCO) 5-325 MG per tablet    Sig: Take 1-2 tablets by mouth every 6 (six) hours as needed for severe pain.    Dispense:  6 tablet    Refill:  0    Order Specific Question:  Supervising Provider    Answer:  Eber Hong D [[id number]]  . sulfamethoxazole-trimethoprim (BACTRIM DS) 800-160 MG per tablet    Sig: Take 1 tablet by mouth 2 (two) times daily.    Dispense:  14 tablet    Refill:  0    Order Specific Question:  Supervising Provider    Answer:  Eber Hong D [[id number]]  . naproxen (NAPROSYN) 500 MG tablet    Sig: Take 1 tablet (500 mg total) by mouth 2 (two) times daily as needed for mild pain, moderate pain or headache (TAKE WITH MEALS.).    Dispense:  20 tablet    Refill:  0    Order Specific Question:  Supervising Provider    Answer:  Eber Hong D [[id number]]  . metroNIDAZOLE (FLAGYL) 500 MG tablet    Sig: Take 1 tablet (500 mg total) by mouth 2 (two) times daily. One po bid x 7 days    Dispense:  14 tablet    Refill:  0    Order Specific Question:  Supervising Provider    Answer:  Eber Hong D [address] Camprubi-Soms, PA-C 12/31/13 0246

## 2014-01-01 LAB — GC/CHLAMYDIA PROBE AMP
CT Probe RNA: NEGATIVE
GC Probe RNA: NEGATIVE

## 2014-01-02 LAB — URINE CULTURE

## 2014-01-03 ENCOUNTER — Telehealth (HOSPITAL_BASED_OUTPATIENT_CLINIC_OR_DEPARTMENT_OTHER): Payer: Self-pay | Admitting: Emergency Medicine

## 2014-01-03 NOTE — Telephone Encounter (Signed)
Post ED Visit - Positive Culture Follow-up  Culture report reviewed by antimicrobial stewardship pharmacist:  Wes Dulaney, Pharm.D., BCPS  Celedonio Miyamoto, [address].D., BCPS  Georgina Pillion, Pharm.D., BCPS  [place], [place].D., BCPS, AAHIVP  Estella Husk, Pharm.D., BCPS, AAHIVP  Carly Sabat, Pharm.D.  Enzo Bi, Pharm.D.  Positive urine culture 20,000 colonies Klebsiella Treated with fluconazole, , organism sensitive to the same and no further patient follow-up is required at this time.  Berle Mull 01/03/2014, 12:24 PM

## 2014-01-11 ENCOUNTER — Emergency Department (HOSPITAL_BASED_OUTPATIENT_CLINIC_OR_DEPARTMENT_OTHER): Payer: No Typology Code available for payment source

## 2014-01-11 ENCOUNTER — Emergency Department (HOSPITAL_BASED_OUTPATIENT_CLINIC_OR_DEPARTMENT_OTHER)
Admission: EM | Admit: 2014-01-11 | Discharge: 2014-01-11 | Disposition: A | Discharge status: Home / Self Care | Payer: No Typology Code available for payment source | Attending: Emergency Medicine | Admitting: Emergency Medicine

## 2014-01-11 ENCOUNTER — Encounter (HOSPITAL_BASED_OUTPATIENT_CLINIC_OR_DEPARTMENT_OTHER): Payer: Self-pay | Admitting: Emergency Medicine

## 2014-01-11 DIAGNOSIS — S91109A Unspecified open wound of unspecified toe(s) without damage to nail, initial encounter: Secondary | ICD-10-CM | POA: Insufficient documentation

## 2014-01-11 DIAGNOSIS — S91209A Unspecified open wound of unspecified toe(s) with damage to nail, initial encounter: Secondary | ICD-10-CM

## 2014-01-11 DIAGNOSIS — S99919A Unspecified injury of unspecified ankle, initial encounter: Secondary | ICD-10-CM

## 2014-01-11 DIAGNOSIS — Y9389 Activity, other specified: Secondary | ICD-10-CM | POA: Insufficient documentation

## 2014-01-11 DIAGNOSIS — S99929A Unspecified injury of unspecified foot, initial encounter: Secondary | ICD-10-CM

## 2014-01-11 DIAGNOSIS — W230XXA Caught, crushed, jammed, or pinched between moving objects, initial encounter: Secondary | ICD-10-CM | POA: Insufficient documentation

## 2014-01-11 DIAGNOSIS — Z872 Personal history of diseases of the skin and subcutaneous tissue: Secondary | ICD-10-CM | POA: Insufficient documentation

## 2014-01-11 DIAGNOSIS — Y929 Unspecified place or not applicable: Secondary | ICD-10-CM | POA: Insufficient documentation

## 2014-01-11 DIAGNOSIS — K219 Gastro-esophageal reflux disease without esophagitis: Secondary | ICD-10-CM | POA: Insufficient documentation

## 2014-01-11 DIAGNOSIS — Z792 Long term (current) use of antibiotics: Secondary | ICD-10-CM | POA: Insufficient documentation

## 2014-01-11 DIAGNOSIS — Z794 Long term (current) use of insulin: Secondary | ICD-10-CM | POA: Insufficient documentation

## 2014-01-11 DIAGNOSIS — S8990XA Unspecified injury of unspecified lower leg, initial encounter: Secondary | ICD-10-CM | POA: Insufficient documentation

## 2014-01-11 DIAGNOSIS — Z87891 Personal history of nicotine dependence: Secondary | ICD-10-CM | POA: Insufficient documentation

## 2014-01-11 DIAGNOSIS — E119 Type 2 diabetes mellitus without complications: Secondary | ICD-10-CM | POA: Insufficient documentation

## 2014-01-11 MED ORDER — SULFAMETHOXAZOLE-TMP DS 800-160 MG PO TABS
1.0000 | ORAL_TABLET | Freq: Once | ORAL | Status: AC
  Administered 2014-01-11: 1 via ORAL
  Filled 2014-01-11: qty 1

## 2014-01-11 MED ORDER — SULFAMETHOXAZOLE-TRIMETHOPRIM 800-160 MG PO TABS: 1.0000 | ORAL_TABLET | Freq: Two times a day (BID) | ORAL | Status: DC

## 2014-01-11 MED ORDER — HYDROCODONE-ACETAMINOPHEN 5-325 MG PO TABS
1.0000 | ORAL_TABLET | Freq: Once | ORAL | Status: AC
  Administered 2014-01-11: 1 via ORAL
  Filled 2014-01-11: qty 1

## 2014-01-11 MED ORDER — BACITRACIN ZINC 500 UNIT/GM EX OINT: 1.0000 "application " | TOPICAL_OINTMENT | Freq: Two times a day (BID) | CUTANEOUS | Status: DC

## 2014-01-11 MED ORDER — CEPHALEXIN 500 MG PO CAPS: 1000.0000 mg | ORAL_CAPSULE | Freq: Two times a day (BID) | ORAL | Status: DC

## 2014-01-11 MED ORDER — CEPHALEXIN 250 MG PO CAPS
1000.0000 mg | ORAL_CAPSULE | Freq: Once | ORAL | Status: AC
  Administered 2014-01-11: 1000 mg via ORAL
  Filled 2014-01-11: qty 4

## 2014-01-11 MED ORDER — HYDROCODONE-ACETAMINOPHEN 5-325 MG PO TABS: 1.0000 | ORAL_TABLET | ORAL | Status: DC | PRN

## 2014-01-11 NOTE — Discharge Instructions (Signed)
Nail Bed Injury °The nail bed is the soft tissue under a fingernail or toenail that is the origin for new nail growth. Various types of injuries can occur at the nail bed. These injuries may involve bruising or bleeding under the nail, cuts (lacerations) in the nail or nail bed, or loss of a part of the nail or the whole nail (avulsion). In some cases, a nail bed injury accompanies another injury, such as a break (fracture) of the bone at the tip of the finger or toe. Nail bed injuries are common in people who have jobs that require performing manual tasks with their hands, such as carpenters and landscapers.  °The nail bed includes the growth center of the nail. If this growth center is damaged, the injured nail may not grow back normally if at all. The regrown nail might have an abnormal shape or appearance. It can take several months for a damaged or torn-off nail to regrow. Depending on the nature and extent of the nail bed injury, there may be a permanent disruption of normal nail growth. °CAUSES  °Damage to the nail bed area is usually caused by crushing, pinching, cutting, or tearing injuries of the fingertip or toe. For example, these injuries may occur when a fingertip gets caught in a door, hit by a hammer, or damaged in accidents involving electrical tools or power machinery.  °SYMPTOMS  °Symptoms vary depending on the nature of the injury. Symptoms may include: °· Pain in the injured area. °· Bleeding. °· Swelling. °· Discoloration. °· Collection of blood under the nail (hematoma). °· Deformed or split nail. °· Loose nail (not stuck to the nail bed). °· Loss of all or part of the nail. °DIAGNOSIS  °Your caregiver will take a medical history and examine the injured area. You will be asked to describe how the injury occurred. X-rays may be done to see if you have a fracture. Your caregiver might also check for conditions that may affect healing, such as diabetes, nerve problems, or poor circulation.    °TREATMENT  °Treatment depends on the type of injury. °· The injury may not require any special treatment other than keeping the area clean and free of infection.   °· Your caregiver may drain the collection of blood from under the nail. This can be done by making a small hole in the nail.   °· Your caregiver may remove all or part of your nail. This might be necessary to stitch (suture) any laceration in the nail bed. Before doing this, the caregiver will likely give you medication to numb the nail area (local anesthetic). In some cases, the caregiver may choose to numb the entire finger or toe (digital nerve block). Depending on the location and size of the nail bed injury, an avulsed nail is sometimes stitched back in place to provide temporary protection to the nail bed until the new nail grows in. °· Your caregiver may apply bandages (dressings) or splints to the area. °· You might be prescribed antibiotic medication to help prevent infection. °· For certain injuries, your caregiver may direct you to see a hand or foot specialist.   °You may need a tetanus shot if: °· You cannot remember when you had your last tetanus shot. °· You have never had a tetanus shot. °· The injury broke your skin. °If you get a tetanus shot, your arm may swell, get red, and feel warm to the touch. This is common and not a problem. If you need a tetanus   shot and you choose not to have one, there is a rare chance of getting tetanus. Sickness from tetanus can be serious. °HOME CARE INSTRUCTIONS  °· Keep your hand or foot raised (elevated) to relieve pain and swelling.   °¨ For an injured toenail, lie in bed or on a couch with your leg on pillows. You can also sit in a recliner with your leg up. Avoid walking or letting your leg dangle. When you walk, wear an open-toe shoe.  °¨ For an injured fingernail, keep your hand above the level of your heart. Use pillows on a table or on the arm of your chair while sitting. Use them on your bed  while sleeping.   °· Keep your injury protected with dressings or splints as directed by your caregiver.   °· Keep any dressings clean and dry. Change or remove your dressings as directed by your caregiver.   °· Only take over-the-counter or prescription medications as directed by your caregiver. If you were prescribed antibiotics, take them as directed. Finish them even if you start to feel better.   °· Follow up with your caregiver as directed.   °SEEK MEDICAL CARE IF:  °· You have pain that is not controlled with medication.   °· You have any problems caring for your injury.   °SEEK IMMEDIATE MEDICAL CARE IF:  °· You have increased pain, drainage, or bleeding in the injured area.   °· You have redness, soreness, and swelling (inflammation) in the injured area. °· You have a fever or persistent symptoms for more than 2-3 days. °· You have a fever and your symptoms suddenly get worse. °· You have swelling that spreads from your finger into your hand or from your toe into your foot.   °MAKE SURE YOU: °· Understand these instructions. °· Will watch your condition. °· Will get help right away if you are not doing well or get worse. °Document Released: 05/12/2004 Document Revised: 07/30/2012 Document Reviewed: 04/26/2012 °[employer]® Patient Information ©2015 [employer]. This information is not intended to replace advice given to you by your health care provider. Make sure you discuss any questions you have with your health care provider. ° °

## 2014-01-11 NOTE — ED Provider Notes (Signed)
CSN: [id number]     Arrival date & time 01/11/14  1814 History   First MD Initiated Contact with Patient 01/11/14 2130     Chief Complaint  Patient presents with  . Toe Injury     (Consider location/radiation/quality/duration/timing/severity/associated sxs/prior Treatment) HPI The patient reports that she caught her toenail on her sheets this morning getting out of bed. She reports it was bleeding. She put a dressing on it and went to work today. She reports however it has gotten very painful over the course of the day. It is throbbing, has developed some redness, and continued to have some bleeding. Patient has not developed a fever generalized malaise. It is exacerbated by any pressure. However she is able to walk. The patient is diabetic. She reports some time ago she did have cellulitis in her right leg. She reports fortunately she has not had that for some time. She denies that she had any pre-existing redness or swelling in the leg.  Past Medical History  Diagnosis Date  . Diabetes mellitus   . GERD (gastroesophageal reflux disease)   . Cellulitis 07/2011   Past Surgical History  Procedure Laterality Date  . Cholecystectomy    . Knee arthroscopy    . Laparoscopic tubal ligation  04/29/2011    Procedure: LAPAROSCOPIC TUBAL LIGATION;  Surgeon: Turner Daniels, MD;  Location: [facility];  Service: Gynecology;  Laterality: Bilateral;  with Filshie Clips  . Ablation     Family History  Problem Relation Age of Onset  . Cancer Mother   . Diabetes Mother   . Hypertension Mother    History  Substance Use Topics  . Smoking status: Former Smoker -- 0.50 packs/day for 2 years    Types: Cigarettes    Quit date: 07/22/2011  . Smokeless tobacco: Never Used  . Alcohol Use: Yes     Comment: occasional   OB History   Grav Para Term Preterm Abortions TAB SAB Ect Mult Living                 Review of Systems  Constitutional: no fevers or chills or general malaise.   Allergies   Vancomycin and Adhesive  Home Medications   Prior to Admission medications   Medication Sig Start Date End Date Taking? Authorizing Provider  cephALEXin (KEFLEX) 500 MG capsule Take 2 capsules (1,000 mg total) by mouth 2 (two) times daily. 01/11/14   Arby Barrette, MD  cyclobenzaprine (FLEXERIL) 10 MG tablet Take 1 tablet (10 mg total) by mouth every 8 (eight) hours as needed for muscle spasms. 10/08/13   Lenda Kelp, MD  furosemide (LASIX) 20 MG tablet Take 1 tablet (20 mg total) by mouth daily. 05/14/13   Doris Cheadle, MD  glucose blood (BL TEST STRIP PACK) test strip Use as instructed 04/08/13   Nishant Dhungel, MD  HYDROcodone-acetaminophen (NORCO/VICODIN) 5-325 MG per tablet Take 1 tablet by mouth every 4 (four) hours as needed. 01/11/14   Arby Barrette, MD  insulin NPH-regular (NOVOLIN 70/30) (70-30) 100 UNIT/ML injection Inject 13 Units into the skin 2 (two) times daily with a meal. 04/08/13   Nishant Dhungel, MD  Lancets (ACCU-CHEK MULTICLIX) lancets Use as instructed 04/08/13   Nishant Dhungel, MD  metFORMIN (GLUCOPHAGE) 500 MG tablet Take 2 tablets (1,000 mg total) by mouth 2 (two) times daily with a meal. 04/08/13   Nishant Dhungel, MD  omeprazole (PRILOSEC OTC) 20 MG tablet Take 20 mg by mouth daily.    Historical Provider, MD  sulfamethoxazole-trimethoprim (SEPTRA DS) 800-160 MG per tablet Take 1 tablet by mouth every 12 (twelve) hours. 01/11/14   Arby Barrette, MD   BP 108/85  Pulse 115  Temp(Src) 98.1 F (36.7 C) (Oral)  Resp 18  Ht  (1.549 m)  Wt 330 lb (149.687 kg)  BMI 62.39 kg/m2  SpO2 100% Physical Exam Constitutional: This is an obese but well appearing 43 year old female. She is nontoxic and alert. Respiratory: Respirations are nonlabored Neurologic: Patient is alert and oriented. Speech is normal and clear. Movements are coordinated and purposeful. Skin: Warm and dry color good. Extremities: The right lower extremity shows the fifth digit to have a  thickened toenail. There is a small amount of blood at the medial nail bed margin. The nail however is still strongly adherent to the nailbed. I can only elevate the medial edge a small amount. There is no active bleeding but there is some dried blood around the edge of the nail fold. The eponychial fold is intact without any evidence of subungual hematoma. The digit appears mildly erythematous. It is tender to palpation. There is no erythema on the foot. Of note, patient does have generally thickened and the elongated toenails with rough edges. ED Course  Procedures (including critical care time) Labs Review Labs Reviewed - No data to display  Imaging Review Dg Toe 5th Right  01/11/2014   CLINICAL DATA:  Right fifth toe pain after injury.  EXAM: RIGHT FIFTH TOE  COMPARISON:  None.  FINDINGS: There is no evidence of fracture or dislocation. There is no evidence of arthropathy or other focal bone abnormality. Soft tissues are unremarkable.  IMPRESSION: Normal right fifth toe.   Electronically Signed   By: Roque Lias M.D.   On: 01/11/2014 19:30     EKG Interpretation None      MDM   Final diagnoses:  Nail avulsion, toe, initial encounter  Type 2 diabetes mellitus without complication   The patient presented to the toenail that is only very minimally disrupted from the nail bed. She is however diabetic and has developed increasing pain in the toe over the course of the day. She also has had cellulitis in the past. I will empirically put her on antibiotics at point in time. As the toenail is strongly adherent to the bed, I did not feel that removing the toenail was appropriate. I have counseled the patient however that she needs to see a podiatrist for this injury and management of the feet in general as she has thickened onychomycotic nails and risk of infection due to diabetes.    Arby Barrette, MD 01/11/14 2206

## 2014-01-11 NOTE — ED Notes (Signed)
Pt reports that her toenail got caught on the bed when she was getting out of bed.  Noted to have dried blood around the toe nail.  Ambulatory.

## 2014-01-26 ENCOUNTER — Emergency Department (HOSPITAL_COMMUNITY)
Admission: EM | Admit: 2014-01-26 | Discharge: 2014-01-27 | Disposition: A | Discharge status: Home / Self Care | Payer: No Typology Code available for payment source | Attending: Emergency Medicine | Admitting: Emergency Medicine

## 2014-01-26 DIAGNOSIS — Z872 Personal history of diseases of the skin and subcutaneous tissue: Secondary | ICD-10-CM | POA: Insufficient documentation

## 2014-01-26 DIAGNOSIS — M25512 Pain in left shoulder: Secondary | ICD-10-CM

## 2014-01-26 DIAGNOSIS — W541XXA Struck by dog, initial encounter: Secondary | ICD-10-CM | POA: Insufficient documentation

## 2014-01-26 DIAGNOSIS — G8929 Other chronic pain: Secondary | ICD-10-CM | POA: Insufficient documentation

## 2014-01-26 DIAGNOSIS — Z87891 Personal history of nicotine dependence: Secondary | ICD-10-CM | POA: Insufficient documentation

## 2014-01-26 DIAGNOSIS — K219 Gastro-esophageal reflux disease without esophagitis: Secondary | ICD-10-CM | POA: Insufficient documentation

## 2014-01-26 DIAGNOSIS — Y9389 Activity, other specified: Secondary | ICD-10-CM | POA: Insufficient documentation

## 2014-01-26 DIAGNOSIS — E119 Type 2 diabetes mellitus without complications: Secondary | ICD-10-CM | POA: Insufficient documentation

## 2014-01-26 DIAGNOSIS — Z79899 Other long term (current) drug therapy: Secondary | ICD-10-CM | POA: Insufficient documentation

## 2014-01-26 DIAGNOSIS — S46012A Strain of muscle(s) and tendon(s) of the rotator cuff of left shoulder, initial encounter: Secondary | ICD-10-CM | POA: Insufficient documentation

## 2014-01-26 DIAGNOSIS — Z794 Long term (current) use of insulin: Secondary | ICD-10-CM | POA: Insufficient documentation

## 2014-01-26 DIAGNOSIS — Y9289 Other specified places as the place of occurrence of the external cause: Secondary | ICD-10-CM | POA: Insufficient documentation

## 2014-01-27 ENCOUNTER — Emergency Department (HOSPITAL_COMMUNITY): Payer: No Typology Code available for payment source

## 2014-01-27 ENCOUNTER — Encounter (HOSPITAL_COMMUNITY): Payer: Self-pay | Admitting: Emergency Medicine

## 2014-01-27 MED ORDER — TRAMADOL HCL 50 MG PO TABS: 50.0000 mg | ORAL_TABLET | Freq: Four times a day (QID) | ORAL | Status: DC | PRN

## 2014-01-27 MED ORDER — DIAZEPAM 5 MG PO TABS
5.0000 mg | ORAL_TABLET | Freq: Once | ORAL | Status: AC
  Administered 2014-01-27: 5 mg via ORAL
  Filled 2014-01-27: qty 1

## 2014-01-27 MED ORDER — OXYCODONE-ACETAMINOPHEN 5-325 MG PO TABS
2.0000 | ORAL_TABLET | Freq: Once | ORAL | Status: AC
  Administered 2014-01-27: 2 via ORAL
  Filled 2014-01-27: qty 2

## 2014-01-27 MED ORDER — NAPROXEN 500 MG PO TABS: 500.0000 mg | ORAL_TABLET | Freq: Two times a day (BID) | ORAL | Status: DC

## 2014-01-27 MED ORDER — DIAZEPAM 5 MG PO TABS: 5.0000 mg | ORAL_TABLET | Freq: Two times a day (BID) | ORAL | Status: DC

## 2014-01-27 NOTE — ED Notes (Signed)
Pt states that she has had ;left shoulder pain for awhile and last night / early am her dog jumped up in the bed and pulled on her arm; pt c/o left shoulder upper arm pain

## 2014-01-27 NOTE — Discharge Instructions (Signed)
Rotator Cuff Injury Rotator cuff injury is any type of injury to the set of muscles and tendons that make up the stabilizing unit of your shoulder. This unit holds the ball of your upper arm bone (humerus) in the socket of your shoulder blade (scapula).  CAUSES Injuries to your rotator cuff most commonly come from sports or activities that cause your arm to be moved repeatedly over your head. Examples of this include throwing, weight lifting, swimming, or racquet sports. Long lasting (chronic) irritation of your rotator cuff can cause soreness and swelling (inflammation), bursitis, and eventual damage to your tendons, such as a tear (rupture). SIGNS AND SYMPTOMS Acute rotator cuff tear:  Sudden tearing sensation followed by severe pain shooting from your upper shoulder down your arm toward your elbow.  Decreased range of motion of your shoulder because of pain and muscle spasm.  Severe pain.  Inability to raise your arm out to the side because of pain and loss of muscle power (large tears). Chronic rotator cuff tear:  Pain that usually is worse at night and may interfere with sleep.  Gradual weakness and decreased shoulder motion as the pain worsens.  Decreased range of motion. Rotator cuff tendinitis:  Deep ache in your shoulder and the outside upper arm over your shoulder.  Pain that comes on gradually and becomes worse when lifting your arm to the side or turning it inward. DIAGNOSIS Rotator cuff injury is diagnosed through a medical history, physical exam, and imaging exam. The medical history helps determine the type of rotator cuff injury. Your health care provider will look at your injured shoulder, feel the injured area, and ask you to move your shoulder in different positions. X-ray exams typically are done to rule out other causes of shoulder pain, such as fractures. MRI is the exam of choice for the most severe shoulder injuries because the images show muscles and tendons.    TREATMENT  Chronic tear:  Medicine for pain, such as acetaminophen or ibuprofen.  Physical therapy and range-of-motion exercises may be helpful in maintaining shoulder function and strength.  Steroid injections into your shoulder joint.  Surgical repair of the rotator cuff if the injury does not heal with noninvasive treatment. Acute tear:  Anti-inflammatory medicines such as ibuprofen and naproxen to help reduce pain and swelling.  A sling to help support your arm and rest your rotator cuff muscles. Long-term use of a sling is not advised. It may cause significant stiffening of the shoulder joint.  Surgery may be considered within a few weeks, especially in younger, active people, to return the shoulder to full function.  Indications for surgical treatment include the following:  Age younger than 60 years.  Rotator cuff tears that are complete.  Physical therapy, rest, and anti-inflammatory medicines have been used for 6-8 weeks, with no improvement.  Employment or sporting activity that requires constant shoulder use. Tendinitis:  Anti-inflammatory medicines such as ibuprofen and naproxen to help reduce pain and swelling.  A sling to help support your arm and rest your rotator cuff muscles. Long-term use of a sling is not advised. It may cause significant stiffening of the shoulder joint.  Severe tendinitis may require:  Steroid injections into your shoulder joint.  Physical therapy.  Surgery. HOME CARE INSTRUCTIONS   Apply ice to your injury:  Put ice in a plastic bag.  Place a towel between your skin and the bag.  Leave the ice on for 20 minutes, 2-3 times a day.  If you   have a shoulder immobilizer (sling and straps), wear it until told otherwise by your health care provider.  You may want to sleep on several pillows or in a recliner at night to lessen swelling and pain.  Only take over-the-counter or prescription medicines for pain, discomfort, or fever as  directed by your health care provider.  Do simple hand squeezing exercises with a soft rubber ball to decrease hand swelling. SEEK MEDICAL CARE IF:   Your shoulder pain increases, or new pain or numbness develops in your arm, hand, or fingers.  Your hand or fingers are colder than your other hand. SEEK IMMEDIATE MEDICAL CARE IF:   Your arm, hand, or fingers are numb or tingling.  Your arm, hand, or fingers are increasingly swollen and painful, or they turn white or blue. MAKE SURE YOU:  Understand these instructions.  Will watch your condition.  Will get help right away if you are not doing well or get worse. Document Released: 04/01/2000 Document Revised: 04/09/2013 Document Reviewed: 11/14/2012 [employer] Patient Information 2015 [employer]. This information is not intended to replace advice given to you by your health care provider. Make sure you discuss any questions you have with your health care provider.  

## 2014-01-27 NOTE — ED Provider Notes (Signed)
Medical screening examination/treatment/procedure(s) were performed by non-physician practitioner and as supervising physician I was immediately available for consultation/collaboration.   Anhthu Perdew T Yona Stansbury, MD 01/27/14 0333 

## 2014-01-27 NOTE — ED Provider Notes (Signed)
CSN: [id number]     Arrival date & time 01/26/14  2333 History   First MD Initiated Contact with Patient 01/27/14 0038     Chief Complaint  Patient presents with  . Shoulder Pain    (Consider location/radiation/quality/duration/timing/severity/associated sxs/prior Treatment) HPI Comments: 43 year old female with a history of left shoulder pain as well as diabetes and esophageal reflux presents to the emergency department for left shoulder pain. Patient states that her boxer jumped on her left arm and pulled down with his body weight causing patient to stretch her left shoulder. Patient states that she has been having pain in her shoulder since this time which has not been relieved with ibuprofen. Pain is worse with abduction. She denies associated extremity weakness, fever, paresthesias, or red linear streaking/erythema of her L shoulder.  Patient is a 43 y.o. female presenting with shoulder injury.  Shoulder Injury This is a recurrent problem. The current episode started yesterday. The problem occurs constantly. The problem has been gradually worsening. Associated symptoms include arthralgias and myalgias. Pertinent negatives include no fever, joint swelling, numbness or weakness. Exacerbated by: movement and palpation to L shoulder. She has tried acetaminophen for the symptoms. The treatment provided mild relief.    Past Medical History  Diagnosis Date  . Diabetes mellitus   . GERD (gastroesophageal reflux disease)   . Cellulitis 07/2011   Past Surgical History  Procedure Laterality Date  . Cholecystectomy    . Knee arthroscopy    . Laparoscopic tubal ligation  04/29/2011    Procedure: LAPAROSCOPIC TUBAL LIGATION;  Surgeon: Turner Daniels, MD;  Location: [facility];  Service: Gynecology;  Laterality: Bilateral;  with Filshie Clips  . Ablation     Family History  Problem Relation Age of Onset  . Cancer Mother   . Diabetes Mother   . Hypertension Mother    History  Substance Use Topics   . Smoking status: Former Smoker -- 0.50 packs/day for 2 years    Types: Cigarettes    Quit date: 07/22/2011  . Smokeless tobacco: Never Used  . Alcohol Use: Yes     Comment: occasional   OB History   Grav Para Term Preterm Abortions TAB SAB Ect Mult Living                  Review of Systems  Constitutional: Negative for fever.  Musculoskeletal: Positive for arthralgias and myalgias. Negative for joint swelling.  Skin: Negative for color change.  Neurological: Negative for weakness and numbness.  All other systems reviewed and are negative.   Allergies  Vancomycin and Adhesive  Home Medications   Prior to Admission medications   Medication Sig Start Date End Date Taking? Authorizing Provider  furosemide (LASIX) 20 MG tablet Take 1 tablet (20 mg total) by mouth daily. 05/14/13  Yes Doris Cheadle, MD  insulin NPH-regular (NOVOLIN 70/30) (70-30) 100 UNIT/ML injection Inject 13 Units into the skin 2 (two) times daily with a meal. 04/08/13  Yes Nishant Dhungel, MD  metFORMIN (GLUCOPHAGE) 500 MG tablet Take 2 tablets (1,000 mg total) by mouth 2 (two) times daily with a meal. 04/08/13  Yes Nishant Dhungel, MD  omeprazole (PRILOSEC OTC) 20 MG tablet Take 20 mg by mouth daily.   Yes Historical Provider, MD  diazepam (VALIUM) 5 MG tablet Take 1 tablet (5 mg total) by mouth 2 (two) times daily. 01/27/14   Antony Madura, PA-C  naproxen (NAPROSYN) 500 MG tablet Take 1 tablet (500 mg total) by mouth 2 (two) times  daily. 01/27/14   Antony MaduraKelly Simmone Cape, PA-C  traMADol (ULTRAM) 50 MG tablet Take 1 tablet (50 mg total) by mouth every 6 (six) hours as needed. 01/27/14   Antony MaduraKelly Dimitra Woodstock, PA-C   BP 116/65  Pulse 76  Temp(Src) 98.2 F (36.8 C) (Oral)  Resp 20  Wt 330 lb (149.687 kg)  SpO2 95%  Physical Exam  Nursing note and vitals reviewed. Constitutional: She is oriented to person, place, and time. She appears well-developed and well-nourished. No distress.  Nontoxic/nonseptic appearing  HENT:  Head:  Normocephalic and atraumatic.  Eyes: Conjunctivae and EOM are normal. No scleral icterus.  Neck: Normal range of motion.  Cardiovascular: Normal rate, regular rhythm and intact distal pulses.   Distal radial pulse 2+ in LUE  Pulmonary/Chest: Effort normal. No respiratory distress.  Chest expansion symmetric  Musculoskeletal: Normal range of motion. She exhibits tenderness.       Left shoulder: She exhibits tenderness and pain. She exhibits normal range of motion, no bony tenderness, no swelling, no effusion, no crepitus, no deformity, normal pulse and normal strength.       Left upper arm: Normal.  Neurological: She is alert and oriented to person, place, and time. She exhibits normal muscle tone. Coordination normal.  Sensation to light touch intact. 5/5 strength against resistance of left shoulder with abduction and abduction. Normal grip strength in left upper extremity.  Skin: Skin is warm and dry. No rash noted. She is not diaphoretic. No erythema. No pallor.  Psychiatric: She has a normal mood and affect. Her behavior is normal.    ED Course  Procedures (including critical care time) Labs Review Labs Reviewed - No data to display  Imaging Review Dg Shoulder Left  01/27/2014   CLINICAL DATA:  Patient fell down steps 2 weeks ago. Now with recurrent injury to the left shoulder. Initial encounter.  EXAM: LEFT SHOULDER - 2+ VIEW  COMPARISON:  11/28/2013  FINDINGS: No fracture or dislocation. Glenohumeral joint spaces appear preserved. Mild left AC joint degenerative change with joint space loss, subchondral sclerosis and inferiorly directed osteophytosis. No evidence of calcific tendinitis. Regional soft tissues appear normal. Limited visualization adjacent thorax is normal.  IMPRESSION: 1. No acute findings. 2. Mild AC joint degenerative change.   Electronically Signed   By: Simonne ComeJohn  Watts M.D.   On: 01/27/2014 01:03     EKG Interpretation None      MDM   Final diagnoses:  Rotator  cuff strain, left, initial encounter  Left shoulder pain    43 year old female presents to the emergency department for acute on chronic left shoulder pain. Symptoms worsened after patient's dog jumped on her left arm causing a strain to her left shoulder. Patient neurovascularly intact. No sensory deficits appreciated. No crepitus or deformity. Imaging significant only for mild degenerative changes; no acute findings. Have discussed management with anti-inflammatories and muscle relaxers as well as alternating of ice and heat packs. Have also discussed with patient that her shoulder will not be immobilized secondary to concern for development of frozen shoulder. Patient given Percocet and Valium in ED for symptoms. She is stable for discharge with instruction to follow up with orthopedics. Return precautions provided and patient agreeable to plan with no unaddressed concerns.   Filed Vitals:   01/26/14 2348 01/27/14 0142  BP: 144/97 116/65  Pulse: 106 76  Temp: 98.2 F (36.8 C)   TempSrc: Oral   Resp: 20   Weight: 330 lb (149.687 kg)   SpO2: 97% 95%  Antony MaduraKelly Anaiya Wisinski, PA-C 01/27/14 [phone]

## 2014-01-29 ENCOUNTER — Emergency Department (HOSPITAL_COMMUNITY): Payer: Self-pay

## 2014-01-29 ENCOUNTER — Emergency Department (HOSPITAL_COMMUNITY)
Admission: EM | Admit: 2014-01-29 | Discharge: 2014-01-29 | Disposition: A | Discharge status: Home / Self Care | Payer: Self-pay | Attending: Emergency Medicine | Admitting: Emergency Medicine

## 2014-01-29 ENCOUNTER — Encounter (HOSPITAL_COMMUNITY): Payer: Self-pay | Admitting: Emergency Medicine

## 2014-01-29 DIAGNOSIS — Z79899 Other long term (current) drug therapy: Secondary | ICD-10-CM | POA: Insufficient documentation

## 2014-01-29 DIAGNOSIS — Z872 Personal history of diseases of the skin and subcutaneous tissue: Secondary | ICD-10-CM | POA: Insufficient documentation

## 2014-01-29 DIAGNOSIS — E119 Type 2 diabetes mellitus without complications: Secondary | ICD-10-CM | POA: Insufficient documentation

## 2014-01-29 DIAGNOSIS — Z87891 Personal history of nicotine dependence: Secondary | ICD-10-CM | POA: Insufficient documentation

## 2014-01-29 DIAGNOSIS — K219 Gastro-esophageal reflux disease without esophagitis: Secondary | ICD-10-CM | POA: Insufficient documentation

## 2014-01-29 DIAGNOSIS — Z791 Long term (current) use of non-steroidal anti-inflammatories (NSAID): Secondary | ICD-10-CM | POA: Insufficient documentation

## 2014-01-29 DIAGNOSIS — Z794 Long term (current) use of insulin: Secondary | ICD-10-CM | POA: Insufficient documentation

## 2014-01-29 DIAGNOSIS — M25512 Pain in left shoulder: Secondary | ICD-10-CM | POA: Insufficient documentation

## 2014-01-29 MED ORDER — HYDROCODONE-ACETAMINOPHEN 5-325 MG PO TABS
2.0000 | ORAL_TABLET | Freq: Once | ORAL | Status: AC
  Administered 2014-01-29: 2 via ORAL
  Filled 2014-01-29: qty 2

## 2014-01-29 NOTE — ED Notes (Signed)
Pt ambulatory upon discharge. She reports that she is leaving with ALL belongings she arrived with.

## 2014-01-29 NOTE — ED Provider Notes (Signed)
CSN: [id number]     Arrival date & time 01/29/14  2030 History   First MD Initiated Contact with Patient 01/29/14 2135     Chief Complaint  Patient presents with  . Shoulder Pain     (Consider location/radiation/quality/duration/timing/severity/associated sxs/prior Treatment) HPI  [name] is a 43 y.o. female complaining of exacerbation of pain to left shoulder outpatient twisted quickly earlier in the night. She denies any trauma. Patient was seen for similar several days ago, has not been able to fill her prescription to 2 point. Issues. She states that it is very painful to abduct the arm greater than 90. She denies trauma, weakness, numbness, paresthesia. She is left-hand dominant.  Past Medical History  Diagnosis Date  . Diabetes mellitus   . GERD (gastroesophageal reflux disease)   . Cellulitis 07/2011   Past Surgical History  Procedure Laterality Date  . Cholecystectomy    . Knee arthroscopy    . Laparoscopic tubal ligation  04/29/2011    Procedure: LAPAROSCOPIC TUBAL LIGATION;  Surgeon: Turner Danielsavid C Lowe, MD;  Location: [facility];  Service: Gynecology;  Laterality: Bilateral;  with Filshie Clips  . Ablation     Family History  Problem Relation Age of Onset  . Cancer Mother   . Diabetes Mother   . Hypertension Mother    History  Substance Use Topics  . Smoking status: Former Smoker -- 0.50 packs/day for 2 years    Types: Cigarettes    Quit date: 07/22/2011  . Smokeless tobacco: Never Used  . Alcohol Use: Yes     Comment: occasional   OB History   Grav Para Term Preterm Abortions TAB SAB Ect Mult Living                 Review of Systems  10 systems reviewed and found to be negative, except as noted in the HPI.   Allergies  Vancomycin and Adhesive  Home Medications   Prior to Admission medications   Medication Sig Start Date End Date Taking? Authorizing Provider  furosemide (LASIX) 20 MG tablet Take 1 tablet (20 mg total) by mouth daily. 05/14/13  Yes  Doris Cheadleeepak Advani, MD  insulin NPH-regular (NOVOLIN 70/30) (70-30) 100 UNIT/ML injection Inject 13 Units into the skin 2 (two) times daily with a meal. 04/08/13  Yes Nishant Dhungel, MD  metFORMIN (GLUCOPHAGE) 500 MG tablet Take 2 tablets (1,000 mg total) by mouth 2 (two) times daily with a meal. 04/08/13  Yes Nishant Dhungel, MD  omeprazole (PRILOSEC OTC) 20 MG tablet Take 20 mg by mouth daily.   Yes Historical Provider, MD  diazepam (VALIUM) 5 MG tablet Take 1 tablet (5 mg total) by mouth 2 (two) times daily. 01/27/14   Antony MaduraKelly Humes, PA-C  naproxen (NAPROSYN) 500 MG tablet Take 1 tablet (500 mg total) by mouth 2 (two) times daily. 01/27/14   Antony MaduraKelly Humes, PA-C  traMADol (ULTRAM) 50 MG tablet Take 1 tablet (50 mg total) by mouth every 6 (six) hours as needed. 01/27/14   Antony MaduraKelly Humes, PA-C   BP 99/62  Pulse 102  Temp(Src) 97.9 F (36.6 C) (Oral)  Resp 18  Ht 5\' 1"  (1.549 m)  Wt 330 lb (149.687 kg)  BMI 62.39 kg/m2  SpO2 96% Physical Exam  Nursing note and vitals reviewed. Constitutional: She is oriented to person, place, and time. She appears well-developed and well-nourished. No distress.  HENT:  Head: Normocephalic and atraumatic.  Mouth/Throat: Oropharynx is clear and moist.  Eyes: Conjunctivae and EOM are  normal. Pupils are equal, round, and reactive to light.  Neck: Normal range of motion. Neck supple.  Cardiovascular: Normal rate, regular rhythm and intact distal pulses.   Pulmonary/Chest: Effort normal and breath sounds normal. No stridor.  Abdominal: Soft. Bowel sounds are normal.  Genitourinary:  Left shoulder:  Shoulder with no deformity. Patient can only abduct to 90, drop arm is negative. She is tender to palpation of the anterior rotator cuff musculature.Neurovascularly intact   Musculoskeletal: Normal range of motion.  Neurological: She is alert and oriented to person, place, and time.  Psychiatric: She has a normal mood and affect.    ED Course  Procedures (including  critical care time) Labs Review Labs Reviewed - No data to display  Imaging Review Dg Shoulder Left  01/29/2014   CLINICAL DATA:  Shoulder pain without injury.  Initial encounter.  EXAM: LEFT SHOULDER - 2+ VIEW  COMPARISON:  01/27/2014  FINDINGS: There is no evidence of fracture or dislocation. Mild acromioclavicular joint degeneration with small inferior spurs. No glenohumeral joint narrowing.  IMPRESSION: 1.  No acute findings. 2. Mild AC joint osteoarthritis.   Electronically Signed   By: Tiburcio PeaJonathan  Watts M.D.   On: 01/29/2014 21:30     EKG Interpretation None      MDM   Final diagnoses:  Shoulder arthralgia, left    Filed Vitals:   01/29/14 2039 01/29/14 2200  BP: 111/76 99/62  Pulse: 92 102  Temp: 97.9 F (36.6 C)   TempSrc: Oral   Resp: 16 18  Height: 5\' 1"  (1.549 m)   Weight: 330 lb (149.687 kg)   SpO2: 99% 96%    Medications  HYDROcodone-acetaminophen (NORCO/VICODIN) 5-325 MG per tablet 2 tablet (2 tablets Oral Given 01/29/14 2200)    [name] is a 43 y.o. female presenting with dramatic exacerbation of chronic left shoulder pain. Triage initiated x-rays negative. Patient will be given a sling and encouraged to follow closely with orthopedics, suspect chronic rotator cuff issues. A shift prescription for pain medication at home.   Evaluation does not show pathology that would require ongoing emergent intervention or inpatient treatment. Pt is hemodynamically stable and mentating appropriately. Discussed findings and plan with patient/guardian, who agrees with care plan. All questions answered. Return precautions discussed and outpatient follow up given.         Joni Reiningicole Dorothee Napierkowski, PA-C 01/30/14 0001

## 2014-01-29 NOTE — ED Notes (Signed)
Pt reports L shoulder pain, reports she turned to the right when she hurt it.  Pt reports being seem here Sunday for same.

## 2014-01-29 NOTE — Discharge Instructions (Signed)
Only use the arm sling for up to 2 days. Take the arm out and rotate the shoulder every 4 hours.   Do not hesitate to return to the emergency room for any new, worsening or concerning symptoms.  Please obtain primary care using resource guide below. But the minute you were seen in the emergency room and that they will need to obtain records for further outpatient management.   Shoulder Pain The shoulder is the joint that connects your arm to your body. Muscles and band-like tissues that connect bones to muscles (tendons) hold the joint together. Shoulder pain is felt if an injury or medical problem affects one or more parts of the shoulder. HOME CARE   Put ice on the sore area.  Put ice in a plastic bag.  Place a towel between your skin and the bag.  Leave the ice on for 15-20 minutes, 03-04 times a day for the first 2 days.  Stop using cold packs if they do not help with the pain.  If you were given something to keep your shoulder from moving (sling; shoulder immobilizer), wear it as told. Only take it off to shower or bathe.  Move your arm as little as possible, but keep your hand moving to prevent puffiness (swelling).  Squeeze a soft ball or foam pad as much as possible to help prevent swelling.  Take medicine as told by your doctor. GET HELP IF:  You have progressing new pain in your arm, hand, or fingers.  Your hand or fingers get cold.  Your medicine does not help lessen your pain. GET HELP RIGHT AWAY IF:   Your arm, hand, or fingers are numb or tingling.  Your arm, hand, or fingers are puffy (swollen), painful, or turn white or blue. MAKE SURE YOU:   Understand these instructions.  Will watch your condition.  Will get help right away if you are not doing well or get worse. Document Released: 09/21/2007 Document Revised: 08/19/2013 Document Reviewed: 10/17/2011 [employer] Patient Information 2015 [place], [place]. This information is not intended to replace advice  given to you by your health care provider. Make sure you discuss any questions you have with your health care provider.    Emergency Department Resource Guide 1) Find a Doctor and Pay Out of Pocket Although you won't have to find out who is covered by your insurance plan, it is a good idea to ask around and get recommendations. You will then need to call the office and see if the doctor you have chosen will accept you as a new patient and what types of options they offer for patients who are self-pay. Some doctors offer discounts or will set up payment plans for their patients who do not have insurance, but you will need to ask so you aren't surprised when you get to your appointment.  2) Contact Your Local Health Department Not all health departments have doctors that can see patients for sick visits, but many do, so it is worth a call to see if yours does. If you don't know where your local health department is, you can check in your phone book. The CDC also has a tool to help you locate your state's health department, and many state websites also have listings of all of their local health departments.  3) Find a Walk-in Clinic If your illness is not likely to be very severe or complicated, you may want to try a walk in clinic. These are popping up all over  the country in pharmacies, drugstores, and shopping centers. They're usually staffed by nurse practitioners or physician assistants that have been trained to treat common illnesses and complaints. They're usually fairly quick and inexpensive. However, if you have serious medical issues or chronic medical problems, these are probably not your best option.  No Primary Care Doctor: - Call Health Connect at  [phone] - they can help you locate a primary care doctor that  accepts your insurance, provides certain services, etc. - Physician Referral Service- [phone]  Chronic Pain Problems: Organization         Address  Phone   Notes  [facility]  [phone] Patients need to be referred by their primary care doctor.   Medication Assistance: Organization         Address  Phone   Notes  [facility] Medication [facility] [address] [place] [place], [place] [address] [phone] --Must be a [facility] of [facility] -- Must have NO insurance coverage whatsoever (no Medicaid/ Medicare, etc.) -- The pt. MUST have a primary care doctor that directs their care regularly and follows them in the community   [facility]  [phone]   [employer]  [phone]    Agencies that provide inexpensive medical care: Organization         Address  Phone   Notes  [facility]  [phone]   [facility]    [phone]   [facility] [address] [place], [place] [address] [phone]   [facility] [address] [place], [place] [phone]   Planned Parenthood    [phone]   [facility]    [phone]   Community Health and [facility] [place]  [address], [place] Phone:  [phone], Fax:  [phone] Hours of Operation:  9 am - 6 pm, M-F.  Also accepts Medicaid/Medicare and self-pay.  [facility]  [address], [place] Phone: [phone], Fax: [phone]. Hours of Operation:  8:30 am - 5:30 pm, M-F.  Also accepts Medicaid and self-pay.  [facility] [place] [address] [place] Phone: [phone]   [facility] [address] Natasha Bence [place], [place] [phone], Ext. [phone] Mondays & Thursdays: 7-9 AM.  First 15 patients are seen on a first come, first serve basis.    Medicaid-accepting [facility] Providers:  Organization         Address  Phone   Notes  [facility] [address], [place] [phone] Also accepts self-pay patients.  [facility] [address] Laurell Josephs [place], [place]  [phone]   [facility] [address], [place] [phone]   [facility] [address], [place] [phone]   Renaye Rakers [address], [place]   [phone] Only accepts [place] patients after they have their name applied to their card.   Self-Pay (no insurance) in [facility]:  Organization         Address  Phone   Notes  Sickle Cell Patients, [facility] [address] [place], [place] [phone]   [facility] [address] [place], [place] [phone]   [facility] [place]  [address], [place] [phone]) [phone]   [facility]/Dr. Osei-Bonsu  [address] [place] [address], [place] or [address], [place] [phone]) [phone] Phone number for both [place] and [place] locations is the same.  [facility] [address], [place] [phone]) [phone]   [facility] [address] [place] [address], [place] or [address] [phone]) [phone]   [facility] [address], [place] [phone]) [phone], phone; [phone], fax Sees patients 1st and 3rd Saturday of every month.  Must not qualify for public or private insurance (i.e. Medicaid, Medicare, [place] Health Choice, Veterans' Benefits)  Household income should be no more than 200% of the poverty level The clinic cannot treat you if you are pregnant or think you are pregnant  Sexually transmitted diseases are not treated at the clinic.    Dental Care: Organization         Address  Phone  Notes  [facility] [address], [place] [phone] Accepts children up to age 45 who are enrolled in [place]icaid or [place] Health Choice; pregnant women with a Medicaid card; and children who have applied for  Medicaid or [place] Health Choice, but were declined, whose parents can pay a reduced fee at time of service.  [facility] [place]  [address], [place] [phone]) [phone] Accepts children up to age 45 who are enrolled in [place] or [place] Health Choice; pregnant women with a Medicaid card; and children who have applied for Medicaid or [place] Health Choice, but were declined, whose parents can pay a reduced fee at time of service.  [facility]  [address], [place] [phone]) [phone] Patients are seen by appointment only. Walk-ins are not accepted. [facility] will see patients 45 years of age and older. Monday - Tuesday (8am-5pm) Most Wednesdays (8:30-5pm) $30 per visit, cash only  [facility]  [address], [facility] [phone] Patients are seen by appointment only. Walk-ins are not accepted. [facility] will see patients 45 years of age and older. One Wednesday Evening (Monthly: Volunteer Based).  $30 per visit, cash only  [employer] [facility] [facility]  [phone] for adults; Children under age 664, call Graduate Pediatric Dentistry at [phone]) [phone]. Children aged 514-14, please call [phone] to request a pediatric application.  Dental services are provided in all areas of dental care including fillings, crowns and bridges, complete and partial dentures, implants, gum treatment, root canals, and extractions. Preventive care is also provided. Treatment is provided to both adults and children. Patients are selected via a lottery and there is often a waiting list.   [facility] [address], [place]  [phone]) [phone] [email or web address]   [facility] [address], [place], [place] [phone], Ext. [phone] Second and Fourth Thursday of each month, opens at 6:30 AM; Clinic ends at 9 AM.  Patients are seen on a first-come first-served basis, and a limited number  are seen during each clinic.   [facility]  [address], [place], [place] [phone]) [phone]   Eligibility Requirements You must have lived in [place], [place], or [place] counties for at least the last three months.   You cannot be eligible for state or federal sponsored [place] Cityhealthcare insurance, including CIGNAVeterans Administration, [place]icaid, or [employer]Medicare.  You generally cannot be eligible for healthcare insurance through your employer.    How to apply: Eligibility screenings are held every Tuesday and Wednesday afternoon from 1:00 pm until 4:00 pm. You do not need an appointment for the interview!  [facility] [address], [place], [place] [phone]   [facility]  [phone]   [facility]  [phone]   [facility]  [phone]    Behavioral Health Resources in the Community: Intensive Outpatient Programs Organization         Address  Phone  Notes  [facility] [address], [place], [place] [phone]   [facility] [address], [place], [place] [phone]   [facility] [address], [place], [place]  [phone]   [facility] [address],  [place], [place] [phone] or [phone]   Substance Abuse Resources Organization         Address  Phone  Notes  [facility]  [phone]   [facility]  [phone]   [facility]  [phone]   [facility]  [phone]   Residential & Outpatient Substance Abuse Program  [phone]   Psychological Services Organization         Address  Phone  Notes  [facility]  [phone]- [phone]   [facility]  [phone]- [phone]   [facility] [address], [place] [phone] or [phone]    Mobile Crisis Teams Organization         Address  Phone  Notes  Therapeutic  Alternatives, Mobile Crisis Care Unit  [phone]   [facility]  [address] [place], [place] [phone]   Doristine LocksSharon DeEsch [address] [place] [phone]    Self-Help/Support Groups Organization         Address  Phone             Notes  [facility] [place] - variety of support groups  336- [phone] Call for more information  Narcotics Anonymous (NA), [facility] [address], [place]  2 meetings at this location   Statisticianesidential Treatment Programs Organization         Address  Phone  Notes  [facility] [address],    [place]  [phone]   [facility]  [address], [place] [phone], [place], [place] [phone]   [facility] [address], [place] [phone] Admissions: 8am-3pm M-F  [facility] [address]    [place], [place] [phone]   [facility] [address], [place], [place] [phone]   [facility] [address]  [place], [place] [phone]   [facility] - Intensive Outpatient [address], [place], [place] [phone]   [facility] ([facility] [address]  [place], [place] [phone] or [phone]   [facility] ([facility]) [address] [place], [place] [phone] Accepts Medicaid  [facility] [address]  [place] [phone] Substance Abuse/Addiction Treatment   [facility] Health Resources Organization         Address  Phone  Notes  [facility]  [phone]) [phone]   Angie FavaJulie Brannon, PhD [address] [place], [place]   [phone] or [phone]) [phone]   [facility]   [phone]  [address] [place], [place] [phone]   [facility] [address], [place], [place] [phone] Insurance/Medicaid/sponsorship through [facility] and Families  [address]                                    [place], [place] [phone] Therapy/tele-psych/case  [facility] [address]   [place], [place] [phone]    Dr. Lolly Mustache  [phone]   [facility]  [employer] [facility] Dept. 1) [address], [place] 2) [address] [place] 3)  [address], [place] [phone]  [phone]   [facility] Child Abuse Hotline 276-806-4720 or [phone] (After Hours)

## 2014-01-30 NOTE — ED Provider Notes (Signed)
Medical screening examination/treatment/procedure(s) were performed by non-physician practitioner and as supervising physician I was immediately available for consultation/collaboration.   EKG Interpretation None       Muriah Harsha, MD 01/30/14 0005 

## 2014-02-05 ENCOUNTER — Other Ambulatory Visit: Payer: Self-pay | Admitting: Orthopedic Surgery

## 2014-02-05 DIAGNOSIS — M25512 Pain in left shoulder: Secondary | ICD-10-CM

## 2014-02-13 ENCOUNTER — Ambulatory Visit
Admission: RE | Admit: 2014-02-13 | Discharge: 2014-02-13 | Disposition: A | Discharge status: Home / Self Care | Payer: No Typology Code available for payment source | Source: Ambulatory Visit | Attending: Orthopedic Surgery | Admitting: Orthopedic Surgery

## 2014-02-13 DIAGNOSIS — M25512 Pain in left shoulder: Secondary | ICD-10-CM

## 2014-05-05 ENCOUNTER — Encounter (HOSPITAL_BASED_OUTPATIENT_CLINIC_OR_DEPARTMENT_OTHER): Payer: Self-pay | Admitting: *Deleted

## 2014-05-05 ENCOUNTER — Emergency Department (HOSPITAL_BASED_OUTPATIENT_CLINIC_OR_DEPARTMENT_OTHER): Payer: 59

## 2014-05-05 ENCOUNTER — Emergency Department (HOSPITAL_BASED_OUTPATIENT_CLINIC_OR_DEPARTMENT_OTHER)
Admission: EM | Admit: 2014-05-05 | Discharge: 2014-05-05 | Disposition: A | Discharge status: Home / Self Care | Payer: 59 | Attending: Emergency Medicine | Admitting: Emergency Medicine

## 2014-05-05 DIAGNOSIS — M62838 Other muscle spasm: Secondary | ICD-10-CM | POA: Diagnosis not present

## 2014-05-05 DIAGNOSIS — Z87891 Personal history of nicotine dependence: Secondary | ICD-10-CM | POA: Insufficient documentation

## 2014-05-05 DIAGNOSIS — E119 Type 2 diabetes mellitus without complications: Secondary | ICD-10-CM | POA: Diagnosis not present

## 2014-05-05 DIAGNOSIS — R509 Fever, unspecified: Secondary | ICD-10-CM | POA: Insufficient documentation

## 2014-05-05 DIAGNOSIS — Z791 Long term (current) use of non-steroidal anti-inflammatories (NSAID): Secondary | ICD-10-CM | POA: Diagnosis not present

## 2014-05-05 DIAGNOSIS — K219 Gastro-esophageal reflux disease without esophagitis: Secondary | ICD-10-CM | POA: Insufficient documentation

## 2014-05-05 DIAGNOSIS — Z794 Long term (current) use of insulin: Secondary | ICD-10-CM | POA: Diagnosis not present

## 2014-05-05 DIAGNOSIS — Z872 Personal history of diseases of the skin and subcutaneous tissue: Secondary | ICD-10-CM | POA: Diagnosis not present

## 2014-05-05 DIAGNOSIS — R1031 Right lower quadrant pain: Secondary | ICD-10-CM | POA: Diagnosis not present

## 2014-05-05 DIAGNOSIS — R112 Nausea with vomiting, unspecified: Secondary | ICD-10-CM | POA: Insufficient documentation

## 2014-05-05 DIAGNOSIS — Z79899 Other long term (current) drug therapy: Secondary | ICD-10-CM | POA: Diagnosis not present

## 2014-05-05 LAB — COMPREHENSIVE METABOLIC PANEL
ALT: 23 U/L (ref 0–35)
AST: 20 U/L (ref 0–37)
Albumin: 3.5 g/dL (ref 3.5–5.2)
Alkaline Phosphatase: 102 U/L (ref 39–117)
Anion gap: 6 (ref 5–15)
BUN: 10 mg/dL (ref 6–23)
CO2: 28 mmol/L (ref 19–32)
Calcium: 8.6 mg/dL (ref 8.4–10.5)
Chloride: 100 mEq/L (ref 96–112)
Creatinine, Ser: 0.6 mg/dL (ref 0.50–1.10)
GFR calc Af Amer: >90 mL/min (ref >90)
GFR calc non Af Amer: >90 mL/min (ref >90)
Glucose, Bld: 304 mg/dL — ABNORMAL HIGH (ref 70–99)
Potassium: 3.6 mmol/L (ref 3.5–5.1)
Sodium: 134 mmol/L — ABNORMAL LOW (ref 135–145)
Total Bilirubin: 0.7 mg/dL (ref 0.3–1.2)
Total Protein: 7.6 g/dL (ref 6.0–8.3)

## 2014-05-05 LAB — URINALYSIS, ROUTINE W REFLEX MICROSCOPIC
Bilirubin Urine: NEGATIVE
Glucose, UA: >1000 mg/dL — AB
Hgb urine dipstick: NEGATIVE
Ketones, ur: 15 mg/dL — AB
Nitrite: NEGATIVE
Protein, ur: NEGATIVE mg/dL
Specific Gravity, Urine: 1.043 — ABNORMAL HIGH (ref 1.005–1.030)
Urobilinogen, UA: 0.2 mg/dL (ref 0.0–1.0)
pH: 5 (ref 5.0–8.0)

## 2014-05-05 LAB — CBC WITH DIFFERENTIAL/PLATELET
Basophils Absolute: 0.1 10*3/uL (ref 0.0–0.1)
Basophils Relative: 1 % (ref 0–1)
Eosinophils Absolute: 0.3 10*3/uL (ref 0.0–0.7)
Eosinophils Relative: 3 % (ref 0–5)
HCT: 41.6 % (ref 36.0–46.0)
Hemoglobin: 13.7 g/dL (ref 12.0–15.0)
Lymphocytes Relative: 23 % (ref 12–46)
Lymphs Abs: 2.6 10*3/uL (ref 0.7–4.0)
MCH: 27.6 pg (ref 26.0–34.0)
MCHC: 32.9 g/dL (ref 30.0–36.0)
MCV: 83.7 fL (ref 78.0–100.0)
Monocytes Absolute: 0.7 10*3/uL (ref 0.1–1.0)
Monocytes Relative: 6 % (ref 3–12)
Neutro Abs: 7.7 10*3/uL (ref 1.7–7.7)
Neutrophils Relative %: 69 % (ref 43–77)
Platelets: 350 10*3/uL (ref 150–400)
RBC: 4.97 MIL/uL (ref 3.87–5.11)
RDW: 13.2 % (ref 11.5–15.5)
WBC: 11.3 10*3/uL — ABNORMAL HIGH (ref 4.0–10.5)

## 2014-05-05 LAB — LIPASE, BLOOD: Lipase: 35 U/L (ref 11–59)

## 2014-05-05 LAB — URINE MICROSCOPIC-ADD ON

## 2014-05-05 MED ORDER — HYDROMORPHONE HCL 1 MG/ML IJ SOLN
1.0000 mg | Freq: Once | INTRAMUSCULAR | Status: AC
Start: 2014-05-05 — End: 2014-05-05
  Administered 2014-05-05: 1 mg via INTRAVENOUS
  Filled 2014-05-05: qty 1

## 2014-05-05 MED ORDER — IOHEXOL 300 MG/ML  SOLN
100.0000 mL | Freq: Once | INTRAMUSCULAR | Status: AC | PRN
  Administered 2014-05-05: 100 mL via INTRAVENOUS

## 2014-05-05 MED ORDER — DIAZEPAM 5 MG PO TABS: 5.0000 mg | ORAL_TABLET | Freq: Four times a day (QID) | ORAL | Status: DC | PRN

## 2014-05-05 MED ORDER — IOHEXOL 300 MG/ML  SOLN
25.0000 mL | Freq: Once | INTRAMUSCULAR | Status: AC | PRN
  Administered 2014-05-05: 25 mL via ORAL

## 2014-05-05 NOTE — Discharge Instructions (Signed)

## 2014-05-05 NOTE — ED Provider Notes (Addendum)
CSN: [id number]     Arrival date & time 05/05/14  1758 History  This chart was scribed for Brooke Mo, MD by Abel Presto, ED Scribe. This patient was seen in room MH09/MH09 and the patient's care was started at 6:57 PM.    Chief Complaint  Patient presents with  . Abdominal Pain     Patient is a 44 y.o. female presenting with abdominal pain. The history is provided by the patient. No language interpreter was used.  Abdominal Pain Pain location:  RLQ Pain quality: sharp   Pain radiates to:  R flank Pain severity:  Moderate Onset quality:  Sudden Timing:  Intermittent Progression:  Unchanged Chronicity:  New Associated symptoms: fever, nausea and vomiting   Associated symptoms: no cough, no dysuria and no hematuria     HPI Comments: [name] is a 44 y.o. female with PMHx of DM, GERD, cellulitis and PSH of cholecystectomy and laparoscopic tubal ligation who presents to the Emergency Department complaining of intermittent RLQ abdominal pain with sudden onset PTA. Pt notes nausea for one week, diarrhea, vomiting once last week, and a fever of as high as 100.4. Pt notes pain worsens when she sits up. Pt denies dysuria, hematuria, cough, congestion, and sore throat.   Past Medical History  Diagnosis Date  . Diabetes mellitus   . GERD (gastroesophageal reflux disease)   . Cellulitis 07/2011   Past Surgical History  Procedure Laterality Date  . Cholecystectomy    . Knee arthroscopy    . Laparoscopic tubal ligation  04/29/2011    Procedure: LAPAROSCOPIC TUBAL LIGATION;  Surgeon: Turner Daniels, MD;  Location: [facility];  Service: Gynecology;  Laterality: Bilateral;  with Filshie Clips  . Ablation     Family History  Problem Relation Age of Onset  . Cancer Mother   . Diabetes Mother   . Hypertension Mother    History  Substance Use Topics  . Smoking status: Former Smoker -- 0.50 packs/day for 2 years    Types: Cigarettes    Quit date: 07/22/2011  . Smokeless tobacco:  Never Used  . Alcohol Use: Yes     Comment: occasional   OB History    No data available     Review of Systems  Constitutional: Positive for fever.  HENT: Negative for congestion.   Respiratory: Negative for cough.   Gastrointestinal: Positive for nausea, vomiting and abdominal pain.  Genitourinary: Negative for dysuria and hematuria.  All other systems reviewed and are negative.     Allergies  Vancomycin and Adhesive  Home Medications   Prior to Admission medications   Medication Sig Start Date End Date Taking? Authorizing Provider  diazepam (VALIUM) 5 MG tablet Take 1 tablet (5 mg total) by mouth every 6 (six) hours as needed (spasms). 05/05/14   Brooke Mo, MD  furosemide (LASIX) 20 MG tablet Take 1 tablet (20 mg total) by mouth daily. 05/14/13   Doris Cheadle, MD  insulin NPH-regular (NOVOLIN 70/30) (70-30) 100 UNIT/ML injection Inject 13 Units into the skin 2 (two) times daily with a meal. 04/08/13   Nishant Dhungel, MD  metFORMIN (GLUCOPHAGE) 500 MG tablet Take 2 tablets (1,000 mg total) by mouth 2 (two) times daily with a meal. 04/08/13   Nishant Dhungel, MD  naproxen (NAPROSYN) 500 MG tablet Take 1 tablet (500 mg total) by mouth 2 (two) times daily. 01/27/14   Antony Madura, PA-C  omeprazole (PRILOSEC OTC) 20 MG tablet Take 20 mg by mouth daily.  Historical Provider, MD  traMADol (ULTRAM) 50 MG tablet Take 1 tablet (50 mg total) by mouth every 6 (six) hours as needed. 01/27/14   Antony Madura, PA-C   BP 102/55 mmHg  Pulse 107  Temp(Src) 97.6 F (36.4 C) (Oral)  Resp 18  Ht 5' 1.5" (1.562 m)  Wt 308 lb (139.708 kg)  BMI 57.26 kg/m2  SpO2 97% Physical Exam  Constitutional: She is oriented to person, place, and time. She appears well-developed and well-nourished.  HENT:  Head: Normocephalic and atraumatic.  Right Ear: External ear normal.  Left Ear: External ear normal.  Eyes: Conjunctivae and EOM are normal. Pupils are equal, round, and reactive to light.   Neck: Normal range of motion. Neck supple.  Cardiovascular: Normal rate, regular rhythm, normal heart sounds and intact distal pulses.   Pulmonary/Chest: Effort normal and breath sounds normal.  Abdominal: Soft. Bowel sounds are normal. There is tenderness (and R flank, no CVA) in the right lower quadrant.  Musculoskeletal: Normal range of motion.  Neurological: She is alert and oriented to person, place, and time.  Skin: Skin is warm and dry.  Vitals reviewed.   ED Course  Procedures (including critical care time) DIAGNOSTIC STUDIES: Oxygen Saturation is 97% on room air, normal by my interpretation.    COORDINATION OF CARE: 7:01 PM Discussed treatment plan with patient at beside, the patient agrees with the plan and has no further questions at this time.   Labs Review Labs Reviewed  URINALYSIS, ROUTINE W REFLEX MICROSCOPIC - Abnormal; Notable for the following:    APPearance CLOUDY (*)    Specific Gravity, Urine 1.043 (*)    Glucose, UA >1000 (*)    Ketones, ur 15 (*)    Leukocytes, UA SMALL (*)    All other components within normal limits  CBC WITH DIFFERENTIAL - Abnormal; Notable for the following:    WBC 11.3 (*)    All other components within normal limits  COMPREHENSIVE METABOLIC PANEL - Abnormal; Notable for the following:    Sodium 134 (*)    Glucose, Bld 304 (*)    All other components within normal limits  URINE MICROSCOPIC-ADD ON - Abnormal; Notable for the following:    Squamous Epithelial / LPF FEW (*)    Bacteria, UA FEW (*)    All other components within normal limits  LIPASE, BLOOD    Imaging Review Ct Abdomen Pelvis W Contrast  05/05/2014   CLINICAL DATA:  Initial encounter for right lower quadrant pain with nausea for 1 week. Diarrhea. Fever.  EXAM: CT ABDOMEN AND PELVIS WITH CONTRAST  TECHNIQUE: Multidetector CT imaging of the abdomen and pelvis was performed using the standard protocol following bolus administration of intravenous contrast.  CONTRAST:   OMNIPAQUE IOHEXOL 300 MG/ML  SOLN  COMPARISON:  11/11/2013  FINDINGS: Lower chest: Clear lung bases. Normal heart size without pericardial or pleural effusion.  Hepatobiliary: Marked hepatomegaly, 21.5 cm craniocaudal. Marked hepatic steatosis, with caudate lobe prominence. No focal liver lesion. Cholecystectomy, without biliary ductal dilatation.  Pancreas: Normal, without mass or ductal dilatation.  Spleen: Normal  Adrenals/Urinary Tract: Normal adrenal glands. Too small to characterize interpolar right renal lesion. Minimal bilateral renal sinus cysts, without hydronephrosis. Normal urinary bladder.  Stomach/Bowel: Normal stomach, without wall thickening. Colonic stool burden suggests constipation. Normal terminal ileum. Normal appendix on image 65 Normal small bowel.  Vascular/Lymphatic: Normal caliber of the aorta and branch vessels. Prominent portacaval node is likely reactive and related to steatosis. No pelvic adenopathy.  Reproductive: Tubal ligation. Normal uterus. Low-density left ovarian lesions are likely follicles.  Other: No significant free fluid.  Musculoskeletal: Bilateral L5 pars defects.  IMPRESSION: 1.  Possible constipation. 2. No other explanation for patient's symptoms. 3. Hepatic steatosis and hepatomegaly.   Electronically Signed   By: Jeronimo GreavesKyle  Talbot M.D.   On: 05/05/2014 20:23     EKG Interpretation None      MDM   Final diagnoses:  RLQ abdominal pain  Muscle spasm    44 y.o. female with pertinent PMH of DM presents with RLQ abd pain. No dysuria.  Physical exam as above.  Wu with unremarkable CT.  Likely recurrent muscle spasm, as pt has ho same.  DC home in stable condition.    I have reviewed all laboratory and imaging studies if ordered as above  1. Muscle spasm   2. RLQ abdominal pain          Brooke MoMatthew Deedra Pro, MD 05/06/14 1502  Brooke MoMatthew Junnie Loschiavo, MD 05/06/14 [phone]

## 2014-05-05 NOTE — ED Notes (Signed)
Abdominal pain in her right lower quadrant tonight.

## 2014-05-05 NOTE — ED Notes (Signed)
Patient transported to CT 

## 2014-06-09 ENCOUNTER — Emergency Department (HOSPITAL_BASED_OUTPATIENT_CLINIC_OR_DEPARTMENT_OTHER)
Admission: EM | Admit: 2014-06-09 | Discharge: 2014-06-09 | Disposition: A | Discharge status: Home / Self Care | Payer: 59 | Attending: Emergency Medicine | Admitting: Emergency Medicine

## 2014-06-09 ENCOUNTER — Encounter (HOSPITAL_BASED_OUTPATIENT_CLINIC_OR_DEPARTMENT_OTHER): Payer: Self-pay | Admitting: *Deleted

## 2014-06-09 DIAGNOSIS — R51 Headache: Secondary | ICD-10-CM | POA: Diagnosis present

## 2014-06-09 DIAGNOSIS — Z79899 Other long term (current) drug therapy: Secondary | ICD-10-CM | POA: Diagnosis not present

## 2014-06-09 DIAGNOSIS — Z791 Long term (current) use of non-steroidal anti-inflammatories (NSAID): Secondary | ICD-10-CM | POA: Insufficient documentation

## 2014-06-09 DIAGNOSIS — E119 Type 2 diabetes mellitus without complications: Secondary | ICD-10-CM | POA: Diagnosis not present

## 2014-06-09 DIAGNOSIS — Z87891 Personal history of nicotine dependence: Secondary | ICD-10-CM | POA: Insufficient documentation

## 2014-06-09 DIAGNOSIS — Z872 Personal history of diseases of the skin and subcutaneous tissue: Secondary | ICD-10-CM | POA: Insufficient documentation

## 2014-06-09 DIAGNOSIS — Z794 Long term (current) use of insulin: Secondary | ICD-10-CM | POA: Diagnosis not present

## 2014-06-09 DIAGNOSIS — K219 Gastro-esophageal reflux disease without esophagitis: Secondary | ICD-10-CM | POA: Insufficient documentation

## 2014-06-09 DIAGNOSIS — R519 Headache, unspecified: Secondary | ICD-10-CM

## 2014-06-09 LAB — CBG MONITORING, ED: Glucose-Capillary: 150 mg/dL — ABNORMAL HIGH (ref 70–99)

## 2014-06-09 MED ORDER — METOCLOPRAMIDE HCL 5 MG/ML IJ SOLN
10.0000 mg | Freq: Once | INTRAMUSCULAR | Status: AC
  Administered 2014-06-09: 10 mg via INTRAVENOUS
  Filled 2014-06-09: qty 2

## 2014-06-09 MED ORDER — DIPHENHYDRAMINE HCL 50 MG/ML IJ SOLN
50.0000 mg | Freq: Once | INTRAMUSCULAR | Status: AC
  Administered 2014-06-09: 50 mg via INTRAVENOUS
  Filled 2014-06-09: qty 1

## 2014-06-09 MED ORDER — KETOROLAC TROMETHAMINE 30 MG/ML IJ SOLN
30.0000 mg | Freq: Once | INTRAMUSCULAR | Status: AC
  Administered 2014-06-09: 30 mg via INTRAVENOUS
  Filled 2014-06-09: qty 1

## 2014-06-09 MED ORDER — SODIUM CHLORIDE 0.9 % IV BOLUS (SEPSIS)
1000.0000 mL | Freq: Once | INTRAVENOUS | Status: AC
  Administered 2014-06-09: 1000 mL via INTRAVENOUS

## 2014-06-09 NOTE — ED Notes (Signed)
CBG rersult was 150 mg. / dcltr.

## 2014-06-09 NOTE — ED Notes (Signed)
Pt admits to onset of headache that began today - pt states she recently had her diabetes medications changed and her blood sugar was low for her - CBG 114 at home. Pt w/ family hx of migraines as well.

## 2014-06-09 NOTE — Discharge Instructions (Signed)

## 2014-06-09 NOTE — ED Provider Notes (Signed)
TIME SEEN: 1:40 AM  CHIEF COMPLAINT: Headache  HPI: Pt is a 44 y.o. female with history of diabetes, GERD who presents emergency department headache that started at 10 PM tonight. Describes it as diffuse, sharp. No aggravating or relieving factors. She states she did not take anything at home. States she does not normally get headaches but denies that this is the worst headache of her life for a thunderclap headache. No head injury. No fever. No neck pain or neck stiffness. No numbness, tingling or focal weakness. Not on anticoagulation.  ROS: See HPI Constitutional: no fever  Eyes: no drainage  ENT: no runny nose   Cardiovascular:  no chest pain  Resp: no SOB  GI: no vomiting GU: no dysuria Integumentary: no rash  Allergy: no hives  Musculoskeletal: no leg swelling  Neurological: no slurred speech ROS otherwise negative  PAST MEDICAL HISTORY/PAST SURGICAL HISTORY:  Past Medical History  Diagnosis Date  . Diabetes mellitus   . GERD (gastroesophageal reflux disease)   . Cellulitis 07/2011    MEDICATIONS:  Prior to Admission medications   Medication Sig Start Date End Date Taking? Authorizing Provider  diazepam (VALIUM) 5 MG tablet Take 1 tablet (5 mg total) by mouth every 6 (six) hours as needed (spasms). 05/05/14   Mirian MoMatthew Gentry, MD  furosemide (LASIX) 20 MG tablet Take 1 tablet (20 mg total) by mouth daily. 05/14/13   Doris Cheadleeepak Advani, MD  insulin NPH-regular (NOVOLIN 70/30) (70-30) 100 UNIT/ML injection Inject 13 Units into the skin 2 (two) times daily with a meal. 04/08/13   Nishant Dhungel, MD  metFORMIN (GLUCOPHAGE) 500 MG tablet Take 2 tablets (1,000 mg total) by mouth 2 (two) times daily with a meal. 04/08/13   Nishant Dhungel, MD  naproxen (NAPROSYN) 500 MG tablet Take 1 tablet (500 mg total) by mouth 2 (two) times daily. 01/27/14   Antony MaduraKelly Humes, PA-C  omeprazole (PRILOSEC OTC) 20 MG tablet Take 20 mg by mouth daily.    Historical Provider, MD  traMADol (ULTRAM) 50 MG tablet  Take 1 tablet (50 mg total) by mouth every 6 (six) hours as needed. 01/27/14   Antony MaduraKelly Humes, PA-C    ALLERGIES:  Allergies  Allergen Reactions  . Vancomycin     Red man syndrome  . Adhesive [Tape] Itching and Rash    SOCIAL HISTORY:  History  Substance Use Topics  . Smoking status: Former Smoker -- 0.50 packs/day for 2 years    Types: Cigarettes    Quit date: 07/22/2011  . Smokeless tobacco: Never Used  . Alcohol Use: Yes     Comment: occasional    FAMILY HISTORY: Family History  Problem Relation Age of Onset  . Cancer Mother   . Diabetes Mother   . Hypertension Mother     EXAM: BP 146/64 mmHg  Pulse 98  Temp(Src) 98 F (36.7 C) (Oral)  Resp 18  Ht 5\' 1"  (1.549 m)  Wt 294 lb (133.358 kg)  BMI 55.58 kg/m2  SpO2 92% CONSTITUTIONAL: Alert and oriented and responds appropriately to questions. Well-appearing; well-nourished, nontoxic, smiling, obese HEAD: Normocephalic EYES: Conjunctivae clear, PERRL ENT: normal nose; no rhinorrhea; moist mucous membranes; pharynx without lesions noted NECK: Supple, no meningismus, no LAD  CARD: RRR; S1 and S2 appreciated; no murmurs, no clicks, no rubs, no gallops RESP: Normal chest excursion without splinting or tachypnea; breath sounds clear and equal bilaterally; no wheezes, no rhonchi, no rales,  ABD/GI: Normal bowel sounds; non-distended; soft, non-tender, no rebound, no guarding BACK:  The back appears normal and is non-tender to palpation, there is no CVA tenderness EXT: Normal ROM in all joints; non-tender to palpation; no edema; normal capillary refill; no cyanosis    SKIN: Normal color for age and race; warm NEURO: Moves all extremities equally, sensation to light touch intact diffusely, cranial nerves II through XII intact, normal gait PSYCH: The patient's mood and manner are appropriate. Grooming and personal hygiene are appropriate.  MEDICAL DECISION MAKING: Patient here with what appears to be a very benign headache. No  meningismus and no neurologic deficits. Denies that this is the worst headache of her life, sudden onset or thunderclap headache. Doubt intracranial hemorrhage. Will give Toradol, Reglan, Benadryl and IV fluids and reassess. She needs head imaging at this time.  ED PROGRESS: 3:00 AM  BG is 150. Headache completely gone after migraine cocktail. We'll discharge home. Have advised her to use Tylenol and ibuprofen over-the-counter as needed for headaches in the future. Discussed return precautions. She verbalized understanding and is comfortable with plan.  Layla Maw Tamsen Reist, DO 06/09/14 [phone]

## 2014-07-24 ENCOUNTER — Encounter (HOSPITAL_COMMUNITY): Payer: Self-pay | Admitting: *Deleted

## 2014-07-24 ENCOUNTER — Emergency Department (HOSPITAL_COMMUNITY)
Admission: EM | Admit: 2014-07-24 | Discharge: 2014-07-24 | Disposition: A | Discharge status: Home / Self Care | Payer: 59 | Attending: Emergency Medicine | Admitting: Emergency Medicine

## 2014-07-24 DIAGNOSIS — M778 Other enthesopathies, not elsewhere classified: Secondary | ICD-10-CM | POA: Diagnosis not present

## 2014-07-24 DIAGNOSIS — Z79899 Other long term (current) drug therapy: Secondary | ICD-10-CM | POA: Insufficient documentation

## 2014-07-24 DIAGNOSIS — Z791 Long term (current) use of non-steroidal anti-inflammatories (NSAID): Secondary | ICD-10-CM | POA: Diagnosis not present

## 2014-07-24 DIAGNOSIS — K219 Gastro-esophageal reflux disease without esophagitis: Secondary | ICD-10-CM | POA: Diagnosis not present

## 2014-07-24 DIAGNOSIS — M25522 Pain in left elbow: Secondary | ICD-10-CM | POA: Diagnosis present

## 2014-07-24 DIAGNOSIS — Z872 Personal history of diseases of the skin and subcutaneous tissue: Secondary | ICD-10-CM | POA: Insufficient documentation

## 2014-07-24 DIAGNOSIS — Z87891 Personal history of nicotine dependence: Secondary | ICD-10-CM | POA: Diagnosis not present

## 2014-07-24 DIAGNOSIS — E119 Type 2 diabetes mellitus without complications: Secondary | ICD-10-CM | POA: Diagnosis not present

## 2014-07-24 MED ORDER — DICLOFENAC SODIUM 1 % TD GEL: 2.0000 g | Freq: Four times a day (QID) | TRANSDERMAL | Status: DC

## 2014-07-24 MED ORDER — MELOXICAM 7.5 MG PO TABS: 15.0000 mg | ORAL_TABLET | Freq: Every day | ORAL | Status: DC

## 2014-07-24 NOTE — Discharge Instructions (Signed)
Tennis Elbow Your caregiver has diagnosed you with a condition often referred to as "tennis elbow." This results from small tears or soreness (inflammation) at the start (origin) of the extensor muscles of the forearm. Although the condition is often called tennis or golfer's elbow, it is caused by any repetitive action performed by your elbow. HOME CARE INSTRUCTIONS  If the condition has been short lived, rest may be the only treatment required. Using your opposite hand or arm to perform the task may help. Even changing your grip may help rest the extremity. These may even prevent the condition from recurring.  Longer standing problems, however, will often be relieved faster by:  Using anti-inflammatory agents.  Applying ice packs for 30 minutes at the end of the working day, at bed time, or when activities are finished.  Your caregiver may also have you wear a splint or sling. This will allow the inflamed tendon to heal. At times, steroid injections aided with a local anesthetic will be required along with splinting for 1 to 2 weeks. Two to three steroid injections will often solve the problem. In some long standing cases, the inflamed tendon does not respond to conservative (non-surgical) therapy. Then surgery may be required to repair it. MAKE SURE YOU:   Understand these instructions.  Will watch your condition.  Will get help right away if you are not doing well or get worse. Document Released: 04/04/2005 Document Revised: 06/27/2011 Document Reviewed: 11/21/2007 [employer] Patient Information 2015 [employer]. This information is not intended to replace advice given to you by your health care provider. Make sure you discuss any questions you have with your health care provider.  

## 2014-07-24 NOTE — ED Notes (Addendum)
Pt reports L elbow pain that radiates to her L hand and finger.  Pt reports tingling and swelling and stiffness when she wakes up in the am.  Pt reports onset of sxs x 1 week ago.  Pt reports pending MRI on her R shoulder for "frozen shoulder."

## 2014-07-24 NOTE — ED Provider Notes (Signed)
CSN: [id number]     Arrival date & time 07/24/14  2033 History  This chart was scribed for non-physician practitioner, Antony Madura, PA-C, working with Raeford Razor, MD, by Roxy Cedar ED Scribe. This patient was seen in room WTR9/WTR9 and the patient's care was started at 10:23 PM    Chief Complaint  Patient presents with  . Elbow Pain   Patient is a 44 y.o. female presenting with arm injury. The history is provided by the patient. No language interpreter was used.  Arm Injury Location:  Elbow Injury: no   Elbow location:  L elbow Pain details:    Quality:  Aching and tingling   Radiates to:  L wrist   Severity:  Moderate   Onset quality:  Gradual   Timing:  Constant   Progression:  Waxing and waning Chronicity:  New Handedness:  Left-handed Dislocation: no   Foreign body present:  No foreign bodies  HPI Comments: [name] is a 44 y.o. female with a PMHx of diabetes, cholecystectomy, knee arthroscopy, GERD and cellulitis, who presents to the Emergency Department complaining of left elbow pain that worsens at night. She reports associated tingling and swelling to left elbow and swelling. She states it feels like "pins and needles". She reports pain radiating down to her left wrist. Patient states that she fell on her front steps a few weeks ago. Patient states that she has constant use of her left arm at work. She states that she has taken ibuprofen and tylenol with some relief. Patient is left handed.  Past Medical History  Diagnosis Date  . Diabetes mellitus   . GERD (gastroesophageal reflux disease)   . Cellulitis 07/2011   Past Surgical History  Procedure Laterality Date  . Cholecystectomy    . Knee arthroscopy    . Laparoscopic tubal ligation  04/29/2011    Procedure: LAPAROSCOPIC TUBAL LIGATION;  Surgeon: Turner Daniels, MD;  Location: [facility];  Service: Gynecology;  Laterality: Bilateral;  with Filshie Clips  . Ablation     Family History  Problem Relation Age  of Onset  . Cancer Mother   . Diabetes Mother   . Hypertension Mother    History  Substance Use Topics  . Smoking status: Former Smoker -- 0.50 packs/day for 2 years    Types: Cigarettes    Quit date: 07/22/2011  . Smokeless tobacco: Never Used  . Alcohol Use: Yes     Comment: occasional   OB History    No data available     Review of Systems  Musculoskeletal: Positive for myalgias, joint swelling and arthralgias.  All other systems reviewed and are negative.  Allergies  Vancomycin and Adhesive  Home Medications   Prior to Admission medications   Medication Sig Start Date End Date Taking? Authorizing Provider  diazepam (VALIUM) 5 MG tablet Take 1 tablet (5 mg total) by mouth every 6 (six) hours as needed (spasms). 05/05/14   Mirian Mo, MD  diclofenac sodium (VOLTAREN) 1 % GEL Apply 2 g topically 4 (four) times daily. To left elbow joint 07/24/14   Antony Madura, PA-C  furosemide (LASIX) 20 MG tablet Take 1 tablet (20 mg total) by mouth daily. 05/14/13   Doris Cheadle, MD  insulin NPH-regular (NOVOLIN 70/30) (70-30) 100 UNIT/ML injection Inject 13 Units into the skin 2 (two) times daily with a meal. 04/08/13   Nishant Dhungel, MD  meloxicam (MOBIC) 7.5 MG tablet Take 2 tablets (15 mg total) by mouth daily. 07/24/14  Antony MaduraKelly Tesslyn Baumert, PA-C  metFORMIN (GLUCOPHAGE) 500 MG tablet Take 2 tablets (1,000 mg total) by mouth 2 (two) times daily with a meal. 04/08/13   Nishant Dhungel, MD  naproxen (NAPROSYN) 500 MG tablet Take 1 tablet (500 mg total) by mouth 2 (two) times daily. 01/27/14   Antony MaduraKelly Jolin Benavides, PA-C  omeprazole (PRILOSEC OTC) 20 MG tablet Take 20 mg by mouth daily.    Historical Provider, MD  traMADol (ULTRAM) 50 MG tablet Take 1 tablet (50 mg total) by mouth every 6 (six) hours as needed. 01/27/14   Antony MaduraKelly Alexavier Tsutsui, PA-C   Triage Vitals: BP 111/63 mmHg  Pulse 106  Temp(Src) 98.4 F (36.9 C)  Resp 20  SpO2 96%  Physical Exam  Constitutional: She is oriented to person, place, and  time. She appears well-developed and well-nourished. No distress.  Nontoxic/nonseptic appearing  HENT:  Head: Normocephalic and atraumatic.  Eyes: Conjunctivae and EOM are normal. No scleral icterus.  Neck: Normal range of motion.  Cardiovascular: Normal rate, regular rhythm and intact distal pulses.   Distal radial pulse 2+ in the LUE. Capillary refill brisk in all digits  Pulmonary/Chest: Effort normal. No respiratory distress.  Respirations even and unlabored  Musculoskeletal: Normal range of motion.       Left elbow: She exhibits normal range of motion, no swelling, no effusion, no deformity and no laceration. Tenderness found. Lateral epicondyle tenderness noted.       Left forearm: She exhibits tenderness. She exhibits no bony tenderness, no swelling, no edema, no deformity and no laceration.       Arms: No bony deformity or crepitus  Neurological: She is alert and oriented to person, place, and time. She exhibits normal muscle tone. Coordination normal.  Sensation to light touch intact. Good grip strength in the left upper extremity  Skin: Skin is warm and dry. No rash noted. She is not diaphoretic. No erythema. No pallor.  Psychiatric: She has a normal mood and affect. Her behavior is normal.  Nursing note and vitals reviewed.   ED Course  Procedures (including critical care time)  DIAGNOSTIC STUDIES: Oxygen Saturation is 96% on RA, normal by my interpretation.    COORDINATION OF CARE: 10:26 PM- Discussed plans to give patient antiinflammatory medication. Advised patient to apply ice to affected area. Pt advised of plan for treatment and pt agrees.  Labs Review Labs Reviewed - No data to display  Imaging Review No results found.   EKG Interpretation None     MDM   Final diagnoses:  Tendonitis of elbow, left    44 year old female presents to the emergency department for left elbow pain. No hx of trauma/injury. Patient is neurovascularly intact. No bony deformity  or crepitus. Normal range of motion exhibited to the left elbow. No evidence of septic joint. Symptoms consistent with a tendinitis. This is likely from occupational overuse as she reports frequent repetitive movements of her left arm at work. Will manage with icing and NSAIDs. Primary care follow up advised and return precautions given. Patient agreeable to plan with no unaddressed concerns.  I personally performed the services described in this documentation, which was scribed in my presence. The recorded information has been reviewed and is accurate.   Filed Vitals:   07/24/14 2104  BP: 111/63  Pulse: 106  Temp: 98.4 F (36.9 C)  Resp: 20  SpO2: 96%     Antony MaduraKelly Eesa Justiss, PA-C 07/24/14 2321  Raeford RazorStephen Kohut, MD 07/28/14 [phone]

## 2015-02-04 ENCOUNTER — Emergency Department (HOSPITAL_COMMUNITY)
Admission: EM | Admit: 2015-02-04 | Discharge: 2015-02-04 | Disposition: A | Discharge status: Home / Self Care | Payer: 59 | Attending: Emergency Medicine | Admitting: Emergency Medicine

## 2015-02-04 ENCOUNTER — Encounter (HOSPITAL_COMMUNITY): Payer: Self-pay | Admitting: *Deleted

## 2015-02-04 DIAGNOSIS — E119 Type 2 diabetes mellitus without complications: Secondary | ICD-10-CM | POA: Diagnosis not present

## 2015-02-04 DIAGNOSIS — K219 Gastro-esophageal reflux disease without esophagitis: Secondary | ICD-10-CM | POA: Insufficient documentation

## 2015-02-04 DIAGNOSIS — J069 Acute upper respiratory infection, unspecified: Secondary | ICD-10-CM | POA: Insufficient documentation

## 2015-02-04 DIAGNOSIS — R05 Cough: Secondary | ICD-10-CM | POA: Diagnosis present

## 2015-02-04 DIAGNOSIS — Z794 Long term (current) use of insulin: Secondary | ICD-10-CM | POA: Insufficient documentation

## 2015-02-04 DIAGNOSIS — Z79899 Other long term (current) drug therapy: Secondary | ICD-10-CM | POA: Insufficient documentation

## 2015-02-04 DIAGNOSIS — Z87891 Personal history of nicotine dependence: Secondary | ICD-10-CM | POA: Diagnosis not present

## 2015-02-04 NOTE — Discharge Instructions (Signed)

## 2015-02-04 NOTE — ED Provider Notes (Signed)
CSN: [id number]     Arrival date & time 02/04/15  2047 History  By signing my name below, I, Tanda RockersMargaux Venter, attest that this documentation has been prepared under the direction and in the presence of Mohawk IndustriesJeff Dempsey Ahonen, PA-C. Electronically Signed: Tanda RockersMargaux Venter, ED Scribe. 02/04/2015. 10:09 PM.  Chief Complaint  Patient presents with  . Facial Pain  . Cough  . Otalgia   The history is provided by the patient. No language interpreter was used.     HPI Comments: [name] is a 44 y.o. female with hx DM and HTN who presents to the Emergency Department complaining of gradual onset, constant, facial pain, cough, post tussive vomiting, rhinorrhea, nasal congestion, right ear pain, and sore throat x 1 day.  She also complains of a fever of 101. Pt has never had symptoms like this in the past.She has been taking Tylenol and Robitussin without relief. Denies chills, or any other associated symptoms. Pt is current every day smoker. She admits that she is trying to quit.     Past Medical History  Diagnosis Date  . Diabetes mellitus   . GERD (gastroesophageal reflux disease)   . Cellulitis 07/2011   Past Surgical History  Procedure Laterality Date  . Cholecystectomy    . Knee arthroscopy    . Laparoscopic tubal ligation  04/29/2011    Procedure: LAPAROSCOPIC TUBAL LIGATION;  Surgeon: Turner Danielsavid C Lowe, MD;  Location: [facility];  Service: Gynecology;  Laterality: Bilateral;  with Filshie Clips  . Ablation    . Rotator cuff repair     Family History  Problem Relation Age of Onset  . Cancer Mother   . Diabetes Mother   . Hypertension Mother    Social History  Substance Use Topics  . Smoking status: Former Smoker -- 0.50 packs/day for 2 years    Types: Cigarettes    Quit date: 07/22/2011  . Smokeless tobacco: Never Used  . Alcohol Use: Yes     Comment: occasional   OB History    No data available     Review of Systems  A complete 10 system review of systems was obtained and all  systems are negative except as noted in the HPI and PMH.    Allergies  Vancomycin and Adhesive  Home Medications   Prior to Admission medications   Medication Sig Start Date End Date Taking? Authorizing Provider  furosemide (LASIX) 20 MG tablet Take 1 tablet (20 mg total) by mouth daily. 05/14/13  Yes Doris Cheadleeepak Advani, MD  insulin NPH-regular (NOVOLIN 70/30) (70-30) 100 UNIT/ML injection Inject 13 Units into the skin 2 (two) times daily with a meal. 04/08/13  Yes Nishant Dhungel, MD  metFORMIN (GLUCOPHAGE) 500 MG tablet Take 2 tablets (1,000 mg total) by mouth 2 (two) times daily with a meal. 04/08/13  Yes Nishant Dhungel, MD  omeprazole (PRILOSEC OTC) 20 MG tablet Take 20 mg by mouth daily.   Yes Historical Provider, MD  diazepam (VALIUM) 5 MG tablet Take 1 tablet (5 mg total) by mouth every 6 (six) hours as needed (spasms). Patient not taking: Reported on 02/04/2015 05/05/14   Mirian MoMatthew Gentry, MD  diclofenac sodium (VOLTAREN) 1 % GEL Apply 2 g topically 4 (four) times daily. To left elbow joint Patient not taking: Reported on 02/04/2015 07/24/14   Antony MaduraKelly Humes, PA-C  meloxicam (MOBIC) 7.5 MG tablet Take 2 tablets (15 mg total) by mouth daily. Patient not taking: Reported on 02/04/2015 07/24/14   Antony MaduraKelly Humes, PA-C  naproxen (NAPROSYN)  500 MG tablet Take 1 tablet (500 mg total) by mouth 2 (two) times daily. Patient not taking: Reported on 02/04/2015 01/27/14   Antony Madura, PA-C  traMADol (ULTRAM) 50 MG tablet Take 1 tablet (50 mg total) by mouth every 6 (six) hours as needed. Patient not taking: Reported on 02/04/2015 01/27/14   Antony Madura, PA-C   Triage VItals: BP 143/96 mmHg  Pulse 121  Temp(Src) 98.3 F (36.8 C) (Oral)  Resp 22  Ht  (1.549 m)  Wt 308 lb 9 oz (139.963 kg)  BMI 58.33 kg/m2  SpO2 95%   Physical Exam  Constitutional: She is oriented to person, place, and time. She appears well-developed and well-nourished. No distress.  HENT:  Head: Normocephalic and atraumatic.   Right Ear: Tympanic membrane normal.  Left Ear: Tympanic membrane normal.  Nose: Rhinorrhea present.  Mouth/Throat: Oropharynx is clear and moist. No oropharyngeal exudate, posterior oropharyngeal edema or posterior oropharyngeal erythema.  Eyes: Conjunctivae and EOM are normal.  Neck: Neck supple. No tracheal deviation present.  Cardiovascular: Normal rate and regular rhythm.   Pulmonary/Chest: Effort normal. No respiratory distress. She has no wheezes. She has no rhonchi. She has no rales.  Musculoskeletal: Normal range of motion.  Neurological: She is alert and oriented to person, place, and time.  Skin: Skin is warm and dry.  Psychiatric: She has a normal mood and affect. Her behavior is normal.  Nursing note and vitals reviewed.   ED Course  Procedures (including critical care time)  DIAGNOSTIC STUDIES: Oxygen Saturation is 95% on RA, adequate by my interpretation.    COORDINATION OF CARE: 10:07 PM-Discussed treatment plan which includes rest, Flonase, and OTC fever reducer with pt at bedside and pt agreed to plan.   Labs Review Labs Reviewed - No data to display  Imaging Review No results found.   EKG Interpretation None      MDM   Final diagnoses:  Viral upper respiratory infection   Labs: None  Imaging: None  Consults: None  Therapeutics: None  Discharge Meds:   Assessment/Plan: Patient presentation most consistent with viral respiratory infection. Patient is afebrile, nontoxic-appearing, in no acute distress. Vital signs reassuring. Patient has no significant signs of infection on exam, she does have rhinorrhea and cough. Patient will be discharged home with instructions to use symptomatic therapies including ibuprofen and Tylenol, cough medication, follow up with primary care in 3 days for reevaluation. Patient verbalized understanding and agreement for today's plan, she was given strict return precautions.     I personally performed the services  described in this documentation, which was scribed in my presence. The recorded information has been reviewed and is accurate.      Eyvonne Mechanic, PA-C 02/04/15 2220  Bethann Berkshire, MD 02/04/15 2252

## 2015-02-04 NOTE — ED Notes (Signed)
Patient is alert and orientedx4.  Patient was explained discharge instructions and they understood them with no questions.   

## 2015-02-04 NOTE — ED Notes (Signed)
Pt reports productive cough w/ yellow sputum, nasal congestion, facial pain and rt earache that began yesterday. Pt admits to low grade fever at home. Pt has been taking robitussin and tylenol at home.

## 2015-04-04 ENCOUNTER — Emergency Department (HOSPITAL_BASED_OUTPATIENT_CLINIC_OR_DEPARTMENT_OTHER)
Admission: EM | Admit: 2015-04-04 | Discharge: 2015-04-05 | Disposition: A | Discharge status: Home / Self Care | Payer: 59 | Attending: Emergency Medicine | Admitting: Emergency Medicine

## 2015-04-04 ENCOUNTER — Encounter (HOSPITAL_BASED_OUTPATIENT_CLINIC_OR_DEPARTMENT_OTHER): Payer: Self-pay | Admitting: *Deleted

## 2015-04-04 ENCOUNTER — Emergency Department (HOSPITAL_BASED_OUTPATIENT_CLINIC_OR_DEPARTMENT_OTHER): Payer: 59

## 2015-04-04 DIAGNOSIS — Z79899 Other long term (current) drug therapy: Secondary | ICD-10-CM | POA: Diagnosis not present

## 2015-04-04 DIAGNOSIS — R0781 Pleurodynia: Secondary | ICD-10-CM | POA: Diagnosis not present

## 2015-04-04 DIAGNOSIS — E119 Type 2 diabetes mellitus without complications: Secondary | ICD-10-CM | POA: Insufficient documentation

## 2015-04-04 DIAGNOSIS — Z794 Long term (current) use of insulin: Secondary | ICD-10-CM | POA: Insufficient documentation

## 2015-04-04 DIAGNOSIS — J208 Acute bronchitis due to other specified organisms: Secondary | ICD-10-CM

## 2015-04-04 DIAGNOSIS — K219 Gastro-esophageal reflux disease without esophagitis: Secondary | ICD-10-CM | POA: Diagnosis not present

## 2015-04-04 DIAGNOSIS — R05 Cough: Secondary | ICD-10-CM | POA: Diagnosis present

## 2015-04-04 DIAGNOSIS — J45902 Unspecified asthma with status asthmaticus: Secondary | ICD-10-CM | POA: Insufficient documentation

## 2015-04-04 DIAGNOSIS — Z7984 Long term (current) use of oral hypoglycemic drugs: Secondary | ICD-10-CM | POA: Insufficient documentation

## 2015-04-04 DIAGNOSIS — Z87891 Personal history of nicotine dependence: Secondary | ICD-10-CM | POA: Insufficient documentation

## 2015-04-04 MED ORDER — AEROCHAMBER PLUS W/MASK MISC
1.0000 | Freq: Once | Status: AC
  Administered 2015-04-05: 1
  Filled 2015-04-04: qty 1

## 2015-04-04 MED ORDER — ALBUTEROL SULFATE HFA 108 (90 BASE) MCG/ACT IN AERS
2.0000 | INHALATION_SPRAY | RESPIRATORY_TRACT | Status: DC | PRN
  Administered 2015-04-05: 2 via RESPIRATORY_TRACT
  Filled 2015-04-04 (×2): qty 6.7

## 2015-04-04 MED ORDER — HYDROCODONE-ACETAMINOPHEN 5-325 MG PO TABS
1.0000 | ORAL_TABLET | Freq: Once | ORAL | Status: AC
  Administered 2015-04-05: 1 via ORAL
  Filled 2015-04-04: qty 1

## 2015-04-04 NOTE — ED Notes (Signed)
Pt reports unproductive cough since last night; began having L rib pain today; denies sob, chest pain, n/v/d; reports fever of 100.9. Pt also reports burning sensation on R flank -- no redness noted at this time.

## 2015-04-04 NOTE — ED Provider Notes (Signed)
CSN: [id number]     Arrival date & time 04/04/15  2209 History  By signing my name below, I, Terrance Branch, attest that this documentation has been prepared under the direction and in the presence of Paula LibraJohn Lyan Holck, MD. Electronically Signed: Evon Slackerrance Branch, ED Scribe. 04/05/2015. 12:02 AM.    Chief Complaint  Patient presents with  . Cough   The history is provided by the patient. No language interpreter was used.   HPI Comments: [name] is a 44 y.o. female who presents to the Emergency Department complaining of unproductive cough onset 1 night prior. Pt reports associated sharp pain across her left lower chest wall that is worse with coughing or deep breathing. Pt rates the severity of her pain 8 out of 10. Pt reports fever max temp 100.9 PTA, chills, sore throat, rhinorrhea and diarrhea. Pt does not report any alleviating factors. Pt denies nausea, vomiting or abdominal pain.  Past Medical History  Diagnosis Date  . Diabetes mellitus   . GERD (gastroesophageal reflux disease)   . Cellulitis 07/2011   Past Surgical History  Procedure Laterality Date  . Cholecystectomy    . Knee arthroscopy    . Laparoscopic tubal ligation  04/29/2011    Procedure: LAPAROSCOPIC TUBAL LIGATION;  Surgeon: Turner Danielsavid C Lowe, MD;  Location: [facility];  Service: Gynecology;  Laterality: Bilateral;  with Filshie Clips  . Ablation    . Rotator cuff repair     Family History  Problem Relation Age of Onset  . Cancer Mother   . Diabetes Mother   . Hypertension Mother    Social History  Substance Use Topics  . Smoking status: Former Smoker -- 0.50 packs/day for 2 years    Types: Cigarettes    Quit date: 07/22/2011  . Smokeless tobacco: Never Used  . Alcohol Use: Yes     Comment: occasional   OB History    No data available     Review of Systems A complete 10 system review of systems was obtained and all systems are negative except as noted in the HPI and PMH.     Allergies  Vancomycin and  Adhesive  Home Medications   Prior to Admission medications   Medication Sig Start Date End Date Taking? Authorizing Provider  furosemide (LASIX) 20 MG tablet Take 1 tablet (20 mg total) by mouth daily. 05/14/13   Doris Cheadleeepak Advani, MD  HYDROcodone-acetaminophen (NORCO/VICODIN) 5-325 MG tablet Take 1 tablet by mouth every 4 (four) hours as needed (for pain or cough). 04/05/15   Jacobe Study, MD  insulin NPH-regular (NOVOLIN 70/30) (70-30) 100 UNIT/ML injection Inject 13 Units into the skin 2 (two) times daily with a meal. 04/08/13   Nishant Dhungel, MD  metFORMIN (GLUCOPHAGE) 500 MG tablet Take 2 tablets (1,000 mg total) by mouth 2 (two) times daily with a meal. 04/08/13   Nishant Dhungel, MD  omeprazole (PRILOSEC OTC) 20 MG tablet Take 20 mg by mouth daily.    Historical Provider, MD   BP 112/48 mmHg  Pulse 109  Temp(Src) 98.1 F (36.7 C) (Oral)  Resp 16  Ht 5\' 1"  (1.549 m)  Wt 330 lb (149.687 kg)  BMI 62.39 kg/m2  SpO2 95%   Physical Exam General: Well-developed, well-nourished female in no acute distress; appearance consistent with age of record HENT: normocephalic; atraumatic; pharynx normal  Eyes: pupils equal, round and reactive to light; extraocular muscles intact Neck: supple Heart: regular rate and rhythm; Chest: Pain in left lower rib at about the  costochondral junction Lungs: clear to auscultation bilaterally Abdomen: soft; nondistended; nontender; bowel sounds present Extremities: No deformity; full range of motion; pulses normal Neurologic: Awake, alert and oriented; motor function intact in all extremities and symmetric; no facial droop Skin: Warm and dry Psychiatric: Normal mood and affect   ED Course  Procedures (including critical care time) DIAGNOSTIC STUDIES: Oxygen Saturation is 98% on RA, normal by my interpretation.    COORDINATION OF CARE: 12:01 AM-Discussed treatment plan with pt at bedside and pt agreed to plan.    MDM  Nursing notes and vitals signs,  including pulse oximetry, reviewed.  Summary of this visit's results, reviewed by myself:  Imaging Studies: Dg Chest 2 View  04/04/2015  CLINICAL DATA:  Cough since last night, left rib pain today, fever. EXAM: CHEST  2 VIEW COMPARISON:  None. FINDINGS: The heart size and mediastinal contours are within normal limits. Both lungs are clear. The visualized skeletal structures are unremarkable. IMPRESSION: No evidence of acute cardiopulmonary disease. No evidence of pneumonia seen. Electronically Signed   By: Bary Richard M.D.   On: 04/04/2015 23:45      Final diagnoses:  Acute viral bronchitis  Rib pain on left side    I personally performed the services described in this documentation, which was scribed in my presence. The recorded information has been reviewed and is accurate.      Paula Libra, MD 04/05/15 0005

## 2015-04-05 MED ORDER — HYDROCODONE-ACETAMINOPHEN 5-325 MG PO TABS: 1.0000 | ORAL_TABLET | ORAL | Status: DC | PRN

## 2015-05-04 ENCOUNTER — Emergency Department (HOSPITAL_BASED_OUTPATIENT_CLINIC_OR_DEPARTMENT_OTHER): Payer: BLUE CROSS/BLUE SHIELD

## 2015-05-04 ENCOUNTER — Emergency Department (HOSPITAL_BASED_OUTPATIENT_CLINIC_OR_DEPARTMENT_OTHER)
Admission: EM | Admit: 2015-05-04 | Discharge: 2015-05-04 | Disposition: A | Discharge status: Home / Self Care | Payer: BLUE CROSS/BLUE SHIELD | Attending: Emergency Medicine | Admitting: Emergency Medicine

## 2015-05-04 ENCOUNTER — Encounter (HOSPITAL_BASED_OUTPATIENT_CLINIC_OR_DEPARTMENT_OTHER): Payer: Self-pay | Admitting: *Deleted

## 2015-05-04 DIAGNOSIS — K219 Gastro-esophageal reflux disease without esophagitis: Secondary | ICD-10-CM | POA: Diagnosis not present

## 2015-05-04 DIAGNOSIS — Z794 Long term (current) use of insulin: Secondary | ICD-10-CM | POA: Insufficient documentation

## 2015-05-04 DIAGNOSIS — Z872 Personal history of diseases of the skin and subcutaneous tissue: Secondary | ICD-10-CM | POA: Insufficient documentation

## 2015-05-04 DIAGNOSIS — Y9389 Activity, other specified: Secondary | ICD-10-CM | POA: Insufficient documentation

## 2015-05-04 DIAGNOSIS — Z79899 Other long term (current) drug therapy: Secondary | ICD-10-CM | POA: Diagnosis not present

## 2015-05-04 DIAGNOSIS — W231XXA Caught, crushed, jammed, or pinched between stationary objects, initial encounter: Secondary | ICD-10-CM | POA: Insufficient documentation

## 2015-05-04 DIAGNOSIS — Y9289 Other specified places as the place of occurrence of the external cause: Secondary | ICD-10-CM | POA: Insufficient documentation

## 2015-05-04 DIAGNOSIS — S6991XA Unspecified injury of right wrist, hand and finger(s), initial encounter: Secondary | ICD-10-CM | POA: Insufficient documentation

## 2015-05-04 DIAGNOSIS — E119 Type 2 diabetes mellitus without complications: Secondary | ICD-10-CM | POA: Insufficient documentation

## 2015-05-04 DIAGNOSIS — Y998 Other external cause status: Secondary | ICD-10-CM | POA: Insufficient documentation

## 2015-05-04 DIAGNOSIS — M79644 Pain in right finger(s): Secondary | ICD-10-CM

## 2015-05-04 DIAGNOSIS — Z87891 Personal history of nicotine dependence: Secondary | ICD-10-CM | POA: Diagnosis not present

## 2015-05-04 MED ORDER — NAPROXEN 500 MG PO TABS: 500.0000 mg | ORAL_TABLET | Freq: Two times a day (BID) | ORAL | Status: DC | PRN

## 2015-05-04 NOTE — ED Provider Notes (Signed)
CSN: [id number]     Arrival date & time 05/04/15  1810 History   First MD Initiated Contact with Patient 05/04/15 1936     Chief Complaint  Patient presents with  . Hand Injury     (Consider location/radiation/quality/duration/timing/severity/associated sxs/prior Treatment) HPI   Pt presents with right middle finger pain following getting her finger caught on her steering wheel while making a quick cross-body movement with her arm.  States she has had pain in the base of that finger since.  Pain is throbbing, worse with use.  Has used tylenol without improvement.  She is left handed.    Past Medical History  Diagnosis Date  . Diabetes mellitus   . GERD (gastroesophageal reflux disease)   . Cellulitis 07/2011   Past Surgical History  Procedure Laterality Date  . Cholecystectomy    . Knee arthroscopy    . Laparoscopic tubal ligation  04/29/2011    Procedure: LAPAROSCOPIC TUBAL LIGATION;  Surgeon: Turner Danielsavid C Lowe, MD;  Location: [facility];  Service: Gynecology;  Laterality: Bilateral;  with Filshie Clips  . Ablation    . Rotator cuff repair     Family History  Problem Relation Age of Onset  . Cancer Mother   . Diabetes Mother   . Hypertension Mother    Social History  Substance Use Topics  . Smoking status: Former Smoker -- 0.50 packs/day for 2 years    Types: Cigarettes    Quit date: 07/22/2011  . Smokeless tobacco: Never Used  . Alcohol Use: Yes     Comment: occasional   OB History    No data available     Review of Systems  Constitutional: Negative for fever.  Musculoskeletal: Positive for arthralgias.  Skin: Negative for color change and wound.  Allergic/Immunologic: Negative for immunocompromised state.  Neurological: Negative for weakness and numbness.  Hematological: Does not bruise/bleed easily.  Psychiatric/Behavioral: Negative for self-injury (accidental).      Allergies  Vancomycin and Adhesive  Home Medications   Prior to Admission medications     Medication Sig Start Date End Date Taking? Authorizing Provider  furosemide (LASIX) 20 MG tablet Take 1 tablet (20 mg total) by mouth daily. 05/14/13   Doris Cheadleeepak Advani, MD  HYDROcodone-acetaminophen (NORCO/VICODIN) 5-325 MG tablet Take 1 tablet by mouth every 4 (four) hours as needed (for pain or cough). 04/05/15   John Molpus, MD  insulin NPH-regular (NOVOLIN 70/30) (70-30) 100 UNIT/ML injection Inject 13 Units into the skin 2 (two) times daily with a meal. 04/08/13   Nishant Dhungel, MD  metFORMIN (GLUCOPHAGE) 500 MG tablet Take 2 tablets (1,000 mg total) by mouth 2 (two) times daily with a meal. 04/08/13   Nishant Dhungel, MD  naproxen (NAPROSYN) 500 MG tablet Take 1 tablet (500 mg total) by mouth 2 (two) times daily as needed for mild pain or moderate pain. 05/04/15   Trixie DredgeEmily Journi Moffa, PA-C  omeprazole (PRILOSEC OTC) 20 MG tablet Take 20 mg by mouth daily.    Historical Provider, MD   BP 121/60 mmHg  Pulse 98  Temp(Src) 98 F (36.7 C) (Oral)  Resp 18  Ht 5\' 1"  (1.549 m)  Wt 152.409 kg  BMI 63.52 kg/m2  SpO2 100% Physical Exam  Constitutional: She appears well-developed and well-nourished. No distress.  HENT:  Head: Normocephalic and atraumatic.  Neck: Neck supple.  Pulmonary/Chest: Effort normal.  Musculoskeletal:       Hands: Right hand: Full active range of motion of all digits, strength 5/5, sensation intact, capillary  refill < 2 seconds.  NO skin discoloration or break in skin.  No erythema, edema, warmth.    Neurological: She is alert.  Skin: She is not diaphoretic.  Nursing note and vitals reviewed.   ED Course  Procedures (including critical care time) Labs Review Labs Reviewed - No data to display  Imaging Review Dg Hand Complete Right  05/04/2015  CLINICAL DATA:  45 year old female with hyperextension injury to the right middle finger several days ago. EXAM: RIGHT HAND - COMPLETE 3+ VIEW COMPARISON:  No priors. FINDINGS: Multiple views of the right hand demonstrate no acute  displaced fracture, subluxation, dislocation, or soft tissue abnormality. IMPRESSION: No acute radiographic abnormality of the right hand. Electronically Signed   By: Trudie Reed M.D.   On: 05/04/2015 18:51   I have personally reviewed and evaluated these images and lab results as part of my medical decision-making.   EKG Interpretation None      MDM   Final diagnoses:  Finger pain, right   Afebrile, nontoxic patient with injury to her right middle finger while reach across her body, catching it on a steering wheel.  Neurovascularly intact.  Likely sprained.   Xray negative.   D/C home with naprosyn, ace wrap, PCP follow up.  Discussed result, findings, treatment, and follow up  with patient.  Pt given return precautions.  Pt verbalizes understanding and agrees with plan.      Trixie Dredge, PA-C 05/04/15 2021  Rolan Bucco, MD 05/04/15 2130

## 2015-05-04 NOTE — Discharge Instructions (Signed)
Read the information below.  Use the prescribed medication as directed.  Please discuss all new medications with your pharmacist.  You may return to the Emergency Department at any time for worsening condition or any new symptoms that concern you.    If you develop uncontrolled pain, weakness or numbness of the extremity, severe discoloration of the skin, or you are unable to move your finger, return to the ER for a recheck.    °

## 2015-05-04 NOTE — ED Notes (Signed)
Pt verbalizes understanding of d/c instructions and denies any further need at this time. 

## 2015-05-04 NOTE — ED Notes (Signed)
Pain in her right middle finger . Her hand was caught in the steering wheel.

## 2015-05-04 NOTE — ED Notes (Signed)
Right middle finger pain for a week after catching on a steering wheel, no swelling or trauma noted, full ROM

## 2015-05-14 ENCOUNTER — Encounter: Payer: Self-pay | Admitting: Family Medicine

## 2015-05-14 ENCOUNTER — Ambulatory Visit (INDEPENDENT_AMBULATORY_CARE_PROVIDER_SITE_OTHER): Payer: BLUE CROSS/BLUE SHIELD | Admitting: Family Medicine

## 2015-05-14 VITALS — BP 145/88 | HR 103 | Ht 61.0 in | Wt 336.0 lb

## 2015-05-14 DIAGNOSIS — S6991XA Unspecified injury of right wrist, hand and finger(s), initial encounter: Secondary | ICD-10-CM | POA: Diagnosis not present

## 2015-05-14 NOTE — Patient Instructions (Signed)
You have strained and sprained your middle finger, bruised the distal ulna (bone at the wrist). Consider wrist brace and/or buddy taping for comfort. Icing 15 minutes at a time 3-4 times a day. Ibuprofen  three times a day with food OR aleve 2 tabs twice a day with food for pain and inflammation as needed. Follow up with me in 4 weeks if needed.

## 2015-05-18 NOTE — Progress Notes (Signed)
PCP: COUSINS, MELISSA A, FNP  Subjective:   HPI: Patient is a 45 y.o. female here for right hand injury.  Patient reports 2 weeks ago she accidentally caught her right 3rd digit in the steering wheel cover, bent her 3rd finger backwards. Pain level 7/10 still mainly about PIP joint, sharp. No prior injuries. + swelling. Radiates up to wrist. No skin changes, fever, other complaints.  Past Medical History  Diagnosis Date  . Diabetes mellitus   . GERD (gastroesophageal reflux disease)   . Cellulitis 07/2011    Current Outpatient Prescriptions on File Prior to Visit  Medication Sig Dispense Refill  . furosemide (LASIX) 20 MG tablet Take 1 tablet (20 mg total) by mouth daily. 30 tablet 0  . HYDROcodone-acetaminophen (NORCO/VICODIN) 5-325 MG tablet Take 1 tablet by mouth every 4 (four) hours as needed (for pain or cough). 20 tablet 0  . insulin NPH-regular (NOVOLIN 70/30) (70-30) 100 UNIT/ML injection Inject 13 Units into the skin 2 (two) times daily with a meal. 10 mL 12  . metFORMIN (GLUCOPHAGE) 500 MG tablet Take 2 tablets (1,000 mg total) by mouth 2 (two) times daily with a meal. 60 tablet 2  . naproxen (NAPROSYN) 500 MG tablet Take 1 tablet (500 mg total) by mouth 2 (two) times daily as needed for mild pain or moderate pain. 15 tablet 0  . omeprazole (PRILOSEC OTC) 20 MG tablet Take 20 mg by mouth daily.     No current facility-administered medications on file prior to visit.    Past Surgical History  Procedure Laterality Date  . Cholecystectomy    . Knee arthroscopy    . Laparoscopic tubal ligation  04/29/2011    Procedure: LAPAROSCOPIC TUBAL LIGATION;  Surgeon: Turner Daniels, MD;  Location: [facility];  Service: Gynecology;  Laterality: Bilateral;  with Filshie Clips  . Ablation    . Rotator cuff repair      Allergies  Allergen Reactions  . Vancomycin     Red man syndrome  . Adhesive [Tape] Itching and Rash    Social History   Social History  . Marital Status: Married     Spouse Name: N/A  . Number of Children: N/A  . Years of Education: N/A   Occupational History  . Not on file.   Social History Main Topics  . Smoking status: Former Smoker -- 0.50 packs/day for 2 years    Types: Cigarettes    Quit date: 07/22/2011  . Smokeless tobacco: Never Used  . Alcohol Use: 0.0 oz/week    0 Standard drinks or equivalent per week     Comment: occasional  . Drug Use: No  . Sexual Activity: Yes    Birth Control/ Protection: Surgical   Other Topics Concern  . Not on file   Social History Narrative    Family History  Problem Relation Age of Onset  . Cancer Mother   . Diabetes Mother   . Hypertension Mother     BP 145/88 mmHg  Pulse 103  Ht  (1.549 m)  Wt 336 lb (152.409 kg)  BMI 63.52 kg/m2  Review of Systems: See HPI above.    Objective:  Physical Exam:  Gen: NAD  Right hand: Mild swelling 3rd digit mainly about PIP joint.  No malrotation, angulation, other deformity. TTP circumferentially 3rd PIP.  Mild tenderness distal ulna.  No other tenderness. Full extension, 90 degrees flexion at 3rd PIP.  FROM other digits and joints. 5/5 strength flexion and extension all joints.  Collateral ligaments intact. NVI distally.  Left hand: FROM digits without pain.    Assessment & Plan:  1. Right finger sprain, distal ulna contusion - independently reviewed radiographs and no evidence fracture.  Tendons intact, no collateral ligament laxity.  Reassured patient.  Discussed wrist brace, shown how to buddy tape digits.  Icing, nsaids.  Expect significant improvement over the next 2-3 weeks.  F/u in 4 weeks if not improving as expected.

## 2015-05-19 ENCOUNTER — Telehealth: Payer: Self-pay | Admitting: Family Medicine

## 2015-05-19 DIAGNOSIS — S6991XA Unspecified injury of right wrist, hand and finger(s), initial encounter: Secondary | ICD-10-CM | POA: Insufficient documentation

## 2015-05-19 NOTE — Assessment & Plan Note (Signed)
Right finger sprain, distal ulna contusion - independently reviewed radiographs and no evidence fracture.  Tendons intact, no collateral ligament laxity.  Reassured patient.  Discussed wrist brace, shown how to buddy tape digits.  Icing, nsaids.  Expect significant improvement over the next 2-3 weeks.  F/u in 4 weeks if not improving as expected.

## 2015-05-19 NOTE — Telephone Encounter (Signed)
If she can't find a type of tape that doesn't irritate her it's ok to go without anything and it should still heal fine.

## 2015-05-19 NOTE — Telephone Encounter (Signed)
Spoke to patient and gave her information provided by physician. 

## 2015-06-04 ENCOUNTER — Encounter (HOSPITAL_BASED_OUTPATIENT_CLINIC_OR_DEPARTMENT_OTHER): Payer: Self-pay | Admitting: Emergency Medicine

## 2015-06-04 ENCOUNTER — Emergency Department (HOSPITAL_BASED_OUTPATIENT_CLINIC_OR_DEPARTMENT_OTHER)
Admission: EM | Admit: 2015-06-04 | Discharge: 2015-06-05 | Disposition: A | Discharge status: Home / Self Care | Payer: BLUE CROSS/BLUE SHIELD | Attending: Emergency Medicine | Admitting: Emergency Medicine

## 2015-06-04 DIAGNOSIS — Z7984 Long term (current) use of oral hypoglycemic drugs: Secondary | ICD-10-CM | POA: Diagnosis not present

## 2015-06-04 DIAGNOSIS — Z87891 Personal history of nicotine dependence: Secondary | ICD-10-CM | POA: Insufficient documentation

## 2015-06-04 DIAGNOSIS — K5900 Constipation, unspecified: Secondary | ICD-10-CM | POA: Diagnosis not present

## 2015-06-04 DIAGNOSIS — Z3202 Encounter for pregnancy test, result negative: Secondary | ICD-10-CM | POA: Insufficient documentation

## 2015-06-04 DIAGNOSIS — Z79899 Other long term (current) drug therapy: Secondary | ICD-10-CM | POA: Diagnosis not present

## 2015-06-04 DIAGNOSIS — E119 Type 2 diabetes mellitus without complications: Secondary | ICD-10-CM | POA: Insufficient documentation

## 2015-06-04 DIAGNOSIS — Z872 Personal history of diseases of the skin and subcutaneous tissue: Secondary | ICD-10-CM | POA: Diagnosis not present

## 2015-06-04 DIAGNOSIS — R1030 Lower abdominal pain, unspecified: Secondary | ICD-10-CM | POA: Diagnosis present

## 2015-06-04 DIAGNOSIS — Z794 Long term (current) use of insulin: Secondary | ICD-10-CM | POA: Insufficient documentation

## 2015-06-04 DIAGNOSIS — K219 Gastro-esophageal reflux disease without esophagitis: Secondary | ICD-10-CM | POA: Diagnosis not present

## 2015-06-04 DIAGNOSIS — Z9851 Tubal ligation status: Secondary | ICD-10-CM | POA: Diagnosis not present

## 2015-06-04 DIAGNOSIS — R109 Unspecified abdominal pain: Secondary | ICD-10-CM

## 2015-06-04 LAB — URINALYSIS, ROUTINE W REFLEX MICROSCOPIC
Bilirubin Urine: NEGATIVE
Glucose, UA: >1000 mg/dL — AB
Ketones, ur: NEGATIVE mg/dL
Nitrite: NEGATIVE
Protein, ur: NEGATIVE mg/dL
Specific Gravity, Urine: 1.039 — ABNORMAL HIGH (ref 1.005–1.030)
pH: 5 (ref 5.0–8.0)

## 2015-06-04 LAB — URINE MICROSCOPIC-ADD ON
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
## 2015-06-05 ENCOUNTER — Emergency Department (HOSPITAL_BASED_OUTPATIENT_CLINIC_OR_DEPARTMENT_OTHER): Payer: BLUE CROSS/BLUE SHIELD

## 2015-06-05 ENCOUNTER — Encounter (HOSPITAL_BASED_OUTPATIENT_CLINIC_OR_DEPARTMENT_OTHER): Payer: Self-pay | Admitting: Emergency Medicine

## 2015-06-05 LAB — COMPREHENSIVE METABOLIC PANEL
ALT: 26 U/L (ref 14–54)
AST: 23 U/L (ref 15–41)
Albumin: 3.4 g/dL — ABNORMAL LOW (ref 3.5–5.0)
Alkaline Phosphatase: 115 U/L (ref 38–126)
Anion gap: 12 (ref 5–15)
BUN: 8 mg/dL (ref 6–20)
CO2: 24 mmol/L (ref 22–32)
Calcium: 8.9 mg/dL (ref 8.9–10.3)
Chloride: 98 mmol/L — ABNORMAL LOW (ref 101–111)
Creatinine, Ser: 0.57 mg/dL (ref 0.44–1.00)
GFR calc Af Amer: >60 mL/min (ref >60)
GFR calc non Af Amer: >60 mL/min (ref >60)
Glucose, Bld: 408 mg/dL — ABNORMAL HIGH (ref 65–99)
Potassium: 3.9 mmol/L (ref 3.5–5.1)
Sodium: 134 mmol/L — ABNORMAL LOW (ref 135–145)
Total Bilirubin: 0.7 mg/dL (ref 0.3–1.2)
Total Protein: 6.9 g/dL (ref 6.5–8.1)

## 2015-06-05 LAB — CBC
HCT: 41.2 % (ref 36.0–46.0)
Hemoglobin: 13.7 g/dL (ref 12.0–15.0)
MCH: 27.7 pg (ref 26.0–34.0)
MCHC: 33.3 g/dL (ref 30.0–36.0)
MCV: 83.4 fL (ref 78.0–100.0)
Platelets: 340 10*3/uL (ref 150–400)
RBC: 4.94 MIL/uL (ref 3.87–5.11)
RDW: 13.1 % (ref 11.5–15.5)
WBC: 11.3 10*3/uL — ABNORMAL HIGH (ref 4.0–10.5)

## 2015-06-05 LAB — PREGNANCY, URINE: Preg Test, Ur: NEGATIVE

## 2015-06-05 LAB — CBG MONITORING, ED: Glucose-Capillary: 307 mg/dL — ABNORMAL HIGH (ref 65–99)

## 2015-06-05 MED ORDER — POLYETHYLENE GLYCOL 3350 17 GM/SCOOP PO POWD: 17.0000 g | Freq: Every day | ORAL | Status: DC

## 2015-06-05 MED ORDER — DICYCLOMINE HCL 10 MG/ML IM SOLN
20.0000 mg | Freq: Once | INTRAMUSCULAR | Status: AC
  Administered 2015-06-05: 20 mg via INTRAMUSCULAR
  Filled 2015-06-05: qty 2

## 2015-06-05 MED ORDER — GI COCKTAIL ~~LOC~~
30.0000 mL | Freq: Once | ORAL | Status: AC
  Administered 2015-06-05: 30 mL via ORAL
  Filled 2015-06-05: qty 30

## 2015-06-05 MED ORDER — FLUCONAZOLE 50 MG PO TABS
150.0000 mg | ORAL_TABLET | Freq: Once | ORAL | Status: AC
  Administered 2015-06-05: 150 mg via ORAL
  Filled 2015-06-05: qty 1

## 2015-06-05 MED ORDER — METHOCARBAMOL 500 MG PO TABS
1000.0000 mg | ORAL_TABLET | Freq: Once | ORAL | Status: AC
  Administered 2015-06-05: 1000 mg via ORAL
  Filled 2015-06-05: qty 2

## 2015-06-05 MED ORDER — HYOSCYAMINE SULFATE 0.125 MG SL SUBL: 0.1250 mg | SUBLINGUAL_TABLET | SUBLINGUAL | Status: DC | PRN

## 2015-06-05 MED ORDER — INSULIN REGULAR HUMAN 100 UNIT/ML IJ SOLN
9.0000 [IU] | Freq: Once | INTRAMUSCULAR | Status: AC
  Administered 2015-06-05: 9 [IU] via SUBCUTANEOUS
  Filled 2015-06-05: qty 1

## 2015-06-05 MED ORDER — KETOROLAC TROMETHAMINE 30 MG/ML IJ SOLN
30.0000 mg | Freq: Once | INTRAMUSCULAR | Status: AC
Start: 2015-06-05 — End: 2015-06-05
  Administered 2015-06-05: 30 mg via INTRAVENOUS
  Filled 2015-06-05: qty 1

## 2015-06-05 MED ORDER — METHOCARBAMOL 500 MG PO TABS: 500.0000 mg | ORAL_TABLET | Freq: Two times a day (BID) | ORAL | Status: DC

## 2015-06-05 MED ORDER — DICYCLOMINE HCL 10 MG/ML IM SOLN: 20.0000 mg | Freq: Once | INTRAMUSCULAR | Status: DC

## 2015-06-05 NOTE — ED Notes (Signed)
CBG 307 

## 2015-06-05 NOTE — ED Notes (Signed)
Diet Coke given for PO challenge.
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
## 2015-06-06 ENCOUNTER — Other Ambulatory Visit (HOSPITAL_BASED_OUTPATIENT_CLINIC_OR_DEPARTMENT_OTHER): Payer: Self-pay | Admitting: Nurse Practitioner

## 2015-06-06 DIAGNOSIS — Z1231 Encounter for screening mammogram for malignant neoplasm of breast: Secondary | ICD-10-CM

## 2015-06-08 ENCOUNTER — Encounter (HOSPITAL_COMMUNITY): Payer: Self-pay | Admitting: Emergency Medicine

## 2015-06-08 ENCOUNTER — Emergency Department (HOSPITAL_COMMUNITY)
Admission: EM | Admit: 2015-06-08 | Discharge: 2015-06-08 | Disposition: A | Discharge status: Home / Self Care | Payer: BLUE CROSS/BLUE SHIELD | Attending: Emergency Medicine | Admitting: Emergency Medicine

## 2015-06-08 DIAGNOSIS — K219 Gastro-esophageal reflux disease without esophagitis: Secondary | ICD-10-CM | POA: Insufficient documentation

## 2015-06-08 DIAGNOSIS — R000 Tachycardia, unspecified: Secondary | ICD-10-CM | POA: Insufficient documentation

## 2015-06-08 DIAGNOSIS — Z7984 Long term (current) use of oral hypoglycemic drugs: Secondary | ICD-10-CM | POA: Insufficient documentation

## 2015-06-08 DIAGNOSIS — E119 Type 2 diabetes mellitus without complications: Secondary | ICD-10-CM | POA: Insufficient documentation

## 2015-06-08 DIAGNOSIS — Z87891 Personal history of nicotine dependence: Secondary | ICD-10-CM | POA: Diagnosis not present

## 2015-06-08 DIAGNOSIS — R509 Fever, unspecified: Secondary | ICD-10-CM | POA: Diagnosis not present

## 2015-06-08 DIAGNOSIS — R1110 Vomiting, unspecified: Secondary | ICD-10-CM | POA: Diagnosis not present

## 2015-06-08 DIAGNOSIS — G8929 Other chronic pain: Secondary | ICD-10-CM

## 2015-06-08 DIAGNOSIS — Z3202 Encounter for pregnancy test, result negative: Secondary | ICD-10-CM | POA: Diagnosis not present

## 2015-06-08 DIAGNOSIS — R1031 Right lower quadrant pain: Secondary | ICD-10-CM | POA: Diagnosis not present

## 2015-06-08 DIAGNOSIS — Z79899 Other long term (current) drug therapy: Secondary | ICD-10-CM | POA: Diagnosis not present

## 2015-06-08 DIAGNOSIS — Z794 Long term (current) use of insulin: Secondary | ICD-10-CM | POA: Insufficient documentation

## 2015-06-08 DIAGNOSIS — R109 Unspecified abdominal pain: Secondary | ICD-10-CM

## 2015-06-08 DIAGNOSIS — R197 Diarrhea, unspecified: Secondary | ICD-10-CM | POA: Insufficient documentation

## 2015-06-08 DIAGNOSIS — Z872 Personal history of diseases of the skin and subcutaneous tissue: Secondary | ICD-10-CM | POA: Insufficient documentation

## 2015-06-08 LAB — CBC
HCT: 40.6 % (ref 36.0–46.0)
Hemoglobin: 13.2 g/dL (ref 12.0–15.0)
MCH: 27.7 pg (ref 26.0–34.0)
MCHC: 32.5 g/dL (ref 30.0–36.0)
MCV: 85.3 fL (ref 78.0–100.0)
Platelets: 359 10*3/uL (ref 150–400)
RBC: 4.76 MIL/uL (ref 3.87–5.11)
RDW: 13 % (ref 11.5–15.5)
WBC: 11.1 10*3/uL — ABNORMAL HIGH (ref 4.0–10.5)

## 2015-06-08 LAB — COMPREHENSIVE METABOLIC PANEL
ALT: 24 U/L (ref 14–54)
AST: 23 U/L (ref 15–41)
Albumin: 3.5 g/dL (ref 3.5–5.0)
Alkaline Phosphatase: 104 U/L (ref 38–126)
Anion gap: 9 (ref 5–15)
BUN: 9 mg/dL (ref 6–20)
CO2: 24 mmol/L (ref 22–32)
Calcium: 8.9 mg/dL (ref 8.9–10.3)
Chloride: 98 mmol/L — ABNORMAL LOW (ref 101–111)
Creatinine, Ser: 0.73 mg/dL (ref 0.44–1.00)
GFR calc Af Amer: >60 mL/min (ref >60)
GFR calc non Af Amer: >60 mL/min (ref >60)
Glucose, Bld: 500 mg/dL — ABNORMAL HIGH (ref 65–99)
Potassium: 4.1 mmol/L (ref 3.5–5.1)
Sodium: 131 mmol/L — ABNORMAL LOW (ref 135–145)
Total Bilirubin: 0.5 mg/dL (ref 0.3–1.2)
Total Protein: 7 g/dL (ref 6.5–8.1)

## 2015-06-08 LAB — URINALYSIS, ROUTINE W REFLEX MICROSCOPIC
Bilirubin Urine: NEGATIVE
Glucose, UA: >1000 mg/dL — AB
Hgb urine dipstick: NEGATIVE
Ketones, ur: NEGATIVE mg/dL
Leukocytes, UA: NEGATIVE
Nitrite: NEGATIVE
Protein, ur: NEGATIVE mg/dL
Specific Gravity, Urine: 1.027 (ref 1.005–1.030)
pH: 5 (ref 5.0–8.0)

## 2015-06-08 LAB — URINE MICROSCOPIC-ADD ON

## 2015-06-08 LAB — LIPASE, BLOOD: Lipase: 50 U/L (ref 11–51)

## 2015-06-08 LAB — I-STAT BETA HCG BLOOD, ED (MC, WL, AP ONLY): I-stat hCG, quantitative: <5 m[IU]/mL (ref <5)

## 2015-06-08 LAB — CBG MONITORING, ED: Glucose-Capillary: 187 mg/dL — ABNORMAL HIGH (ref 65–99)

## 2015-06-08 MED ORDER — DICYCLOMINE HCL 20 MG PO TABS: 20.0000 mg | ORAL_TABLET | Freq: Two times a day (BID) | ORAL | Status: DC

## 2015-06-08 MED ORDER — KETOROLAC TROMETHAMINE 30 MG/ML IJ SOLN
30.0000 mg | Freq: Once | INTRAMUSCULAR | Status: AC
Start: 2015-06-08 — End: 2015-06-08
  Administered 2015-06-08: 30 mg via INTRAVENOUS
  Filled 2015-06-08: qty 1

## 2015-06-08 MED ORDER — DICYCLOMINE HCL 10 MG/ML IM SOLN
20.0000 mg | Freq: Once | INTRAMUSCULAR | Status: AC
  Administered 2015-06-08: 20 mg via INTRAMUSCULAR
  Filled 2015-06-08: qty 2

## 2015-06-08 MED ORDER — ONDANSETRON HCL 4 MG/2ML IJ SOLN: 4.0000 mg | Freq: Once | INTRAMUSCULAR | Status: DC

## 2015-06-08 MED ORDER — METOCLOPRAMIDE HCL 5 MG/ML IJ SOLN
10.0000 mg | INTRAMUSCULAR | Status: AC
  Administered 2015-06-08: 10 mg via INTRAVENOUS
  Filled 2015-06-08: qty 2

## 2015-06-08 MED ORDER — POLYETHYLENE GLYCOL 3350 17 GM/SCOOP PO POWD: 17.0000 g | Freq: Every day | ORAL | Status: DC

## 2015-06-08 MED ORDER — SODIUM CHLORIDE 0.9 % IV BOLUS (SEPSIS)
1000.0000 mL | Freq: Once | INTRAVENOUS | Status: AC
  Administered 2015-06-08: 1000 mL via INTRAVENOUS

## 2015-06-08 MED ORDER — INSULIN ASPART 100 UNIT/ML ~~LOC~~ SOLN
15.0000 [IU] | Freq: Once | SUBCUTANEOUS | Status: AC
  Administered 2015-06-08: 15 [IU] via INTRAVENOUS
  Filled 2015-06-08: qty 1

## 2015-06-08 MED ORDER — TRAMADOL HCL 50 MG PO TABS: 50.0000 mg | ORAL_TABLET | Freq: Four times a day (QID) | ORAL | Status: DC | PRN

## 2015-06-08 MED ORDER — OXYCODONE-ACETAMINOPHEN 5-325 MG PO TABS
1.0000 | ORAL_TABLET | Freq: Once | ORAL | Status: AC
  Administered 2015-06-08: 1 via ORAL
  Filled 2015-06-08: qty 1

## 2015-06-08 NOTE — ED Provider Notes (Signed)
CSN: [id number]     Arrival date & time 06/08/15  0013 History   First MD Initiated Contact with Patient 06/08/15 [phone]     Chief Complaint  Patient presents with  . Abdominal Pain     (Consider location/radiation/quality/duration/timing/severity/associated sxs/prior Treatment) HPI Comments: Patient is a 45 year old female with a history of diabetes mellitus and esophageal reflux. She presents to the emergency department for complaints of abdominal pain. She states that her pain began 3 days ago in her right lower quadrant. Her pain has been constant since this time and she believes that it is worsening. She states that she developed vomiting and diarrhea with nausea this morning. She has had approximately 2 episodes of emesis and 4 episodes of watery diarrhea. Patient was evaluated for similar symptoms at [facility]. She was discharged following a reassuring workup. Patient states that she has a history of cholecystectomy; no other abdominal surgeries. She denies chest pain, shortness of breath, hematemesis, melena, hematochezia, dysuria, and hematuria. No vaginal complaints. She reports a history of similar pain in her right lower quadrant in the past. She states that she was told that her pain was due to "muscle spasms".  Patient is a 45 y.o. female presenting with abdominal pain. The history is provided by the patient. No language interpreter was used.  Abdominal Pain Associated symptoms: diarrhea, fever (subjective) and vomiting   Associated symptoms: no chest pain, no dysuria, no hematuria and no shortness of breath     Past Medical History  Diagnosis Date  . Diabetes mellitus   . GERD (gastroesophageal reflux disease)   . Cellulitis 07/2011   Past Surgical History  Procedure Laterality Date  . Cholecystectomy    . Knee arthroscopy    . Laparoscopic tubal ligation  04/29/2011    Procedure: LAPAROSCOPIC TUBAL LIGATION;  Surgeon: Turner Daniels, MD;  Location: [facility];  Service: Gynecology;   Laterality: Bilateral;  with Filshie Clips  . Ablation    . Rotator cuff repair     Family History  Problem Relation Age of Onset  . Cancer Mother   . Diabetes Mother   . Hypertension Mother    Social History  Substance Use Topics  . Smoking status: Former Smoker -- 0.50 packs/day for 2 years    Types: Cigarettes    Quit date: 07/22/2011  . Smokeless tobacco: Never Used  . Alcohol Use: 0.0 oz/week    0 Standard drinks or equivalent per week     Comment: occasional   OB History    No data available      Review of Systems  Constitutional: Positive for fever (subjective).  Respiratory: Negative for shortness of breath.   Cardiovascular: Negative for chest pain.  Gastrointestinal: Positive for vomiting, abdominal pain and diarrhea.  Genitourinary: Negative for dysuria and hematuria.  All other systems reviewed and are negative.   Allergies  Vancomycin and Adhesive  Home Medications   Prior to Admission medications   Medication Sig Start Date End Date Taking? Authorizing Provider  furosemide (LASIX) 20 MG tablet Take 1 tablet (20 mg total) by mouth daily. 05/14/13  Yes Doris Cheadle, MD  insulin NPH-regular (NOVOLIN 70/30) (70-30) 100 UNIT/ML injection Inject 13 Units into the skin 2 (two) times daily with a meal. 04/08/13  Yes Nishant Dhungel, MD  metFORMIN (GLUCOPHAGE) 500 MG tablet Take 2 tablets (1,000 mg total) by mouth 2 (two) times daily with a meal. 04/08/13  Yes Nishant Dhungel, MD  omeprazole (PRILOSEC OTC) 20 MG tablet Take  20 mg by mouth daily.   Yes Historical Provider, MD  HYDROcodone-acetaminophen (NORCO/VICODIN) 5-325 MG tablet Take 1 tablet by mouth every 4 (four) hours as needed (for pain or cough). Patient not taking: Reported on 06/08/2015 04/05/15   Paula Libra, MD  hyoscyamine (LEVSIN/SL) 0.125 MG SL tablet Place 1 tablet (0.125 mg total) under the tongue every 4 (four) hours as needed. Patient not taking: Reported on 06/08/2015 06/05/15   April Palumbo,  MD  methocarbamol (ROBAXIN) 500 MG tablet Take 1 tablet (500 mg total) by mouth 2 (two) times daily. Patient not taking: Reported on 06/08/2015 06/05/15   April Palumbo, MD  naproxen (NAPROSYN) 500 MG tablet Take 1 tablet (500 mg total) by mouth 2 (two) times daily as needed for mild pain or moderate pain. Patient not taking: Reported on 06/08/2015 05/04/15   Trixie Dredge, PA-C  polyethylene glycol powder (MIRALAX) powder Take 17 g by mouth daily. Patient not taking: Reported on 06/08/2015 06/05/15   April Palumbo, MD   BP 121/80 mmHg  Pulse 106  Temp(Src) 98.1 F (36.7 C) (Oral)  Resp 19  Ht 5\' 1"  (1.549 m)  Wt 149.687 kg  BMI 62.39 kg/m2  SpO2 97%   Physical Exam  Constitutional: She is oriented to person, place, and time. She appears well-developed and well-nourished. No distress.  Nontoxic/nonseptic appearing  HENT:  Head: Normocephalic and atraumatic.  Eyes: Conjunctivae and EOM are normal. No scleral icterus.  Neck: Normal range of motion.  Cardiovascular: Regular rhythm and intact distal pulses.   Tachycardia, c/w prior ED evaluations.  Pulmonary/Chest: Effort normal and breath sounds normal. No respiratory distress. She has no wheezes. She has no rales.  Respirations even and unlabored  Abdominal: Soft. She exhibits no distension. There is tenderness. There is no rebound and no guarding.  Soft obese abdomen with mild focal tenderness in the right lower quadrant. No rigidity or peritoneal signs. No masses.  Musculoskeletal: Normal range of motion.  Neurological: She is alert and oriented to person, place, and time. She exhibits normal muscle tone. Coordination normal.  Patient ambulatory with steady gait  Skin: Skin is warm and dry. No rash noted. She is not diaphoretic. No erythema. No pallor.  Psychiatric: She has a normal mood and affect. Her behavior is normal.  Nursing note and vitals reviewed.   ED Course  Procedures (including critical care time) Labs Review Labs  Reviewed  COMPREHENSIVE METABOLIC PANEL - Abnormal; Notable for the following:    Sodium 131 (*)    Chloride 98 (*)    Glucose, Bld 500 (*)    All other components within normal limits  CBC - Abnormal; Notable for the following:    WBC 11.1 (*)    All other components within normal limits  URINALYSIS, ROUTINE W REFLEX MICROSCOPIC (NOT AT [facility]) - Abnormal; Notable for the following:    Glucose, UA >1000 (*)    All other components within normal limits  URINE MICROSCOPIC-ADD ON - Abnormal; Notable for the following:    Squamous Epithelial / LPF 0-5 (*)    Bacteria, UA RARE (*)    All other components within normal limits  LIPASE, BLOOD  I-STAT BETA HCG BLOOD, ED ([facility], [facility], [facility] ONLY)    Imaging Review No results found.   I have personally reviewed and evaluated these images and lab results as part of my medical decision-making.   EKG Interpretation None      5:00 AM Past ED visits reviewed. Patient has seemingly had a  CT abd/pelvis completed on a yearly basis for c/o abdominal pain. Per review of these images, they seem to be mostly for c/o RLQ pain. All imaging has shown no acute findings dating back to, at least, 2011. Laboratory work up also stable to at least 1 year ago (10/2013). Will attempt symptom management and reassess. I do not believe CT is indicated for further work up today if abdominal reexamination is stable.  6:32 AM On reexamination, patient is sleeping. Upon waking, she denies having any pain. Abdominal exam is improved. Will repeat CBG with plan to d/c with GI referral.  MDM   Final diagnoses:  Chronic abdominal pain    45 year old female presents to the emergency department for evaluation of right lower quadrant pain x 3 days with associated vomiting and diarrhea x 1 day. Patient has had no vomiting or diarrhea since arrival in ED exam room. She initially had focal tenderness to palpation in her right lower quadrant without peritoneal signs which resolved  following treatment with Toradol and Bentyl. On reexamination, patient states that she has no pain in her right lower abdomen.  Laboratory workup is noncontributory and consistent with priors dating back to July 2015. On further examination of the patient's chart, patient has had at least one CT scan per year for complaints of abdominal pain. Location of pain at prior visits also seems to be in the right lower quadrant. Given stable labs and improving abdominal exam, I suspect that patient's symptoms are an exacerbation of her chronic abdominal pain. Will refer to gastroenterology as patient denies ever following up with a GI physician. No indication for further emergent workup at this time. Supportive therapies prescribed and return precautions given. Patient discharged in good condition with no unaddressed concerns.   Filed Vitals:   06/08/15 0030 06/08/15 0336 06/08/15 0442  BP: 144/104 108/85 121/80  Pulse: 109 108 106  Temp: 98.5 F (36.9 C) 98.4 F (36.9 C) 98.1 F (36.7 C)  TempSrc: Oral Oral Oral  Resp: Height:    (1.549 m)  Weight:   149.687 kg  SpO2: 100% 100% 97%     Antony Madura, PA-C 06/08/15 0636  Derwood Kaplan, MD 06/09/15 2120

## 2015-06-08 NOTE — ED Notes (Signed)
Pt states that she has had lower abdominal pain, N/V/D since Friday. Seen [facility] at [facility] recently for same. Alert and oriented.

## 2015-06-08 NOTE — Discharge Instructions (Signed)

## 2015-06-08 NOTE — ED Notes (Signed)
Pt has been instructed not to drive for 4-6 hrs and has been instructed not to leave.

## 2015-06-09 ENCOUNTER — Ambulatory Visit (HOSPITAL_BASED_OUTPATIENT_CLINIC_OR_DEPARTMENT_OTHER)
Admission: RE | Admit: 2015-06-09 | Discharge: 2015-06-09 | Disposition: A | Discharge status: Home / Self Care | Payer: BLUE CROSS/BLUE SHIELD | Source: Ambulatory Visit | Attending: Nurse Practitioner | Admitting: Nurse Practitioner

## 2015-06-09 DIAGNOSIS — Z1231 Encounter for screening mammogram for malignant neoplasm of breast: Secondary | ICD-10-CM | POA: Diagnosis not present

## 2015-10-29 DIAGNOSIS — Z720 Tobacco use: Secondary | ICD-10-CM | POA: Insufficient documentation

## 2015-10-29 DIAGNOSIS — IMO0001 Reserved for inherently not codable concepts without codable children: Secondary | ICD-10-CM | POA: Insufficient documentation

## 2015-10-29 DIAGNOSIS — E1165 Type 2 diabetes mellitus with hyperglycemia: Secondary | ICD-10-CM | POA: Insufficient documentation

## 2015-12-01 ENCOUNTER — Emergency Department (HOSPITAL_BASED_OUTPATIENT_CLINIC_OR_DEPARTMENT_OTHER)
Admission: EM | Admit: 2015-12-01 | Discharge: 2015-12-01 | Disposition: A | Discharge status: Home / Self Care | Payer: BLUE CROSS/BLUE SHIELD | Attending: Emergency Medicine | Admitting: Emergency Medicine

## 2015-12-01 ENCOUNTER — Encounter (HOSPITAL_BASED_OUTPATIENT_CLINIC_OR_DEPARTMENT_OTHER): Payer: Self-pay | Admitting: *Deleted

## 2015-12-01 DIAGNOSIS — Z794 Long term (current) use of insulin: Secondary | ICD-10-CM | POA: Insufficient documentation

## 2015-12-01 DIAGNOSIS — E119 Type 2 diabetes mellitus without complications: Secondary | ICD-10-CM | POA: Insufficient documentation

## 2015-12-01 DIAGNOSIS — Z87891 Personal history of nicotine dependence: Secondary | ICD-10-CM | POA: Diagnosis not present

## 2015-12-01 DIAGNOSIS — M542 Cervicalgia: Secondary | ICD-10-CM | POA: Diagnosis present

## 2015-12-01 DIAGNOSIS — Z7984 Long term (current) use of oral hypoglycemic drugs: Secondary | ICD-10-CM | POA: Diagnosis not present

## 2015-12-01 DIAGNOSIS — M5412 Radiculopathy, cervical region: Secondary | ICD-10-CM | POA: Insufficient documentation

## 2015-12-01 MED ORDER — TRAMADOL HCL 50 MG PO TABS: 50.0000 mg | ORAL_TABLET | Freq: Four times a day (QID) | ORAL | 0 refills | Status: DC | PRN

## 2015-12-01 MED ORDER — NAPROXEN 500 MG PO TABS: 500.0000 mg | ORAL_TABLET | Freq: Two times a day (BID) | ORAL | 0 refills | Status: DC | PRN

## 2015-12-01 MED ORDER — KETOROLAC TROMETHAMINE 60 MG/2ML IM SOLN
60.0000 mg | Freq: Once | INTRAMUSCULAR | Status: AC
  Administered 2015-12-01: 60 mg via INTRAMUSCULAR
  Filled 2015-12-01: qty 2

## 2015-12-01 NOTE — ED Provider Notes (Signed)
[facility] Provider Note   CSN: [id number] Arrival date & time: 12/01/15  1744  By signing my name below, I, Jasmyn B. Alexander, attest that this documentation has been prepared under the direction and in the presence of Loren Raceravid Olayinka Gathers, MD. Electronically Signed: Gillis EndsJasmyn B. Lyn HollingsheadAlexander, ED Scribe. 12/01/15. 6:26 PM.  History   Chief Complaint Chief Complaint  Patient presents with  . Neck Pain    HPI HPI Comments: Brooke BurtonJennifer Peterson is a 45 y.o. female with PMHx of DDD of the neck who presents to the Emergency Department complaining of constant, throbbing, radiating neck pain to bilateral shoulders x 1 week. Pt reports that she was at work when pain suddenly began. Denies any recent injury to her neck. Pt has associated tingling of her left hand which began 3 days ago. She has taken Ibuprofen and Tylenol for pain. She denies any past similar episodes of pain. Never visited a [facility]. Denies any nausea or vomiting. No focal weakness. States she's been diagnosed with degenerative disc disease in the past  The history is provided by the patient. No language interpreter was used.   Past Medical History:  Diagnosis Date  . Cellulitis 07/2011  . Diabetes mellitus   . GERD (gastroesophageal reflux disease)     Patient Active Problem List   Diagnosis Date Noted  . Injury of right hand 05/19/2015  . Cervical disc disorder with radiculopathy of cervical region 10/10/2013  . Type II or unspecified type diabetes mellitus without mention of complication, uncontrolled 04/07/2013  . Pyelonephritis 04/06/2013  . RLQ abdominal pain 04/06/2013  . Fever 09/07/2012  . Headache(784.0) 09/07/2012  . Cellulitis 08/05/2011  . Peripheral edema 08/05/2011  . GERD (gastroesophageal reflux disease) 08/05/2011  . Obesity 08/05/2011    Past Surgical History:  Procedure Laterality Date  . ABLATION    . CHOLECYSTECTOMY    . KNEE ARTHROSCOPY    . LAPAROSCOPIC TUBAL LIGATION  04/29/2011   Procedure: LAPAROSCOPIC TUBAL LIGATION;  Surgeon: Turner Danielsavid C Lowe, MD;  Location: [facility];  Service: Gynecology;  Laterality: Bilateral;  with Filshie Clips  . ROTATOR CUFF REPAIR      OB History    No data available       Home Medications    Prior to Admission medications   Medication Sig Start Date End Date Taking? Authorizing Provider  dicyclomine (BENTYL) 20 MG tablet Take 1 tablet (20 mg total) by mouth 2 (two) times daily. For abdominal pain 06/08/15   Antony MaduraKelly Humes, PA-C  furosemide (LASIX) 20 MG tablet Take 1 tablet (20 mg total) by mouth daily. 05/14/13   Doris Cheadleeepak Advani, MD  HYDROcodone-acetaminophen (NORCO/VICODIN) 5-325 MG tablet Take 1 tablet by mouth every 4 (four) hours as needed (for pain or cough). Patient not taking: Reported on 06/08/2015 04/05/15   Paula LibraJohn Molpus, MD  hyoscyamine (LEVSIN/SL) 0.125 MG SL tablet Place 1 tablet (0.125 mg total) under the tongue every 4 (four) hours as needed. Patient not taking: Reported on 06/08/2015 06/05/15   April Palumbo, MD  insulin NPH-regular (NOVOLIN 70/30) (70-30) 100 UNIT/ML injection Inject 13 Units into the skin 2 (two) times daily with a meal. 04/08/13   Nishant Dhungel, MD  metFORMIN (GLUCOPHAGE) 500 MG tablet Take 2 tablets (1,000 mg total) by mouth 2 (two) times daily with a meal. 04/08/13   Nishant Dhungel, MD  methocarbamol (ROBAXIN) 500 MG tablet Take 1 tablet (500 mg total) by mouth 2 (two) times daily. Patient not taking: Reported on 06/08/2015 06/05/15   April  Palumbo, MD  naproxen (NAPROSYN) 500 MG tablet Take 1 tablet (500 mg total) by mouth 2 (two) times daily between meals as needed for mild pain or moderate pain. 12/01/15   Loren Racer, MD  omeprazole (PRILOSEC OTC) 20 MG tablet Take 20 mg by mouth daily.    Historical Provider, MD  polyethylene glycol powder (MIRALAX) powder Take 17 g by mouth daily. 06/08/15   Antony Madura, PA-C  traMADol (ULTRAM) 50 MG tablet Take 1 tablet (50 mg total) by mouth every 6 (six) hours as needed  for severe pain. 12/01/15   Loren Racer, MD    Family History Family History  Problem Relation Age of Onset  . Cancer Mother   . Diabetes Mother   . Hypertension Mother     Social History Social History  Substance Use Topics  . Smoking status: Former Smoker    Packs/day: 0.50    Years: 2.00    Types: Cigarettes    Quit date: 07/22/2011  . Smokeless tobacco: Never Used  . Alcohol use 0.0 oz/week     Comment: occasional     Allergies   Vancomycin and Adhesive [tape]  Review of Systems Review of Systems  Constitutional: Negative for chills and fever.  Eyes: Negative for visual disturbance.  Gastrointestinal: Negative for nausea and vomiting.  Musculoskeletal: Positive for neck pain. Negative for arthralgias, gait problem and myalgias.  Skin: Negative for rash and wound.  Neurological: Positive for numbness. Negative for dizziness, weakness, light-headedness and headaches.  All other systems reviewed and are negative.  Physical Exam Updated Vital Signs BP 131/70   Pulse 106   Temp 98.3 F (36.8 C) (Oral)   Resp 18   Ht 5\' 1"  (1.549 m)   Wt 297 lb (134.7 kg)   SpO2 96%   BMI 56.12 kg/m   Physical Exam  Constitutional: She is oriented to person, place, and time. She appears well-developed and well-nourished.  HENT:  Head: Normocephalic and atraumatic.  Eyes: EOM are normal. Pupils are equal, round, and reactive to light.  Neck: Normal range of motion. Neck supple.  No meningismus. Diffuse midline cervical tenderness without step-off or focality. Patient also has left greater than right paracervical muscular tenderness.  Cardiovascular: Normal rate and regular rhythm.   Pulmonary/Chest: Effort normal.  Abdominal: Soft.  Musculoskeletal: Normal range of motion. She exhibits no edema or tenderness.  Patient with left trapezius tenderness to palpation. Positive Tinel sign on the left with paresthesias to the fourth and fifth digits. 2+ radial pulses bilaterally.    Neurological: She is alert and oriented to person, place, and time.  Patient is alert and oriented x3 with clear, goal oriented speech. Patient has 5/5 motor in all extremities. Paresthesias to the palmar surface of the left fourth and fifth digits in the ulnar nerve pattern. Sensation otherwise intact. Bilateral finger-to-nose is normal with no signs of dysmetria. Patient has a normal gait and walks without assistance.  Skin: Skin is warm and dry. No rash noted. No erythema.  Psychiatric: She has a normal mood and affect. Her behavior is normal.  Nursing note and vitals reviewed.    ED Treatments / Results  DIAGNOSTIC STUDIES: Oxygen Saturation is 96% on RA, normal by my interpretation.    COORDINATION OF CARE: 6:23 PM-Discussed treatment plan which includes splinting of left hand, order of Toradol and follow-up with PCP with pt at bedside and pt agreed to plan.   Procedures Procedures (including critical care time)  Medications Ordered in ED  Medications  ketorolac (TORADOL) injection 60 mg (not administered)   Initial Impression / Assessment and Plan / ED Course  I have reviewed the triage vital signs and the nursing notes.  Pertinent labs & imaging results that were available during my care of the patient were reviewed by me and considered in my medical decision making (see chart for details).  Clinical Course   Patient presents with neck pain and paresthesias to the left hand. Likely radiculopathy versus carpal tunnel syndrome. Placed in Velcro splint. No red flag signs or symptoms indicating need for further imaging. Advised to follow-up with her primary physician. Return precautions given.   Final Clinical Impressions(s) / ED Diagnoses   Final diagnoses:  Cervical radiculopathy    New Prescriptions Current Discharge Medication List     I personally performed the services described in this documentation, which was scribed in my presence. The recorded information has  been reviewed and is accurate.       Loren Raceravid Hamna Asa, MD 12/01/15 1840

## 2015-12-01 NOTE — ED Triage Notes (Signed)
Neck pain for a week. No known injury.

## 2015-12-23 ENCOUNTER — Emergency Department (HOSPITAL_COMMUNITY): Payer: BLUE CROSS/BLUE SHIELD

## 2015-12-23 ENCOUNTER — Emergency Department (HOSPITAL_COMMUNITY)
Admission: EM | Admit: 2015-12-23 | Discharge: 2015-12-23 | Disposition: A | Discharge status: Home / Self Care | Payer: BLUE CROSS/BLUE SHIELD | Attending: Emergency Medicine | Admitting: Emergency Medicine

## 2015-12-23 ENCOUNTER — Encounter (HOSPITAL_COMMUNITY): Payer: Self-pay | Admitting: *Deleted

## 2015-12-23 DIAGNOSIS — Z87891 Personal history of nicotine dependence: Secondary | ICD-10-CM | POA: Diagnosis not present

## 2015-12-23 DIAGNOSIS — Z7984 Long term (current) use of oral hypoglycemic drugs: Secondary | ICD-10-CM | POA: Insufficient documentation

## 2015-12-23 DIAGNOSIS — Z79899 Other long term (current) drug therapy: Secondary | ICD-10-CM | POA: Diagnosis not present

## 2015-12-23 DIAGNOSIS — W1839XA Other fall on same level, initial encounter: Secondary | ICD-10-CM | POA: Diagnosis not present

## 2015-12-23 DIAGNOSIS — M25532 Pain in left wrist: Secondary | ICD-10-CM

## 2015-12-23 DIAGNOSIS — Y939 Activity, unspecified: Secondary | ICD-10-CM | POA: Insufficient documentation

## 2015-12-23 DIAGNOSIS — E119 Type 2 diabetes mellitus without complications: Secondary | ICD-10-CM | POA: Diagnosis not present

## 2015-12-23 DIAGNOSIS — S63502A Unspecified sprain of left wrist, initial encounter: Secondary | ICD-10-CM | POA: Diagnosis not present

## 2015-12-23 DIAGNOSIS — Z794 Long term (current) use of insulin: Secondary | ICD-10-CM | POA: Insufficient documentation

## 2015-12-23 DIAGNOSIS — Y929 Unspecified place or not applicable: Secondary | ICD-10-CM | POA: Diagnosis not present

## 2015-12-23 DIAGNOSIS — S6992XA Unspecified injury of left wrist, hand and finger(s), initial encounter: Secondary | ICD-10-CM | POA: Diagnosis present

## 2015-12-23 DIAGNOSIS — Y999 Unspecified external cause status: Secondary | ICD-10-CM | POA: Insufficient documentation

## 2015-12-23 NOTE — ED Provider Notes (Signed)
[facility]-EMERGENCY DEPT Provider Note   CSN: [id number] Arrival date & time: 12/23/15  2035   By signing my name below, I, Christy Sartorius, attest that this documentation has been prepared under the direction and in the presence of [employer], PA-C. Electronically Signed: Christy Sartorius, ED Scribe. 12/23/15. 10:54 PM.  History   Chief Complaint Chief Complaint  Patient presents with  . Wrist Pain   The history is provided by the patient and medical records. No language interpreter was used.    HPI Comments:  [name] is a 45 y.o. female who presents to the Emergency Department s/p fall last week complaining of left wrist pain.  Pt states she fell forward catching herself with her left wrist 3 days before.  She reports no weakness or numbness.  She denies hitting her head or LOC during the call.  Pt is left handed.  She has taken ibuprofen without relief.  No additional injury or complaint.  Movement and palpation aggravates the pain.    Past Medical History:  Diagnosis Date  . Cellulitis 07/2011  . Diabetes mellitus   . GERD (gastroesophageal reflux disease)     Patient Active Problem List   Diagnosis Date Noted  . Injury of right hand 05/19/2015  . Cervical disc disorder with radiculopathy of cervical region 10/10/2013  . Type II or unspecified type diabetes mellitus without mention of complication, uncontrolled 04/07/2013  . Pyelonephritis 04/06/2013  . RLQ abdominal pain 04/06/2013  . Fever 09/07/2012  . Headache(784.0) 09/07/2012  . Cellulitis 08/05/2011  . Peripheral edema 08/05/2011  . GERD (gastroesophageal reflux disease) 08/05/2011  . Obesity 08/05/2011    Past Surgical History:  Procedure Laterality Date  . ABLATION    . CHOLECYSTECTOMY    . KNEE ARTHROSCOPY    . LAPAROSCOPIC TUBAL LIGATION  04/29/2011   Procedure: LAPAROSCOPIC TUBAL LIGATION;  Surgeon: Turner Daniels, MD;  Location: [facility];  Service: Gynecology;  Laterality: Bilateral;  with  Filshie Clips  . ROTATOR CUFF REPAIR      OB History    No data available       Home Medications    Prior to Admission medications   Medication Sig Start Date End Date Taking? Authorizing Provider  dicyclomine (BENTYL) 20 MG tablet Take 1 tablet (20 mg total) by mouth 2 (two) times daily. For abdominal pain 06/08/15   Antony Madura, PA-C  furosemide (LASIX) 20 MG tablet Take 1 tablet (20 mg total) by mouth daily. 05/14/13   Doris Cheadle, MD  insulin NPH-regular (NOVOLIN 70/30) (70-30) 100 UNIT/ML injection Inject 13 Units into the skin 2 (two) times daily with a meal. 04/08/13   Nishant Dhungel, MD  metFORMIN (GLUCOPHAGE) 500 MG tablet Take 2 tablets (1,000 mg total) by mouth 2 (two) times daily with a meal. 04/08/13   Nishant Dhungel, MD  naproxen (NAPROSYN) 500 MG tablet Take 1 tablet (500 mg total) by mouth 2 (two) times daily between meals as needed for mild pain or moderate pain. 12/01/15   Loren Racer, MD  omeprazole (PRILOSEC OTC) 20 MG tablet Take 20 mg by mouth daily.    Historical Provider, MD  polyethylene glycol powder (MIRALAX) powder Take 17 g by mouth daily. 06/08/15   Antony Madura, PA-C  traMADol (ULTRAM) 50 MG tablet Take 1 tablet (50 mg total) by mouth every 6 (six) hours as needed for severe pain. 12/01/15   Loren Racer, MD    Family History Family History  Problem Relation Age of Onset  .  Cancer Mother   . Diabetes Mother   . Hypertension Mother     Social History Social History  Substance Use Topics  . Smoking status: Former Smoker    Packs/day: 0.50    Years: 2.00    Types: Cigarettes    Quit date: 07/22/2011  . Smokeless tobacco: Never Used  . Alcohol use 0.0 oz/week     Comment: occasional     Allergies   Vancomycin and Adhesive [tape]   Review of Systems Review of Systems  Constitutional: Negative for fever.  Musculoskeletal: Positive for arthralgias ( left wrist), joint swelling and myalgias.  Skin: Negative for wound.     Physical  Exam Updated Vital Signs BP 112/77   Pulse 100   Temp 98 F (36.7 C) (Oral)   Resp 16   SpO2 97%   Physical Exam  Constitutional: She appears well-developed and well-nourished. No distress.  HENT:  Head: Normocephalic and atraumatic.  Eyes: Conjunctivae are normal.  Neck: Normal range of motion.  Cardiovascular: Normal rate, regular rhythm and intact distal pulses.   Capillary refill < 3 sec  Pulmonary/Chest: Effort normal and breath sounds normal.  Musculoskeletal: She exhibits tenderness. She exhibits no edema.  Left wrist; mild swelling circumferentially without erythema or ecchymosis.  Full flexion, extension, pronation and supination with pain to the lateral aspect of the wrist.  Neurological: She is alert. Coordination normal.  Sensation grossly intact. Strength 5/5 with pain at the wrist and with grip strength.    Skin: Skin is warm and dry. She is not diaphoretic.  No tenting of the skin  Psychiatric: She has a normal mood and affect.  Nursing note and vitals reviewed.    ED Treatments / Results   DIAGNOSTIC STUDIES:  Oxygen Saturation is 97% on RA, NML by my interpretation.    COORDINATION OF CARE:  10:54 PM Discussed treatment plan with pt at bedside and pt agreed to plan.  Radiology Dg Wrist Complete Left  Result Date: 12/23/2015 CLINICAL DATA:  Fall 1 week ago with persistent wrist pain, initial encounter EXAM: LEFT WRIST - COMPLETE 3+ VIEW COMPARISON:  None. FINDINGS: There is no evidence of fracture or dislocation. There is no evidence of arthropathy or other focal bone abnormality. Soft tissues are unremarkable. IMPRESSION: No acute abnormality noted. Electronically Signed   By: Alcide Clever M.D.   On: 12/23/2015 21:57    Procedures Procedures (including critical care time)  Medications Ordered in ED Medications - No data to display   Initial Impression / Assessment and Plan / ED Course  I have reviewed the triage vital signs and the nursing  notes.  Pertinent labs & imaging results that were available during my care of the patient were reviewed by me and considered in my medical decision making (see chart for details).  Clinical Course  Value Comment By Time  DG Wrist Complete Left No fracture or dislocation Dierdre Forth, PA-C 09/06 2255   Patient X-Ray negative for obvious fracture or dislocation.  Pt advised to follow up with orthopedics. Patient given brace while in ED, conservative therapy recommended and discussed. Patient will be discharged home & is agreeable with above plan. Returns precautions discussed. Pt appears safe for discharge.  Final Clinical Impressions(s) / ED Diagnoses   Final diagnoses:  Left wrist sprain, initial encounter  Left wrist pain    New Prescriptions Discharge Medication List as of 12/23/2015 10:57 PM    I personally performed the services described in this documentation, which was  scribed in my presence. The recorded information has been reviewed and is accurate.    Dierdre ForthHannah Jeffrie Lofstrom, PA-C 12/24/15 0536    April Palumbo, MD 12/24/15 [phone]

## 2015-12-23 NOTE — ED Triage Notes (Signed)
Pt complains of pain in her left wrist since falling on it last week. No deformity or swelling noted

## 2015-12-23 NOTE — Discharge Instructions (Signed)
1. Medications: alternate naprosyn and tylenol for pain control, usual home medications 2. Treatment: rest, ice, elevate and use brace, drink plenty of fluids, gentle stretching 3. Follow Up: Please followup with your PCP in 1 week if no improvement for discussion of your diagnoses and further evaluation after today's visit; if you do not have a primary care doctor use the resource guide provided to find one; Please return to the ER for worsening symptoms or other concerns  

## 2016-02-16 ENCOUNTER — Encounter: Payer: Self-pay | Admitting: Family Medicine

## 2016-02-16 ENCOUNTER — Ambulatory Visit (INDEPENDENT_AMBULATORY_CARE_PROVIDER_SITE_OTHER): Payer: BLUE CROSS/BLUE SHIELD | Admitting: Family Medicine

## 2016-02-16 DIAGNOSIS — M25522 Pain in left elbow: Secondary | ICD-10-CM

## 2016-02-16 DIAGNOSIS — M5010 Cervical disc disorder with radiculopathy, unspecified cervical region: Secondary | ICD-10-CM | POA: Diagnosis not present

## 2016-02-16 MED ORDER — MELOXICAM 15 MG PO TABS: 15.0000 mg | ORAL_TABLET | Freq: Every day | ORAL | 2 refills | Status: DC

## 2016-02-16 MED ORDER — METHOCARBAMOL 500 MG PO TABS: 500.0000 mg | ORAL_TABLET | Freq: Three times a day (TID) | ORAL | 1 refills | Status: DC | PRN

## 2016-02-16 NOTE — Patient Instructions (Signed)
You have cervical radiculopathy (a pinched nerve in the neck). Meloxicam with food for pain and inflammation. Robaxin three times a day as needed for muscle spasms (can make you sleepy - if so do not drive while taking this). Consider cervical collar if severely painful. Simple range of motion exercises within limits of pain to prevent further stiffness. Consider physical therapy for stretching, exercises, traction, and modalities. Heat 15 minutes at a time 3-4 times a day to help with spasms. Watch head position when on computers, texting, when sleeping in bed - should in line with back to prevent further nerve traction and irritation. Consider home traction unit if you get benefit with this in physical therapy. If not improving we will consider an MRI.  You have insertional triceps tendinitis as well. Do tricep overhead presses and tricep kickbacks 3 sets of 10 once a day. Medicines as above. Compression sleeve may help as well. Follow up with me in 2-4 weeks for reevaluation.

## 2016-02-17 ENCOUNTER — Telehealth: Payer: Self-pay | Admitting: Family Medicine

## 2016-02-17 DIAGNOSIS — M25522 Pain in left elbow: Secondary | ICD-10-CM | POA: Insufficient documentation

## 2016-02-17 NOTE — Assessment & Plan Note (Signed)
Left triceps tendinitis - shown home exercises to do daily.  Meloxicam daily.  Compression sleeve may help as well.  F/u in 2-4 weeks.

## 2016-02-17 NOTE — Assessment & Plan Note (Signed)
start with meloxicam with robaxin as needed.  Heat for spasms.  Discussed ergonomic issues as well.  Consider physical therapy if not improving, MRI.

## 2016-02-17 NOTE — Progress Notes (Signed)
PCP: COUSINS, MELISSA A, FNP  Subjective:   HPI: Patient is a 45 y.o. female here for left neck/arm pain.  Patient reports over the past week she has developed pain on left side of neck into shoulder and fingers. Associated numbness into all 4 digits (sparing thumb). Pain is sharp, worse with neck motions, better with rest. Has tried ibuprofen with some relief. Tried robaxin, naproxen, toradol IM as well. No skin changes. No bowel/bladder dysfunction. Also having posterior left elbow pain - feels like a catch a couple times when straightening out the elbow.  Past Medical History:  Diagnosis Date  . Cellulitis 07/2011  . Diabetes mellitus   . GERD (gastroesophageal reflux disease)     Current Outpatient Prescriptions on File Prior to Visit  Medication Sig Dispense Refill  . dicyclomine (BENTYL) 20 MG tablet Take 1 tablet (20 mg total) by mouth 2 (two) times daily. For abdominal pain 20 tablet 0  . furosemide (LASIX) 20 MG tablet Take 1 tablet (20 mg total) by mouth daily. 30 tablet 0  . insulin NPH-regular (NOVOLIN 70/30) (70-30) 100 UNIT/ML injection Inject 13 Units into the skin 2 (two) times daily with a meal. 10 mL 12  . metFORMIN (GLUCOPHAGE) 500 MG tablet Take 2 tablets (1,000 mg total) by mouth 2 (two) times daily with a meal. 60 tablet 2  . omeprazole (PRILOSEC OTC) 20 MG tablet Take 20 mg by mouth daily.    . polyethylene glycol powder (MIRALAX) powder Take 17 g by mouth daily. 255 g 0  . traMADol (ULTRAM) 50 MG tablet Take 1 tablet (50 mg total) by mouth every 6 (six) hours as needed for severe pain. 10 tablet 0   No current facility-administered medications on file prior to visit.     Past Surgical History:  Procedure Laterality Date  . ABLATION    . CHOLECYSTECTOMY    . KNEE ARTHROSCOPY    . LAPAROSCOPIC TUBAL LIGATION  04/29/2011   Procedure: LAPAROSCOPIC TUBAL LIGATION;  Surgeon: Turner Danielsavid C Lowe, MD;  Location: [facility];  Service: Gynecology;  Laterality: Bilateral;   with Filshie Clips  . ROTATOR CUFF REPAIR      Allergies  Allergen Reactions  . Vancomycin     Red man syndrome  . Adhesive [Tape] Itching and Rash    Social History   Social History  . Marital status: Married    Spouse name: N/A  . Number of children: N/A  . Years of education: N/A   Occupational History  . Not on file.   Social History Main Topics  . Smoking status: Former Smoker    Packs/day: 0.50    Years: 2.00    Types: Cigarettes    Quit date: 07/22/2011  . Smokeless tobacco: Never Used  . Alcohol use 0.0 oz/week     Comment: occasional  . Drug use: No  . Sexual activity: Yes    Birth control/ protection: Surgical   Other Topics Concern  . Not on file   Social History Narrative  . No narrative on file    Family History  Problem Relation Age of Onset  . Cancer Mother   . Diabetes Mother   . Hypertension Mother     BP 104/65   Pulse (!) 114   Ht 5\' 1"  (1.549 m)   Wt 298 lb (135.2 kg)   BMI 56.31 kg/m   Review of Systems: See HPI above.    Objective:  Physical Exam:  Gen: NAD, comfortable in exam room  Neck: No gross deformity, swelling, bruising. TTP left cervical paraspinal region.  No midline/bony TTP. FROM. BUE strength 5/5.   Sensation diminished to light touch left 2nd through 5th digits. 2+ equal reflexes in triceps, biceps, brachioradialis tendons. Negative spurlings.  Left shoulder: No swelling, ecchymoses.  No gross deformity. No TTP. FROM. Negative Hawkins, Neers. Negative Yergasons. Strength 5/5 with empty can and resisted internal/external rotation. Negative apprehension. NV intact distally.  Left elbow: No gross deformity, swelling, bruising. TTP posterior elbow in triceps.  No other tenderness. FROM elbow with 5/5 strength wrist and elbow motions. Mild pain extension of elbow. Collateral ligaments intact. NVI distally.  Assessment & Plan:  1. Cervical radiculopathy - start with meloxicam with robaxin as  needed.  Heat for spasms.  Discussed ergonomic issues as well.  Consider physical therapy if not improving, MRI.  2. Left triceps tendinitis - shown home exercises to do daily.  Meloxicam daily.  Compression sleeve may help as well.  F/u in 2-4 weeks.

## 2016-02-17 NOTE — Telephone Encounter (Signed)
Spoke to patient and told her to bring sleeve to get a different size.

## 2016-03-09 ENCOUNTER — Other Ambulatory Visit: Payer: Self-pay | Admitting: *Deleted

## 2016-03-09 ENCOUNTER — Ambulatory Visit: Payer: BLUE CROSS/BLUE SHIELD | Admitting: Family Medicine

## 2016-03-09 ENCOUNTER — Ambulatory Visit (INDEPENDENT_AMBULATORY_CARE_PROVIDER_SITE_OTHER): Payer: BLUE CROSS/BLUE SHIELD | Admitting: Family Medicine

## 2016-03-09 ENCOUNTER — Encounter: Payer: Self-pay | Admitting: Family Medicine

## 2016-03-09 DIAGNOSIS — M5010 Cervical disc disorder with radiculopathy, unspecified cervical region: Secondary | ICD-10-CM

## 2016-03-09 MED ORDER — PREDNISONE 10 MG PO TABS: ORAL_TABLET | ORAL | 0 refills | Status: DC

## 2016-03-09 NOTE — Patient Instructions (Signed)
You have cervical radiculopathy (a pinched nerve in the neck). Take prednisone dose pack as directed. Stop the meloxicam for now but you can restart taking it when you're done with the prednisone. Robaxin three times a day as needed for muscle spasms (can make you sleepy - if so do not drive while taking this). Consider cervical collar if severely painful. Simple range of motion exercises within limits of pain to prevent further stiffness. Consider physical therapy for stretching, exercises, traction, and modalities. Heat 15 minutes at a time 3-4 times a day to help with spasms. Watch head position when on computers, texting, when sleeping in bed - should in line with back to prevent further nerve traction and irritation. Consider home traction unit if you get benefit with this in physical therapy. If not improving we will consider an MRI. Call me in 1 week to let me know how you're doing.

## 2016-03-14 ENCOUNTER — Telehealth: Payer: Self-pay | Admitting: Family Medicine

## 2016-03-14 ENCOUNTER — Ambulatory Visit (HOSPITAL_BASED_OUTPATIENT_CLINIC_OR_DEPARTMENT_OTHER)
Admission: RE | Admit: 2016-03-14 | Discharge: 2016-03-14 | Disposition: A | Discharge status: Home / Self Care | Payer: BLUE CROSS/BLUE SHIELD | Source: Ambulatory Visit | Attending: Family Medicine | Admitting: Family Medicine

## 2016-03-14 DIAGNOSIS — M542 Cervicalgia: Secondary | ICD-10-CM | POA: Diagnosis not present

## 2016-03-14 NOTE — Telephone Encounter (Signed)
Evaluated rash - only on volar aspect of hands - states this has some pruritis with it but rash is nowhere else on body.  Would expect this to be whole body (at least the pruritis if not the rash) if was from the medications - couldn't rule this out but wouldn't state this is from the prednisone.  Has not used any new soaps, shampoos, gloves, etc.  Advised to finish the prednisone.  Also stated not improving with the medication to date - will go ahead with imaging of cervical spine at this point to assess for source of radiculopathy.

## 2016-03-14 NOTE — Assessment & Plan Note (Signed)
Will trial prednisone dose pack - if not improving then would consider MRI of cervical spine.  Continue robaxin if needed.  Heat for spasms.  Discussed ergonomic issues as well.  Call us in a week to let us know how she's doing.

## 2016-03-14 NOTE — Progress Notes (Addendum)
PCP: COUSINS, MELISSA A, FNP  Subjective:   HPI: Patient is a 45 y.o. female here for left neck/arm pain.  10/31: Patient reports over the past week she has developed pain on left side of neck into shoulder and fingers. Associated numbness into all 4 digits (sparing thumb). Pain is sharp, worse with neck motions, better with rest. Has tried ibuprofen with some relief. Tried robaxin, naproxen, toradol IM as well. No skin changes. No bowel/bladder dysfunction. Also having posterior left elbow pain - feels like a catch a couple times when straightening out the elbow.  11/22: Patient reports her elbow has improved but neck and arm pain have not. Taking meloxicam and robaxin as directed. Gets numbness and tingling into all fingers of left hand. No bowel/bladder dysfunction. No skin changes.  Past Medical History:  Diagnosis Date  . Cellulitis 07/2011  . Diabetes mellitus   . GERD (gastroesophageal reflux disease)     Current Outpatient Prescriptions on File Prior to Visit  Medication Sig Dispense Refill  . dicyclomine (BENTYL) 20 MG tablet Take 1 tablet (20 mg total) by mouth 2 (two) times daily. For abdominal pain 20 tablet 0  . furosemide (LASIX) 20 MG tablet Take 1 tablet (20 mg total) by mouth daily. 30 tablet 0  . insulin NPH-regular (NOVOLIN 70/30) (70-30) 100 UNIT/ML injection Inject 13 Units into the skin 2 (two) times daily with a meal. 10 mL 12  . metFORMIN (GLUCOPHAGE) 500 MG tablet Take 2 tablets (1,000 mg total) by mouth 2 (two) times daily with a meal. 60 tablet 2  . methocarbamol (ROBAXIN) 500 MG tablet Take 1 tablet (500 mg total) by mouth every 8 (eight) hours as needed for muscle spasms. 60 tablet 1  . omeprazole (PRILOSEC OTC) 20 MG tablet Take 20 mg by mouth daily.    . polyethylene glycol powder (MIRALAX) powder Take 17 g by mouth daily. 255 g 0  . traMADol (ULTRAM) 50 MG tablet Take 1 tablet (50 mg total) by mouth every 6 (six) hours as needed for severe pain.  10 tablet 0   No current facility-administered medications on file prior to visit.     Past Surgical History:  Procedure Laterality Date  . ABLATION    . CHOLECYSTECTOMY    . KNEE ARTHROSCOPY    . LAPAROSCOPIC TUBAL LIGATION  04/29/2011   Procedure: LAPAROSCOPIC TUBAL LIGATION;  Surgeon: Turner Danielsavid C Lowe, MD;  Location: [facility];  Service: Gynecology;  Laterality: Bilateral;  with Filshie Clips  . ROTATOR CUFF REPAIR      Allergies  Allergen Reactions  . Vancomycin     Red man syndrome  . Adhesive [Tape] Itching and Rash    Social History   Social History  . Marital status: Married    Spouse name: N/A  . Number of children: N/A  . Years of education: N/A   Occupational History  . Not on file.   Social History Main Topics  . Smoking status: Former Smoker    Packs/day: 0.50    Years: 2.00    Types: Cigarettes    Quit date: 07/22/2011  . Smokeless tobacco: Never Used  . Alcohol use 0.0 oz/week     Comment: occasional  . Drug use: No  . Sexual activity: Yes    Birth control/ protection: Surgical   Other Topics Concern  . Not on file   Social History Narrative  . No narrative on file    Family History  Problem Relation Age of Onset  .  Cancer Mother   . Diabetes Mother   . Hypertension Mother     BP 121/66   Pulse (!) 114   Ht 5\' 1"  (1.549 m)   Wt 299 lb (135.6 kg)   BMI 56.50 kg/m   Review of Systems: See HPI above.    Objective:  Physical Exam:  Gen: NAD, comfortable in exam room  Neck: No gross deformity, swelling, bruising. TTP left cervical paraspinal region.  No midline/bony TTP. FROM. 4/5 strength now with tricep extension and finger abduction - other muscle groups 5/5.   Sensation diminished to light touch left 2nd through 5th digits. 2+ equal reflexes in triceps, biceps, brachioradialis tendons. Negative spurlings.  Assessment & Plan:  1. Cervical radiculopathy - Will trial prednisone dose pack - if not improving then would consider MRI  of cervical spine.  Continue robaxin if needed.  Heat for spasms.  Discussed ergonomic issues as well.  Call us in a week to let us know how she's doing.  Addendum:  MRI reviewed and discussed with patient.  Essentially normal MRI - no evidence left sided nerve impingement.  Suggests pathology more distal than cervical spine.  Will go ahead with NCV/EMGs of left upper extremity.

## 2016-03-17 NOTE — Addendum Note (Signed)
Addended by: Kathi SimpersWISE, Reynalda Canny F on: 03/17/2016 01:57 PM   Modules accepted: Orders

## 2016-03-28 ENCOUNTER — Ambulatory Visit (INDEPENDENT_AMBULATORY_CARE_PROVIDER_SITE_OTHER): Payer: BLUE CROSS/BLUE SHIELD

## 2016-03-28 DIAGNOSIS — M50121 Cervical disc disorder at C4-C5 level with radiculopathy: Secondary | ICD-10-CM | POA: Diagnosis not present

## 2016-03-28 DIAGNOSIS — M5114 Intervertebral disc disorders with radiculopathy, thoracic region: Secondary | ICD-10-CM

## 2016-03-28 DIAGNOSIS — M5010 Cervical disc disorder with radiculopathy, unspecified cervical region: Secondary | ICD-10-CM

## 2016-03-30 NOTE — Addendum Note (Signed)
Addended by: Kathi SimpersWISE, Danni Shima F on: 03/30/2016 09:02 AM   Modules accepted: Orders

## 2016-04-07 ENCOUNTER — Telehealth: Payer: Self-pay | Admitting: Family Medicine

## 2016-05-19 NOTE — Telephone Encounter (Signed)
Finished

## 2016-05-26 ENCOUNTER — Encounter (INDEPENDENT_AMBULATORY_CARE_PROVIDER_SITE_OTHER): Payer: Self-pay | Admitting: Diagnostic Neuroimaging

## 2016-05-26 ENCOUNTER — Ambulatory Visit (INDEPENDENT_AMBULATORY_CARE_PROVIDER_SITE_OTHER): Payer: BLUE CROSS/BLUE SHIELD | Admitting: Diagnostic Neuroimaging

## 2016-05-26 ENCOUNTER — Telehealth: Payer: Self-pay | Admitting: Family Medicine

## 2016-05-26 DIAGNOSIS — Z0289 Encounter for other administrative examinations: Secondary | ICD-10-CM

## 2016-05-26 DIAGNOSIS — R200 Anesthesia of skin: Secondary | ICD-10-CM

## 2016-05-26 NOTE — Telephone Encounter (Signed)
Did she get them done yet?  I haven't received results.

## 2016-05-26 NOTE — Telephone Encounter (Signed)
Spoke to patient and told her that once we receive the results, the physician will contact her.

## 2016-05-27 NOTE — Procedures (Addendum)
[facility]  NCS (NERVE CONDUCTION STUDY) WITH EMG (ELECTROMYOGRAPHY) REPORT   STUDY DATE: 05/26/16 PATIENT NAME: Brooke BurtonJennifer Peterson DOB: 1970-10-22 MRN: [id number]  ORDERING CLINICIAN: Norton BlizzardShane Hudnall, MD  TECHNOLOGIST: Charlesetta IvoryBeau Handy ELECTROMYOGRAPHER: Glenford BayleyVikram R. Lyndal Alamillo, MD  CLINICAL INFORMATION: 46 year old female with diabetes, here for evaluation of left greater than right handed numbness and tingling for past 1 month. Exam notable for decreased pinprick sensation in left hand digits 2 and 3 and right hand digits 2 through 5. Patient has had diabetes for last 2 years and last hemoglobin A1c was 11.9.   FINDINGS: NERVE CONDUCTION STUDY: Bilateral median motor responses and F wave latencies are normal. Left ulnar motor response and F-wave latency is normal.  Bilateral median sensory responses have prolonged peak latencies, borderline decreased amplitude on the left and normal amplitude on the right.  Left ulnar sensory response is normal.  Left median-ulnar transcarpal comparison testing shows prolonged peak latency difference of 0.7 ms (normal less than 0.4 ms).  Right median-ulnar transcarpal comparison testing shows prolonged peak latency difference of 1.1 ms (normal less than 0.4 ms).   NEEDLE ELECTROMYOGRAPHY: Needle examination of left upper extremity deltoid, biceps, triceps, flexor carpi radialis, first dorsal interosseous is normal.   Left C6-7 paraspinal muscles are normal.   IMPRESSION:  Abnormal study demonstrating: - Mild bilateral median neuropathies at the wrist consistent with mild bilateral carpal tunnel syndrome. - No evidence of cervical radiculopathy.      INTERPRETING PHYSICIAN:  Suanne MarkerVIKRAM R. Shaquoya Cosper, MD Certified in Neurology, Neurophysiology and Neuroimaging  [facility] [address], [address] [place], [place] [address] [phone]    [facility]    Nerve / Sites Rec. Site Peak Lat Ref.  Amp Ref. Segments  Distance Peak Diff Ref.    ms ms V V  cm ms ms  L Median - Orthodromic (Dig II, Mid palm)     Dig II Wrist 3.7 ?3.4 9 ?10 Dig II - Wrist 13    L Median, Ulnar - Transcarpal comparison     Median Palm Wrist 2.7 ?2.2 39 ?50 Median Palm - Wrist 8       Ulnar Palm Wrist 2.0 ?2.2 11 ?12 Ulnar Palm - Wrist 8          Median Palm - Ulnar Palm  0.7 ?0.4  L Ulnar - Orthodromic, (Dig V, Mid palm)     Dig V Wrist 2.8 ?3.1 6 ?5 Dig V - Wrist 11    R Median, Ulnar - Transcarpal comparison     Median Palm Wrist 2.7 ?2.2 43 ?50 Median Palm - Wrist 8       Ulnar Palm Wrist 1.6 ?2.2 21 ?12 Ulnar Palm - Wrist 8          Median Palm - Ulnar Palm  1.1 ?0.4  R Median - Orthodromic (Dig II, Mid palm)     Dig II Wrist 3.9 ?3.4 18 ?10 Dig II - Wrist 13                  [facility]    Nerve / Sites Muscle Latency Ref. Amplitude Ref. Rel Amp Segments Distance Lat Diff Velocity Ref. Area    ms ms mV mV %  cm ms m/s m/s mVms  L Median - APB     Wrist APB 4.3 ?4.4 5.3 ?4.0 100 Wrist - APB 7    17.5     Upper arm APB 7.7  5.3  101 Upper arm - Wrist  18 3.4 53  16.4  L Ulnar - ADM     Wrist ADM 2.8 ?3.3 12.9 ?6.0 100 Wrist - ADM 7    32.0     B.Elbow ADM 5.7  11.4  88.6 B.Elbow - Wrist 15 2.9 52 ?49 30.5     A.Elbow ADM 7.4  12.2  107 A.Elbow - B.Elbow 10 1.8 56 ?49 32.1         A.Elbow - Wrist  4.6     R Median - APB     Wrist APB 4.2 ?4.4 6.3 ?4.0 100 Wrist - APB 7    25.4     Upper arm APB 7.9  7.1  113 Upper arm - Wrist 18 3.7 49  28.8            F  Wave    Nerve F Lat Ref.   ms ms  L Median - APB 27.1 ?31.0  L Ulnar - ADM 24.8 ?32.0          EMG full       EMG Summary Table    Spontaneous MUAP Recruitment  Muscle IA Fib PSW Fasc Other Amp Dur. Poly Pattern  L. Deltoid Normal None None None _______ Normal Normal Normal Normal  L. Biceps brachii Normal None None None _______ Normal Normal Normal Normal  L. Triceps brachii Normal None None None _______ Normal Normal Normal Normal  L. Flexor carpi  radialis Normal None None None _______ Normal Normal Normal Normal  L. First dorsal interosseous Normal None None None _______ Normal Normal Normal Normal  L. Cervical paraspinals Normal None None None _______ Normal Normal Normal Normal

## 2016-05-31 ENCOUNTER — Telehealth: Payer: Self-pay | Admitting: Family Medicine

## 2016-05-31 NOTE — Telephone Encounter (Signed)
Yes I finally received them and will call her today.

## 2016-05-31 NOTE — Telephone Encounter (Signed)
Spoke with patient about diagnosis of carpal tunnel syndrome.  She is going to come in tomorrow to get wrist braces and start with these.  We discussed prednisone, injections, operative intervention as well.

## 2016-07-04 ENCOUNTER — Ambulatory Visit (INDEPENDENT_AMBULATORY_CARE_PROVIDER_SITE_OTHER): Payer: BLUE CROSS/BLUE SHIELD | Admitting: Family Medicine

## 2016-07-04 ENCOUNTER — Encounter: Payer: Self-pay | Admitting: Family Medicine

## 2016-07-04 VITALS — BP 120/83 | HR 94 | Ht 61.0 in | Wt 285.0 lb

## 2016-07-04 DIAGNOSIS — M25531 Pain in right wrist: Secondary | ICD-10-CM | POA: Diagnosis not present

## 2016-07-04 DIAGNOSIS — G5603 Carpal tunnel syndrome, bilateral upper limbs: Secondary | ICD-10-CM

## 2016-07-04 DIAGNOSIS — M25532 Pain in left wrist: Secondary | ICD-10-CM | POA: Diagnosis not present

## 2016-07-04 MED ORDER — METHYLPREDNISOLONE ACETATE 40 MG/ML IJ SUSP
40.0000 mg | Freq: Once | INTRAMUSCULAR | Status: AC
  Administered 2016-07-04: 40 mg via INTRA_ARTICULAR

## 2016-07-04 NOTE — Patient Instructions (Signed)
You have carpal tunnel syndrome. Wear the wrist braces at nighttime and as often as possible during the day Ibuprofen 600mg  three times a day with food OR aleve 2 tabs twice a day with food for pain and inflammation. A prednisone dose pack is a consideration. Corticosteroid injection is a consideration to help with pain and inflammation - you were given these today Follow up with me in 1 month.

## 2016-07-05 ENCOUNTER — Telehealth: Payer: Self-pay | Admitting: Family Medicine

## 2016-07-05 MED ORDER — TRAMADOL HCL 50 MG PO TABS: 50.0000 mg | ORAL_TABLET | Freq: Four times a day (QID) | ORAL | 0 refills | Status: DC | PRN

## 2016-07-05 NOTE — Telephone Encounter (Signed)
Spoke to patient and told her to come by the office and have her wrists looked at. Also told patient that it can take a few days for the cortisone to kick in. Patient stated she would come by the office to have her wrists checked.

## 2016-07-05 NOTE — Telephone Encounter (Signed)
Patient calling requesting to speak to nurse or provider. Patient received two cortisone shots in both wrists yesterday and states she started experiencing pain by mid day and into this morning. Patient also states both wrists are swollen

## 2016-07-05 NOTE — Telephone Encounter (Signed)
Saw patient in the office - no evidence of infection at injection sites or swelling, other abnormalities besides small localized bruising.  We discussed this is normal - currently in the period between when numbing medication has worn off and cortisone has not kicked in yet.  Given Rx for tramadol to take as needed.  If still not improving after a couple weeks would consider hand surgeon referral.

## 2016-07-05 NOTE — Telephone Encounter (Signed)
It would not be unusual to have both swelling and pain (numbing medication wears off and takes a few days for steroid to kick in) - swelling from injecting 3mL of fluid into both carpal tunnels.  If she has time, can she swing by for me to take a look though just to be sure?  Thanks!

## 2016-07-06 DIAGNOSIS — G5603 Carpal tunnel syndrome, bilateral upper limbs: Secondary | ICD-10-CM | POA: Insufficient documentation

## 2016-07-06 NOTE — Progress Notes (Signed)
PCP: COUSINS, MELISSA A, FNP  Subjective:   HPI: Patient is a 46 y.o. female here for bilateral wrist pain.  Patient reports she's had bilateral wrist pain for several months. Associated numbness into 1st-4th digits more on the left than the right. Pain levels 8/10 on left, 5/10 right, sharp. Wearing braces at night and as often as possible during the day. No skin changes. Neck pain has improved.    Past Medical History:  Diagnosis Date  . Cellulitis 07/2011  . Diabetes mellitus   . GERD (gastroesophageal reflux disease)     Current Outpatient Prescriptions on File Prior to Visit  Medication Sig Dispense Refill  . dicyclomine (BENTYL) 20 MG tablet Take 1 tablet (20 mg total) by mouth 2 (two) times daily. For abdominal pain 20 tablet 0  . furosemide (LASIX) 20 MG tablet Take 1 tablet (20 mg total) by mouth daily. 30 tablet 0  . insulin NPH-regular (NOVOLIN 70/30) (70-30) 100 UNIT/ML injection Inject 13 Units into the skin 2 (two) times daily with a meal. 10 mL 12  . meloxicam (MOBIC) 15 MG tablet TK 1 T PO D  2  . methocarbamol (ROBAXIN) 500 MG tablet Take 1 tablet (500 mg total) by mouth every 8 (eight) hours as needed for muscle spasms. 60 tablet 1  . omeprazole (PRILOSEC OTC) 20 MG tablet Take 20 mg by mouth daily.    . polyethylene glycol powder (MIRALAX) powder Take 17 g by mouth daily. 255 g 0  . predniSONE (DELTASONE) 10 MG tablet 6 tabs po day 1, 5 tabs po day 2, 4 tabs po day 3, 3 tabs po day 4, 2 tabs po day 5, 1 tab po day 6 21 tablet 0   No current facility-administered medications on file prior to visit.     Past Surgical History:  Procedure Laterality Date  . ABLATION    . CHOLECYSTECTOMY    . KNEE ARTHROSCOPY    . LAPAROSCOPIC TUBAL LIGATION  04/29/2011   Procedure: LAPAROSCOPIC TUBAL LIGATION;  Surgeon: Turner Daniels, MD;  Location: [facility];  Service: Gynecology;  Laterality: Bilateral;  with Filshie Clips  . ROTATOR CUFF REPAIR      Allergies  Allergen  Reactions  . Vancomycin     Red man syndrome  . Adhesive [Tape] Itching and Rash    Social History   Social History  . Marital status: Married    Spouse name: N/A  . Number of children: N/A  . Years of education: N/A   Occupational History  . Not on file.   Social History Main Topics  . Smoking status: Former Smoker    Packs/day: 0.50    Years: 2.00    Types: Cigarettes    Quit date: 07/22/2011  . Smokeless tobacco: Never Used  . Alcohol use 0.0 oz/week     Comment: occasional  . Drug use: No  . Sexual activity: Yes    Birth control/ protection: Surgical   Other Topics Concern  . Not on file   Social History Narrative  . No narrative on file    Family History  Problem Relation Age of Onset  . Cancer Mother   . Diabetes Mother   . Hypertension Mother     BP 120/83   Pulse 94   Ht 5\' 1"  (1.549 m)   Wt 285 lb (129.3 kg)   BMI 53.85 kg/m   Review of Systems: See HPI above.     Objective:  Physical Exam:  Gen:  NAD, comfortable in exam room  Bilateral wrists: No gross deformity, swelling, bruising, atrophy. TTP over carpal tunnels.  No other tenderness. FROM digits. 5-/5 strength bilateral finger abduction, left thumb opposition.  5/5 all other motions of fingers, wrists. + tinels and phalens bilaterally. Sensation intact to light touch currently.   Assessment & Plan:  1. Bilateral carpal tunnel syndrome - confirmed by NCV/EMGs.  Discussed options - went ahead with bilateral injections, continue wrist braces.  Ibuprofen or aleve if needed.  Consider prednisone dose pack, ortho referral if not improving.  f/u in 1 month.  After informed written consent patient was seated on exam table.  Area between median nerve and ulnar artery identified with ultrasound, prepped with alcohol swab, then left carpal tunnel injected with 2:1 bupivicaine: depomedrol.  Patient tolerated procedure well without immediate complications.  After informed written consent patient  was seated on exam table.  Area between median nerve and ulnar artery identified with ultrasound, prepped with alcohol swab, then right carpal tunnel injected with 2:1 bupivicaine: depomedrol.  Patient tolerated procedure well without immediate complications.

## 2016-07-06 NOTE — Assessment & Plan Note (Signed)
confirmed by NCV/EMGs.  Discussed options - went ahead with bilateral injections, continue wrist braces.  Ibuprofen or aleve if needed.  Consider prednisone dose pack, ortho referral if not improving.  f/u in 1 month.  After informed written consent patient was seated on exam table.  Area between median nerve and ulnar artery identified with ultrasound, prepped with alcohol swab, then left carpal tunnel injected with 2:1 bupivicaine: depomedrol.  Patient tolerated procedure well without immediate complications.  After informed written consent patient was seated on exam table.  Area between median nerve and ulnar artery identified with ultrasound, prepped with alcohol swab, then right carpal tunnel injected with 2:1 bupivicaine: depomedrol.  Patient tolerated procedure well without immediate complications.

## 2016-08-03 ENCOUNTER — Ambulatory Visit (INDEPENDENT_AMBULATORY_CARE_PROVIDER_SITE_OTHER): Payer: BLUE CROSS/BLUE SHIELD | Admitting: Family Medicine

## 2016-08-03 ENCOUNTER — Encounter: Payer: Self-pay | Admitting: Family Medicine

## 2016-08-03 DIAGNOSIS — G5603 Carpal tunnel syndrome, bilateral upper limbs: Secondary | ICD-10-CM

## 2016-08-03 NOTE — Patient Instructions (Signed)
Unfortunately the conservative treatment hasn't worked to date. We will go ahead with referral to a hand surgeon to talk about carpal tunnel release.

## 2016-08-04 ENCOUNTER — Ambulatory Visit: Payer: BLUE CROSS/BLUE SHIELD | Admitting: Family Medicine

## 2016-08-04 NOTE — Progress Notes (Signed)
PCP: Smothers, Cathleen Corti, NP  Subjective:   HPI: Patient is a 46 y.o. female here for bilateral wrist pain.  3/19: Patient reports she's had bilateral wrist pain for several months. Associated numbness into 1st-4th digits more on the left than the right. Pain levels 8/10 on left, 5/10 right, sharp. Wearing braces at night and as often as possible during the day. No skin changes. Neck pain has improved.    4/18: Patient continues to struggle with 8/10, sharp bilateral wrist pain. Associated numbness into fingers of both hands. Worse on left than right (left handed). No improvement with injections. Feels finger like they swell up also. No skin changes.  Past Medical History:  Diagnosis Date  . Cellulitis 07/2011  . Diabetes mellitus   . GERD (gastroesophageal reflux disease)     Current Outpatient Prescriptions on File Prior to Visit  Medication Sig Dispense Refill  . dicyclomine (BENTYL) 20 MG tablet Take 1 tablet (20 mg total) by mouth 2 (two) times daily. For abdominal pain 20 tablet 0  . furosemide (LASIX) 20 MG tablet Take 1 tablet (20 mg total) by mouth daily. 30 tablet 0  . insulin NPH-regular (NOVOLIN 70/30) (70-30) 100 UNIT/ML injection Inject 13 Units into the skin 2 (two) times daily with a meal. 10 mL 12  . meloxicam (MOBIC) 15 MG tablet TK 1 T PO D  2  . metFORMIN (GLUCOPHAGE) 1000 MG tablet Take by mouth.    . methocarbamol (ROBAXIN) 500 MG tablet Take 1 tablet (500 mg total) by mouth every 8 (eight) hours as needed for muscle spasms. 60 tablet 1  . omeprazole (PRILOSEC OTC) 20 MG tablet Take 20 mg by mouth daily.    . polyethylene glycol powder (MIRALAX) powder Take 17 g by mouth daily. 255 g 0  . predniSONE (DELTASONE) 10 MG tablet 6 tabs po day 1, 5 tabs po day 2, 4 tabs po day 3, 3 tabs po day 4, 2 tabs po day 5, 1 tab po day 6 21 tablet 0  . traMADol (ULTRAM) 50 MG tablet Take 1 tablet (50 mg total) by mouth every 6 (six) hours as needed for severe pain. 40  tablet 0   No current facility-administered medications on file prior to visit.     Past Surgical History:  Procedure Laterality Date  . ABLATION    . CHOLECYSTECTOMY    . KNEE ARTHROSCOPY    . LAPAROSCOPIC TUBAL LIGATION  04/29/2011   Procedure: LAPAROSCOPIC TUBAL LIGATION;  Surgeon: Turner Daniels, MD;  Location: [facility];  Service: Gynecology;  Laterality: Bilateral;  with Filshie Clips  . ROTATOR CUFF REPAIR      Allergies  Allergen Reactions  . Vancomycin     Red man syndrome  . Adhesive [Tape] Itching and Rash    Social History   Social History  . Marital status: Married    Spouse name: N/A  . Number of children: N/A  . Years of education: N/A   Occupational History  . Not on file.   Social History Main Topics  . Smoking status: Former Smoker    Packs/day: 0.50    Years: 2.00    Types: Cigarettes    Quit date: 07/22/2011  . Smokeless tobacco: Never Used  . Alcohol use 0.0 oz/week     Comment: occasional  . Drug use: No  . Sexual activity: Yes    Birth control/ protection: Surgical   Other Topics Concern  . Not on file   Social  History Narrative  . No narrative on file    Family History  Problem Relation Age of Onset  . Cancer Mother   . Diabetes Mother   . Hypertension Mother     BP 121/84   Pulse 95   Ht  (1.549 m)   Wt 285 lb (129.3 kg)   BMI 53.85 kg/m   Review of Systems: See HPI above.     Objective:  Physical Exam:  Gen: NAD, comfortable in exam room  Bilateral wrists: No gross deformity, swelling, bruising, atrophy. TTP over carpal tunnels.  No other tenderness. FROM digits. 4/5 strength left thumb opposition.  5/5 other muscle groups of digits and wrists.   + tinels and phalens bilaterally. Sensation diminished bilateral 2nd-4th digits.   Assessment & Plan:  1. Bilateral carpal tunnel syndrome - confirmed by NCV/EMGs.  Worsening despite injections, wrist braces.  Will refer to hand surgeon to discuss bilateral carpal  tunnel release surgery.  Ibuprofen or aleve as needed in meantime.  Continue using braces.

## 2016-08-04 NOTE — Assessment & Plan Note (Signed)
confirmed by NCV/EMGs.  Worsening despite injections, wrist braces.  Will refer to hand surgeon to discuss bilateral carpal tunnel release surgery.  Ibuprofen or aleve as needed in meantime.  Continue using braces.

## 2016-08-05 NOTE — Addendum Note (Signed)
Addended by: Kathi Simpers F on: 08/05/2016 08:32 AM   Modules accepted: Orders

## 2016-08-30 ENCOUNTER — Other Ambulatory Visit: Payer: Self-pay | Admitting: Orthopedic Surgery

## 2016-09-09 ENCOUNTER — Encounter (HOSPITAL_COMMUNITY): Payer: Self-pay | Admitting: *Deleted

## 2016-09-09 NOTE — Progress Notes (Signed)
PCP is Dr. Glenetta BorgSmothers @ [address].  LOV 06/2016 also the one that takes care of her diabetes. I did instruct the patient to hold PO diabetes med and only take 1/2 dose of her SSI if blood surgar is above 220. She verbalizes and understands.

## 2016-09-09 NOTE — Pre-Procedure Instructions (Signed)
    Lorenza BurtonJennifer Chase  09/09/2016      [facility] - [place], [place] - [address] [place] [address] Phone: [phone] Fax: [phone]  [facility] - [place], [place] - [address] [place] [facility] [address] [place] [address] Phone: [phone] Fax: [phone]    Your procedure is scheduled on May 29th Tuesday   [facility] at 6:00 AM   Call this number if you have problems the morning of surgery:  [phone]   Remember:  Do not eat food or drink liquids after midnight Monday.   Take these medicines the morning of surgery with A SIP OF WATER : Omeprazole              DO NOT take your diabetes medication that morning.   Do not wear jewelry, make-up or nail polish.  Do not wear lotions, powders, or perfumes, or deoderant.  Do not shave 48 hours prior to surgery.  Men may shave face and neck.  Do not bring valuables to the hospital.  [facility] is not responsible for any belongings or valuables.  Contacts, dentures or bridgework may not be worn into surgery.  Leave your suitcase in the car.  After surgery it may be brought to your room.  For patients admitted to the hospital, discharge time will be determined by your treatment team.  Patients discharged the day of surgery will not be allowed to drive home, will need someone to stay with you for the first 24 yrs.

## 2016-09-13 ENCOUNTER — Encounter (HOSPITAL_COMMUNITY): Payer: Self-pay | Admitting: *Deleted

## 2016-09-13 ENCOUNTER — Ambulatory Visit (HOSPITAL_COMMUNITY): Payer: BLUE CROSS/BLUE SHIELD | Admitting: Anesthesiology

## 2016-09-13 ENCOUNTER — Ambulatory Visit (HOSPITAL_COMMUNITY)
Admission: RE | Admit: 2016-09-13 | Discharge: 2016-09-13 | Disposition: A | Discharge status: Home / Self Care | Payer: BLUE CROSS/BLUE SHIELD | Source: Ambulatory Visit | Attending: Orthopedic Surgery | Admitting: Orthopedic Surgery

## 2016-09-13 ENCOUNTER — Encounter (HOSPITAL_COMMUNITY)
Admission: RE | Disposition: A | Discharge status: Home / Self Care | Payer: Self-pay | Source: Ambulatory Visit | Attending: Orthopedic Surgery

## 2016-09-13 DIAGNOSIS — K219 Gastro-esophageal reflux disease without esophagitis: Secondary | ICD-10-CM | POA: Insufficient documentation

## 2016-09-13 DIAGNOSIS — E119 Type 2 diabetes mellitus without complications: Secondary | ICD-10-CM | POA: Diagnosis not present

## 2016-09-13 DIAGNOSIS — Z79899 Other long term (current) drug therapy: Secondary | ICD-10-CM | POA: Diagnosis not present

## 2016-09-13 DIAGNOSIS — Z833 Family history of diabetes mellitus: Secondary | ICD-10-CM | POA: Insufficient documentation

## 2016-09-13 DIAGNOSIS — Z809 Family history of malignant neoplasm, unspecified: Secondary | ICD-10-CM | POA: Insufficient documentation

## 2016-09-13 DIAGNOSIS — Z794 Long term (current) use of insulin: Secondary | ICD-10-CM | POA: Diagnosis not present

## 2016-09-13 DIAGNOSIS — Z8249 Family history of ischemic heart disease and other diseases of the circulatory system: Secondary | ICD-10-CM | POA: Diagnosis not present

## 2016-09-13 DIAGNOSIS — Z881 Allergy status to other antibiotic agents status: Secondary | ICD-10-CM | POA: Diagnosis not present

## 2016-09-13 DIAGNOSIS — G5602 Carpal tunnel syndrome, left upper limb: Secondary | ICD-10-CM | POA: Insufficient documentation

## 2016-09-13 HISTORY — PX: CARPAL TUNNEL RELEASE: SHX101

## 2016-09-13 LAB — BASIC METABOLIC PANEL
Anion gap: 9 (ref 5–15)
BUN: 13 mg/dL (ref 6–20)
CO2: 23 mmol/L (ref 22–32)
Calcium: 9.3 mg/dL (ref 8.9–10.3)
Chloride: 104 mmol/L (ref 101–111)
Creatinine, Ser: 0.54 mg/dL (ref 0.44–1.00)
GFR calc Af Amer: >60 mL/min (ref >60)
GFR calc non Af Amer: >60 mL/min (ref >60)
Glucose, Bld: 185 mg/dL — ABNORMAL HIGH (ref 65–99)
Potassium: 3.8 mmol/L (ref 3.5–5.1)
Sodium: 136 mmol/L (ref 135–145)

## 2016-09-13 LAB — CBC
HCT: 41.4 % (ref 36.0–46.0)
Hemoglobin: 13.7 g/dL (ref 12.0–15.0)
MCH: 27.9 pg (ref 26.0–34.0)
MCHC: 33.1 g/dL (ref 30.0–36.0)
MCV: 84.3 fL (ref 78.0–100.0)
Platelets: 359 10*3/uL (ref 150–400)
RBC: 4.91 MIL/uL (ref 3.87–5.11)
RDW: 13.5 % (ref 11.5–15.5)
WBC: 10.2 10*3/uL (ref 4.0–10.5)

## 2016-09-13 LAB — GLUCOSE, CAPILLARY
Glucose-Capillary: 175 mg/dL — ABNORMAL HIGH (ref 65–99)
Glucose-Capillary: 175 mg/dL — ABNORMAL HIGH (ref 65–99)

## 2016-09-13 LAB — HCG, SERUM, QUALITATIVE: Preg, Serum: NEGATIVE

## 2016-09-13 SURGERY — CARPAL TUNNEL RELEASE
Anesthesia: Monitor Anesthesia Care | Site: Wrist | Laterality: Left

## 2016-09-13 MED ORDER — LIDOCAINE HCL (PF) 0.5 % IJ SOLN
INTRAMUSCULAR | Status: DC | PRN
  Administered 2016-09-13: 50 mL via INTRAVENOUS

## 2016-09-13 MED ORDER — BUPIVACAINE HCL (PF) 0.25 % IJ SOLN
INTRAMUSCULAR | Status: AC
  Filled 2016-09-13: qty 30

## 2016-09-13 MED ORDER — CEFAZOLIN SODIUM-DEXTROSE 2-4 GM/100ML-% IV SOLN
2.0000 g | INTRAVENOUS | Status: AC
  Administered 2016-09-13: 2 g via INTRAVENOUS
  Filled 2016-09-13: qty 100

## 2016-09-13 MED ORDER — MIDAZOLAM HCL 2 MG/2ML IJ SOLN
INTRAMUSCULAR | Status: AC
  Filled 2016-09-13: qty 2

## 2016-09-13 MED ORDER — ONDANSETRON HCL 4 MG/2ML IJ SOLN
INTRAMUSCULAR | Status: AC
  Filled 2016-09-13: qty 2

## 2016-09-13 MED ORDER — FENTANYL CITRATE (PF) 100 MCG/2ML IJ SOLN
INTRAMUSCULAR | Status: AC
  Filled 2016-09-13: qty 2

## 2016-09-13 MED ORDER — BUPIVACAINE HCL 0.25 % IJ SOLN
INTRAMUSCULAR | Status: DC | PRN
  Administered 2016-09-13: 10 mL

## 2016-09-13 MED ORDER — CHLORHEXIDINE GLUCONATE 4 % EX LIQD: 60.0000 mL | Freq: Once | CUTANEOUS | Status: DC

## 2016-09-13 MED ORDER — FENTANYL CITRATE (PF) 250 MCG/5ML IJ SOLN
INTRAMUSCULAR | Status: AC
  Filled 2016-09-13: qty 5

## 2016-09-13 MED ORDER — MIDAZOLAM HCL 2 MG/2ML IJ SOLN
INTRAMUSCULAR | Status: DC | PRN
  Administered 2016-09-13: 2 mg via INTRAVENOUS

## 2016-09-13 MED ORDER — PROPOFOL 10 MG/ML IV BOLUS
INTRAVENOUS | Status: AC
  Filled 2016-09-13: qty 20

## 2016-09-13 MED ORDER — HYDROCODONE-ACETAMINOPHEN 5-325 MG PO TABS: 1.0000 | ORAL_TABLET | Freq: Four times a day (QID) | ORAL | 0 refills | Status: DC | PRN

## 2016-09-13 MED ORDER — MIDAZOLAM HCL 2 MG/2ML IJ SOLN
INTRAMUSCULAR | Status: AC
Start: 2016-09-13 — End: 2016-09-13
  Filled 2016-09-13: qty 2

## 2016-09-13 MED ORDER — PROPOFOL 500 MG/50ML IV EMUL
INTRAVENOUS | Status: DC | PRN
  Administered 2016-09-13: 50 ug/kg/min via INTRAVENOUS

## 2016-09-13 MED ORDER — ONDANSETRON HCL 4 MG/2ML IJ SOLN
INTRAMUSCULAR | Status: DC | PRN
  Administered 2016-09-13: 4 mg via INTRAVENOUS

## 2016-09-13 MED ORDER — LACTATED RINGERS IV SOLN
INTRAVENOUS | Status: DC
  Administered 2016-09-13: 08:00:00 via INTRAVENOUS

## 2016-09-13 MED ORDER — PROPOFOL 10 MG/ML IV BOLUS
INTRAVENOUS | Status: DC | PRN
  Administered 2016-09-13: 20 mg via INTRAVENOUS

## 2016-09-13 SURGICAL SUPPLY — 35 items
BNDG CMPR 9X4 STRL LF SNTH (GAUZE/BANDAGES/DRESSINGS) ×1
BNDG COHESIVE 3X5 TAN STRL LF (GAUZE/BANDAGES/DRESSINGS) ×2 IMPLANT
BNDG COHESIVE 4X5 TAN STRL (GAUZE/BANDAGES/DRESSINGS) ×2 IMPLANT
BNDG ESMARK 4X9 LF (GAUZE/BANDAGES/DRESSINGS) ×2 IMPLANT
BNDG GAUZE ELAST 4 BULKY (GAUZE/BANDAGES/DRESSINGS) ×2 IMPLANT
CORDS BIPOLAR (ELECTRODE) ×2 IMPLANT
COVER SURGICAL LIGHT HANDLE (MISCELLANEOUS) IMPLANT
CUFF TOURNIQUET SINGLE 18IN (TOURNIQUET CUFF) ×2 IMPLANT
CUFF TOURNIQUET SINGLE 24IN (TOURNIQUET CUFF) IMPLANT
DECANTER SPIKE VIAL GLASS SM (MISCELLANEOUS) IMPLANT
DRSG PAD ABDOMINAL 8X10 ST (GAUZE/BANDAGES/DRESSINGS) ×2 IMPLANT
DURAPREP 26ML APPLICATOR (WOUND CARE) ×2 IMPLANT
GAUZE SPONGE 4X4 12PLY STRL (GAUZE/BANDAGES/DRESSINGS) IMPLANT
GAUZE SPONGE 4X4 12PLY STRL LF (GAUZE/BANDAGES/DRESSINGS) ×2 IMPLANT
GAUZE XEROFORM 1X8 LF (GAUZE/BANDAGES/DRESSINGS) ×2 IMPLANT
GLOVE BIOGEL PI IND STRL 8 (GLOVE) ×1 IMPLANT
GLOVE BIOGEL PI INDICATOR 8 (GLOVE) ×1
GLOVE SURG ORTHO 8.0 STRL STRW (GLOVE) ×2 IMPLANT
GOWN STRL REUS W/ TWL LRG LVL3 (GOWN DISPOSABLE) ×1 IMPLANT
GOWN STRL REUS W/ TWL XL LVL3 (GOWN DISPOSABLE) ×1 IMPLANT
GOWN STRL REUS W/TWL LRG LVL3 (GOWN DISPOSABLE) ×2
GOWN STRL REUS W/TWL XL LVL3 (GOWN DISPOSABLE) ×2
KIT BASIN OR (CUSTOM PROCEDURE TRAY) ×2 IMPLANT
KIT ROOM TURNOVER OR (KITS) ×2 IMPLANT
MANIFOLD NEPTUNE II (INSTRUMENTS) ×2 IMPLANT
NEEDLE HYPO 25GX1X1/2 BEV (NEEDLE) IMPLANT
NS IRRIG 1000ML POUR BTL (IV SOLUTION) ×2 IMPLANT
PACK ORTHO EXTREMITY (CUSTOM PROCEDURE TRAY) ×2 IMPLANT
PAD ARMBOARD 7.5X6 YLW CONV (MISCELLANEOUS) ×2 IMPLANT
PAD CAST 4YDX4 CTTN HI CHSV (CAST SUPPLIES) IMPLANT
PADDING CAST COTTON 4X4 STRL (CAST SUPPLIES)
SUT ETHILON 4 0 PS 2 18 (SUTURE) ×2 IMPLANT
SYR CONTROL 10ML LL (SYRINGE) IMPLANT
TOWEL OR 17X26 10 PK STRL BLUE (TOWEL DISPOSABLE) ×2 IMPLANT
UNDERPAD 30X30 (UNDERPADS AND DIAPERS) ×2 IMPLANT

## 2016-09-13 NOTE — Anesthesia Procedure Notes (Signed)
Procedure Name: MAC Date/Time: 09/13/2016 8:28 AM Performed by: Mervyn Gay Pre-anesthesia Checklist: Patient identified, Patient being monitored, Timeout performed, Emergency Drugs available and Suction available Patient Re-evaluated:Patient Re-evaluated prior to inductionOxygen Delivery Method: Nasal cannula Number of attempts: 1 Placement Confirmation: positive ETCO2 Dental Injury: Teeth and Oropharynx as per pre-operative assessment

## 2016-09-13 NOTE — Anesthesia Postprocedure Evaluation (Signed)
Anesthesia Post Note  Patient: [name]  Procedure(s) Performed: Procedure(s) (LRB): LEFT CARPAL TUNNEL RELEASE (Left)  Patient location during evaluation: PACU Anesthesia Type: MAC Level of consciousness: awake and alert Pain management: pain level controlled Vital Signs Assessment: post-procedure vital signs reviewed and stable Respiratory status: spontaneous breathing, nonlabored ventilation, respiratory function stable and patient connected to nasal cannula oxygen Cardiovascular status: stable and blood pressure returned to baseline Anesthetic complications: no       Last Vitals:  Vitals:   09/13/16 1010 09/13/16 1015  BP: 103/66 (!) 117/55  Pulse: 88   Resp: 17 18  Temp: 36.6 C     Last Pain:  Vitals:   09/13/16 0650  TempSrc: Oral                 Joyleen Haselton DAVID

## 2016-09-13 NOTE — Transfer of Care (Signed)
Immediate Anesthesia Transfer of Care Note  Patient: Jonella Redditt  Procedure(s) Performed: Procedure(s): LEFT CARPAL TUNNEL RELEASE (Left)  Patient Location: PACU  Anesthesia Type:MAC and Bier block  Level of Consciousness: awake, alert , oriented and patient cooperative  Airway & Oxygen Therapy: Patient Spontanous Breathing and Patient connected to nasal cannula oxygen  Post-op Assessment: Report given to RN, Post -op Vital signs reviewed and stable and Patient moving all extremities X 4  Post vital signs: Reviewed and stable  Last Vitals:  Vitals:   09/13/16 0650  BP: 130/62  Pulse: (!) 105  Resp: 20  Temp: 36.8 C    Last Pain:  Vitals:   09/13/16 0650  TempSrc: Oral      Patients Stated Pain Goal: 7 (35/68/61 6837)  Complications: No apparent anesthesia complications

## 2016-09-13 NOTE — Op Note (Signed)
NAME:  [name], [name]                   ACCOUNT NO.:  MEDICAL RECORD NO.:  [id number]  LOCATION:                                 FACILITY:  PHYSICIAN:  Cindee SaltGary Verner Kopischke, M.D.            DATE OF BIRTH:  DATE OF PROCEDURE:  09/13/2016 DATE OF DISCHARGE:                              OPERATIVE REPORT   PREOPERATIVE DIAGNOSIS:  Carpal tunnel syndrome, left hand.  POSTOPERATIVE DIAGNOSIS:  Carpal tunnel syndrome, left hand.  OPERATION:  Decompression of left median nerve.  SURGEON:  Cindee SaltGary Lezli Danek, MD.  ASSISTANT:  None.  ANESTHESIA:  Upper arm IV regional.  PLACE OF SURGERY:  [place].  ANESTHESIOLOGIST:  Kaylyn LayerKevin D. Michelle Piperssey, M.D.  HISTORY:  The patient is a 46 year old female with a history of carpal tunnel syndrome.  Nerve conduction is positive, which has not responded to conservative treatment.  She has elected to undergo surgical decompression of the median nerve.  Pre, peri and postoperative course have been discussed along with risks and complications.  She is aware that there is no guarantee to the surgery, the possibility of infection; recurrence of injury to arteries, nerves, tendons; incomplete relief of symptoms and dystrophy.  In the preoperative area, the patient is seen, the extremity marked by both patient and surgeon.  Antibiotic given.  PROCEDURE IN DETAIL:  The patient was brought to the operating room, where an upper arm IV regional anesthetic was carried out without difficulty under the direction of the Anesthesia Department.  She was prepped using DuraPrep in the supine position with the left arm free.  A 3-minute dry time was allowed.  Time-out taken, confirming the patient and procedure.  A longitudinal incision was made in the left palm, carried down through subcutaneous tissue.  Bleeders were electrocauterized with bipolar.  The palmar fascia was split distally. The superficial palmar arch was identified.  The flexor tendon to the ring and small finger was  identified.  To the ulnar side of median nerve, the carpal retinaculum was incised with sharp dissection.  A right angle and Sewall retractor were placed between skin and forearm fascia.  The fascia was released for approximately 2 cm proximal to the wrist crease under direct vision.  The canal was explored.  The area of compression to the nerve was apparent.  Motor branch entered into muscle distally.  No further lesions were identified.  The wound was irrigated with saline and the skin closed with interrupted 4-0 nylon sutures.  A local infiltration with 0.25% bupivacaine without epinephrine was given.  10 mL was used.  Sterile compressive dressing with a finger splint was applied.  On deflation of the tourniquet, all fingers immediately pinked.  She was taken to the recovery room for observation in satisfactory condition.  She will be discharged to home [facility] [place] [facility] on [place].          ______________________________ Cindee SaltGary Rosella Crandell, M.D.     GK/MEDQ  D:  09/13/2016  T:  09/13/2016  Job:  [id number]

## 2016-09-13 NOTE — Discharge Instructions (Signed)

## 2016-09-13 NOTE — Brief Op Note (Signed)
09/13/2016  9:10 AM  PATIENT:  Lorenza BurtonJennifer Chase  46 y.o. female  PRE-OPERATIVE DIAGNOSIS:  LEFT CARPAL TUNNEL RELEASE  POST-OPERATIVE DIAGNOSIS:  LEFT CARPAL TUNNEL RELEASE  PROCEDURE:  Procedure(s): LEFT CARPAL TUNNEL RELEASE (Left)  SURGEON:  Surgeon(s) and Role:    Cindee Salt* Issai Werling, MD - Primary  PHYSICIAN ASSISTANT:   ASSISTANTS: none   ANESTHESIA:   local and regional  EBL:  Total I/O In: -  Out: 1 [Blood:1]  BLOOD ADMINISTERED:none  DRAINS: none   LOCAL MEDICATIONS USED:  BUPIVICAINE   SPECIMEN:  No Specimen  DISPOSITION OF SPECIMEN:  N/A  COUNTS:  YES  TOURNIQUET:   Total Tourniquet Time Documented: Upper Arm (Left) - 24 minutes Total: Upper Arm (Left) - 24 minutes   DICTATION: .Other Dictation: Dictation Number [id number]  PLAN OF CARE: Discharge to home after PACU  PATIENT DISPOSITION:  PACU - hemodynamically stable.

## 2016-09-13 NOTE — Anesthesia Preprocedure Evaluation (Signed)
Anesthesia Evaluation  Patient identified by MRN, date of birth, ID band Patient awake    Reviewed: Allergy & Precautions, NPO status , Patient's Chart, lab work & pertinent test results  History of Anesthesia Complications Negative for: history of anesthetic complications  Airway Mallampati: II  TM Distance: >3 FB Neck ROM: Full    Dental  (+) Teeth Intact, Dental Advisory Given   Pulmonary sleep apnea , former smoker,    breath sounds clear to auscultation + decreased breath sounds      Cardiovascular Exercise Tolerance: Poor + DOE   Rhythm:Regular Rate:Tachycardia     Neuro/Psych  Headaches,  Neuromuscular disease negative psych ROS   GI/Hepatic GERD  Medicated and Controlled,  Endo/Other  diabetes, Well Controlled, Type 2, Insulin Dependent, Oral Hypoglycemic AgentsMorbid obesity  Renal/GU      Musculoskeletal  (+) Arthritis ,   Abdominal (+) + obese,  Abdomen: soft. Bowel sounds: normal.  Peds  Hematology   Anesthesia Other Findings   Reproductive/Obstetrics                             BP Readings from Last 3 Encounters:  09/13/16 130/62  08/03/16 121/84  07/04/16 120/83   Lab Results  Component Value Date   WBC 10.2 09/13/2016   HGB 13.7 09/13/2016   HCT 41.4 09/13/2016   MCV 84.3 09/13/2016   PLT 359 09/13/2016     Chemistry      Component Value Date/Time   NA 136 09/13/2016 0710   K 3.8 09/13/2016 0710   CL 104 09/13/2016 0710   CO2 23 09/13/2016 0710   BUN 13 09/13/2016 0710   CREATININE 0.54 09/13/2016 0710      Component Value Date/Time   CALCIUM 9.3 09/13/2016 0710   ALKPHOS 104 06/08/2015 0040   AST 23 06/08/2015 0040   ALT 24 06/08/2015 0040   BILITOT 0.5 06/08/2015 0040      Anesthesia Physical Anesthesia Plan  ASA: III  Anesthesia Plan: MAC and Bier Block   Post-op Pain Management:    Induction: Intravenous  Airway Management Planned:  Nasal Cannula and Natural Airway  Additional Equipment:   Intra-op Plan:   Post-operative Plan:   Informed Consent: I have reviewed the patients History and Physical, chart, labs and discussed the procedure including the risks, benefits and alternatives for the proposed anesthesia with the patient or authorized representative who has indicated his/her understanding and acceptance.   Dental advisory given  Plan Discussed with: CRNA, Anesthesiologist and Surgeon  Anesthesia Plan Comments:         Anesthesia Quick Evaluation

## 2016-09-13 NOTE — Anesthesia Procedure Notes (Signed)
Anesthesia Regional Block: Bier block (IV Regional)   Pre-Anesthetic Checklist: ,, timeout performed, Correct Patient, Correct Site, Correct Laterality, Correct Procedure, Correct Position, site marked, Risks and benefits discussed,  Surgical consent,  Pre-op evaluation,  At surgeon's request and post-op pain management  Laterality: Left  Prep: chloraprep        Procedures:,,,,,,, Esmarch exsanguination, #20gu IV placed and double tourniquet utilized  Narrative:   Performed by: Personally  Anesthesiologist: Arta BruceSSEY, KEVIN

## 2016-09-13 NOTE — H&P (Signed)
[name] is an 46 y.o. female.   Chief Complaint:pain hands HPI: [name] is a 46 year old left-hand-dominant female referred by Dr. Pearletha ForgeHudnall for consultation regarding numbness and tingling both hands left greater than right. This been going on since February. She has been wearing splints taking Tylenol. The splints have helped. She has had injections in March. She states the injections did not help. She has no history of injury to the hand or to the neck. She is waking for out of 7 nights. She has a stabbing pain with a VAS score 10/10 when it occurs. She complains of constant numbness and tingling thumb through ring fingers left greater than right. She has a history of diabetes. She has no history of thyroid problems arthritis or gout. Family history is positive diabetes and arthritis and negative for thyroid problems and gout. He states nothing seems to make it better or worse. She complains of constant numbness and tingling. She has had nerve conductions done revealing a mild carpal tunnel syndrome bilaterally. She states the injections did not cause her sugar to increase. [facility]. This shows a sensory delay of 3.7 normal 3.4 on her left side. There is an increase transcarpal comparison to the ulnar nerve VII versus 2.2. Has a motor delay of 3 of 4.3 with normal 4.4. Right side shows a sensory delay of 3.9 normal being 3.4. An injection was given to the left carpal canal.which gave her temporary relief but is now as bad or worse than where it started.           Past Medical History:  Diagnosis Date  . Cellulitis 07/2011  . Diabetes mellitus    dx 2016  . GERD (gastroesophageal reflux disease)     Past Surgical History:  Procedure Laterality Date  . ABLATION    . CHOLECYSTECTOMY    . KNEE ARTHROSCOPY    . LAPAROSCOPIC TUBAL LIGATION  04/29/2011   Procedure: LAPAROSCOPIC TUBAL LIGATION;  Surgeon: Turner Danielsavid C Lowe, MD;  Location: [facility];  Service:  Gynecology;  Laterality: Bilateral;  with Filshie Clips  . ROTATOR CUFF REPAIR    . TUBAL LIGATION      Family History  Problem Relation Age of Onset  . Cancer Mother   . Diabetes Mother   . Hypertension Mother    Social History:  reports that she quit smoking about 5 years ago. Her smoking use included Cigarettes. She has a 1.00 pack-year smoking history. She has never used smokeless tobacco. She reports that she drinks alcohol. She reports that she does not use drugs.  Allergies:  Allergies  Allergen Reactions  . Vancomycin     Red man syndrome  . Adhesive [Tape] Itching and Rash    No prescriptions prior to admission.    No results found for this or any previous visit (from the past 48 hour(s)).  No results found.   Pertinent items are noted in HPI.  Height 5\' 1"  (1.549 m), weight 103 kg (227 lb).  General appearance: alert, cooperative and appears stated age Head: Normocephalic, without obvious abnormality Neck: no JVD Resp: clear to auscultation bilaterally Cardio: regular rate and rhythm, S1, S2 normal, no murmur, click, rub or gallop GI: soft, non-tender; bowel sounds normal; no masses,  no organomegaly Extremities: numbness left hand Pulses: 2+ and symmetric Skin: Skin color, texture, turgor normal. No rashes or lesions Neurologic: Grossly normal Incision/Wound: na  Assessment/Plan Assessment:  1. Bilateral hand pain    Plan: She  had good short-term response to the injection. She would like to proceed to have her left side surgically released. Pre-peri-and postoperative course are discussed along with risk complications. She is aware that there is no guarantee to the surgery the possibility of infection recurrence injury to arteries nerves tendons complete relief symptoms and dystrophy. She is scheduled for left carpal tunnel release and outpatient under regional anesthesia.      Malyia Moro R 09/13/2016, 5:13 AM

## 2016-09-13 NOTE — Op Note (Signed)
Dictation Number [id number]

## 2016-09-14 ENCOUNTER — Encounter (HOSPITAL_COMMUNITY): Payer: Self-pay | Admitting: Orthopedic Surgery

## 2017-04-24 ENCOUNTER — Other Ambulatory Visit: Payer: Self-pay

## 2017-04-24 ENCOUNTER — Encounter (HOSPITAL_BASED_OUTPATIENT_CLINIC_OR_DEPARTMENT_OTHER): Payer: Self-pay

## 2017-04-24 ENCOUNTER — Emergency Department (HOSPITAL_BASED_OUTPATIENT_CLINIC_OR_DEPARTMENT_OTHER)
Admission: EM | Admit: 2017-04-24 | Discharge: 2017-04-25 | Disposition: A | Discharge status: Home / Self Care | Payer: BLUE CROSS/BLUE SHIELD | Attending: Emergency Medicine | Admitting: Emergency Medicine

## 2017-04-24 DIAGNOSIS — Y990 Civilian activity done for income or pay: Secondary | ICD-10-CM | POA: Diagnosis not present

## 2017-04-24 DIAGNOSIS — X500XXA Overexertion from strenuous movement or load, initial encounter: Secondary | ICD-10-CM | POA: Insufficient documentation

## 2017-04-24 DIAGNOSIS — Y929 Unspecified place or not applicable: Secondary | ICD-10-CM | POA: Diagnosis not present

## 2017-04-24 DIAGNOSIS — Z7984 Long term (current) use of oral hypoglycemic drugs: Secondary | ICD-10-CM | POA: Insufficient documentation

## 2017-04-24 DIAGNOSIS — Y9389 Activity, other specified: Secondary | ICD-10-CM | POA: Diagnosis not present

## 2017-04-24 DIAGNOSIS — Z87891 Personal history of nicotine dependence: Secondary | ICD-10-CM | POA: Diagnosis not present

## 2017-04-24 DIAGNOSIS — E119 Type 2 diabetes mellitus without complications: Secondary | ICD-10-CM | POA: Diagnosis not present

## 2017-04-24 DIAGNOSIS — S161XXA Strain of muscle, fascia and tendon at neck level, initial encounter: Secondary | ICD-10-CM | POA: Insufficient documentation

## 2017-04-24 DIAGNOSIS — S199XXA Unspecified injury of neck, initial encounter: Secondary | ICD-10-CM | POA: Diagnosis present

## 2017-04-24 MED ORDER — ACETAMINOPHEN 325 MG PO TABS
650.0000 mg | ORAL_TABLET | Freq: Once | ORAL | Status: AC
  Administered 2017-04-24: 650 mg via ORAL
  Filled 2017-04-24: qty 2

## 2017-04-24 MED ORDER — METHOCARBAMOL 500 MG PO TABS: 500.0000 mg | ORAL_TABLET | Freq: Two times a day (BID) | ORAL | 0 refills | Status: DC

## 2017-04-24 MED ORDER — NAPROXEN 375 MG PO TABS: 375.0000 mg | ORAL_TABLET | Freq: Two times a day (BID) | ORAL | 0 refills | Status: DC

## 2017-04-24 NOTE — Discharge Instructions (Signed)
Take Robaxin as prescribed. This medication will make you drowsy so do not drive or drink alcohol when taking it.  He can take the Naprosyn as directed.  If the Naprosyn does not work you can alternate Tylenol and ibuprofen.  Do not take the Naprosyn is the same time as the Tylenol or the ibuprofen.  Follow-up with your primary care doctor in the next 24-48 hours for further evaluation.  Return to the Emergency Department immediately for any worsening back or neck pain, difficulty walking, numbness/weaknss of your arms or legs, urinary or bowel accidents, swelling of the neck, fever or any other worsening or concerning symptoms.

## 2017-04-24 NOTE — ED Triage Notes (Addendum)
C/o pain to right side of neck after lifting trays at work 04/20/16-states she "felt a pop"-NAD-steady gait

## 2017-04-24 NOTE — ED Provider Notes (Signed)
[facility] EMERGENCY DEPARTMENT Provider Note   CSN: [id number] Arrival date & time: 04/24/17  2026     History   Chief Complaint Chief Complaint  Patient presents with  . Neck Pain    HPI Brooke BurtonJennifer Peterson is a 47 y.o. 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 Patient reports that she was lifting some heavy trays and heard a "pop" sensation. Since that she has experienced some right sided neck pain. She has been taking tylenol with minimal improvement. Repotrs pain is worsened with trying to turn her head ot the side. No trauma or fall noted. Has been ambulating without difficulty. Denies fevers, weight loss, numbness/weakness of upper and lower extremities, bowel/bladder incontinence, saddle anesthesia, history of back surgery, history of IVDA.   The history is provided by the patient.    Past Medical History:  Diagnosis Date  . Cellulitis 07/2011  . Diabetes mellitus    dx 2016  . GERD (gastroesophageal reflux disease)     Patient Active Problem List   Diagnosis Date Noted  . Bilateral carpal tunnel syndrome 07/06/2016  . Left elbow pain 02/17/2016  . Tobacco abuse 10/29/2015  . Injury of right hand 05/19/2015  . Cervical disc disorder with radiculopathy of cervical region 10/10/2013  . Type II or unspecified type diabetes mellitus without mention of complication, uncontrolled 04/07/2013  . Pyelonephritis 04/06/2013  . RLQ abdominal pain 04/06/2013  . Fever 09/07/2012  . Headache(784.0) 09/07/2012  . Cellulitis 08/05/2011  . Peripheral edema 08/05/2011  . GERD (gastroesophageal reflux disease) 08/05/2011  . Obesity 08/05/2011    Past Surgical History:  Procedure Laterality Date  . ABLATION    . CARPAL TUNNEL RELEASE Left 09/13/2016   Procedure: LEFT CARPAL TUNNEL RELEASE;  Surgeon: Cindee SaltKuzma, Gary, MD;  Location: [facility] OR;  Service: Orthopedics;  Laterality: Left;  . CHOLECYSTECTOMY    . KNEE  ARTHROSCOPY    . LAPAROSCOPIC TUBAL LIGATION  04/29/2011   Procedure: LAPAROSCOPIC TUBAL LIGATION;  Surgeon: Turner Danielsavid C Lowe, MD;  Location: [facility];  Service: Gynecology;  Laterality: Bilateral;  with Filshie Clips  . ROTATOR CUFF REPAIR    . TUBAL LIGATION      OB History    No data available       Home Medications    Prior to Admission medications   Medication Sig Start Date End Date Taking? Authorizing Provider  acetaminophen (TYLENOL) 500 MG tablet Take 1,000 mg by mouth every 8 (eight) hours as needed for mild pain or headache.    [provider]  dicyclomine (BENTYL) 20 MG tablet Take 1 tablet (20 mg total) by mouth 2 (two) times daily. For abdominal pain Patient not taking: Reported on 09/07/2016 06/08/15   Antony MaduraHumes, Kelly, PA-C  furosemide (LASIX) 20 MG tablet Take 1 tablet (20 mg total) by mouth daily. Patient taking differently: Take 20 mg by mouth daily as needed for fluid or edema.  05/14/13   Doris CheadleAdvani, Deepak, MD  HYDROcodone-acetaminophen (NORCO) 5-325 MG tablet Take 1 tablet by mouth every 6 (six) hours as needed for moderate pain. 09/13/16   Cindee SaltKuzma, Gary, MD  insulin NPH-regular (NOVOLIN 70/30) (70-30) 100 UNIT/ML injection Inject 13 Units into the skin 2 (two) times daily with a meal. Patient not taking: Reported on 09/07/2016 04/08/13   Dhungel, Theda BelfastNishant, MD  loratadine (CLARITIN) 10 MG tablet Take 10 mg by mouth daily.    [provider]  metFORMIN (GLUCOPHAGE) 1000 MG tablet  Take 1,000 mg by mouth 2 (two) times daily with a meal.  05/21/16   [provider]  methocarbamol (ROBAXIN) 500 MG tablet Take 1 tablet (500 mg total) by mouth 2 (two) times daily. 04/24/17   Maxwell Caul, PA-C  naproxen (NAPROSYN) 375 MG tablet Take 1 tablet (375 mg total) by mouth 2 (two) times daily. 04/24/17   Maxwell Caul, PA-C  omeprazole (PRILOSEC OTC) 20 MG tablet Take 20 mg by mouth daily.    [provider]  polyethylene glycol powder (MIRALAX) powder Take 17 g  by mouth daily. Patient not taking: Reported on 09/07/2016 06/08/15   Antony Madura, PA-C  predniSONE (DELTASONE) 10 MG tablet 6 tabs po day 1, 5 tabs po day 2, 4 tabs po day 3, 3 tabs po day 4, 2 tabs po day 5, 1 tab po day 6 Patient not taking: Reported on 09/07/2016 03/09/16   Lenda Kelp, MD  traMADol (ULTRAM) 50 MG tablet Take 1 tablet (50 mg total) by mouth every 6 (six) hours as needed for severe pain. Patient not taking: Reported on 09/07/2016 07/05/16   Lenda Kelp, MD    Family History Family History  Problem Relation Age of Onset  . Cancer Mother   . Diabetes Mother   . Hypertension Mother     Social History Social History   Tobacco Use  . Smoking status: Former Smoker    Packs/day: 0.50    Years: 2.00    Pack years: 1.00    Types: Cigarettes    Last attempt to quit: 07/22/2011    Years since quitting: 5.7  . Smokeless tobacco: Never Used  Substance Use Topics  . Alcohol use: Yes    Alcohol/week: 0.0 oz    Comment: occ  . Drug use: No     Allergies   Vancomycin and Adhesive [tape]   Review of Systems Review of Systems  Constitutional: Negative for fever.  Respiratory: Negative for shortness of breath.   Cardiovascular: Negative for chest pain.  Gastrointestinal: Negative for abdominal pain, nausea and vomiting.  Musculoskeletal: Positive for neck pain. Negative for back pain.  Neurological: Negative for weakness, numbness and headaches.     Physical Exam Updated Vital Signs BP 116/70   Pulse 85   Temp 98.2 F (36.8 C) (Oral)   Resp (!) 22   Ht 5\' 1"  (1.549 m)   Wt 128.5 kg (283 lb 4.7 oz)   SpO2 99%   BMI 53.53 kg/m   Physical Exam  Neck:    Full flexion/extension of neck fully intact. Patient with subjective pain with lateral movement. Diffuse muscular tenderness to the right sided paraspinal muscles of the cervical region. No bony midline tenderness. No deformities or crepitus.   Neurological:  Follows commands, Moves all  extremities  5/5 strength to BUE and BLE  Sensation intact throughout all major nerve distributions Normal gait     ED Treatments / Results  Labs (all labs ordered are listed, but only abnormal results are displayed) Labs Reviewed - No data to display  EKG  EKG Interpretation None       Radiology No results found.  Procedures Procedures (including critical care time)  Medications Ordered in ED Medications  acetaminophen (TYLENOL) tablet 650 mg (650 mg Oral Given 04/24/17 2332)     Initial Impression / Assessment and Plan / ED Course  I have reviewed the triage vital signs and the nursing notes.  Pertinent labs & imaging results that were  available during my care of the patient were reviewed by me and considered in my medical decision making (see chart for details).     47 y.o. F who presents with neck pain that began 4 days ago whe she was lifting some heavy trays. No trauma or fall noted. Ambulating without difficulty. Patient is afebrile, non-toxic appearing, sitting comfortably on examination table. Vital signs reviewed and stable. No neuro deficits noted on exam. Physical exam shows diffuse muscular tendenress to the right paraspinal muscles of the cervical region. No midline bony tenderness. Reports some subjective pain with lateral movement. Suspect muscular strain based on history/physical. No indication for imaging at this time given lack of trauma.  History/physical exam are not concerning for fracture, spinal abscess, cauda equina. Plan to treat with NSAIDs and muscle relaxer. Patient instructed to follow-up with PCP in 24-48 hours for further evaluation. Patient had ample opportunity for questions and discussion. All patient's questions were answered with full understanding. Strict return precautions discussed. Patient expresses understanding and agreement to plan.     Final Clinical Impressions(s) / ED Diagnoses   Final diagnoses:  Acute strain of neck muscle,  initial encounter    ED Discharge Orders        Ordered    methocarbamol (ROBAXIN) 500 MG tablet  2 times daily     04/24/17 2332    naproxen (NAPROSYN) 375 MG tablet  2 times daily     04/24/17 2332       Maxwell Caul, PA-C 04/25/17 0442    Paula Libra, MD 04/25/17 [phone]

## 2017-05-11 ENCOUNTER — Other Ambulatory Visit: Payer: Self-pay

## 2017-05-11 ENCOUNTER — Encounter (HOSPITAL_BASED_OUTPATIENT_CLINIC_OR_DEPARTMENT_OTHER): Payer: Self-pay | Admitting: *Deleted

## 2017-05-11 ENCOUNTER — Emergency Department (HOSPITAL_BASED_OUTPATIENT_CLINIC_OR_DEPARTMENT_OTHER)
Admission: EM | Admit: 2017-05-11 | Discharge: 2017-05-12 | Disposition: A | Discharge status: Home / Self Care | Payer: BLUE CROSS/BLUE SHIELD | Attending: Emergency Medicine | Admitting: Emergency Medicine

## 2017-05-11 DIAGNOSIS — Z79899 Other long term (current) drug therapy: Secondary | ICD-10-CM | POA: Diagnosis not present

## 2017-05-11 DIAGNOSIS — R109 Unspecified abdominal pain: Secondary | ICD-10-CM | POA: Diagnosis not present

## 2017-05-11 DIAGNOSIS — Z87891 Personal history of nicotine dependence: Secondary | ICD-10-CM | POA: Diagnosis not present

## 2017-05-11 DIAGNOSIS — E119 Type 2 diabetes mellitus without complications: Secondary | ICD-10-CM | POA: Diagnosis not present

## 2017-05-11 DIAGNOSIS — Z794 Long term (current) use of insulin: Secondary | ICD-10-CM | POA: Insufficient documentation

## 2017-05-11 LAB — URINALYSIS, ROUTINE W REFLEX MICROSCOPIC
Bilirubin Urine: NEGATIVE
Glucose, UA: >=500 mg/dL — AB
Ketones, ur: NEGATIVE mg/dL
Nitrite: NEGATIVE
Protein, ur: NEGATIVE mg/dL
Specific Gravity, Urine: 1.025 (ref 1.005–1.030)
pH: 6 (ref 5.0–8.0)

## 2017-05-11 LAB — URINALYSIS, MICROSCOPIC (REFLEX)

## 2017-05-12 ENCOUNTER — Emergency Department (HOSPITAL_COMMUNITY): Payer: BLUE CROSS/BLUE SHIELD

## 2017-05-12 ENCOUNTER — Emergency Department (HOSPITAL_BASED_OUTPATIENT_CLINIC_OR_DEPARTMENT_OTHER): Payer: BLUE CROSS/BLUE SHIELD

## 2017-05-12 MED ORDER — ONDANSETRON HCL 4 MG/2ML IJ SOLN
4.0000 mg | Freq: Once | INTRAMUSCULAR | Status: AC
Start: 2017-05-12 — End: 2017-05-12
  Administered 2017-05-12: 4 mg via INTRAVENOUS
  Filled 2017-05-12: qty 2

## 2017-05-12 MED ORDER — SODIUM CHLORIDE 0.9 % IV BOLUS (SEPSIS)
500.0000 mL | Freq: Once | INTRAVENOUS | Status: AC
  Administered 2017-05-12: 500 mL via INTRAVENOUS

## 2017-05-12 MED ORDER — ONDANSETRON 8 MG PO TBDP: 8.0000 mg | ORAL_TABLET | Freq: Three times a day (TID) | ORAL | 0 refills | Status: DC | PRN

## 2017-05-12 MED ORDER — MORPHINE SULFATE (PF) 4 MG/ML IV SOLN
4.0000 mg | Freq: Once | INTRAVENOUS | Status: AC
  Administered 2017-05-12: 4 mg via INTRAVENOUS
  Filled 2017-05-12: qty 1

## 2017-05-12 NOTE — ED Provider Notes (Signed)
[facility] EMERGENCY DEPARTMENT Provider Note   CSN: [id number] Arrival date & time: 05/11/17  2232     History   Chief Complaint Chief Complaint  Patient presents with  . Abdominal Pain    HPI [name] is a 47 y.o. female.  HPI Patient is a 47 year old female presents to the emergency department with sharp right lower quadrant abdominal pain with some radiation to.  No prior diagnosis of kidney stone.  She states she has had blood in her urine before.  Did not notice any obvious blood today.  Denies upper abdominal pain.  Reports nausea without vomiting.  Denies diarrhea.  No fevers or chills.  No upper respiratory symptoms.  Denies diarrhea.  No vaginal complaints.  Symptoms are mild in severity   Past Medical History:  Diagnosis Date  . Cellulitis 07/2011  . Diabetes mellitus    dx 2016  . GERD (gastroesophageal reflux disease)     Patient Active Problem List   Diagnosis Date Noted  . Bilateral carpal tunnel syndrome 07/06/2016  . Left elbow pain 02/17/2016  . Tobacco abuse 10/29/2015  . Injury of right hand 05/19/2015  . Cervical disc disorder with radiculopathy of cervical region 10/10/2013  . Type II or unspecified type diabetes mellitus without mention of complication, uncontrolled 04/07/2013  . Pyelonephritis 04/06/2013  . RLQ abdominal pain 04/06/2013  . Fever 09/07/2012  . Headache(784.0) 09/07/2012  . Cellulitis 08/05/2011  . Peripheral edema 08/05/2011  . GERD (gastroesophageal reflux disease) 08/05/2011  . Obesity 08/05/2011    Past Surgical History:  Procedure Laterality Date  . ABLATION    . CARPAL TUNNEL RELEASE Left 09/13/2016   Procedure: LEFT CARPAL TUNNEL RELEASE;  Surgeon: Cindee SaltKuzma, Gary, MD;  Location: [facility] OR;  Service: Orthopedics;  Laterality: Left;  . CHOLECYSTECTOMY    . KNEE ARTHROSCOPY    . LAPAROSCOPIC TUBAL LIGATION  04/29/2011   Procedure: LAPAROSCOPIC TUBAL LIGATION;  Surgeon: Turner Danielsavid C Lowe, MD;  Location: [facility] ORS;   Service: Gynecology;  Laterality: Bilateral;  with Filshie Clips  . ROTATOR CUFF REPAIR    . TUBAL LIGATION      OB History    No data available       Home Medications    Prior to Admission medications   Medication Sig Start Date End Date Taking? Authorizing Provider  acetaminophen (TYLENOL) 500 MG tablet Take 1,000 mg by mouth every 8 (eight) hours as needed for mild pain or headache.    [provider]  furosemide (LASIX) 20 MG tablet Take 1 tablet (20 mg total) by mouth daily. Patient taking differently: Take 20 mg by mouth daily as needed for fluid or edema.  05/14/13   Doris CheadleAdvani, Deepak, MD  HYDROcodone-acetaminophen (NORCO) 5-325 MG tablet Take 1 tablet by mouth every 6 (six) hours as needed for moderate pain. 09/13/16   Cindee SaltKuzma, Gary, MD  insulin NPH-regular (NOVOLIN 70/30) (70-30) 100 UNIT/ML injection Inject 13 Units into the skin 2 (two) times daily with a meal. Patient not taking: Reported on 09/07/2016 04/08/13   Dhungel, Theda BelfastNishant, MD  loratadine (CLARITIN) 10 MG tablet Take 10 mg by mouth daily.    [provider]  metFORMIN (GLUCOPHAGE) 1000 MG tablet Take 1,000 mg by mouth 2 (two) times daily with a meal.  05/21/16   [provider]  omeprazole (PRILOSEC OTC) 20 MG tablet Take 20 mg by mouth daily.    [provider]  ondansetron (ZOFRAN ODT) 8 MG disintegrating tablet Take 1 tablet (8  mg total) by mouth every 8 (eight) hours as needed for nausea or vomiting. 05/12/17   Azalia Bilis, MD  predniSONE (DELTASONE) 10 MG tablet 6 tabs po day 1, 5 tabs po day 2, 4 tabs po day 3, 3 tabs po day 4, 2 tabs po day 5, 1 tab po day 6 Patient not taking: Reported on 09/07/2016 03/09/16   Lenda Kelp, MD  traMADol (ULTRAM) 50 MG tablet Take 1 tablet (50 mg total) by mouth every 6 (six) hours as needed for severe pain. Patient not taking: Reported on 09/07/2016 07/05/16   Lenda Kelp, MD    Family History Family History  Problem Relation Age of Onset    . Cancer Mother   . Diabetes Mother   . Hypertension Mother     Social History Social History   Tobacco Use  . Smoking status: Former Smoker    Packs/day: 0.50    Years: 2.00    Pack years: 1.00    Types: Cigarettes    Last attempt to quit: 07/22/2011    Years since quitting: 5.8  . Smokeless tobacco: Never Used  Substance Use Topics  . Alcohol use: Yes    Alcohol/week: 0.0 oz    Comment: occ  . Drug use: No     Allergies   Vancomycin and Adhesive [tape]   Review of Systems Review of Systems  All other systems reviewed and are negative.    Physical Exam Updated Vital Signs BP (!) 106/45 (BP Location: Left Arm)   Pulse 92   Temp 98.2 F (36.8 C) (Oral)   Resp 16   Ht 5\' 1"  (1.549 m)   Wt 128.4 kg (283 lb)   SpO2 96%   BMI 53.47 kg/m   Physical Exam  Constitutional: She is oriented to person, place, and time. She appears well-developed and well-nourished. No distress.  HENT:  Head: Normocephalic and atraumatic.  Eyes: EOM are normal.  Neck: Normal range of motion.  Cardiovascular: Normal rate, regular rhythm and normal heart sounds.  Pulmonary/Chest: Effort normal and breath sounds normal.  Abdominal: Soft. She exhibits no distension. There is no tenderness.  Musculoskeletal: Normal range of motion.  Neurological: She is alert and oriented to person, place, and time.  Skin: Skin is warm and dry.  Psychiatric: She has a normal mood and affect. Judgment normal.  Nursing note and vitals reviewed.    ED Treatments / Results  Labs (all labs ordered are listed, but only abnormal results are displayed) Labs Reviewed  URINALYSIS, ROUTINE W REFLEX MICROSCOPIC - Abnormal; Notable for the following components:      Result Value   Glucose, UA >=500 (*)    Hgb urine dipstick TRACE (*)    Leukocytes, UA SMALL (*)    All other components within normal limits  URINALYSIS, MICROSCOPIC (REFLEX) - Abnormal; Notable for the following components:   Bacteria, UA  MANY (*)    Squamous Epithelial / LPF 6-30 (*)    All other components within normal limits  URINE CULTURE    EKG  EKG Interpretation None       Radiology Ct Renal Stone Study  Result Date: 05/12/2017 CLINICAL DATA:  Right lower quadrant and flank pain today. EXAM: CT ABDOMEN AND PELVIS WITHOUT CONTRAST TECHNIQUE: Multidetector CT imaging of the abdomen and pelvis was performed following the standard protocol without IV contrast. COMPARISON:  05/05/2014 FINDINGS: Lower chest: Lung bases are clear. Hepatobiliary: Mild diffuse fatty infiltration of the liver. No focal lesions  identified on noncontrast imaging. Surgical absence of the gallbladder. No bile duct dilatation. Pancreas: Unremarkable. No pancreatic ductal dilatation or surrounding inflammatory changes. Spleen: Normal in size without focal abnormality. Adrenals/Urinary Tract: Adrenal glands are unremarkable. Kidneys are normal, without renal calculi, focal lesion, or hydronephrosis. Bladder is unremarkable. Stomach/Bowel: Stomach, small bowel, and colon are mostly decompressed with scattered stool throughout the colon. No inflammatory infiltration. Appendix is normal. Vascular/Lymphatic: No significant vascular findings are present. No enlarged abdominal or pelvic lymph nodes. Reproductive: Uterus and bilateral adnexa are unremarkable. Surgical clips consistent with tubal ligations. Other: No free air or free fluid in the abdomen. Minimal periumbilical hernia containing fat. Musculoskeletal: No acute or significant osseous findings. IMPRESSION: 1. No acute process demonstrated in the abdomen or pelvis. No evidence of renal or ureteral stone or obstruction. Appendix is normal. 2. Mild diffuse fatty infiltration of the liver. Electronically Signed   By: Burman Nieves M.D.   On: 05/12/2017 00:57    Procedures Procedures (including critical care time)  Medications Ordered in ED Medications  morphine 4 MG/ML injection 4 mg (4 mg  Intravenous Given 05/12/17 0125)  ondansetron (ZOFRAN) injection 4 mg (4 mg Intravenous Given 05/12/17 0124)  sodium chloride 0.9 % bolus 500 mL (500 mLs Intravenous New Bag/Given 05/12/17 0125)     Initial Impression / Assessment and Plan / ED Course  I have reviewed the triage vital signs and the nursing notes.  Pertinent labs & imaging results that were available during my care of the patient were reviewed by me and considered in my medical decision making (see chart for details).     Suspected right ureteral stone.  Blood in the urine but no evidence of stone at this time.  Could represent recently passed stone.  Denies dysuria, urinary frequency, urinary hesitancy.  Urine sent for culture.  Doubt urinary tract infection/pyelonephritis.  Patient feels much better.  Discharged home in good condition.  Patient understands return to the ER for new or worsening symptoms.  No upper or right upper quadrant abdominal pain  Final Clinical Impressions(s) / ED Diagnoses   Final diagnoses:  Acute abdominal pain    ED Discharge Orders        Ordered    ondansetron (ZOFRAN ODT) 8 MG disintegrating tablet  Every 8 hours PRN     05/12/17 0214       Azalia Bilis, MD 05/12/17 [phone]

## 2017-05-12 NOTE — ED Notes (Signed)
Pt verbalizes understanding of d/c instructions and denies any further needs at this time. 

## 2017-05-12 NOTE — ED Notes (Signed)
Pt in CT.

## 2017-05-12 NOTE — ED Notes (Addendum)
Pt presents with pain in the same region that she has frequently had pain in the past.  Pt readily answers "yes" to most of the nurses questions prior to finishing the question.  Pt appears quite comfortable on bed, no n/v/d appreciated.

## 2017-05-13 LAB — URINE CULTURE: Culture: >=100000 — AB

## 2017-05-14 ENCOUNTER — Telehealth: Payer: Self-pay

## 2017-05-14 NOTE — Progress Notes (Signed)
ED Antimicrobial Stewardship Positive Culture Follow Up   [name] is an 47 y.o. female who presented to [facility]2017 with a chief complaint of  Chief Complaint  Patient presents with  . Abdominal Pain    Recent Results (from the past 720 hour(s))  Urine culture     Status: Abnormal   Collection Time: 05/11/17 10:32 PM  Result Value Ref Range Status   Specimen Description URINE, RANDOM  Final   Special Requests NONE  Final   Culture (A)  Final    >=100,000 COLONIES/mL GROUP B STREP(S.AGALACTIAE)ISOLATED TESTING AGAINST S. AGALACTIAE NOT ROUTINELY PERFORMED DUE TO PREDICTABILITY OF AMP/PEN/VAN SUSCEPTIBILITY. Performed at [facility], [address] [place], [place] [address]    Report Status 05/13/2017 FINAL  Final    []  Treated with N/A, organism resistant to prescribed antimicrobial [x]  Patient discharged originally without antimicrobial agent and treatment is now indicated  New antibiotic prescription: If pt symptomatic, start amoxicillin 500mg  PO TID x 7 days  ED Provider: Eyvonne MechanicJeffrey Hedges, PA   Mayer Vondrak, Drake Leachachel Lynn 05/14/2017, 10:10 AM IClinical Pharmacist Phone# [phone]

## 2017-05-14 NOTE — Telephone Encounter (Signed)
Post ED Visit - Positive Culture Follow-up: Unsuccessful Patient Follow-up  Culture assessed and recommendations reviewed by:  []  Enzo BiNathan Batchelder, Pharm.D. []  Celedonio MiyamotoJeremy Frens, Pharm.D., BCPS AQ-ID []  Garvin FilaMike Maccia, Pharm.D., BCPS []  Georgina PillionElizabeth Martin, Pharm.D., BCPS []  RomulusMinh Pham, [address]Pharm[address]D., BCPS, AAHIVP []  Estella HuskMichelle Turner, Pharm.D., BCPS, AAHIVP [x]  Lysle Pearlachel Rumbarger, PharmD, BCPS []  Casilda Carlsaylor Stone, PharmD, BCPS []  Pollyann SamplesAndy Johnston, PharmD, BCPS  Positive urine culture  Needs symptom check   May need abx [x]  Patient discharged without antimicrobial prescription and treatment is now indicated []  Organism is resistant to prescribed ED discharge antimicrobial []  Patient with positive blood cultures   Unable to contact patient after 3 attempts, letter will be sent to address on file  Jerry CarasCullom, Cam Dauphin Burnett 05/14/2017, 10:20 AM

## 2017-05-15 ENCOUNTER — Encounter (HOSPITAL_COMMUNITY): Payer: Self-pay | Admitting: Emergency Medicine

## 2017-05-15 ENCOUNTER — Emergency Department (HOSPITAL_COMMUNITY)
Admission: EM | Admit: 2017-05-15 | Discharge: 2017-05-15 | Disposition: A | Discharge status: Home / Self Care | Payer: BLUE CROSS/BLUE SHIELD | Attending: Emergency Medicine | Admitting: Emergency Medicine

## 2017-05-15 ENCOUNTER — Emergency Department (HOSPITAL_COMMUNITY): Payer: BLUE CROSS/BLUE SHIELD

## 2017-05-15 DIAGNOSIS — Z7984 Long term (current) use of oral hypoglycemic drugs: Secondary | ICD-10-CM | POA: Diagnosis not present

## 2017-05-15 DIAGNOSIS — Z87891 Personal history of nicotine dependence: Secondary | ICD-10-CM | POA: Diagnosis not present

## 2017-05-15 DIAGNOSIS — R1031 Right lower quadrant pain: Secondary | ICD-10-CM | POA: Diagnosis present

## 2017-05-15 DIAGNOSIS — Z79899 Other long term (current) drug therapy: Secondary | ICD-10-CM | POA: Diagnosis not present

## 2017-05-15 DIAGNOSIS — N390 Urinary tract infection, site not specified: Secondary | ICD-10-CM | POA: Diagnosis not present

## 2017-05-15 DIAGNOSIS — E119 Type 2 diabetes mellitus without complications: Secondary | ICD-10-CM | POA: Insufficient documentation

## 2017-05-15 LAB — URINALYSIS, ROUTINE W REFLEX MICROSCOPIC
Bilirubin Urine: NEGATIVE
Glucose, UA: 50 mg/dL — AB
Ketones, ur: NEGATIVE mg/dL
Nitrite: NEGATIVE
Protein, ur: NEGATIVE mg/dL
Specific Gravity, Urine: >1.046 — ABNORMAL HIGH (ref 1.005–1.030)
pH: 5 (ref 5.0–8.0)

## 2017-05-15 LAB — COMPREHENSIVE METABOLIC PANEL
ALT: 19 U/L (ref 14–54)
AST: 33 U/L (ref 15–41)
Albumin: 3.4 g/dL — ABNORMAL LOW (ref 3.5–5.0)
Alkaline Phosphatase: 97 U/L (ref 38–126)
Anion gap: 9 (ref 5–15)
BUN: 10 mg/dL (ref 6–20)
CO2: 24 mmol/L (ref 22–32)
Calcium: 8.6 mg/dL — ABNORMAL LOW (ref 8.9–10.3)
Chloride: 97 mmol/L — ABNORMAL LOW (ref 101–111)
Creatinine, Ser: 0.59 mg/dL (ref 0.44–1.00)
GFR calc Af Amer: >60 mL/min (ref >60)
GFR calc non Af Amer: >60 mL/min (ref >60)
Glucose, Bld: 249 mg/dL — ABNORMAL HIGH (ref 65–99)
Potassium: 4.4 mmol/L (ref 3.5–5.1)
Sodium: 130 mmol/L — ABNORMAL LOW (ref 135–145)
Total Bilirubin: 1.4 mg/dL — ABNORMAL HIGH (ref 0.3–1.2)
Total Protein: 7.2 g/dL (ref 6.5–8.1)

## 2017-05-15 LAB — CBC
HCT: 41.6 % (ref 36.0–46.0)
Hemoglobin: 14.1 g/dL (ref 12.0–15.0)
MCH: 28.1 pg (ref 26.0–34.0)
MCHC: 33.9 g/dL (ref 30.0–36.0)
MCV: 83 fL (ref 78.0–100.0)
Platelets: 373 10*3/uL (ref 150–400)
RBC: 5.01 MIL/uL (ref 3.87–5.11)
RDW: 13.8 % (ref 11.5–15.5)
WBC: 14.5 10*3/uL — ABNORMAL HIGH (ref 4.0–10.5)

## 2017-05-15 LAB — I-STAT BETA HCG BLOOD, ED (MC, WL, AP ONLY): I-stat hCG, quantitative: <5 m[IU]/mL (ref <5)

## 2017-05-15 LAB — LIPASE, BLOOD: Lipase: 37 U/L (ref 11–51)

## 2017-05-15 MED ORDER — ONDANSETRON HCL 4 MG/2ML IJ SOLN
4.0000 mg | Freq: Once | INTRAMUSCULAR | Status: AC
  Administered 2017-05-15: 4 mg via INTRAVENOUS
  Filled 2017-05-15: qty 2

## 2017-05-15 MED ORDER — CEPHALEXIN 500 MG PO CAPS
500.0000 mg | ORAL_CAPSULE | Freq: Four times a day (QID) | ORAL | 0 refills | Status: DC
Start: 2017-05-15 — End: 2018-01-03

## 2017-05-15 MED ORDER — IOPAMIDOL (ISOVUE-300) INJECTION 61%
INTRAVENOUS | Status: AC
  Filled 2017-05-15: qty 100

## 2017-05-15 MED ORDER — OXYCODONE-ACETAMINOPHEN 5-325 MG PO TABS: 1.0000 | ORAL_TABLET | ORAL | 0 refills | Status: DC | PRN

## 2017-05-15 MED ORDER — MORPHINE SULFATE (PF) 4 MG/ML IV SOLN
4.0000 mg | Freq: Once | INTRAVENOUS | Status: AC
Start: 2017-05-15 — End: 2017-05-15
  Administered 2017-05-15: 4 mg via INTRAVENOUS
  Filled 2017-05-15: qty 1

## 2017-05-15 MED ORDER — IOPAMIDOL (ISOVUE-300) INJECTION 61%
100.0000 mL | Freq: Once | INTRAVENOUS | Status: AC | PRN
  Administered 2017-05-15: 100 mL via INTRAVENOUS

## 2017-05-15 MED ORDER — DEXTROSE 5 % IV SOLN
1.0000 g | INTRAVENOUS | Status: DC
  Administered 2017-05-15: 1 g via INTRAVENOUS
  Filled 2017-05-15: qty 10

## 2017-05-15 NOTE — ED Provider Notes (Signed)
[facility]-EMERGENCY DEPT Provider Note   CSN: [id number] Arrival date & time: 05/15/17  1132     History   Chief Complaint Chief Complaint  Patient presents with  . Abdominal Pain    HPI [name] is a 47 y.o. female.  48 year old female presents with right lower quadrant pain that radiates to the flank times several days.  [facility] for similar symptoms 4 days ago and had a negative renal CT.  Continues to note pain which is persistent and worse with movement.  She has had nonbilious emesis without diarrhea.  No vaginal bleeding or discharge.  No hematuria dysuria.  Saw in urgent care today who sent her here for evaluation of possible appendicitis.  Denies any prior history of ovarian cyst      Past Medical History:  Diagnosis Date  . Cellulitis 07/2011  . Diabetes mellitus    dx 2016  . GERD (gastroesophageal reflux disease)     Patient Active Problem List   Diagnosis Date Noted  . Bilateral carpal tunnel syndrome 07/06/2016  . Left elbow pain 02/17/2016  . Tobacco abuse 10/29/2015  . Injury of right hand 05/19/2015  . Cervical disc disorder with radiculopathy of cervical region 10/10/2013  . Type II or unspecified type diabetes mellitus without mention of complication, uncontrolled 04/07/2013  . Pyelonephritis 04/06/2013  . RLQ abdominal pain 04/06/2013  . Fever 09/07/2012  . Headache(784.0) 09/07/2012  . Cellulitis 08/05/2011  . Peripheral edema 08/05/2011  . GERD (gastroesophageal reflux disease) 08/05/2011  . Obesity 08/05/2011    Past Surgical History:  Procedure Laterality Date  . ABLATION    . CARPAL TUNNEL RELEASE Left 09/13/2016   Procedure: LEFT CARPAL TUNNEL RELEASE;  Surgeon: Cindee Salt, MD;  Location: [facility] OR;  Service: Orthopedics;  Laterality: Left;  . CHOLECYSTECTOMY    . KNEE ARTHROSCOPY    . LAPAROSCOPIC TUBAL LIGATION  04/29/2011   Procedure: LAPAROSCOPIC TUBAL LIGATION;  Surgeon: Turner Daniels, MD;   Location: [facility] ORS;  Service: Gynecology;  Laterality: Bilateral;  with Filshie Clips  . ROTATOR CUFF REPAIR    . TUBAL LIGATION      OB History    No data available       Home Medications    Prior to Admission medications   Medication Sig Start Date End Date Taking? Authorizing Provider  acetaminophen (TYLENOL) 500 MG tablet Take 1,000 mg by mouth every 8 (eight) hours as needed for mild pain or headache.    [provider]  furosemide (LASIX) 20 MG tablet Take 1 tablet (20 mg total) by mouth daily. Patient taking differently: Take 20 mg by mouth daily as needed for fluid or edema.  05/14/13   Doris Cheadle, MD  HYDROcodone-acetaminophen (NORCO) 5-325 MG tablet Take 1 tablet by mouth every 6 (six) hours as needed for moderate pain. 09/13/16   Cindee Salt, MD  insulin NPH-regular (NOVOLIN 70/30) (70-30) 100 UNIT/ML injection Inject 13 Units into the skin 2 (two) times daily with a meal. Patient not taking: Reported on 09/07/2016 04/08/13   Dhungel, Theda Belfast, MD  loratadine (CLARITIN) 10 MG tablet Take 10 mg by mouth daily.    [provider]  metFORMIN (GLUCOPHAGE) 1000 MG tablet Take 1,000 mg by mouth 2 (two) times daily with a meal.  05/21/16   [provider]  omeprazole (PRILOSEC OTC) 20 MG tablet Take 20 mg by mouth daily.    [provider]  ondansetron (ZOFRAN ODT) 8  MG disintegrating tablet Take 1 tablet (8 mg total) by mouth every 8 (eight) hours as needed for nausea or vomiting. 05/12/17   Azalia Bilisampos, Kevin, MD  predniSONE (DELTASONE) 10 MG tablet 6 tabs po day 1, 5 tabs po day 2, 4 tabs po day 3, 3 tabs po day 4, 2 tabs po day 5, 1 tab po day 6 Patient not taking: Reported on 09/07/2016 03/09/16   Lenda KelpHudnall, Shane R, MD  traMADol (ULTRAM) 50 MG tablet Take 1 tablet (50 mg total) by mouth every 6 (six) hours as needed for severe pain. Patient not taking: Reported on 09/07/2016 07/05/16   Lenda KelpHudnall, Shane R, MD    Family History Family History  Problem  Relation Age of Onset  . Cancer Mother   . Diabetes Mother   . Hypertension Mother     Social History Social History   Tobacco Use  . Smoking status: Former Smoker    Packs/day: 0.50    Years: 2.00    Pack years: 1.00    Types: Cigarettes    Last attempt to quit: 07/22/2011    Years since quitting: 5.8  . Smokeless tobacco: Never Used  Substance Use Topics  . Alcohol use: Yes    Alcohol/week: 0.0 oz    Comment: occ  . Drug use: No     Allergies   Vancomycin and Adhesive [tape]   Review of Systems Review of Systems  All other systems reviewed and are negative.    Physical Exam Updated Vital Signs BP 126/72 (BP Location: Right Arm)   Pulse (!) 106   Temp 98.2 F (36.8 C) (Oral)   Resp 16   SpO2 97%   Physical Exam  Constitutional: She is oriented to person, place, and time. She appears well-developed and well-nourished.  Non-toxic appearance. No distress.  HENT:  Head: Normocephalic and atraumatic.  Eyes: Conjunctivae, EOM and lids are normal. Pupils are equal, round, and reactive to light.  Neck: Normal range of motion. Neck supple. No tracheal deviation present. No thyroid mass present.  Cardiovascular: Normal rate, regular rhythm and normal heart sounds. Exam reveals no gallop.  No murmur heard. Pulmonary/Chest: Effort normal and breath sounds normal. No stridor. No respiratory distress. She has no decreased breath sounds. She has no wheezes. She has no rhonchi. She has no rales.  Abdominal: Soft. Normal appearance and bowel sounds are normal. She exhibits no distension. There is tenderness in the right lower quadrant. There is guarding. There is no rigidity, no rebound and no CVA tenderness.    Musculoskeletal: Normal range of motion. She exhibits no edema or tenderness.  Neurological: She is alert and oriented to person, place, and time. She has normal strength. No cranial nerve deficit or sensory deficit. GCS eye subscore is 4. GCS verbal subscore is 5. GCS  motor subscore is 6.  Skin: Skin is warm and dry. No abrasion and no rash noted.  Psychiatric: She has a normal mood and affect. Her speech is normal and behavior is normal.  Nursing note and vitals reviewed.    ED Treatments / Results  Labs (all labs ordered are listed, but only abnormal results are displayed) Labs Reviewed  LIPASE, BLOOD  COMPREHENSIVE METABOLIC PANEL  CBC  URINALYSIS, ROUTINE W REFLEX MICROSCOPIC  I-STAT BETA HCG BLOOD, ED ([facility], [facility], [facility] ONLY)    EKG  EKG Interpretation None       Radiology No results found.  Procedures Procedures (including critical care time)  Medications Ordered in ED Medications  morphine 4 MG/ML injection 4 mg (not administered)  ondansetron (ZOFRAN) injection 4 mg (not administered)     Initial Impression / Assessment and Plan / ED Course  I have reviewed the triage vital signs and the nursing notes.  Pertinent labs & imaging results that were available during my care of the patient were reviewed by me and considered in my medical decision making (see chart for details).     Abdominal CT negative for appendicitis.  No ovarian pathology noted.  Urinalysis shows significant infection Rocephin IV given here. concern for possible pyelonephritis.  Stable for discharge  Final Clinical Impressions(s) / ED Diagnoses   Final diagnoses:  None    ED Discharge Orders    None       Lorre Nick, MD 05/15/17 1539

## 2017-05-15 NOTE — ED Triage Notes (Addendum)
Per EMS, patient from UC, c/o RLQ and right flank pain x4 days. Reports N/V. Denies urinary sx.

## 2017-05-15 NOTE — ED Notes (Signed)
Pt says she is "still feeling nauseated"

## 2017-05-15 NOTE — ED Notes (Signed)
Ambulatory and independent at discharge. Verbalized understanding of discharge instructions.

## 2017-05-15 NOTE — ED Notes (Signed)
Patient is aware we need urine, patient states she "just went before leaving urgent care."

## 2017-05-18 ENCOUNTER — Encounter (HOSPITAL_COMMUNITY): Payer: Self-pay

## 2017-05-18 ENCOUNTER — Other Ambulatory Visit: Payer: Self-pay

## 2017-05-18 ENCOUNTER — Emergency Department (HOSPITAL_COMMUNITY)
Admission: EM | Admit: 2017-05-18 | Discharge: 2017-05-18 | Disposition: A | Discharge status: Home / Self Care | Payer: BLUE CROSS/BLUE SHIELD | Attending: Emergency Medicine | Admitting: Emergency Medicine

## 2017-05-18 DIAGNOSIS — R112 Nausea with vomiting, unspecified: Secondary | ICD-10-CM

## 2017-05-18 DIAGNOSIS — E119 Type 2 diabetes mellitus without complications: Secondary | ICD-10-CM | POA: Insufficient documentation

## 2017-05-18 DIAGNOSIS — N10 Acute tubulo-interstitial nephritis: Secondary | ICD-10-CM | POA: Diagnosis not present

## 2017-05-18 DIAGNOSIS — Z79899 Other long term (current) drug therapy: Secondary | ICD-10-CM | POA: Insufficient documentation

## 2017-05-18 DIAGNOSIS — Z794 Long term (current) use of insulin: Secondary | ICD-10-CM | POA: Diagnosis not present

## 2017-05-18 DIAGNOSIS — Z87891 Personal history of nicotine dependence: Secondary | ICD-10-CM | POA: Insufficient documentation

## 2017-05-18 DIAGNOSIS — Z9049 Acquired absence of other specified parts of digestive tract: Secondary | ICD-10-CM | POA: Diagnosis not present

## 2017-05-18 DIAGNOSIS — N12 Tubulo-interstitial nephritis, not specified as acute or chronic: Secondary | ICD-10-CM

## 2017-05-18 DIAGNOSIS — R1031 Right lower quadrant pain: Secondary | ICD-10-CM | POA: Diagnosis present

## 2017-05-18 LAB — URINALYSIS, ROUTINE W REFLEX MICROSCOPIC
Bacteria, UA: NONE SEEN
Bilirubin Urine: NEGATIVE
Glucose, UA: >=500 mg/dL — AB
Ketones, ur: 5 mg/dL — AB
Nitrite: NEGATIVE
Protein, ur: NEGATIVE mg/dL
Specific Gravity, Urine: 1.039 — ABNORMAL HIGH (ref 1.005–1.030)
pH: 5 (ref 5.0–8.0)

## 2017-05-18 LAB — COMPREHENSIVE METABOLIC PANEL
ALT: 23 U/L (ref 14–54)
AST: 21 U/L (ref 15–41)
Albumin: 3.7 g/dL (ref 3.5–5.0)
Alkaline Phosphatase: 104 U/L (ref 38–126)
Anion gap: 8 (ref 5–15)
BUN: 9 mg/dL (ref 6–20)
CO2: 27 mmol/L (ref 22–32)
Calcium: 9 mg/dL (ref 8.9–10.3)
Chloride: 99 mmol/L — ABNORMAL LOW (ref 101–111)
Creatinine, Ser: 0.55 mg/dL (ref 0.44–1.00)
GFR calc Af Amer: >60 mL/min (ref >60)
GFR calc non Af Amer: >60 mL/min (ref >60)
Glucose, Bld: 265 mg/dL — ABNORMAL HIGH (ref 65–99)
Potassium: 3.5 mmol/L (ref 3.5–5.1)
Sodium: 134 mmol/L — ABNORMAL LOW (ref 135–145)
Total Bilirubin: 1 mg/dL (ref 0.3–1.2)
Total Protein: 7.7 g/dL (ref 6.5–8.1)

## 2017-05-18 LAB — CBC WITH DIFFERENTIAL/PLATELET
Basophils Absolute: 0 10*3/uL (ref 0.0–0.1)
Basophils Relative: 0 %
Eosinophils Absolute: 0.3 10*3/uL (ref 0.0–0.7)
Eosinophils Relative: 2 %
HCT: 40.3 % (ref 36.0–46.0)
Hemoglobin: 13.7 g/dL (ref 12.0–15.0)
Lymphocytes Relative: 25 %
Lymphs Abs: 2.9 10*3/uL (ref 0.7–4.0)
MCH: 28.4 pg (ref 26.0–34.0)
MCHC: 34 g/dL (ref 30.0–36.0)
MCV: 83.6 fL (ref 78.0–100.0)
Monocytes Absolute: 0.6 10*3/uL (ref 0.1–1.0)
Monocytes Relative: 5 %
Neutro Abs: 7.7 10*3/uL (ref 1.7–7.7)
Neutrophils Relative %: 68 %
Platelets: 342 10*3/uL (ref 150–400)
RBC: 4.82 MIL/uL (ref 3.87–5.11)
RDW: 13.6 % (ref 11.5–15.5)
WBC: 11.5 10*3/uL — ABNORMAL HIGH (ref 4.0–10.5)

## 2017-05-18 LAB — PREGNANCY, URINE: Preg Test, Ur: NEGATIVE

## 2017-05-18 MED ORDER — KETOROLAC TROMETHAMINE 30 MG/ML IJ SOLN
30.0000 mg | Freq: Once | INTRAMUSCULAR | Status: AC
Start: 2017-05-18 — End: 2017-05-18
  Administered 2017-05-18: 30 mg via INTRAVENOUS
  Filled 2017-05-18: qty 1

## 2017-05-18 MED ORDER — METOCLOPRAMIDE HCL 10 MG PO TABS: 10.0000 mg | ORAL_TABLET | Freq: Four times a day (QID) | ORAL | 0 refills | Status: DC | PRN

## 2017-05-18 MED ORDER — CEPHALEXIN 500 MG PO CAPS
500.0000 mg | ORAL_CAPSULE | Freq: Once | ORAL | Status: AC
  Administered 2017-05-18: 500 mg via ORAL
  Filled 2017-05-18: qty 1

## 2017-05-18 MED ORDER — SODIUM CHLORIDE 0.9 % IV BOLUS (SEPSIS)
1000.0000 mL | Freq: Once | INTRAVENOUS | Status: AC
  Administered 2017-05-18: 1000 mL via INTRAVENOUS

## 2017-05-18 MED ORDER — ONDANSETRON HCL 4 MG/2ML IJ SOLN
4.0000 mg | Freq: Once | INTRAMUSCULAR | Status: AC
  Administered 2017-05-18: 4 mg via INTRAVENOUS
  Filled 2017-05-18: qty 2

## 2017-05-18 MED ORDER — METOCLOPRAMIDE HCL 5 MG/ML IJ SOLN
10.0000 mg | Freq: Once | INTRAMUSCULAR | Status: AC
  Administered 2017-05-18: 10 mg via INTRAVENOUS
  Filled 2017-05-18: qty 2

## 2017-05-18 NOTE — ED Provider Notes (Signed)
[facility]-EMERGENCY DEPT Provider Note   CSN: [id number] Arrival date & time: 05/18/17  1040     History   Chief Complaint Chief Complaint  Patient presents with  . Back Pain  . Flank Pain  . Emesis    HPI  [name] is a 47 y.o. Female with a history of diabetes, GERD, who returns to the ED complaining of right flank pain and emesis.  Patient was seen in the ED 3 days prior, diagnosed with a UTI with concern for Pilo, treated with 1 dose of IV Rocephin and discharged with Keflex 4 times daily.  Patient reports her symptoms initially improved, but today she started having a few episodes of vomiting, and flank pain.  Patient was discharged with Zofran, but has not taken any since Tuesday.  Has not taken anything for pain at home either.  Patient has taken her antibiotics regularly on Tuesday and Wednesday, has not had any today.  Patient also reports some epigastric abdominal pain, no lower abdominal pain.  Patient denies any fevers at home.  Patient denies hematuria or dysuria.      Past Medical History:  Diagnosis Date  . Cellulitis 07/2011  . Diabetes mellitus    dx 2016  . GERD (gastroesophageal reflux disease)     Patient Active Problem List   Diagnosis Date Noted  . Bilateral carpal tunnel syndrome 07/06/2016  . Left elbow pain 02/17/2016  . Tobacco abuse 10/29/2015  . Injury of right hand 05/19/2015  . Cervical disc disorder with radiculopathy of cervical region 10/10/2013  . Type II or unspecified type diabetes mellitus without mention of complication, uncontrolled 04/07/2013  . Pyelonephritis 04/06/2013  . RLQ abdominal pain 04/06/2013  . Fever 09/07/2012  . Headache(784.0) 09/07/2012  . Cellulitis 08/05/2011  . Peripheral edema 08/05/2011  . GERD (gastroesophageal reflux disease) 08/05/2011  . Obesity 08/05/2011    Past Surgical History:  Procedure Laterality Date  . ABLATION    . CARPAL TUNNEL RELEASE Left 09/13/2016   Procedure: LEFT CARPAL TUNNEL RELEASE;  Surgeon: Cindee Salt, MD;  Location: [facility] OR;  Service: Orthopedics;  Laterality: Left;  . CHOLECYSTECTOMY    . KNEE ARTHROSCOPY    . LAPAROSCOPIC TUBAL LIGATION  04/29/2011   Procedure: LAPAROSCOPIC TUBAL LIGATION;  Surgeon: Turner Daniels, MD;  Location: [facility] ORS;  Service: Gynecology;  Laterality: Bilateral;  with Filshie Clips  . ROTATOR CUFF REPAIR    . TUBAL LIGATION      OB History    No data available       Home Medications    Prior to Admission medications   Medication Sig Start Date End Date Taking? Authorizing Provider  acetaminophen (TYLENOL) 500 MG tablet Take 1,000 mg by mouth every 6 (six) hours as needed for mild pain.   Yes [provider]  cephALEXin (KEFLEX) 500 MG capsule Take 1 capsule (500 mg total) by mouth 4 (four) times daily. 05/15/17  Yes Lorre Nick, MD  insulin NPH-regular (NOVOLIN 70/30) (70-30) 100 UNIT/ML injection Inject 13 Units into the skin 2 (two) times daily with a meal. Patient taking differently: Inject 10 Units into the skin daily with breakfast.  04/08/13  Yes Dhungel, Nishant, MD  omeprazole (PRILOSEC OTC) 20 MG tablet Take 20 mg by mouth daily.   Yes [provider]  ondansetron (ZOFRAN ODT) 8 MG disintegrating tablet Take 1 tablet (8 mg total) by mouth every 8 (eight) hours as needed for nausea or vomiting. 05/12/17  Yes [place] [employer],  Caryn BeeKevin, MD  furosemide (LASIX) 20 MG tablet Take 1 tablet (20 mg total) by mouth daily. Patient not taking: Reported on 05/15/2017 05/14/13   Doris CheadleAdvani, Deepak, MD  metoCLOPramide (REGLAN) 10 MG tablet Take 1 tablet (10 mg total) by mouth every 6 (six) hours as needed for nausea (nausea/headache). 05/18/17   Dartha LodgeFord, Lashena Signer N, PA-C  oxyCODONE-acetaminophen (PERCOCET/ROXICET) 5-325 MG tablet Take 1-2 tablets by mouth every 4 (four) hours as needed for severe pain. 05/15/17   Lorre NickAllen, Anthony, MD    Family History Family History  Problem Relation Age of Onset  . Cancer Mother     . Diabetes Mother   . Hypertension Mother     Social History Social History   Tobacco Use  . Smoking status: Former Smoker    Packs/day: 0.50    Years: 2.00    Pack years: 1.00    Types: Cigarettes    Last attempt to quit: 07/22/2011    Years since quitting: 5.8  . Smokeless tobacco: Never Used  Substance Use Topics  . Alcohol use: Yes    Alcohol/week: 0.0 oz    Comment: occ  . Drug use: No     Allergies   Vancomycin and Adhesive [tape]   Review of Systems Review of Systems  Constitutional: Negative for chills and fever.  HENT: Negative for congestion, rhinorrhea and sore throat.   Respiratory: Negative for shortness of breath.   Cardiovascular: Negative for chest pain.  Gastrointestinal: Positive for abdominal pain, nausea and vomiting.  Genitourinary: Positive for flank pain. Negative for dysuria and frequency.  Musculoskeletal: Positive for back pain.  Skin: Negative for color change, pallor and rash.  Neurological: Negative for weakness and headaches.     Physical Exam Updated Vital Signs BP 126/73   Pulse 87   Temp (!) 97.3 F (36.3 C) (Oral)   Resp 18   Ht 5\' 1"  (1.549 m)   Wt 127 kg (280 lb)   SpO2 93%   BMI 52.91 kg/m   Physical Exam  Constitutional: She appears well-developed and well-nourished. No distress.  HENT:  Head: Normocephalic and atraumatic.  Mouth/Throat: Oropharynx is clear and moist.  Eyes: Right eye exhibits no discharge. Left eye exhibits no discharge.  Neck: Neck supple.  Cardiovascular: Normal rate, regular rhythm and normal heart sounds.  Pulmonary/Chest: Effort normal and breath sounds normal. No stridor. No respiratory distress. She has no wheezes. She has no rales.  Abdominal: Soft. Bowel sounds are normal. She exhibits no distension and no mass. There is tenderness. There is no guarding.  There is mild epigastric tenderness without guarding, all other quadrants nontender to palpation, there is some mild right flank  tenderness, none on the left  Musculoskeletal: She exhibits no edema or deformity.  Neurological: She is alert. Coordination normal.  Skin: Skin is warm and dry. She is not diaphoretic.  Psychiatric: She has a normal mood and affect. Her behavior is normal.  Nursing note and vitals reviewed.    ED Treatments / Results  Labs (all labs ordered are listed, but only abnormal results are displayed) Labs Reviewed  COMPREHENSIVE METABOLIC PANEL - Abnormal; Notable for the following components:      Result Value   Sodium 134 (*)    Chloride 99 (*)    Glucose, Bld 265 (*)    All other components within normal limits  CBC WITH DIFFERENTIAL/PLATELET - Abnormal; Notable for the following components:   WBC 11.5 (*)    All other components within normal limits  URINALYSIS, ROUTINE W REFLEX MICROSCOPIC - Abnormal; Notable for the following components:   APPearance HAZY (*)    Specific Gravity, Urine 1.039 (*)    Glucose, UA >=500 (*)    Hgb urine dipstick SMALL (*)    Ketones, ur 5 (*)    Leukocytes, UA MODERATE (*)    Squamous Epithelial / LPF 6-30 (*)    All other components within normal limits  URINE CULTURE  PREGNANCY, URINE    EKG  EKG Interpretation None       Radiology No results found.  Procedures Procedures (including critical care time)  Medications Ordered in ED Medications  sodium chloride 0.9 % bolus 1,000 mL (0 mLs Intravenous Stopped 05/18/17 1357)  ondansetron (ZOFRAN) injection 4 mg (4 mg Intravenous Given 05/18/17 1235)  ketorolac (TORADOL) 30 MG/ML injection 30 mg (30 mg Intravenous Given 05/18/17 1235)  cephALEXin (KEFLEX) capsule 500 mg (500 mg Oral Given 05/18/17 1436)  metoCLOPramide (REGLAN) injection 10 mg (10 mg Intravenous Given 05/18/17 1602)     Initial Impression / Assessment and Plan / ED Course  I have reviewed the triage vital signs and the nursing notes.  Pertinent labs & imaging results that were available during my care of the patient were  reviewed by me and considered in my medical decision making (see chart for details).  Patient diagnosed with pyelonephritis 3 days ago and discharged with Keflex, presents to the ED with right flank pain and nausea and vomiting that started today.  No fevers or chills at home.  Urinary symptoms have improved.  Patient able to tolerate the past 2 days of antibiotics, nausea and vomiting today, but patient has not been taking her antiemetics at home.  Patient does have some right-sided CVA tenderness, but abdomen otherwise nontender to palpation.  Will get labs and urinalysis, and start patient on IV fluids and Zofran.  Leukocytosis improved, from 14.5 to 11.5.  Kidney function normal, no electrolyte derangements requiring intervention, glucose is 265, but this appears to be close to patient's baseline during past few visits, no anion gap.  Urine shows improvement in infection with decrease in leukocytes, no bacteria present.  Discussed these results with the patient.  Given that labs are showing such improvement, will treat nausea and vomiting, as well as Toradol for pain and give p.o. challenge and ensure patient can tolerate her antibiotics here in the ED.  If this is a case patient will be stable for discharge home.  Patient tolerating ginger ale, able to take her antibiotics here in the ED.  Patient did endorse some nausea afterwards, provided sandwich, reporting continued nausea, will give dose of Reglan and reevaluate.  Reports improvement after Reglan, and has had no episodes of emesis in the ED.  Patient stable for discharge home, given that Reglan seemed to work better, will discharge patient with prescription for a few Reglan, instructed patient not to take these in addition to Zofran.  Instructed patient to use ibuprofen or Tylenol for pain and to continue taking her Keflex.  Patient to follow-up with her PCP on Monday.  Strict return precautions discussed.  Patient expressed understanding and is  in agreement with plan.  Final Clinical Impressions(s) / ED Diagnoses   Final diagnoses:  Pyelonephritis  Non-intractable vomiting with nausea, unspecified vomiting type    ED Discharge Orders        Ordered    metoCLOPramide (REGLAN) 10 MG tablet  Every 6 hours PRN     05/18/17 1618  Dartha Lodge, PA-C 05/18/17 1623    Gerhard Munch, MD 05/19/17 1330

## 2017-05-18 NOTE — ED Notes (Signed)
Patient c/o nausea. Provided with emesis bag. PA made aware.

## 2017-05-18 NOTE — ED Triage Notes (Signed)
Patient reports that she was seen in the ED 3 days ago and was told she had an UTI. Patient states she was prescribed antibiotics?Keflex, but not getting any better. Patient continues to c/o right lower back pain and vomiting.

## 2017-05-18 NOTE — ED Notes (Signed)
Patient given water. Tolerating PO fluids at this time.

## 2017-05-18 NOTE — Discharge Instructions (Signed)
Your evaluation today is very reassuring, labs are showing vast improvement since being started on antibiotics.  Please continue taking your Keflex 4 times a day.  Nausea and vomiting you may use Reglan, please do not take this and Zofran together.  Nausea should improve as her infection improves also try to keep something on your stomach and drink plenty of fluids.  Ibuprofen or Tylenol for pain.  Please call to schedule follow-up appointment with your primary care doctor for Monday.  If you have worsening nausea vomiting and or unable to keep down your antibiotics, worsening pain, fevers or chills or other new or concerning symptoms please return to the ED for reevaluation.

## 2017-05-19 LAB — URINE CULTURE: Culture: NO GROWTH

## 2017-05-22 ENCOUNTER — Telehealth: Payer: Self-pay | Admitting: Emergency Medicine

## 2017-05-25 ENCOUNTER — Other Ambulatory Visit: Payer: Self-pay

## 2017-05-25 ENCOUNTER — Encounter (HOSPITAL_COMMUNITY): Payer: Self-pay | Admitting: Emergency Medicine

## 2017-05-25 DIAGNOSIS — E119 Type 2 diabetes mellitus without complications: Secondary | ICD-10-CM | POA: Insufficient documentation

## 2017-05-25 DIAGNOSIS — Z87891 Personal history of nicotine dependence: Secondary | ICD-10-CM | POA: Insufficient documentation

## 2017-05-25 DIAGNOSIS — Z794 Long term (current) use of insulin: Secondary | ICD-10-CM | POA: Insufficient documentation

## 2017-05-25 DIAGNOSIS — R1031 Right lower quadrant pain: Secondary | ICD-10-CM | POA: Insufficient documentation

## 2017-05-25 DIAGNOSIS — R112 Nausea with vomiting, unspecified: Secondary | ICD-10-CM | POA: Diagnosis not present

## 2017-05-25 LAB — COMPREHENSIVE METABOLIC PANEL
ALT: 20 U/L (ref 14–54)
AST: 19 U/L (ref 15–41)
Albumin: 3 g/dL — ABNORMAL LOW (ref 3.5–5.0)
Alkaline Phosphatase: 83 U/L (ref 38–126)
Anion gap: 11 (ref 5–15)
BUN: 5 mg/dL — ABNORMAL LOW (ref 6–20)
CO2: 24 mmol/L (ref 22–32)
Calcium: 8.8 mg/dL — ABNORMAL LOW (ref 8.9–10.3)
Chloride: 104 mmol/L (ref 101–111)
Creatinine, Ser: 0.62 mg/dL (ref 0.44–1.00)
GFR calc Af Amer: >60 mL/min (ref >60)
GFR calc non Af Amer: >60 mL/min (ref >60)
Glucose, Bld: 222 mg/dL — ABNORMAL HIGH (ref 65–99)
Potassium: 3.9 mmol/L (ref 3.5–5.1)
Sodium: 139 mmol/L (ref 135–145)
Total Bilirubin: 0.5 mg/dL (ref 0.3–1.2)
Total Protein: 6.5 g/dL (ref 6.5–8.1)

## 2017-05-25 LAB — CBC
HCT: 39.9 % (ref 36.0–46.0)
Hemoglobin: 13 g/dL (ref 12.0–15.0)
MCH: 28.3 pg (ref 26.0–34.0)
MCHC: 32.6 g/dL (ref 30.0–36.0)
MCV: 86.7 fL (ref 78.0–100.0)
Platelets: 348 10*3/uL (ref 150–400)
RBC: 4.6 MIL/uL (ref 3.87–5.11)
RDW: 14.3 % (ref 11.5–15.5)
WBC: 10.6 10*3/uL — ABNORMAL HIGH (ref 4.0–10.5)

## 2017-05-25 LAB — URINALYSIS, ROUTINE W REFLEX MICROSCOPIC
Bilirubin Urine: NEGATIVE
Glucose, UA: >=500 mg/dL — AB
Hgb urine dipstick: NEGATIVE
Ketones, ur: NEGATIVE mg/dL
Nitrite: NEGATIVE
Protein, ur: NEGATIVE mg/dL
Specific Gravity, Urine: 1.022 (ref 1.005–1.030)
pH: 6 (ref 5.0–8.0)

## 2017-05-25 LAB — LIPASE, BLOOD: Lipase: 40 U/L (ref 11–51)

## 2017-05-25 LAB — I-STAT BETA HCG BLOOD, ED (MC, WL, AP ONLY): I-stat hCG, quantitative: <5 m[IU]/mL (ref <5)

## 2017-05-25 NOTE — ED Triage Notes (Signed)
Pt c/o nausea/vomiting/abdominal pain, hx UTI. [facility], and states she has not improved. NAD noted at this time.

## 2017-05-26 ENCOUNTER — Emergency Department (HOSPITAL_COMMUNITY)
Admission: EM | Admit: 2017-05-26 | Discharge: 2017-05-26 | Disposition: A | Discharge status: Home / Self Care | Payer: BLUE CROSS/BLUE SHIELD | Attending: Emergency Medicine | Admitting: Emergency Medicine

## 2017-05-26 ENCOUNTER — Emergency Department (HOSPITAL_COMMUNITY): Payer: BLUE CROSS/BLUE SHIELD

## 2017-05-26 DIAGNOSIS — R1031 Right lower quadrant pain: Secondary | ICD-10-CM

## 2017-05-26 DIAGNOSIS — R112 Nausea with vomiting, unspecified: Secondary | ICD-10-CM

## 2017-05-26 MED ORDER — SODIUM CHLORIDE 0.9 % IV BOLUS (SEPSIS)
1000.0000 mL | Freq: Once | INTRAVENOUS | Status: AC
  Administered 2017-05-26: 1000 mL via INTRAVENOUS

## 2017-05-26 MED ORDER — METOCLOPRAMIDE HCL 5 MG/ML IJ SOLN
10.0000 mg | Freq: Once | INTRAMUSCULAR | Status: AC
  Administered 2017-05-26: 10 mg via INTRAVENOUS
  Filled 2017-05-26: qty 2

## 2017-05-26 MED ORDER — PROMETHAZINE HCL 25 MG PO TABS: 25.0000 mg | ORAL_TABLET | Freq: Four times a day (QID) | ORAL | 0 refills | Status: DC | PRN

## 2017-05-26 MED ORDER — ONDANSETRON HCL 4 MG/2ML IJ SOLN
4.0000 mg | Freq: Once | INTRAMUSCULAR | Status: AC
  Administered 2017-05-26: 4 mg via INTRAVENOUS
  Filled 2017-05-26: qty 2

## 2017-05-26 MED ORDER — PANTOPRAZOLE SODIUM 20 MG PO TBEC: 20.0000 mg | DELAYED_RELEASE_TABLET | Freq: Every day | ORAL | 0 refills | Status: DC

## 2017-05-26 MED ORDER — TRAMADOL HCL 50 MG PO TABS: 50.0000 mg | ORAL_TABLET | Freq: Four times a day (QID) | ORAL | 0 refills | Status: DC | PRN

## 2017-05-26 NOTE — ED Notes (Signed)
Called pt to be reassessed 2X. Pt is no where in waiting room at this time.

## 2017-05-26 NOTE — ED Provider Notes (Signed)
[facility] EMERGENCY [facility]PARTMENT Provider Note   CSN: [id number] Arrival date & time: 05/25/17  1731     History   Chief Complaint Chief Complaint  Patient presents with  . Abdominal Pain    HPI [name] is a 47 y.o. female.  Patient is a 47 year old female with a history of diabetes and gastroesophageal reflux disease who presents with right side abdominal pain and vomiting.  She states is been going on for about 2 weeks.  She is been seen twice for similar symptoms.  She was seen on January 28 and January 31.  She states her pain is getting worse.  She describes an achy pain in her right lower abdomen.  It radiates a little bit to her back.  She denies any urinary symptoms.  She is having normal bowel movements.  She does report fevers up to 101 yesterday.  She states she cannot even take sips of Sprite without vomiting.  On her prior visits she had a urinalysis which showed signs of infection and she was given Rocephin and started on Keflex which she currently is taking but she does not feel like it is helping.  I reviewed her records and her urine culture did not grow out any bacteria.  She has occasional loose stools but no ongoing diarrhea.      Past Medical History:  Diagnosis Date  . Cellulitis 07/2011  . Diabetes mellitus    dx 2016  . GERD (gastroesophageal reflux disease)     Patient Active Problem List   Diagnosis Date Noted  . Bilateral carpal tunnel syndrome 07/06/2016  . Left elbow pain 02/17/2016  . Tobacco abuse 10/29/2015  . Injury of right hand 05/19/2015  . Cervical disc disorder with radiculopathy of cervical region 10/10/2013  . Type II or unspecified type diabetes mellitus without mention of complication, uncontrolled 04/07/2013  . Pyelonephritis 04/06/2013  . RLQ abdominal pain 04/06/2013  . Fever 09/07/2012  . Headache(784.0) 09/07/2012  . Cellulitis 08/05/2011  . Peripheral edema 08/05/2011  . GERD (gastroesophageal reflux  disease) 08/05/2011  . Obesity 08/05/2011    Past Surgical History:  Procedure Laterality Date  . ABLATION    . CARPAL TUNNEL RELEASE Left 09/13/2016   Procedure: LEFT CARPAL TUNNEL RELEASE;  Surgeon: Cindee SaltKuzma, Gary, MD;  Location: [facility] OR;  Service: Orthopedics;  Laterality: Left;  . CHOLECYSTECTOMY    . KNEE ARTHROSCOPY    . LAPAROSCOPIC TUBAL LIGATION  04/29/2011   Procedure: LAPAROSCOPIC TUBAL LIGATION;  Surgeon: Turner Danielsavid C Lowe, MD;  Location: [facility] OR[facility];  Service: Gynecology;  Laterality: Bilateral;  with Filshie Clips  . ROTATOR CUFF REPAIR    . TUBAL LIGATION      OB History    No data available       Home Medications    Prior to Admission medications   Medication Sig Start Date End Date Taking? Authorizing Provider  acetaminophen (TYLENOL) 500 MG tablet Take 1,000 mg by mouth every 6 (six) hours as needed for mild pain.   Yes [provider]  cephALEXin (KEFLEX) 500 MG capsule Take 1 capsule (500 mg total) by mouth 4 (four) times daily. 05/15/17  Yes Lorre NickAllen, Anthony, MD  insulin NPH-regular (NOVOLIN 70/30) (70-30) 100 UNIT/ML injection Inject 13 Units into the skin 2 (two) times daily with a meal. Patient taking differently: Inject 10 Units into the skin daily with supper.  04/08/13  Yes Dhungel, Nishant, MD  ondansetron (ZOFRAN ODT) 8 MG disintegrating tablet Take 1 tablet (  8 mg total) by mouth every 8 (eight) hours as needed for nausea or vomiting. 05/12/17  Yes Azalia Bilis, MD  furosemide (LASIX) 20 MG tablet Take 1 tablet (20 mg total) by mouth daily. Patient not taking: Reported on 05/15/2017 05/14/13   Doris Cheadle, MD  metoCLOPramide (REGLAN) 10 MG tablet Take 1 tablet (10 mg total) by mouth every 6 (six) hours as needed for nausea (nausea/headache). Patient not taking: Reported on 05/26/2017 05/18/17   Dartha Lodge, PA-C  oxyCODONE-acetaminophen (PERCOCET/ROXICET) 5-325 MG tablet Take 1-2 tablets by mouth every 4 (four) hours as needed for severe pain. 05/15/17   Lorre Nick, MD  pantoprazole (PROTONIX) 20 MG tablet Take 1 tablet (20 mg total) by mouth daily. 05/26/17   Rolan Bucco, MD  promethazine (PHENERGAN) 25 MG tablet Take 1 tablet (25 mg total) by mouth every 6 (six) hours as needed for nausea or vomiting. 05/26/17   Rolan Bucco, MD  traMADol (ULTRAM) 50 MG tablet Take 1 tablet (50 mg total) by mouth every 6 (six) hours as needed. 05/26/17   Rolan Bucco, MD    Family History Family History  Problem Relation Age of Onset  . Cancer Mother   . Diabetes Mother   . Hypertension Mother     Social History Social History   Tobacco Use  . Smoking status: Former Smoker    Packs/day: 0.50    Years: 2.00    Pack years: 1.00    Types: Cigarettes    Last attempt to quit: 07/22/2011    Years since quitting: 5.8  . Smokeless tobacco: Never Used  Substance Use Topics  . Alcohol use: Yes    Alcohol/week: 0.0 oz    Comment: occ  . Drug use: No     Allergies   Vancomycin and Adhesive [tape]   Review of Systems Review of Systems  Constitutional: Negative for chills, diaphoresis, fatigue and fever.  HENT: Negative for congestion, rhinorrhea and sneezing.   Eyes: Negative.   Respiratory: Negative for cough, chest tightness and shortness of breath.   Cardiovascular: Negative for chest pain and leg swelling.  Gastrointestinal: Positive for abdominal pain, nausea and vomiting. Negative for blood in stool and diarrhea.  Genitourinary: Negative for difficulty urinating, flank pain, frequency and hematuria.  Musculoskeletal: Negative for arthralgias and back pain.  Skin: Negative for rash.  Neurological: Negative for dizziness, speech difficulty, weakness, numbness and headaches.     Physical Exam Updated Vital Signs BP 111/66 (BP Location: Left Arm)   Pulse 81   Temp 98.3 F (36.8 C) (Oral)   Resp 18   Ht 5\' 1"  (1.549 m)   Wt 127 kg (280 lb)   SpO2 97%   BMI 52.91 kg/m   Physical Exam  Constitutional: She is oriented to person,  place, and time. She appears well-developed and well-nourished.  HENT:  Head: Normocephalic and atraumatic.  Eyes: Pupils are equal, round, and reactive to light.  Neck: Normal range of motion. Neck supple.  Cardiovascular: Normal rate, regular rhythm and normal heart sounds.  Pulmonary/Chest: Effort normal and breath sounds normal. No respiratory distress. She has no wheezes. She has no rales. She exhibits no tenderness.  Abdominal: Soft. Bowel sounds are normal. There is tenderness in the right lower quadrant. There is no rebound and no guarding.  Musculoskeletal: Normal range of motion. She exhibits no edema.  Lymphadenopathy:    She has no cervical adenopathy.  Neurological: She is alert and oriented to person, place, and time.  Skin: Skin is warm and dry. No rash noted.  Psychiatric: She has a normal mood and affect.     ED Treatments / Results  Labs (all labs ordered are listed, but only abnormal results are displayed) Labs Reviewed  COMPREHENSIVE METABOLIC PANEL - Abnormal; Notable for the following components:      Result Value   Glucose, Bld 222 (*)    BUN 5 (*)    Calcium 8.8 (*)    Albumin 3.0 (*)    All other components within normal limits  CBC - Abnormal; Notable for the following components:   WBC 10.6 (*)    All other components within normal limits  URINALYSIS, ROUTINE W REFLEX MICROSCOPIC - Abnormal; Notable for the following components:   APPearance HAZY (*)    Glucose, UA >=500 (*)    Leukocytes, UA LARGE (*)    Bacteria, UA RARE (*)    Squamous Epithelial / LPF 6-30 (*)    All other components within normal limits  URINE CULTURE  LIPASE, BLOOD  I-STAT BETA HCG BLOOD, ED ([facility], [facility], [facility] ONLY)    EKG  EKG Interpretation None       Radiology Ct Renal Stone Study  Result Date: 05/26/2017 CLINICAL DATA:  Right-sided flank pain for several weeks EXAM: CT ABDOMEN AND PELVIS WITHOUT CONTRAST TECHNIQUE: Multidetector CT imaging of the abdomen and pelvis was  performed following the standard protocol without IV contrast. COMPARISON:  05/15/2017 FINDINGS: Lower chest: No acute abnormality. Hepatobiliary: No focal liver abnormality is seen. Status post cholecystectomy. No biliary dilatation. Pancreas: Unremarkable. No pancreatic ductal dilatation or surrounding inflammatory changes. Spleen: Normal in size without focal abnormality. Adrenals/Urinary Tract: The adrenal glands are within normal limits. Kidneys are well visualized bilaterally without renal calculi or urinary tract obstructive changes. Bladder is decompressed. Stomach/Bowel: Stomach is within normal limits. Appendix appears normal. No evidence of bowel wall thickening, distention, or inflammatory changes. Vascular/Lymphatic: No significant vascular findings are present. No enlarged abdominal or pelvic lymph nodes. Reproductive: Uterus and bilateral adnexa are unremarkable. Bilateral tubal ligation is noted. Other: Tiny fat containing umbilical hernia is again seen. No abdominopelvic ascites. Musculoskeletal: No acute or significant osseous findings. IMPRESSION: No acute abnormality noted. No significant change from the prior exam is seen. Electronically Signed   By: Alcide Clever M.D.   On: 05/26/2017 08:56    Procedures Procedures (including critical care time)  Medications Ordered in ED Medications  sodium chloride 0.9 % bolus 1,000 mL (0 mLs Intravenous Stopped 05/26/17 1040)  ondansetron (ZOFRAN) injection 4 mg (4 mg Intravenous Given 05/26/17 0837)  metoCLOPramide (REGLAN) injection 10 mg (10 mg Intravenous Given 05/26/17 1141)     Initial Impression / Assessment and Plan / ED Course  I have reviewed the triage vital signs and the nursing notes.  Pertinent labs & imaging results that were available during my care of the patient were reviewed by me and considered in my medical decision making (see chart for details).     Patient is a 47 year old female who presents with a 2-week history of  persistent right lower quadrant abdominal pain associate with some nausea vomiting this is a third visit to the emergency department.  She has no pain specifically around her gallbladder.  She did have pain in the right lower quadrant.  She had a recent CT scan but given her worsening symptoms, I repeated the CT scan which showed no evidence of appendicitis or other acute abnormality.  Her white count has been steadily  improving.  Her labs have improved.  Her glucose is elevated which is not uncommon for her.  She continued to have some nausea and she reported vomiting in the ED as well.  She was given IV fluids as well as Zofran.  She also was given Reglan and she felt better after the Reglan.  Given her improvement of labs with a normal CT scan and improving symptoms, I feel that she can be discharged home.  She was given a referral to follow-up with gastroenterology but she was encouraged to follow-up with her PCP as well.  She was given prescriptions for Phenergan, tramadol and Protonix.  Return precautions were given.  She is currently still on Keflex for a UTI although her urine culture did not grow out any bacteria.  She does have leukocytes on her urinalysis today but also a lot of squamous cells.  I have sent for culture but at this point we will not change her antibiotics.  Final Clinical Impressions(s) / ED Diagnoses   Final diagnoses:  Right lower quadrant abdominal pain  Non-intractable vomiting with nausea, unspecified vomiting type    ED Discharge Orders        Ordered    promethazine (PHENERGAN) 25 MG tablet  Every 6 hours PRN     05/26/17 1316    traMADol (ULTRAM) 50 MG tablet  Every 6 hours PRN     05/26/17 1316    pantoprazole (PROTONIX) 20 MG tablet  Daily     05/26/17 1316       Rolan Bucco, MD 05/26/17 1322

## 2017-05-26 NOTE — ED Notes (Signed)
Pt brought back to hall bed. MD aware and is at bedside.

## 2017-05-26 NOTE — ED Notes (Signed)
Pt stating she is unable to keep down anything PO. Reglan given. Pt states she just used restroom; unable to obtain urine culture at this time.

## 2017-05-27 ENCOUNTER — Other Ambulatory Visit (HOSPITAL_BASED_OUTPATIENT_CLINIC_OR_DEPARTMENT_OTHER): Payer: Self-pay | Admitting: Family

## 2017-05-27 DIAGNOSIS — Z1239 Encounter for other screening for malignant neoplasm of breast: Secondary | ICD-10-CM

## 2017-05-27 LAB — URINE CULTURE

## 2017-06-03 ENCOUNTER — Ambulatory Visit (HOSPITAL_BASED_OUTPATIENT_CLINIC_OR_DEPARTMENT_OTHER)
Admission: RE | Admit: 2017-06-03 | Discharge: 2017-06-03 | Disposition: A | Discharge status: Home / Self Care | Payer: BLUE CROSS/BLUE SHIELD | Source: Ambulatory Visit | Attending: Family | Admitting: Family

## 2017-06-03 ENCOUNTER — Other Ambulatory Visit (HOSPITAL_BASED_OUTPATIENT_CLINIC_OR_DEPARTMENT_OTHER): Payer: Self-pay | Admitting: Family Medicine

## 2017-06-03 DIAGNOSIS — Z1239 Encounter for other screening for malignant neoplasm of breast: Secondary | ICD-10-CM

## 2017-06-03 DIAGNOSIS — Z1231 Encounter for screening mammogram for malignant neoplasm of breast: Secondary | ICD-10-CM | POA: Diagnosis not present

## 2017-11-30 ENCOUNTER — Other Ambulatory Visit: Payer: Self-pay

## 2017-11-30 ENCOUNTER — Emergency Department (HOSPITAL_BASED_OUTPATIENT_CLINIC_OR_DEPARTMENT_OTHER): Payer: BLUE CROSS/BLUE SHIELD

## 2017-11-30 ENCOUNTER — Emergency Department (HOSPITAL_BASED_OUTPATIENT_CLINIC_OR_DEPARTMENT_OTHER)
Admission: EM | Admit: 2017-11-30 | Discharge: 2017-11-30 | Disposition: A | Discharge status: Home / Self Care | Payer: BLUE CROSS/BLUE SHIELD | Attending: Emergency Medicine | Admitting: Emergency Medicine

## 2017-11-30 ENCOUNTER — Encounter (HOSPITAL_BASED_OUTPATIENT_CLINIC_OR_DEPARTMENT_OTHER): Payer: Self-pay

## 2017-11-30 DIAGNOSIS — Z87891 Personal history of nicotine dependence: Secondary | ICD-10-CM | POA: Insufficient documentation

## 2017-11-30 DIAGNOSIS — Z79899 Other long term (current) drug therapy: Secondary | ICD-10-CM | POA: Insufficient documentation

## 2017-11-30 DIAGNOSIS — E119 Type 2 diabetes mellitus without complications: Secondary | ICD-10-CM | POA: Diagnosis not present

## 2017-11-30 DIAGNOSIS — M5412 Radiculopathy, cervical region: Secondary | ICD-10-CM

## 2017-11-30 DIAGNOSIS — M542 Cervicalgia: Secondary | ICD-10-CM

## 2017-11-30 DIAGNOSIS — Z794 Long term (current) use of insulin: Secondary | ICD-10-CM | POA: Diagnosis not present

## 2017-11-30 MED ORDER — ORPHENADRINE CITRATE ER 100 MG PO TB12: 100.0000 mg | ORAL_TABLET | Freq: Two times a day (BID) | ORAL | 0 refills | Status: AC

## 2017-11-30 NOTE — Discharge Instructions (Addendum)
Take Norflex as prescribed as needed for pain. Also Motrin and Tylenol.  Warm compresses to neck for 20 minutes at a time. Recheck with your doctor.  Limit lifting to 10lbs.

## 2017-11-30 NOTE — ED Triage Notes (Signed)
Pt states she felt a pop to posterior neck when she coughed approx 1-1.5 hours PTA-NAD-steady gait

## 2017-11-30 NOTE — ED Provider Notes (Signed)
[facility] EMERGENCY DEPARTMENT Provider Note   CSN: [id number] Arrival date & time: 11/30/17  1926     History   Chief Complaint Chief Complaint  Patient presents with  . Neck Pain    HPI [name] is a 47 y.o. female.  47 year old female presents with complaint of neck pain.  Patient states that earlier today she coughed and developed pain in her neck which radiates down both arms.  Patient states she has similar episode in the past however cannot recall what happened at that time.  Otherwise denies neck or back problems.  Patient denies arm weakness or numbness.  Denies recent falls or injuries.  No other complaints or concerns.     Past Medical History:  Diagnosis Date  . Cellulitis 07/2011  . Diabetes mellitus    dx 2016  . GERD (gastroesophageal reflux disease)     Patient Active Problem List   Diagnosis Date Noted  . Bilateral carpal tunnel syndrome 07/06/2016  . Left elbow pain 02/17/2016  . Tobacco abuse 10/29/2015  . Injury of right hand 05/19/2015  . Cervical disc disorder with radiculopathy of cervical region 10/10/2013  . Type II or unspecified type diabetes mellitus without mention of complication, uncontrolled 04/07/2013  . Pyelonephritis 04/06/2013  . RLQ abdominal pain 04/06/2013  . Fever 09/07/2012  . Headache(784.0) 09/07/2012  . Cellulitis 08/05/2011  . Peripheral edema 08/05/2011  . GERD (gastroesophageal reflux disease) 08/05/2011  . Obesity 08/05/2011    Past Surgical History:  Procedure Laterality Date  . ABLATION    . CARPAL TUNNEL RELEASE Left 09/13/2016   Procedure: LEFT CARPAL TUNNEL RELEASE;  Surgeon: Cindee SaltKuzma, Gary, MD;  Location: [facility] OR;  Service: Orthopedics;  Laterality: Left;  . CHOLECYSTECTOMY    . KNEE ARTHROSCOPY    . LAPAROSCOPIC TUBAL LIGATION  04/29/2011   Procedure: LAPAROSCOPIC TUBAL LIGATION;  Surgeon: Turner Danielsavid C Lowe, MD;  Location: [facility]S;  Service: Gynecology;  Laterality: Bilateral;  with Filshie Clips  .  ROTATOR CUFF REPAIR    . TUBAL LIGATION       OB History   None      Home Medications    Prior to Admission medications   Medication Sig Start Date End Date Taking? Authorizing Provider  acetaminophen (TYLENOL) 500 MG tablet Take 1,000 mg by mouth every 6 (six) hours as needed for mild pain.    [provider]  cephALEXin (KEFLEX) 500 MG capsule Take 1 capsule (500 mg total) by mouth 4 (four) times daily. 05/15/17   Lorre NickAllen, Anthony, MD  insulin NPH-regular (NOVOLIN 70/30) (70-30) 100 UNIT/ML injection Inject 13 Units into the skin 2 (two) times daily with a meal. Patient taking differently: Inject 10 Units into the skin daily with supper.  04/08/13   Dhungel, Theda BelfastNishant, MD  ondansetron (ZOFRAN ODT) 8 MG disintegrating tablet Take 1 tablet (8 mg total) by mouth every 8 (eight) hours as needed for nausea or vomiting. 05/12/17   Azalia Bilisampos, Kevin, MD  orphenadrine (NORFLEX) 100 MG tablet Take 1 tablet (100 mg total) by mouth 2 (two) times daily for 10 days. 11/30/17 12/10/17  Jeannie FendMurphy, Yuriel Lopezmartinez A, PA-C  oxyCODONE-acetaminophen (PERCOCET/ROXICET) 5-325 MG tablet Take 1-2 tablets by mouth every 4 (four) hours as needed for severe pain. 05/15/17   Lorre NickAllen, Anthony, MD  pantoprazole (PROTONIX) 20 MG tablet Take 1 tablet (20 mg total) by mouth daily. 05/26/17   Rolan BuccoBelfi, Melanie, MD  promethazine (PHENERGAN) 25 MG tablet Take 1 tablet (25 mg total) by mouth every  6 (six) hours as needed for nausea or vomiting. 05/26/17   Rolan BuccoBelfi, Melanie, MD    Family History Family History  Problem Relation Age of Onset  . Cancer Mother   . Diabetes Mother   . Hypertension Mother     Social History Social History   Tobacco Use  . Smoking status: Former Smoker    Packs/day: 0.50    Years: 2.00    Pack years: 1.00    Types: Cigarettes    Last attempt to quit: 07/22/2011    Years since quitting: 6.3  . Smokeless tobacco: Never Used  Substance Use Topics  . Alcohol use: Yes    Alcohol/week: 0.0 standard drinks     Comment: occ  . Drug use: No     Allergies   Vancomycin and Adhesive [tape]   Review of Systems Review of Systems  Constitutional: Negative for chills and fever.  Gastrointestinal: Negative for abdominal pain.  Genitourinary: Negative for difficulty urinating.  Musculoskeletal: Positive for myalgias and neck pain. Negative for arthralgias and back pain.  Skin: Negative for rash and wound.  Allergic/Immunologic: Positive for immunocompromised state.  Neurological: Negative for weakness and numbness.  Hematological: Does not bruise/bleed easily.  Psychiatric/Behavioral: Negative for confusion.  All other systems reviewed and are negative.    Physical Exam Updated Vital Signs BP (!) 132/96 (BP Location: Left Arm)   Pulse 96   Temp 97.8 F (36.6 C) (Oral)   Resp 18   Ht 5\' 1"  (1.549 m)   Wt (!) 143.4 kg   SpO2 96%   BMI 59.73 kg/m   Physical Exam  Constitutional: She is oriented to person, place, and time. She appears well-developed and well-nourished. No distress.  HENT:  Head: Normocephalic and atraumatic.  Cardiovascular: Normal rate, regular rhythm, normal heart sounds and intact distal pulses.  Pulmonary/Chest: Effort normal and breath sounds normal. No respiratory distress.  Musculoskeletal: She exhibits tenderness. She exhibits no deformity.       Cervical back: She exhibits decreased range of motion and tenderness. She exhibits no bony tenderness.       Back:  Tender to palpation left and right trapezius  Lymphadenopathy:    She has no cervical adenopathy.  Neurological: She is alert and oriented to person, place, and time. No sensory deficit. She exhibits normal muscle tone. GCS eye subscore is 4. GCS verbal subscore is 5. GCS motor subscore is 6.  Grip strength equal  Skin: Skin is warm and dry. No rash noted. She is not diaphoretic.  Psychiatric: She has a normal mood and affect. Her behavior is normal.  Nursing note and vitals reviewed.    ED  Treatments / Results  Labs (all labs ordered are listed, but only abnormal results are displayed) Labs Reviewed - No data to display  EKG None  Radiology Dg Cervical Spine Complete  Result Date: 11/30/2017 CLINICAL DATA:  Felt pop in posterior neck when coughing.  Pain. EXAM: CERVICAL SPINE - COMPLETE 4+ VIEW COMPARISON:  None. FINDINGS: There is no evidence of cervical spine fracture or prevertebral soft tissue swelling. Alignment is normal. No other significant bone abnormalities are identified. IMPRESSION: Negative cervical spine radiographs. Electronically Signed   By: Charlett NoseKevin  Dover M.D.   On: 11/30/2017 20:28    Procedures Procedures (including critical care time)  Medications Ordered in ED Medications - No data to display   Initial Impression / Assessment and Plan / ED Course  I have reviewed the triage vital signs and the nursing  notes.  Pertinent labs & imaging results that were available during my care of the patient were reviewed by me and considered in my medical decision making (see chart for details).  Clinical Course as of Dec 01 2035  Thu Nov 30, 2017  6053 47 year old female with neck pain after coughing today, radiates down both arms.  On exam has tenderness to her bilateral trapezius areas, no midline or bony tenderness but normal grip strength, normal arm sensation, normal pulses.  No injury or trauma otherwise.  X-ray C-spine unremarkable, recommend Norflex, Motrin, Tylenol for recheck with PCP.   [LM]    Clinical Course User Index [LM] Jeannie Fend, PA-C    Final Clinical Impressions(s) / ED Diagnoses   Final diagnoses:  Neck pain  Cervical radiculopathy    ED Discharge Orders         Ordered    orphenadrine (NORFLEX) 100 MG tablet  2 times daily     11/30/17 2034           Jeannie Fend, PA-C 11/30/17 2037    Tegeler, Canary Brim, MD 12/01/17 [phone]

## 2017-12-25 ENCOUNTER — Emergency Department (HOSPITAL_BASED_OUTPATIENT_CLINIC_OR_DEPARTMENT_OTHER)
Admission: EM | Admit: 2017-12-25 | Discharge: 2017-12-26 | Disposition: A | Discharge status: Home / Self Care | Payer: BLUE CROSS/BLUE SHIELD | Attending: Emergency Medicine | Admitting: Emergency Medicine

## 2017-12-25 ENCOUNTER — Other Ambulatory Visit: Payer: Self-pay

## 2017-12-25 ENCOUNTER — Encounter (HOSPITAL_BASED_OUTPATIENT_CLINIC_OR_DEPARTMENT_OTHER): Payer: Self-pay

## 2017-12-25 ENCOUNTER — Emergency Department (HOSPITAL_BASED_OUTPATIENT_CLINIC_OR_DEPARTMENT_OTHER): Payer: BLUE CROSS/BLUE SHIELD

## 2017-12-25 DIAGNOSIS — Z87891 Personal history of nicotine dependence: Secondary | ICD-10-CM | POA: Insufficient documentation

## 2017-12-25 DIAGNOSIS — Z794 Long term (current) use of insulin: Secondary | ICD-10-CM | POA: Insufficient documentation

## 2017-12-25 DIAGNOSIS — Z79899 Other long term (current) drug therapy: Secondary | ICD-10-CM | POA: Diagnosis not present

## 2017-12-25 DIAGNOSIS — L03115 Cellulitis of right lower limb: Secondary | ICD-10-CM | POA: Diagnosis not present

## 2017-12-25 DIAGNOSIS — E119 Type 2 diabetes mellitus without complications: Secondary | ICD-10-CM | POA: Diagnosis not present

## 2017-12-25 DIAGNOSIS — R202 Paresthesia of skin: Secondary | ICD-10-CM | POA: Insufficient documentation

## 2017-12-25 DIAGNOSIS — M79661 Pain in right lower leg: Secondary | ICD-10-CM | POA: Diagnosis not present

## 2017-12-25 MED ORDER — HYDROCODONE-ACETAMINOPHEN 5-325 MG PO TABS
1.0000 | ORAL_TABLET | Freq: Once | ORAL | Status: AC
  Administered 2017-12-25: 1 via ORAL
  Filled 2017-12-25: qty 1

## 2017-12-25 MED ORDER — DOXYCYCLINE HYCLATE 100 MG PO CAPS: 100.0000 mg | ORAL_CAPSULE | Freq: Two times a day (BID) | ORAL | 0 refills | Status: DC

## 2017-12-25 MED ORDER — DOXYCYCLINE HYCLATE 100 MG PO TABS
100.0000 mg | ORAL_TABLET | Freq: Once | ORAL | Status: AC
  Administered 2017-12-25: 100 mg via ORAL
  Filled 2017-12-25: qty 1

## 2017-12-25 NOTE — ED Provider Notes (Signed)
[facility] EMERGENCY DEPARTMENT Provider Note   CSN: [id number] Arrival date & time: 12/25/17  2038     History   Chief Complaint Chief Complaint  Patient presents with  . Leg Pain    HPI [name] is a 47 y.o. female.  HPI  This is a 47 year old female with a history of diabetes and cellulitis who presents with right calf pain.  Onset of right calf pain yesterday.  Denies any trauma.  No fevers or systemic symptoms.  She rates her pain at 8 out of 10.  It is worse with ambulation.  She states she has some tingling in the foot which happens when "I had my cellulitis before."  Denies any fevers.  Denies chest pain, shortness of breath, abdominal pain.  Past Medical History:  Diagnosis Date  . Cellulitis 07/2011  . Diabetes mellitus    dx 2016  . GERD (gastroesophageal reflux disease)     Patient Active Problem List   Diagnosis Date Noted  . Bilateral carpal tunnel syndrome 07/06/2016  . Left elbow pain 02/17/2016  . Tobacco abuse 10/29/2015  . Injury of right hand 05/19/2015  . Cervical disc disorder with radiculopathy of cervical region 10/10/2013  . Type II or unspecified type diabetes mellitus without mention of complication, uncontrolled 04/07/2013  . Pyelonephritis 04/06/2013  . RLQ abdominal pain 04/06/2013  . Fever 09/07/2012  . Headache(784.0) 09/07/2012  . Cellulitis 08/05/2011  . Peripheral edema 08/05/2011  . GERD (gastroesophageal reflux disease) 08/05/2011  . Obesity 08/05/2011    Past Surgical History:  Procedure Laterality Date  . ABLATION    . CARPAL TUNNEL RELEASE Left 09/13/2016   Procedure: LEFT CARPAL TUNNEL RELEASE;  Surgeon: Cindee Salt, MD;  Location: [facility] OR;  Service: Orthopedics;  Laterality: Left;  . CHOLECYSTECTOMY    . KNEE ARTHROSCOPY    . LAPAROSCOPIC TUBAL LIGATION  04/29/2011   Procedure: LAPAROSCOPIC TUBAL LIGATION;  Surgeon: Turner Daniels, MD;  Location: [facility] ORS;  Service: Gynecology;  Laterality: Bilateral;  with  Filshie Clips  . ROTATOR CUFF REPAIR    . TUBAL LIGATION       OB History   None      Home Medications    Prior to Admission medications   Medication Sig Start Date End Date Taking? Authorizing Provider  acetaminophen (TYLENOL) 500 MG tablet Take 1,000 mg by mouth every 6 (six) hours as needed for mild pain.    [provider]  cephALEXin (KEFLEX) 500 MG capsule Take 1 capsule (500 mg total) by mouth 4 (four) times daily. 05/15/17   Lorre Nick, MD  doxycycline (VIBRAMYCIN) 100 MG capsule Take 1 capsule (100 mg total) by mouth 2 (two) times daily. 12/25/17   Ralphine Hinks, Mayer Masker, MD  insulin NPH-regular (NOVOLIN 70/30) (70-30) 100 UNIT/ML injection Inject 13 Units into the skin 2 (two) times daily with a meal. Patient taking differently: Inject 10 Units into the skin daily with supper.  04/08/13   Dhungel, Theda Belfast, MD  ondansetron (ZOFRAN ODT) 8 MG disintegrating tablet Take 1 tablet (8 mg total) by mouth every 8 (eight) hours as needed for nausea or vomiting. 05/12/17   Azalia Bilis, MD  oxyCODONE-acetaminophen (PERCOCET/ROXICET) 5-325 MG tablet Take 1-2 tablets by mouth every 4 (four) hours as needed for severe pain. 05/15/17   Lorre Nick, MD  pantoprazole (PROTONIX) 20 MG tablet Take 1 tablet (20 mg total) by mouth daily. 05/26/17   Rolan Bucco, MD  promethazine (PHENERGAN) 25 MG tablet Take 1  tablet (25 mg total) by mouth every 6 (six) hours as needed for nausea or vomiting. 05/26/17   Rolan Bucco, MD    Family History Family History  Problem Relation Age of Onset  . Cancer Mother   . Diabetes Mother   . Hypertension Mother     Social History Social History   Tobacco Use  . Smoking status: Former Smoker    Packs/day: 0.50    Years: 2.00    Pack years: 1.00    Types: Cigarettes    Last attempt to quit: 07/22/2011    Years since quitting: 6.4  . Smokeless tobacco: Never Used  Substance Use Topics  . Alcohol use: Yes    Alcohol/week: 0.0 standard drinks     Comment: occ  . Drug use: No     Allergies   Vancomycin and Adhesive [tape]   Review of Systems Review of Systems  Constitutional: Negative for fever.  Respiratory: Negative for shortness of breath.   Cardiovascular: Negative for chest pain.  Gastrointestinal: Negative for abdominal pain.  Musculoskeletal:       Right calf pain  Skin: Positive for color change.  All other systems reviewed and are negative.    Physical Exam Updated Vital Signs BP 118/73   Pulse 95   Temp 99 F (37.2 C) (Oral)   Resp 16   Ht 1.549 m (5\' 1" )   Wt (!) 143.4 kg   SpO2 96%   BMI 59.73 kg/m   Physical Exam  Constitutional: She is oriented to person, place, and time. She appears well-developed and well-nourished.  Morbidly obese, nontoxic-appearing  HENT:  Head: Normocephalic and atraumatic.  Neck: Neck supple.  Cardiovascular: Normal rate, regular rhythm and normal heart sounds.  Pulmonary/Chest: Effort normal and breath sounds normal. No respiratory distress. She has no wheezes.  Abdominal: Soft. Bowel sounds are normal. There is no tenderness.  Musculoskeletal: She exhibits edema.  Plus bilateral lower extremity edema, tenderness to palpation of the medial aspect of the right calf, 2+ DP pulse  Neurological: She is alert and oriented to person, place, and time.  Skin: Skin is warm and dry.  Slight erythema noted right lower extremity to the mid shin and calf with associated warmth when compared to the left lower extremity  Psychiatric: She has a normal mood and affect.  Nursing note and vitals reviewed.    ED Treatments / Results  Labs (all labs ordered are listed, but only abnormal results are displayed) Labs Reviewed - No data to display  EKG None  Radiology US Venous Img Lower Unilateral Right  Result Date: 12/25/2017 CLINICAL DATA:  Right calf pain and erythema for 2 days EXAM: RIGHT LOWER EXTREMITY VENOUS DUPLEX ULTRASOUND TECHNIQUE: Doppler venous assessment of the  right lower extremity deep venous system was performed, including characterization of spectral flow, compressibility, and phasicity. COMPARISON:  None. FINDINGS: There is complete compressibility of the right common femoral, femoral, and popliteal veins. Doppler analysis demonstrates respiratory phasicity and augmentation of flow with calf compression. No obvious superficial vein or calf vein thrombosis. IMPRESSION: No evidence of right lower extremity DVT Electronically Signed   By: Jolaine Click M.D.   On: 12/25/2017 21:47    Procedures Procedures (including critical care time)  Medications Ordered in ED Medications  doxycycline (VIBRA-TABS) tablet 100 mg (100 mg Oral Given 12/25/17 2338)  HYDROcodone-acetaminophen (NORCO/VICODIN) 5-325 MG per tablet 1 tablet (1 tablet Oral Given 12/25/17 2338)     Initial Impression / Assessment and Plan /  ED Course  I have reviewed the triage vital signs and the nursing notes.  Pertinent labs & imaging results that were available during my care of the patient were reviewed by me and considered in my medical decision making (see chart for details).     Patient presents with atraumatic right calf pain.  She is overall nontoxic-appearing vital signs are reassuring.  She does have some mild erythema and warmth to the right lower extremity when compared to the left.  Ultrasound is negative for DVT.  Suspect she may have early cellulitis.  She denies systemic symptoms or fever.  Do not feel she needs work-up at this time.  We will treat with doxycycline.  Follow-up with PCP recommended.  After history, exam, and medical workup I feel the patient has been appropriately medically screened and is safe for discharge home. Pertinent diagnoses were discussed with the patient. Patient was given return precautions.  Final Clinical Impressions(s) / ED Diagnoses   Final diagnoses:  Right calf pain  Cellulitis of right lower extremity    ED Discharge Orders          Ordered    doxycycline (VIBRAMYCIN) 100 MG capsule  2 times daily     12/25/17 2358           Niall Illes, Mayer Masker, MD 12/25/17 2359

## 2017-12-25 NOTE — ED Notes (Signed)
Pt. Has noted edema in each lower leg at the ankle.  Pt. Reports she has pain in the R leg tonight.

## 2017-12-25 NOTE — ED Triage Notes (Signed)
Pt c/o right lower leg pain in calf since yesterday, no recent long trips, no OCP, non-smoker

## 2018-01-02 ENCOUNTER — Encounter (HOSPITAL_BASED_OUTPATIENT_CLINIC_OR_DEPARTMENT_OTHER): Payer: Self-pay

## 2018-01-02 ENCOUNTER — Other Ambulatory Visit: Payer: Self-pay

## 2018-01-02 ENCOUNTER — Inpatient Hospital Stay (HOSPITAL_BASED_OUTPATIENT_CLINIC_OR_DEPARTMENT_OTHER)
Admission: EM | Admit: 2018-01-02 | Discharge: 2018-01-04 | DRG: 603 | Disposition: A | Discharge status: Home / Self Care | Payer: BLUE CROSS/BLUE SHIELD | Attending: Internal Medicine | Admitting: Internal Medicine

## 2018-01-02 DIAGNOSIS — K219 Gastro-esophageal reflux disease without esophagitis: Secondary | ICD-10-CM | POA: Diagnosis present

## 2018-01-02 DIAGNOSIS — Z8249 Family history of ischemic heart disease and other diseases of the circulatory system: Secondary | ICD-10-CM

## 2018-01-02 DIAGNOSIS — Z79899 Other long term (current) drug therapy: Secondary | ICD-10-CM

## 2018-01-02 DIAGNOSIS — I890 Lymphedema, not elsewhere classified: Secondary | ICD-10-CM | POA: Diagnosis present

## 2018-01-02 DIAGNOSIS — Z833 Family history of diabetes mellitus: Secondary | ICD-10-CM

## 2018-01-02 DIAGNOSIS — E669 Obesity, unspecified: Secondary | ICD-10-CM | POA: Diagnosis present

## 2018-01-02 DIAGNOSIS — R739 Hyperglycemia, unspecified: Secondary | ICD-10-CM | POA: Diagnosis not present

## 2018-01-02 DIAGNOSIS — Z881 Allergy status to other antibiotic agents status: Secondary | ICD-10-CM

## 2018-01-02 DIAGNOSIS — Z713 Dietary counseling and surveillance: Secondary | ICD-10-CM | POA: Diagnosis not present

## 2018-01-02 DIAGNOSIS — Z981 Arthrodesis status: Secondary | ICD-10-CM

## 2018-01-02 DIAGNOSIS — Z720 Tobacco use: Secondary | ICD-10-CM | POA: Diagnosis not present

## 2018-01-02 DIAGNOSIS — Z23 Encounter for immunization: Secondary | ICD-10-CM | POA: Diagnosis not present

## 2018-01-02 DIAGNOSIS — E1165 Type 2 diabetes mellitus with hyperglycemia: Secondary | ICD-10-CM | POA: Diagnosis present

## 2018-01-02 DIAGNOSIS — Z9049 Acquired absence of other specified parts of digestive tract: Secondary | ICD-10-CM

## 2018-01-02 DIAGNOSIS — Z91048 Other nonmedicinal substance allergy status: Secondary | ICD-10-CM

## 2018-01-02 DIAGNOSIS — L02415 Cutaneous abscess of right lower limb: Secondary | ICD-10-CM | POA: Diagnosis not present

## 2018-01-02 DIAGNOSIS — Z794 Long term (current) use of insulin: Secondary | ICD-10-CM | POA: Diagnosis not present

## 2018-01-02 DIAGNOSIS — E119 Type 2 diabetes mellitus without complications: Secondary | ICD-10-CM

## 2018-01-02 DIAGNOSIS — L03115 Cellulitis of right lower limb: Secondary | ICD-10-CM | POA: Diagnosis not present

## 2018-01-02 DIAGNOSIS — Z6841 Body Mass Index (BMI) 40.0 and over, adult: Secondary | ICD-10-CM | POA: Diagnosis not present

## 2018-01-02 LAB — CBC WITH DIFFERENTIAL/PLATELET
Basophils Absolute: 0.1 10*3/uL (ref 0.0–0.1)
Basophils Relative: 1 %
Eosinophils Absolute: 0.4 10*3/uL (ref 0.0–0.7)
Eosinophils Relative: 4 %
HCT: 42.2 % (ref 36.0–46.0)
Hemoglobin: 14 g/dL (ref 12.0–15.0)
Lymphocytes Relative: 20 %
Lymphs Abs: 2.3 10*3/uL (ref 0.7–4.0)
MCH: 28.1 pg (ref 26.0–34.0)
MCHC: 33.2 g/dL (ref 30.0–36.0)
MCV: 84.6 fL (ref 78.0–100.0)
Monocytes Absolute: 1 10*3/uL (ref 0.1–1.0)
Monocytes Relative: 9 %
Neutro Abs: 7.3 10*3/uL (ref 1.7–7.7)
Neutrophils Relative %: 66 %
Platelets: 340 10*3/uL (ref 150–400)
RBC: 4.99 MIL/uL (ref 3.87–5.11)
RDW: 13.7 % (ref 11.5–15.5)
WBC: 11 10*3/uL — ABNORMAL HIGH (ref 4.0–10.5)

## 2018-01-02 LAB — BASIC METABOLIC PANEL
Anion gap: 11 (ref 5–15)
BUN: 10 mg/dL (ref 6–20)
CO2: 25 mmol/L (ref 22–32)
Calcium: 8.7 mg/dL — ABNORMAL LOW (ref 8.9–10.3)
Chloride: 97 mmol/L — ABNORMAL LOW (ref 98–111)
Creatinine, Ser: 0.7 mg/dL (ref 0.44–1.00)
GFR calc Af Amer: >60 mL/min (ref >60)
GFR calc non Af Amer: >60 mL/min (ref >60)
Glucose, Bld: 504 mg/dL (ref 70–99)
Potassium: 3.9 mmol/L (ref 3.5–5.1)
Sodium: 133 mmol/L — ABNORMAL LOW (ref 135–145)

## 2018-01-02 LAB — CBG MONITORING, ED
Glucose-Capillary: 414 mg/dL — ABNORMAL HIGH (ref 70–99)
Glucose-Capillary: 357 mg/dL — ABNORMAL HIGH (ref 70–99)

## 2018-01-02 LAB — URINALYSIS, ROUTINE W REFLEX MICROSCOPIC
Bilirubin Urine: NEGATIVE
Glucose, UA: >=500 mg/dL — AB
Hgb urine dipstick: NEGATIVE
Ketones, ur: NEGATIVE mg/dL
Leukocytes, UA: NEGATIVE
Nitrite: NEGATIVE
Protein, ur: NEGATIVE mg/dL
Specific Gravity, Urine: 1.01 (ref 1.005–1.030)
pH: 5.5 (ref 5.0–8.0)

## 2018-01-02 LAB — URINALYSIS, MICROSCOPIC (REFLEX): RBC / HPF: NONE SEEN RBC/hpf (ref 0–5)

## 2018-01-02 LAB — I-STAT CG4 LACTIC ACID, ED: Lactic Acid, Venous: 2.86 mmol/L (ref 0.5–1.9)

## 2018-01-02 LAB — PREGNANCY, URINE: Preg Test, Ur: NEGATIVE

## 2018-01-02 MED ORDER — DEXTROSE-NACL 5-0.45 % IV SOLN: INTRAVENOUS | Status: DC

## 2018-01-02 MED ORDER — SODIUM CHLORIDE 0.9 % IV BOLUS
1000.0000 mL | Freq: Once | INTRAVENOUS | Status: AC
Start: 2018-01-02 — End: 2018-01-02
  Administered 2018-01-02: 1000 mL via INTRAVENOUS

## 2018-01-02 MED ORDER — INSULIN NPH (HUMAN) (ISOPHANE) 100 UNIT/ML ~~LOC~~ SUSP
10.0000 [IU] | Freq: Once | SUBCUTANEOUS | Status: AC
  Administered 2018-01-02: 10 [IU] via SUBCUTANEOUS
  Filled 2018-01-02: qty 10

## 2018-01-02 MED ORDER — SODIUM CHLORIDE 0.9 % IV SOLN
INTRAVENOUS | Status: DC | PRN
  Administered 2018-01-02: 500 mL via INTRAVENOUS

## 2018-01-02 MED ORDER — MORPHINE SULFATE (PF) 4 MG/ML IV SOLN
4.0000 mg | Freq: Once | INTRAVENOUS | Status: AC
  Administered 2018-01-02: 4 mg via INTRAVENOUS
  Filled 2018-01-02: qty 1

## 2018-01-02 MED ORDER — SODIUM CHLORIDE 0.9 % IV BOLUS: 500.0000 mL | Freq: Once | INTRAVENOUS | Status: DC

## 2018-01-02 MED ORDER — SODIUM CHLORIDE 0.9 % IV SOLN
INTRAVENOUS | Status: DC
  Administered 2018-01-02: 3.5 [IU]/h via INTRAVENOUS
  Filled 2018-01-02 (×2): qty 1

## 2018-01-02 MED ORDER — PIPERACILLIN-TAZOBACTAM 3.375 G IVPB 30 MIN
3.3750 g | Freq: Once | INTRAVENOUS | Status: AC
  Administered 2018-01-02: 3.375 g via INTRAVENOUS
  Filled 2018-01-02 (×2): qty 50

## 2018-01-02 NOTE — ED Provider Notes (Signed)
[facility] EMERGENCY DEPARTMENT Provider Note   CSN: [id number] Arrival date & time: 01/02/18  2002     History   Chief Complaint Chief Complaint  Patient presents with  . Cellulitis    HPI [name] is a 47 y.o. female with history of diabetes, cellulitis, GERD, obesity, who presents with ongoing right lower extremity pain and redness after treatment with doxycycline for cellulitis on 12/25/2017.  Patient reports having history of cellulitis in the same area and having to be hospitalized.  Patient reports a heat and pain from the area.  She reports fever at home as well as nausea and vomiting intermittently.  She denies any abdominal pain, chest pain, shortness of breath.  She has been taking her insulin as prescribed at home with dinner.  She has any injury to the area.  HPI  Past Medical History:  Diagnosis Date  . Cellulitis 07/2011  . Diabetes mellitus    dx 2016  . GERD (gastroesophageal reflux disease)     Patient Active Problem List   Diagnosis Date Noted  . Cellulitis and abscess of right leg 01/02/2018  . Bilateral carpal tunnel syndrome 07/06/2016  . Left elbow pain 02/17/2016  . Tobacco abuse 10/29/2015  . Injury of right hand 05/19/2015  . Cervical disc disorder with radiculopathy of cervical region 10/10/2013  . Type II or unspecified type diabetes mellitus without mention of complication, uncontrolled 04/07/2013  . Pyelonephritis 04/06/2013  . RLQ abdominal pain 04/06/2013  . Fever 09/07/2012  . Headache(784.0) 09/07/2012  . Cellulitis 08/05/2011  . Peripheral edema 08/05/2011  . GERD (gastroesophageal reflux disease) 08/05/2011  . Obesity 08/05/2011    Past Surgical History:  Procedure Laterality Date  . ABLATION    . CARPAL TUNNEL RELEASE Left 09/13/2016   Procedure: LEFT CARPAL TUNNEL RELEASE;  Surgeon: Cindee Salt, MD;  Location: [facility] OR;  Service: Orthopedics;  Laterality: Left;  . CHOLECYSTECTOMY    . KNEE ARTHROSCOPY    .  LAPAROSCOPIC TUBAL LIGATION  04/29/2011   Procedure: LAPAROSCOPIC TUBAL LIGATION;  Surgeon: Turner Daniels, MD;  Location: [facility] ORS;  Service: Gynecology;  Laterality: Bilateral;  with Filshie Clips  . ROTATOR CUFF REPAIR    . TUBAL LIGATION       OB History   None      Home Medications    Prior to Admission medications   Medication Sig Start Date End Date Taking? Authorizing Provider  acetaminophen (TYLENOL) 500 MG tablet Take 1,000 mg by mouth every 6 (six) hours as needed for mild pain.    [provider]  cephALEXin (KEFLEX) 500 MG capsule Take 1 capsule (500 mg total) by mouth 4 (four) times daily. 05/15/17   Lorre Nick, MD  doxycycline (VIBRAMYCIN) 100 MG capsule Take 1 capsule (100 mg total) by mouth 2 (two) times daily. 12/25/17   Horton, Mayer Masker, MD  insulin NPH-regular (NOVOLIN 70/30) (70-30) 100 UNIT/ML injection Inject 13 Units into the skin 2 (two) times daily with a meal. Patient taking differently: Inject 10 Units into the skin daily with supper.  04/08/13   Dhungel, Theda Belfast, MD  ondansetron (ZOFRAN ODT) 8 MG disintegrating tablet Take 1 tablet (8 mg total) by mouth every 8 (eight) hours as needed for nausea or vomiting. 05/12/17   Azalia Bilis, MD  oxyCODONE-acetaminophen (PERCOCET/ROXICET) 5-325 MG tablet Take 1-2 tablets by mouth every 4 (four) hours as needed for severe pain. 05/15/17   Lorre Nick, MD  pantoprazole (PROTONIX) 20 MG tablet Take 1  tablet (20 mg total) by mouth daily. 05/26/17   Rolan BuccoBelfi, Melanie, MD  promethazine (PHENERGAN) 25 MG tablet Take 1 tablet (25 mg total) by mouth every 6 (six) hours as needed for nausea or vomiting. 05/26/17   Rolan BuccoBelfi, Melanie, MD    Family History Family History  Problem Relation Age of Onset  . Cancer Mother   . Diabetes Mother   . Hypertension Mother     Social History Social History   Tobacco Use  . Smoking status: Former Smoker    Packs/day: 0.50    Years: 2.00    Pack years: 1.00    Types: Cigarettes     Last attempt to quit: 07/22/2011    Years since quitting: 6.4  . Smokeless tobacco: Never Used  Substance Use Topics  . Alcohol use: Yes    Alcohol/week: 0.0 standard drinks    Comment: occ  . Drug use: No     Allergies   Vancomycin and Adhesive [tape]   Review of Systems Review of Systems  Constitutional: Positive for fever. Negative for chills.  HENT: Negative for facial swelling and sore throat.   Respiratory: Negative for shortness of breath.   Cardiovascular: Negative for chest pain.  Gastrointestinal: Positive for nausea and vomiting. Negative for abdominal pain.  Genitourinary: Negative for dysuria.  Musculoskeletal: Negative for back pain.  Skin: Positive for color change and rash. Negative for wound.  Neurological: Negative for headaches.  Psychiatric/Behavioral: The patient is not nervous/anxious.      Physical Exam Updated Vital Signs BP (!) 142/88 (BP Location: Left Wrist)   Pulse 99   Temp 99 F (37.2 C) (Oral)   Resp 18   Ht 5\' 1"  (1.549 m)   Wt (!) 136.5 kg   SpO2 97%   BMI 56.87 kg/m   Physical Exam  Constitutional: She appears well-developed and well-nourished. No distress.  Morbidly obese  HENT:  Head: Normocephalic and atraumatic.  Mouth/Throat: Oropharynx is clear and moist. No oropharyngeal exudate.  Eyes: Pupils are equal, round, and reactive to light. Conjunctivae are normal. Right eye exhibits no discharge. Left eye exhibits no discharge. No scleral icterus.  Neck: Normal range of motion. Neck supple. No thyromegaly present.  Cardiovascular: Regular rhythm, normal heart sounds and intact distal pulses. Exam reveals no gallop and no friction rub.  No murmur heard. Pulmonary/Chest: Effort normal and breath sounds normal. No stridor. No respiratory distress. She has no wheezes. She has no rales.  Abdominal: Soft. Bowel sounds are normal. She exhibits no distension. There is no tenderness. There is no rebound and no guarding.  Musculoskeletal:  She exhibits no edema.  Lymphadenopathy:    She has no cervical adenopathy.  Neurological: She is alert. Coordination normal.  Skin: Skin is warm and dry. No rash noted. She is not diaphoretic. No pallor.  Area of tenderness, warmth, erythema to right shin wrapping around to the calf  Psychiatric: She has a normal mood and affect.  Nursing note and vitals reviewed.    ED Treatments / Results  Labs (all labs ordered are listed, but only abnormal results are displayed) Labs Reviewed  BASIC METABOLIC PANEL - Abnormal; Notable for the following components:      Result Value   Sodium 133 (*)    Chloride 97 (*)    Glucose, Bld 504 (*)    Calcium 8.7 (*)    All other components within normal limits  CBC WITH DIFFERENTIAL/PLATELET - Abnormal; Notable for the following components:   WBC 11.0 (*)  All other components within normal limits  URINALYSIS, ROUTINE W REFLEX MICROSCOPIC - Abnormal; Notable for the following components:   Glucose, UA >=500 (*)    All other components within normal limits  URINALYSIS, MICROSCOPIC (REFLEX) - Abnormal; Notable for the following components:   Bacteria, UA RARE (*)    All other components within normal limits  I-STAT CG4 LACTIC ACID, ED - Abnormal; Notable for the following components:   Lactic Acid, Venous 2.86 (*)    All other components within normal limits  CBG MONITORING, ED - Abnormal; Notable for the following components:   Glucose-Capillary 414 (*)    All other components within normal limits  CBG MONITORING, ED - Abnormal; Notable for the following components:   Glucose-Capillary 357 (*)    All other components within normal limits  CULTURE, BLOOD (ROUTINE X 2)  CULTURE, BLOOD (ROUTINE X 2)  PREGNANCY, URINE  I-STAT CG4 LACTIC ACID, ED    EKG None  Radiology No results found.  Procedures Procedures (including critical care time)  Medications Ordered in ED Medications  dextrose 5 %-0.45 % sodium chloride infusion (has no  administration in time range)  insulin regular (NOVOLIN R,HUMULIN R) 100 Units in sodium chloride 0.9 % 100 mL (1 Units/mL) infusion (0 Units/hr Intravenous Stopped 01/02/18 2316)  0.9 %  sodium chloride infusion (500 mLs Intravenous New Bag/Given 01/02/18 2212)  0.9 %  sodium chloride infusion (500 mLs Intravenous New Bag/Given 01/02/18 2210)  morphine 4 MG/ML injection 4 mg (has no administration in time range)  insulin NPH Human (HUMULIN N,NOVOLIN N) injection 10 Units (has no administration in time range)  sodium chloride 0.9 % bolus 1,000 mL ( Intravenous Paused 01/02/18 2200)  piperacillin-tazobactam (ZOSYN) IVPB 3.375 g (3.375 g Intravenous New Bag/Given 01/02/18 2211)     Initial Impression / Assessment and Plan / ED Course  I have reviewed the triage vital signs and the nursing notes.  Pertinent labs & imaging results that were available during my care of the patient were reviewed by me and considered in my medical decision making (see chart for details).     Patient presents with refractory cellulitis after treatment with doxycycline.  Patient also found to be hyperglycemic but no signs of DKA.  She has leukocytosis at 11.0.  Initial lactate 2.86.  Blood cultures pending.  Fluids and antibiotics initiated in the ED as well as glucose stabilizer.  Patient taken off insulin drip and given long-acting insulin which patient did not have this evening, she was in the ED.  I spoke with Dr. Hanley Hays with [facility] at [facility] who accepts patient for admission.  Patient will be transferred via [facility].  Patient and her husband understand and agree with plan. I discussed patient case with Dr. Lynelle Doctor who guided the patient's management and agrees with plan.   Final Clinical Impressions(s) / ED Diagnoses   Final diagnoses:  Cellulitis of right lower extremity  Hyperglycemia    ED Discharge Orders    None       Emi Holes, PA-C 01/02/18 2329    Linwood Dibbles, MD 01/03/18 1454

## 2018-01-02 NOTE — ED Triage Notes (Signed)
Pt states she was dx with cellulitis to right LE on 9/12-states leg is no better-NAD-steady gait

## 2018-01-02 NOTE — ED Notes (Signed)
Date and time results received: 01/02/18/ 20:55 (use smartphrase ".now" to insert current time)  Test: ISTAT-Lactic Acid Critical Value: 2.06  Name of Provider Notified: Alexandria Law-PA-C  Orders Received? Or Actions Taken?:

## 2018-01-02 NOTE — ED Notes (Signed)
[facility] arrived to transport pt to [facility].  

## 2018-01-02 NOTE — ED Notes (Signed)
CBG 357.  

## 2018-01-02 NOTE — ED Notes (Addendum)
Per EDP, insulin drip to be d/c. 10units SQ novolin ordered and administered.

## 2018-01-02 NOTE — ED Notes (Signed)
Date and time results received: 01/02/18 2120 (use smartphrase ".now" to insert current time)  Test: glucose Critical Value: 504  Name of Provider Notified: A Law PA  Orders Received? Or Actions Taken?: no new orders

## 2018-01-03 DIAGNOSIS — L03115 Cellulitis of right lower limb: Principal | ICD-10-CM

## 2018-01-03 DIAGNOSIS — L02415 Cutaneous abscess of right lower limb: Secondary | ICD-10-CM

## 2018-01-03 LAB — COMPREHENSIVE METABOLIC PANEL
ALT: 20 U/L (ref 0–44)
AST: 18 U/L (ref 15–41)
Albumin: 3.1 g/dL — ABNORMAL LOW (ref 3.5–5.0)
Alkaline Phosphatase: 87 U/L (ref 38–126)
Anion gap: 7 (ref 5–15)
BUN: 11 mg/dL (ref 6–20)
CO2: 26 mmol/L (ref 22–32)
Calcium: 8.7 mg/dL — ABNORMAL LOW (ref 8.9–10.3)
Chloride: 103 mmol/L (ref 98–111)
Creatinine, Ser: 0.57 mg/dL (ref 0.44–1.00)
GFR calc Af Amer: >60 mL/min (ref >60)
GFR calc non Af Amer: >60 mL/min (ref >60)
Glucose, Bld: 347 mg/dL — ABNORMAL HIGH (ref 70–99)
Potassium: 3.7 mmol/L (ref 3.5–5.1)
Sodium: 136 mmol/L (ref 135–145)
Total Bilirubin: 0.6 mg/dL (ref 0.3–1.2)
Total Protein: 6.5 g/dL (ref 6.5–8.1)

## 2018-01-03 LAB — GLUCOSE, CAPILLARY
Glucose-Capillary: 134 mg/dL — ABNORMAL HIGH (ref 70–99)
Glucose-Capillary: 181 mg/dL — ABNORMAL HIGH (ref 70–99)
Glucose-Capillary: 185 mg/dL — ABNORMAL HIGH (ref 70–99)
Glucose-Capillary: 186 mg/dL — ABNORMAL HIGH (ref 70–99)
Glucose-Capillary: 245 mg/dL — ABNORMAL HIGH (ref 70–99)
Glucose-Capillary: 322 mg/dL — ABNORMAL HIGH (ref 70–99)

## 2018-01-03 LAB — CBC
HCT: 38.9 % (ref 36.0–46.0)
Hemoglobin: 12.7 g/dL (ref 12.0–15.0)
MCH: 27.9 pg (ref 26.0–34.0)
MCHC: 32.6 g/dL (ref 30.0–36.0)
MCV: 85.5 fL (ref 78.0–100.0)
Platelets: 332 10*3/uL (ref 150–400)
RBC: 4.55 MIL/uL (ref 3.87–5.11)
RDW: 13.6 % (ref 11.5–15.5)
WBC: 10.4 10*3/uL (ref 4.0–10.5)

## 2018-01-03 LAB — HIV ANTIBODY (ROUTINE TESTING W REFLEX): HIV Screen 4th Generation wRfx: NONREACTIVE

## 2018-01-03 MED ORDER — ACETAMINOPHEN 500 MG PO TABS: 1000.0000 mg | ORAL_TABLET | Freq: Four times a day (QID) | ORAL | Status: DC | PRN

## 2018-01-03 MED ORDER — ENOXAPARIN SODIUM 60 MG/0.6ML ~~LOC~~ SOLN
60.0000 mg | Freq: Every day | SUBCUTANEOUS | Status: DC
  Administered 2018-01-03: 60 mg via SUBCUTANEOUS
  Filled 2018-01-03: qty 0.6

## 2018-01-03 MED ORDER — ACETAMINOPHEN 325 MG PO TABS: 650.0000 mg | ORAL_TABLET | Freq: Four times a day (QID) | ORAL | Status: DC | PRN

## 2018-01-03 MED ORDER — PIPERACILLIN-TAZOBACTAM 3.375 G IVPB 30 MIN
3.3750 g | Freq: Once | INTRAVENOUS | Status: AC
  Administered 2018-01-03: 3.375 g via INTRAVENOUS
  Filled 2018-01-03: qty 50

## 2018-01-03 MED ORDER — ONDANSETRON 4 MG PO TBDP: 8.0000 mg | ORAL_TABLET | Freq: Three times a day (TID) | ORAL | Status: DC | PRN

## 2018-01-03 MED ORDER — NICOTINE 21 MG/24HR TD PT24: 21.0000 mg | MEDICATED_PATCH | Freq: Every day | TRANSDERMAL | Status: DC

## 2018-01-03 MED ORDER — INFLUENZA VAC SPLIT QUAD 0.5 ML IM SUSY
0.5000 mL | PREFILLED_SYRINGE | INTRAMUSCULAR | Status: AC
  Administered 2018-01-04: 0.5 mL via INTRAMUSCULAR
  Filled 2018-01-03: qty 0.5

## 2018-01-03 MED ORDER — INSULIN ASPART 100 UNIT/ML ~~LOC~~ SOLN
0.0000 [IU] | Freq: Three times a day (TID) | SUBCUTANEOUS | Status: DC
  Administered 2018-01-03: 7 [IU] via SUBCUTANEOUS
  Administered 2018-01-03: 4 [IU] via SUBCUTANEOUS
  Administered 2018-01-03: 3 [IU] via SUBCUTANEOUS
  Administered 2018-01-04: 4 [IU] via SUBCUTANEOUS
  Administered 2018-01-04: 7 [IU] via SUBCUTANEOUS

## 2018-01-03 MED ORDER — INSULIN ASPART PROT & ASPART (70-30 MIX) 100 UNIT/ML ~~LOC~~ SUSP
15.0000 [IU] | Freq: Two times a day (BID) | SUBCUTANEOUS | Status: DC
  Administered 2018-01-03 – 2018-01-04 (×3): 15 [IU] via SUBCUTANEOUS
  Filled 2018-01-03: qty 10

## 2018-01-03 MED ORDER — VANCOMYCIN HCL 10 G IV SOLR
2000.0000 mg | Freq: Once | INTRAVENOUS | Status: AC
  Administered 2018-01-03: 2000 mg via INTRAVENOUS
  Filled 2018-01-03: qty 2000

## 2018-01-03 MED ORDER — INSULIN ASPART 100 UNIT/ML ~~LOC~~ SOLN
0.0000 [IU] | Freq: Every day | SUBCUTANEOUS | Status: DC
  Administered 2018-01-03: 4 [IU] via SUBCUTANEOUS

## 2018-01-03 MED ORDER — MORPHINE SULFATE (PF) 4 MG/ML IV SOLN: 4.0000 mg | INTRAVENOUS | Status: DC | PRN

## 2018-01-03 MED ORDER — SULFAMETHOXAZOLE-TRIMETHOPRIM 800-160 MG PO TABS
1.0000 | ORAL_TABLET | Freq: Two times a day (BID) | ORAL | Status: DC
  Administered 2018-01-03 – 2018-01-04 (×2): 1 via ORAL
  Filled 2018-01-03 (×2): qty 1

## 2018-01-03 MED ORDER — OXYCODONE-ACETAMINOPHEN 5-325 MG PO TABS
1.0000 | ORAL_TABLET | ORAL | Status: DC | PRN
  Administered 2018-01-03: 1 via ORAL
  Filled 2018-01-03: qty 1

## 2018-01-03 MED ORDER — VANCOMYCIN HCL IN DEXTROSE 1-5 GM/200ML-% IV SOLN
1000.0000 mg | Freq: Two times a day (BID) | INTRAVENOUS | Status: DC
  Administered 2018-01-03: 1000 mg via INTRAVENOUS
  Filled 2018-01-03: qty 200

## 2018-01-03 MED ORDER — PIPERACILLIN-TAZOBACTAM 3.375 G IVPB
3.3750 g | Freq: Three times a day (TID) | INTRAVENOUS | Status: DC
  Administered 2018-01-03 (×2): 3.375 g via INTRAVENOUS
  Filled 2018-01-03 (×3): qty 50

## 2018-01-03 MED ORDER — DIPHENHYDRAMINE HCL 50 MG/ML IJ SOLN
25.0000 mg | Freq: Two times a day (BID) | INTRAMUSCULAR | Status: DC
  Administered 2018-01-03: 25 mg via INTRAVENOUS
  Filled 2018-01-03: qty 1

## 2018-01-03 MED ORDER — PROMETHAZINE HCL 25 MG PO TABS: 25.0000 mg | ORAL_TABLET | Freq: Four times a day (QID) | ORAL | Status: DC | PRN

## 2018-01-03 MED ORDER — SODIUM CHLORIDE 0.9 % IV BOLUS: 500.0000 mL | Freq: Once | INTRAVENOUS | Status: DC

## 2018-01-03 MED ORDER — SODIUM CHLORIDE 0.9 % IV SOLN
INTRAVENOUS | Status: DC
  Administered 2018-01-03: 01:00:00 via INTRAVENOUS

## 2018-01-03 MED ORDER — PANTOPRAZOLE SODIUM 20 MG PO TBEC
20.0000 mg | DELAYED_RELEASE_TABLET | Freq: Every day | ORAL | Status: DC
  Administered 2018-01-03 – 2018-01-04 (×2): 20 mg via ORAL
  Filled 2018-01-03 (×2): qty 1

## 2018-01-03 MED ORDER — VANCOMYCIN HCL IN DEXTROSE 1-5 GM/200ML-% IV SOLN: 1000.0000 mg | Freq: Once | INTRAVENOUS | Status: DC

## 2018-01-03 MED ORDER — DIPHENHYDRAMINE HCL 50 MG/ML IJ SOLN
25.0000 mg | Freq: Once | INTRAMUSCULAR | Status: AC
  Administered 2018-01-03: 25 mg via INTRAVENOUS
  Filled 2018-01-03: qty 1

## 2018-01-03 NOTE — Progress Notes (Addendum)
Pharmacy Antibiotic Note  Brooke BurtonJennifer Peterson is a 47 y.o. female admitted on 01/02/2018 with cellulitis.  Pharmacy has been consulted for Vancomycin and Zosyn dosing.  Plan: Zosyn 3.375g IV Q8H infused over 4hrs.   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 Mikeal Peterson  Goal AUC = 400 - 500 for all indications, except meningitis (goal AUC > 500 and Cmin 15-20 mcg/mL)   Height: 5\' 1"  (154.9 cm) Weight: (!) 301 lb 13 oz (136.9 kg) IBW/kg (Calculated) : 47.8  Temp (24hrs), Avg:98.6 F (37 C), Min:98.2 F (36.8 C), Max:99 F (37.2 C)  Recent Labs  Lab 01/02/18 2047 01/02/18 2052 01/03/18 0058  WBC 11.0*  --  10.4  CREATININE 0.70  --  0.57  LATICACIDVEN  --  2.86*  --     Estimated Creatinine Clearance: 112 mL/min (by C-G formula based on SCr of 0.57 mg/dL).    Allergies  Allergen Reactions  . Vancomycin Other (See Comments)    Red man syndrome  . Adhesive [Tape] Itching and Rash    Antimicrobials this admission: Vancomycin 01/03/2018 >> Zosyn 01/03/2018 >>   Dose adjustments this admission: -  Microbiology results: -  Thank you for allowing pharmacy to be a part of this patient's care.  Brooke DavidsonGrimsley Peterson, [name] [name] 01/03/2018 2:52 AM

## 2018-01-03 NOTE — Progress Notes (Signed)
Inpatient Diabetes Program Recommendations  AACE/ADA: New Consensus Statement on Inpatient Glycemic Control (2015)  Target Ranges:  Prepandial:   less than 140 mg/dL      Peak postprandial:   less than 180 mg/dL (1-2 hours)      Critically ill patients:  140 - 180 mg/dL   Lab Results  Component Value Date   GLUCAP 134 (H) 01/03/2018   HGBA1C 11.6 (H) 04/06/2013    Review of Glycemic Control  Diabetes history: DM2 Outpatient Diabetes medications: Novolin 70/30 15 units bid Current orders for Inpatient glycemic control: Novolog 70/30 15 units bid, Novolog 0-20 units tidwc and hs.  No updated HgbA1C. Pt ate meal prior to presenting to [facility] and did not take insulin.  Poor po intake.  Inpatient Diabetes Program Recommendations:     HgbA1C to assess glycemic control prior to hospitalization.  Will continue to follow.  Thank you. Ailene Ardshonda Keiyon Plack, RD, LDN, CDE Inpatient Diabetes Coordinator [phone]

## 2018-01-03 NOTE — H&P (Signed)
History and Physical   [name] ZOX:[id number] DOB: 12-25-1970 DOA: 01/02/2018  Referring MD/NP/PA: Dr. Linwood DibblesJon Knapp  PCP: Smothers, Cathleen Cortieborah N, NP   Outpatient Specialists: None  Patient coming from: [facility]  Chief Complaint: Right lower extremity pain, redness and swelling  HPI: [name] is a 47 y.o. female with medical history significant of insulin-dependent diabetes, morbid obesity, GERD, recurrent cellulitis with mild lymphedema of bilateral lower extremities who [facility] with complaint of right lower extremity pain tenderness and redness.  Patient has had problem with pain whenever she tried to walk.  The leg is also swollen more than usual.  She was seen and evaluated.  Patient has been on doxycycline Keflex and pain management at home but failed treatment.  She was initiated on IV Zosyn and transferred for inpatient care.  Her blood sugar also was found to be 504.  Patient reports that she ate before coming to the ER and did not take her insulin.  No evidence of DKA.  She is also hemodynamically stable.  Lactic acid was elevated at 2.86.  She has been admitted with significant cellulitis with no obvious abscess.  ED Course: Temperature is 99, blood pressure is 142/88, pulse 111 respiratory 22 and oxygen sats 99% room air.  She has a white count of 11 hemoglobin 14 and platelets 340.  Sodium is 133, potassium 3.9, chloride 97, BUN 10 and creatinine 0.70.  Calcium is 8.7.  Glucose 504 lactic acid 2.86.  Urinalysis is negative except for glucosuria.  Blood cultures is obtained and patient given a dose of Zosyn and transferred for inpatient care.  Review of Systems: As per HPI otherwise 10 point review of systems negative.    Past Medical History:  Diagnosis Date  . Cellulitis 07/2011  . Diabetes mellitus    dx 2016  . GERD (gastroesophageal reflux disease)     Past Surgical History:  Procedure Laterality Date  . ABLATION    . CARPAL  TUNNEL RELEASE Left 09/13/2016   Procedure: LEFT CARPAL TUNNEL RELEASE;  Surgeon: Cindee SaltKuzma, Gary, MD;  Location: [facility] OR;  Service: Orthopedics;  Laterality: Left;  . CHOLECYSTECTOMY    . KNEE ARTHROSCOPY    . LAPAROSCOPIC TUBAL LIGATION  04/29/2011   Procedure: LAPAROSCOPIC TUBAL LIGATION;  Surgeon: Turner Danielsavid C Lowe, MD;  Location: [facility] ORS;  Service: Gynecology;  Laterality: Bilateral;  with Filshie Clips  . ROTATOR CUFF REPAIR    . TUBAL LIGATION       reports that she quit smoking about 6 years ago. Her smoking use included cigarettes. She has a 1.00 pack-year smoking history. She has never used smokeless tobacco. She reports that she drinks alcohol. She reports that she does not use drugs.  Allergies  Allergen Reactions  . Vancomycin Other (See Comments)    Red man syndrome  . Adhesive [Tape] Itching and Rash    Family History  Problem Relation Age of Onset  . Cancer Mother   . Diabetes Mother   . Hypertension Mother      Prior to Admission medications   Medication Sig Start Date End Date Taking? Authorizing Provider  acetaminophen (TYLENOL) 500 MG tablet Take 1,000 mg by mouth every 6 (six) hours as needed for mild pain.    [provider]  cephALEXin (KEFLEX) 500 MG capsule Take 1 capsule (500 mg total) by mouth 4 (four) times daily. 05/15/17   Lorre NickAllen, Anthony, MD  doxycycline (VIBRAMYCIN) 100 MG capsule Take 1  capsule (100 mg total) by mouth 2 (two) times daily. 12/25/17   Horton, Mayer Masker, MD  insulin NPH-regular (NOVOLIN 70/30) (70-30) 100 UNIT/ML injection Inject 13 Units into the skin 2 (two) times daily with a meal. Patient taking differently: Inject 10 Units into the skin daily with supper.  04/08/13   Dhungel, Theda Belfast, MD  ondansetron (ZOFRAN ODT) 8 MG disintegrating tablet Take 1 tablet (8 mg total) by mouth every 8 (eight) hours as needed for nausea or vomiting. 05/12/17   Azalia Bilis, MD  oxyCODONE-acetaminophen (PERCOCET/ROXICET) 5-325 MG tablet Take 1-2 tablets by  mouth every 4 (four) hours as needed for severe pain. 05/15/17   Lorre Nick, MD  pantoprazole (PROTONIX) 20 MG tablet Take 1 tablet (20 mg total) by mouth daily. 05/26/17   Rolan Bucco, MD  promethazine (PHENERGAN) 25 MG tablet Take 1 tablet (25 mg total) by mouth every 6 (six) hours as needed for nausea or vomiting. 05/26/17   Rolan Bucco, MD    Physical Exam: Vitals:   01/02/18 2010 01/02/18 2158 01/03/18 0011 01/03/18 0012  BP:  (!) 142/88 132/76   Pulse:  99 96   Resp:  18 (!) 22   Temp:   98.2 F (36.8 C)   TempSrc:   Oral   SpO2:  97% 99%   Weight: (!) 136.5 kg   (!) 136.9 kg  Height: 5\' 1"  (1.549 m)   5\' 1"  (1.549 m)      Constitutional: NAD, calm, comfortable Vitals:   01/02/18 2010 01/02/18 2158 01/03/18 0011 01/03/18 0012  BP:  (!) 142/88 132/76   Pulse:  99 96   Resp:  18 (!) 22   Temp:   98.2 F (36.8 C)   TempSrc:   Oral   SpO2:  97% 99%   Weight: (!) 136.5 kg   (!) 136.9 kg  Height: 5\' 1"  (1.549 m)   5\' 1"  (1.549 m)   Awake alert oriented, stable no acute distress Eyes: PERRL, lids and conjunctivae normal ENMT: Mucous membranes are moist. Posterior pharynx clear of any exudate or lesions.Normal dentition.  Neck: normal, supple, no masses, no thyromegaly Respiratory: clear to auscultation bilaterally, no wheezing, no crackles. Normal respiratory effort. No accessory muscle use.  Cardiovascular: Regular rate and rhythm, no murmurs / rubs / gallops. No extremity edema. 2+ pedal pulses. No carotid bruits.  Abdomen: no tenderness, no masses palpated. No hepatosplenomegaly. Bowel sounds positive.  Musculoskeletal: no clubbing / cyanosis. No joint deformity upper and lower extremities. Good ROM, no contractures. Normal muscle tone.  Skin: Bilateral lower extremity lymphedema with redness, stasis dermatitis, warm right lower extremity with redness no obvious skin breakdown no rashes, lesions, ulcers. No induration Neurologic: CN 2-12 grossly intact. Sensation  intact, DTR normal. Strength 5/5 in all 4.  Psychiatric: Normal judgment and insight. Alert and oriented x 3. Normal mood.     Labs on Admission: I have personally reviewed following labs and imaging studies  CBC: Recent Labs  Lab 01/02/18 2047  WBC 11.0*  NEUTROABS 7.3  HGB 14.0  HCT 42.2  MCV 84.6  PLT 340   Basic Metabolic Panel: Recent Labs  Lab 01/02/18 2047  NA 133*  K 3.9  CL 97*  CO2 25  GLUCOSE 504*  BUN 10  CREATININE 0.70  CALCIUM 8.7*   GFR: Estimated Creatinine Clearance: 112 mL/min (by C-G formula based on SCr of 0.7 mg/dL). Liver Function Tests: No results for input(s): AST, ALT, ALKPHOS, BILITOT, PROT, ALBUMIN in the last  168 hours. No results for input(s): LIPASE, AMYLASE in the last 168 hours. No results for input(s): AMMONIA in the last 168 hours. Coagulation Profile: No results for input(s): INR, PROTIME in the last 168 hours. Cardiac Enzymes: No results for input(s): CKTOTAL, CKMB, CKMBINDEX, TROPONINI in the last 168 hours. BNP (last 3 results) No results for input(s): PROBNP in the last 8760 hours. HbA1C: No results for input(s): HGBA1C in the last 72 hours. CBG: Recent Labs  Lab 01/02/18 2211 01/02/18 2309  GLUCAP 414* 357*   Lipid Profile: No results for input(s): CHOL, HDL, LDLCALC, TRIG, CHOLHDL, LDLDIRECT in the last 72 hours. Thyroid Function Tests: No results for input(s): TSH, T4TOTAL, FREET4, T3FREE, THYROIDAB in the last 72 hours. Anemia Panel: No results for input(s): VITAMINB12, FOLATE, FERRITIN, TIBC, IRON, RETICCTPCT in the last 72 hours. Urine analysis:    Component Value Date/Time   COLORURINE YELLOW 01/02/2018 2142   APPEARANCEUR CLEAR 01/02/2018 2142   LABSPEC 1.010 01/02/2018 2142   PHURINE 5.5 01/02/2018 2142   GLUCOSEU >=500 (A) 01/02/2018 2142   HGBUR NEGATIVE 01/02/2018 2142   BILIRUBINUR NEGATIVE 01/02/2018 2142   KETONESUR NEGATIVE 01/02/2018 2142   PROTEINUR NEGATIVE 01/02/2018 2142   UROBILINOGEN  0.2 05/05/2014 1815   NITRITE NEGATIVE 01/02/2018 2142   LEUKOCYTESUR NEGATIVE 01/02/2018 2142   Sepsis Labs: @LABRCNTIP (procalcitonin:4,lacticidven:4) )No results found for this or any previous visit (from the past 240 hour(s)).   Radiological Exams on Admission: No results found.   Assessment/Plan Principal Problem:   Cellulitis and abscess of right leg Active Problems:   GERD (gastroesophageal reflux disease)   Obesity   Diabetes (HCC)   Tobacco abuse     #1 cellulitis of the right leg: Associated lymphedema.  Patient has failed outpatient treatment.  We will initiate vancomycin and Zosyn per pharmacy.  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  We will try Benadryl prior to her vancomycin with have the dose and try.  If she tolerates it we will continue for now until cultures are back.  #2 insulin-dependent diabetes: Continue 70/30 insulin and add sliding scale insulin with IV saline.  Blood sugar has already dropped into the 300s.  I suspect it to improve without insulin drip.  #3 morbid obesity: Dietary counseling to be given.  Continue close monitoring.  #4 tobacco abuse: Patient will be placed on nicotine patch.  #5 GERD: Continue with PPIs.   DVT prophylaxis: Lovenox Code Status: Full code Family Communication: No family available Disposition Plan: Home Consults called: None Admission status: Inpatient  Severity of Illness: The appropriate patient status for this patient is INPATIENT. Inpatient status is judged to be reasonable and necessary in order to provide the required intensity of service to ensure the patient's safety. The patient's presenting symptoms, physical exam findings, and initial radiographic and laboratory data in the context of their chronic comorbidities is felt to place them at high risk for further clinical deterioration. Furthermore, it is not anticipated that the  patient will be medically stable for discharge from the hospital within 2 midnights of admission. The following factors support the patient status of inpatient.   " The patient's presenting symptoms include right lower extremity pain and redness. " The worrisome physical exam findings include redness and swelling of bilateral lower extremity. " The initial radiographic and laboratory data are worrisome because of lactic acidosis with leukocytosis. " The chronic co-morbidities include diabetes and morbid obesity.   *  I certify that at the point of admission it is my clinical judgment that the patient will require inpatient hospital care spanning beyond 2 midnights from the point of admission due to high intensity of service, high risk for further deterioration and high frequency of surveillance required.Lonia Blood MD [facility] Pager [phone]  If 7PM-7AM, please contact night-coverage [email or web address] Password [facility]  01/03/2018, 12:18 AM

## 2018-01-03 NOTE — Progress Notes (Signed)
PROGRESS NOTE    [name]  WRU:[id number] DOB: 05-10-1970 DOA: 01/02/2018 PCP: Lizbeth Bark, NP   Brief Narrative:  47 year old female with past medical history of insulin-dependent diabetes, morbid obesity, cellulitis bilateral lower extremity lymphedema, recurrent cellulitis was transferred here from [facility] for evaluation of right lower extremity pain, tenderness and erythema.  Patient stated she had difficulty walking due to erythema and tenderness.  Outpatient she had failed treatment with doxycycline, Keflex and pain management.  Her blood glucose was elevated greater than 500 but no evidence of DKA.  Lactic acid was elevated at 2.8.  Her pulse was 111 with temperature of 99.  WBC of 11.  Initially she was started on vancomycin and Zosyn and transferred here for inpatient care.   Assessment & Plan:   Principal Problem:   Cellulitis and abscess of right leg Active Problems:   GERD (gastroesophageal reflux disease)   Obesity   Diabetes (HCC)   Tobacco abuse  Right lower extremity cellulitis without any evidence of abscess - This is associated with chronic lymphedema.  She has failed outpatient treatment.  She has continued to improve a little bit with IV vancomycin and Zosyn.  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  Previously treated with premedication with Benadryl and spine fusion which have ordered. - We will try and transition her from IV vancomycin and Zosyn to oral Bactrim and monitor her to see how she does.  Cultures remain negative. -Pain control.  Had right lower extremity Dopplers done on 12/25/2017 which was negative for DVT.  Insulin-dependent diabetes mellitus -Resume her home insulin.  Accu-Chek sliding scale ordered.  Morbid obesity with BMI greater than 50 -Dietary and exercise counseling has been given.  Tobacco use -Nicotine patch  GERD -PPI  DVT prophylaxis: Lovenox Code Status: Full  code Family Communication: None at bedside Disposition Plan: Maintain inpatient stay as she is failed outpatient treatment with oral antibiotics.  We will transition her to oral antibiotics Bactrim here and see how she does, if she continues to tolerate this and her symptoms do improve we will discharge her on Bactrim. Given recurrence of her symptoms/cellulitis, would like to monitor in the hospital on oral antibiotics to see if there actually working well otherwise will end up needing more IV antibiotics.  We will also need better control of her blood glucose.  Consultants:   None  Procedures:   None  Antimicrobials:   Vancomycin and Zosyn 9/17-9/18  Bactrim double strength-9/18   Subjective: States she feels a little better today and her pain is slightly improved.  We will try to walk on it today and see how she is able to tolerate any weight.  Review of Systems Otherwise negative except as per HPI, including: General: Denies fever, chills, night sweats or unintended weight loss. Resp: Denies cough, wheezing, shortness of breath. Cardiac: Denies chest pain, palpitations, orthopnea, paroxysmal nocturnal dyspnea. GI: Denies abdominal pain, nausea, vomiting, diarrhea or constipation GU: Denies dysuria, frequency, hesitancy or incontinence MS: Denies muscle aches, joint pain or swelling Neuro: Denies headache, neurologic deficits (focal weakness, numbness, tingling), abnormal gait Psych: Denies anxiety, depression, SI/HI/AVH Skin: Denies new rashes or lesions ID: Denies sick contacts, exotic exposures, travel  Objective: Vitals:   01/02/18 2158 01/03/18 0011 01/03/18 0012 01/03/18 0610  BP: (!) 142/88 132/76  139/87  Pulse: 99 96  87  Resp: 18 (!) 22  20  Temp:  98.2 F (36.8 C)  97.8  F (36.6 C)  TempSrc:  Oral    SpO2: 97% 99%  94%  Weight:   (!) 136.9 kg   Height:   5\' 1"  (1.549 m)     Intake/Output Summary (Last 24 hours) at 01/03/2018 1320 Last data filed at  01/03/2018 1210 Gross per 24 hour  Intake 3659.12 ml  Output -  Net 3659.12 ml   Filed Weights   01/02/18 2009 01/02/18 2010 01/03/18 0012  Weight: (!) 142.9 kg (!) 136.5 kg (!) 136.9 kg    Examination:  General exam: Appears calm and comfortable, morbid obesity. Respiratory system: Clear to auscultation. Respiratory effort normal. Cardiovascular system: S1 & S2 heard, RRR. No JVD, murmurs, rubs, gallops or clicks. No pedal edema. Gastrointestinal system: Abdomen is nondistended, soft and nontender. No organomegaly or masses felt. Normal bowel sounds heard. Central nervous system: Alert and oriented. No focal neurological deficits. Extremities: Symmetric 5 x 5 power. Skin: Right lower extremity erythema noted, slightly regressed from the demarcated line of her cellulitis border Psychiatry: Judgement and insight appear normal. Mood & affect appropriate.     Data Reviewed:   CBC: Recent Labs  Lab 01/02/18 2047 01/03/18 0058  WBC 11.0* 10.4  NEUTROABS 7.3  --   HGB 14.0 12.7  HCT 42.2 38.9  MCV 84.6 85.5  PLT 340 332   Basic Metabolic Panel: Recent Labs  Lab 01/02/18 2047 01/03/18 0058  NA 133* 136  K 3.9 3.7  CL 97* 103  CO2 25 26  GLUCOSE 504* 347*  BUN 10 11  CREATININE 0.70 0.57  CALCIUM 8.7* 8.7*   GFR: Estimated Creatinine Clearance: 112 mL/min (by C-G formula based on SCr of 0.57 mg/dL). Liver Function Tests: Recent Labs  Lab 01/03/18 0058  AST 18  ALT 20  ALKPHOS 87  BILITOT 0.6  PROT 6.5  ALBUMIN 3.1*   No results for input(s): LIPASE, AMYLASE in the last 168 hours. No results for input(s): AMMONIA in the last 168 hours. Coagulation Profile: No results for input(s): INR, PROTIME in the last 168 hours. Cardiac Enzymes: No results for input(s): CKTOTAL, CKMB, CKMBINDEX, TROPONINI in the last 168 hours. BNP (last 3 results) No results for input(s): PROBNP in the last 8760 hours. HbA1C: No results for input(s): HGBA1C in the last 72  hours. CBG: Recent Labs  Lab 01/02/18 2211 01/02/18 2309 01/03/18 0046 01/03/18 0743 01/03/18 1142  GLUCAP 414* 357* 322* 245* 134*   Lipid Profile: No results for input(s): CHOL, HDL, LDLCALC, TRIG, CHOLHDL, LDLDIRECT in the last 72 hours. Thyroid Function Tests: No results for input(s): TSH, T4TOTAL, FREET4, T3FREE, THYROIDAB in the last 72 hours. Anemia Panel: No results for input(s): VITAMINB12, FOLATE, FERRITIN, TIBC, IRON, RETICCTPCT in the last 72 hours. Sepsis Labs: Recent Labs  Lab 01/02/18 2052  LATICACIDVEN 2.86*    Recent Results (from the past 240 hour(s))  Culture, blood (routine x 2)     Status: None (Preliminary result)   Collection Time: 01/02/18  8:47 PM  Result Value Ref Range Status   Specimen Description   Final    BLOOD RIGHT ANTECUBITAL Performed at [facility] [place], [address] [place], [place] [address]    Special Requests   Final    BOTTLES DRAWN AEROBIC AND ANAEROBIC Blood Culture adequate volume Performed at [facility] [place], [address] [place], [place] [address]    Culture   Final    NO GROWTH < 12 HOURS Performed at [facility],  [address], [place], [place] [address]    Report Status PENDING  Incomplete  Culture, blood (routine x 2)     Status: None (Preliminary result)   Collection Time: 01/02/18  8:50 PM  Result Value Ref Range Status   Specimen Description   Final    BLOOD BLOOD RIGHT HAND Performed at [facility], [address] [place], [place] [address]    Special Requests   Final    BOTTLES DRAWN AEROBIC AND ANAEROBIC Blood Culture adequate volume Performed at [facility], [address] [place], [place] [address]    Culture   Final    NO GROWTH < 12 HOURS Performed at [facility], [address], [place], [place] [address]    Report Status PENDING  Incomplete         Radiology Studies: No results found.      Scheduled Meds: . diphenhydrAMINE   25 mg Intravenous Q12H  . enoxaparin (LOVENOX) injection  60 mg Subcutaneous QHS  . [START ON 01/04/2018] Influenza vac split quadrivalent PF  0.5 mL Intramuscular Tomorrow-1000  . insulin aspart  0-20 Units Subcutaneous TID WC  . insulin aspart  0-5 Units Subcutaneous QHS  . insulin aspart protamine- aspart  15 Units Subcutaneous BID WC  . nicotine  21 mg Transdermal Daily  . pantoprazole  20 mg Oral Daily  . sulfamethoxazole-trimethoprim  1 tablet Oral Q12H   Continuous Infusions: . sodium chloride Stopped (01/02/18 2327)  . sodium chloride Stopped (01/02/18 2327)  . sodium chloride Stopped (01/03/18 0943)  . dextrose 5 % and 0.45% NaCl Stopped (01/03/18 0943)     LOS: 1 day   Time spent= 26 mins    Brylynn Hanssen Joline Maxcy, MD [facility] Pager [phone]   If 7PM-7AM, please contact night-coverage [email or web address] Password [email or web address] 01/03/2018, 1:20 PM

## 2018-01-04 LAB — BASIC METABOLIC PANEL
Anion gap: 8 (ref 5–15)
BUN: 11 mg/dL (ref 6–20)
CO2: 26 mmol/L (ref 22–32)
Calcium: 8.6 mg/dL — ABNORMAL LOW (ref 8.9–10.3)
Chloride: 106 mmol/L (ref 98–111)
Creatinine, Ser: 0.57 mg/dL (ref 0.44–1.00)
GFR calc Af Amer: >60 mL/min (ref >60)
GFR calc non Af Amer: >60 mL/min (ref >60)
Glucose, Bld: 189 mg/dL — ABNORMAL HIGH (ref 70–99)
Potassium: 3.8 mmol/L (ref 3.5–5.1)
Sodium: 140 mmol/L (ref 135–145)

## 2018-01-04 LAB — MAGNESIUM: Magnesium: 1.9 mg/dL (ref 1.7–2.4)

## 2018-01-04 LAB — GLUCOSE, CAPILLARY
Glucose-Capillary: 178 mg/dL — ABNORMAL HIGH (ref 70–99)
Glucose-Capillary: 226 mg/dL — ABNORMAL HIGH (ref 70–99)

## 2018-01-04 MED ORDER — SULFAMETHOXAZOLE-TRIMETHOPRIM 800-160 MG PO TABS: 1.0000 | ORAL_TABLET | Freq: Two times a day (BID) | ORAL | 0 refills | Status: AC

## 2018-01-04 NOTE — Care Management Note (Signed)
Case Management Note  Patient Details  Name: Brooke BurtonJennifer Peterson MRN: [id number] Date of Birth: 11-Jan-1971  Subjective/Objective:                  Discharged to home  Action/Plan: No cm needs present at this time.  Expected Discharge Date:  01/04/18               Expected Discharge Plan:  Home/Self Care  In-House Referral:     Discharge planning Services  CM Consult  Post Acute Care Choice:    Choice offered to:     DME Arranged:    DME Agency:     HH Arranged:    HH Agency:     Status of Service:  Completed, signed off  If discussed at [employer] Length of Stay Meetings, dates discussed:    Additional Comments:  Golda AcreDavis, Macyn Remmert Lynn, RN 01/04/2018, 11:17 AM

## 2018-01-04 NOTE — Discharge Summary (Signed)
Physician Discharge Summary  [name] ONG:[id number] DOB: [birth year] DOA: 01/02/2018  PCP: Lizbeth Bark, NP  Admit date: 01/02/2018 Discharge date: 01/04/2018  Admitted From: Home Disposition: Home  Recommendations for Outpatient Follow-up:  1. Follow up with PCP in 1-2 weeks 2. Please obtain BMP/CBC in one week your next doctors visit.  3. Take Bactrim double strength twice daily for 3 days  Home Health: None Equipment/Devices: None Discharge Condition: Stable CODE STATUS: Full code Diet recommendation: Diabetic  Brief/Interim Summary: 47 year old female with past medical history of insulin-dependent diabetes, morbid obesity, recurrent bilateral lower extremity cellulitis with lymphedema was transferred here from [facility] for evaluation of right lower extremity edema pain tenderness and swelling.  She was recently outpatient treated with doxycycline and Keflex for right lower extremity swelling but did not improve at all.  In the ER blood glucose was elevated greater than 500 without evidence of DKA and lactic acid was 2.8.  Her pulse was noted to be 111.  She was started initially on vancomycin and Zosyn and this was later switched to oral Bactrim once her symptoms had improved.  Patient does have allergy to vancomycin causing red man syndrome therefore was premedicated with Benadryl and infusion was slowed down.  Her home insulin was restarted and it improved her blood glucose. Today her erythema has regressed and appears much better on current antibiotics.  She is stable to be medically discharged with outpatient follow-up recommendations as stated above.   Discharge Diagnoses:  Principal Problem:   Cellulitis and abscess of right leg Active Problems:   GERD (gastroesophageal reflux disease)   Obesity   Diabetes (HCC)   Tobacco abuse  Right lower extremity cellulitis without any evidence of abscess, improved - This is associated with chronic lymphedema.   She has failed outpatient treatment.  She has continued to improve a little bit with IV vancomycin and Zosyn.  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  Previously treated with premedication with Benadryl and spine fusion which have ordered. - Has been switched over from IV vancomycin and Zosyn to Bactrim double strength twice daily for 3 more days at the time of discharge -Pain control.  Had right lower extremity Dopplers done on 12/25/2017 which was negative for DVT.  Insulin-dependent diabetes mellitus -Resume home diabetic medications and follow-up with primary care physician.  Advised to follow low carbohydrate diet at home.  Would benefit from outpatient follow-up with endocrinology, she can get referral for this from her primary care physician  Morbid obesity with BMI greater than 50 -Dietary and exercise counseling has been given.  Tobacco use -Nicotine patch  GERD -PPI  On Lovenox as DVT prophylaxis while she was here CODE STATUS-full  Discharge Instructions   Allergies as of 01/04/2018      Reactions   Vancomycin Other (See Comments)   Red man syndrome   Adhesive [tape] Itching, Rash      Medication List    STOP taking these medications   doxycycline 100 MG capsule Commonly known as:  VIBRAMYCIN     TAKE these medications   acetaminophen 500 MG tablet Commonly known as:  TYLENOL Take 1,000 mg by mouth every 6 (six) hours as needed for mild pain.   insulin NPH-regular Human (70-30) 100 UNIT/ML injection Commonly known as:  NOVOLIN 70/30 Inject 13 Units into the skin 2 (two) times daily with a meal. What changed:    how much to take  when to take  this   omeprazole 40 MG capsule Commonly known as:  PRILOSEC Take 40 mg by mouth daily.   pantoprazole 20 MG tablet Commonly known as:  PROTONIX Take 1 tablet (20 mg total) by mouth daily.   sulfamethoxazole-trimethoprim 800-160 MG tablet Commonly known as:  BACTRIM  DS,SEPTRA DS Take 1 tablet by mouth every 12 (twelve) hours for 3 days.      Follow-up Information    Smothers, Cathleen Corti, NP. Schedule an appointment as soon as possible for a visit in 1 week(s).   Specialty:  Nurse Practitioner Contact information: [address] [place] [address] [phone]          Allergies  Allergen Reactions  . Vancomycin Other (See Comments)    Red man syndrome  . Adhesive [Tape] Itching and Rash    You were cared for by a hospitalist during your hospital stay. If you have any questions about your discharge medications or the care you received while you were in the hospital after you are discharged, you can call the unit and asked to speak with the hospitalist on call if the hospitalist that took care of you is not available. Once you are discharged, your primary care physician will handle any further medical issues. Please note that no refills for any discharge medications will be authorized once you are discharged, as it is imperative that you return to your primary care physician (or establish a relationship with a primary care physician if you do not have one) for your aftercare needs so that they can reassess your need for medications and monitor your lab values.  Consultations:  None   Procedures/Studies: US Venous Img Lower Unilateral Right  Result Date: 12/25/2017 CLINICAL DATA:  Right calf pain and erythema for 2 days EXAM: RIGHT LOWER EXTREMITY VENOUS DUPLEX ULTRASOUND TECHNIQUE: Doppler venous assessment of the right lower extremity deep venous system was performed, including characterization of spectral flow, compressibility, and phasicity. COMPARISON:  None. FINDINGS: There is complete compressibility of the right common femoral, femoral, and popliteal veins. Doppler analysis demonstrates respiratory phasicity and augmentation of flow with calf compression. No obvious superficial vein or calf vein thrombosis. IMPRESSION: No  evidence of right lower extremity DVT Electronically Signed   By: Jolaine Click M.D.   On: 12/25/2017 21:47      Subjective: Feels much better wants to go home.  No acute events overnight..  General = no fevers, chills, dizziness, malaise, fatigue HEENT/EYES = negative for pain, redness, loss of vision, double vision, blurred vision, loss of hearing, sore throat, hoarseness, dysphagia Cardiovascular= negative for chest pain, palpitation, murmurs, lower extremity swelling Respiratory/lungs= negative for shortness of breath, cough, hemoptysis, wheezing, mucus production Gastrointestinal= negative for nausea, vomiting,, abdominal pain, melena, hematemesis Genitourinary= negative for Dysuria, Hematuria, Change in Urinary Frequency MSK = Negative for arthralgia, myalgias, Back Pain, Joint swelling  Neurology= Negative for headache, seizures, numbness, tingling  Psychiatry= Negative for anxiety, depression, suicidal and homocidal ideation Allergy/Immunology= Medication/Food allergy as listed  Skin= Negative for Rash, lesions, ulcers, itching   Discharge Exam: Vitals:   01/03/18 2135 01/04/18 0452  BP: 110/64 136/67  Pulse: 79 84  Resp: 20 16  Temp: 97.6 F (36.4 C) 97.7 F (36.5 C)  SpO2: 97% 98%   Vitals:   01/03/18 0610 01/03/18 1434 01/03/18 2135 01/04/18 0452  BP: 139/87 (!) 96/41 110/64 136/67  Pulse: 87 86 79 84  Resp: 20 16 20 16   Temp: 97.8 F (36.6 C) 98.4 F (  36.9 C) 97.6 F (36.4 C) 97.7 F (36.5 C)  TempSrc:      SpO2: 94% 98% 97% 98%  Weight:      Height:        General: Pt is alert, awake, not in acute distress Cardiovascular: RRR, S1/S2 +, no rubs, no gallops Respiratory: CTA bilaterally, no wheezing, no rhonchi Abdominal: Soft, NT, ND, bowel sounds + Extremities: no edema, no cyanosis Erythema of her right lower extremity has greatly regressed and looks much better than yesterday.  No evidence of purulent drainage    The results of significant  diagnostics from this hospitalization (including imaging, microbiology, ancillary and laboratory) are listed below for reference.     Microbiology: Recent Results (from the past 240 hour(s))  Culture, blood (routine x 2)     Status: None (Preliminary result)   Collection Time: 01/02/18  8:47 PM  Result Value Ref Range Status   Specimen Description   Final    BLOOD RIGHT ANTECUBITAL Performed at [facility] [place], [address] [place], [place] [address]    Special Requests   Final    BOTTLES DRAWN AEROBIC AND ANAEROBIC Blood Culture adequate volume Performed at [facility] [place], [address] [place], [place] [address]    Culture   Final    NO GROWTH 2 DAYS Performed at [facility], [address] [place], [place] [address]    Report Status PENDING  Incomplete  Culture, blood (routine x 2)     Status: None (Preliminary result)   Collection Time: 01/02/18  8:50 PM  Result Value Ref Range Status   Specimen Description   Final    BLOOD BLOOD RIGHT HAND Performed at [facility] [place], [address] [place], [place] [address]    Special Requests   Final    BOTTLES DRAWN AEROBIC AND ANAEROBIC Blood Culture adequate volume Performed at [facility] [place], [address] [place], [place] [address]    Culture   Final    NO GROWTH 2 DAYS Performed at [facility], [address] [place], [place] [address]    Report Status PENDING  Incomplete     Labs: BNP (last 3 results) No results for input(s): BNP in the last 8760 hours. Basic Metabolic Panel: Recent Labs  Lab 01/02/18 2047 01/03/18 0058 01/04/18 0522  NA 133* 136 140  K 3.9 3.7 3.8  CL 97* 103 106  CO2 25 26 26   GLUCOSE 504* 347* 189*  BUN 10 11 11   CREATININE 0.70 0.57 0.57  CALCIUM 8.7* 8.7* 8.6*  MG  --   --  1.9   Liver Function Tests: Recent Labs  Lab 01/03/18 0058  AST 18  ALT 20  ALKPHOS 87  BILITOT 0.6  PROT 6.5  ALBUMIN 3.1*   No results for  input(s): LIPASE, AMYLASE in the last 168 hours. No results for input(s): AMMONIA in the last 168 hours. CBC: Recent Labs  Lab 01/02/18 2047 01/03/18 0058  WBC 11.0* 10.4  NEUTROABS 7.3  --   HGB 14.0 12.7  HCT 42.2 38.9  MCV 84.6 85.5  PLT 340 332   Cardiac Enzymes: No results for input(s): CKTOTAL, CKMB, CKMBINDEX, TROPONINI in the last 168 hours. BNP: Invalid input(s): POCBNP CBG: Recent Labs  Lab 01/03/18 1142 01/03/18 1558 01/03/18 1704 01/03/18 2136 01/04/18 0754  GLUCAP 134* 185* 186* 181* 178*   D-Dimer No results for input(s): DDIMER in the  last 72 hours. Hgb A1c No results for input(s): HGBA1C in the last 72 hours. Lipid Profile No results for input(s): CHOL, HDL, LDLCALC, TRIG, CHOLHDL, LDLDIRECT in the last 72 hours. Thyroid function studies No results for input(s): TSH, T4TOTAL, T3FREE, THYROIDAB in the last 72 hours.  Invalid input(s): FREET3 Anemia work up No results for input(s): VITAMINB12, FOLATE, FERRITIN, TIBC, IRON, RETICCTPCT in the last 72 hours. Urinalysis    Component Value Date/Time   COLORURINE YELLOW 01/02/2018 2142   APPEARANCEUR CLEAR 01/02/2018 2142   LABSPEC 1.010 01/02/2018 2142   PHURINE 5.5 01/02/2018 2142   GLUCOSEU >=500 (A) 01/02/2018 2142   HGBUR NEGATIVE 01/02/2018 2142   BILIRUBINUR NEGATIVE 01/02/2018 2142   KETONESUR NEGATIVE 01/02/2018 2142   PROTEINUR NEGATIVE 01/02/2018 2142   UROBILINOGEN 0.2 05/05/2014 1815   NITRITE NEGATIVE 01/02/2018 2142   LEUKOCYTESUR NEGATIVE 01/02/2018 2142   Sepsis Labs Invalid input(s): PROCALCITONIN,  WBC,  LACTICIDVEN Microbiology Recent Results (from the past 240 hour(s))  Culture, blood (routine x 2)     Status: None (Preliminary result)   Collection Time: 01/02/18  8:47 PM  Result Value Ref Range Status   Specimen Description   Final    BLOOD RIGHT ANTECUBITAL Performed at [facility], [address] [place], [place] [address]    Special Requests   Final     BOTTLES DRAWN AEROBIC AND ANAEROBIC Blood Culture adequate volume Performed at [facility], [address] [place], [place] [address]    Culture   Final    NO GROWTH 2 DAYS Performed at [facility], [address], [place], [place] [address]    Report Status PENDING  Incomplete  Culture, blood (routine x 2)     Status: None (Preliminary result)   Collection Time: 01/02/18  8:50 PM  Result Value Ref Range Status   Specimen Description   Final    BLOOD BLOOD RIGHT HAND Performed at [facility], [address] [place], [place] [address]    Special Requests   Final    BOTTLES DRAWN AEROBIC AND ANAEROBIC Blood Culture adequate volume Performed at [facility], [address] [place], [place] [address]    Culture   Final    NO GROWTH 2 DAYS Performed at [facility], [address], [place], [place] [address]    Report Status PENDING  Incomplete     Time coordinating discharge:  I have spent 35 minutes face to face with the patient and on the ward discussing the patients care, assessment, plan and disposition with other care givers. >50% of the time was devoted counseling the patient about the risks and benefits of treatment/Discharge disposition and coordinating care.   SIGNED:   Dimple Nanas, MD  [facility] 01/04/2018, 10:55 AM Pager   If 7PM-7AM, please contact night-coverage [email or web address] Password [email or web address]

## 2018-01-07 LAB — CULTURE, BLOOD (ROUTINE X 2)
Culture: NO GROWTH
Culture: NO GROWTH
Special Requests: ADEQUATE
Special Requests: ADEQUATE

## 2018-04-30 ENCOUNTER — Other Ambulatory Visit (HOSPITAL_BASED_OUTPATIENT_CLINIC_OR_DEPARTMENT_OTHER): Payer: Self-pay | Admitting: Family

## 2018-04-30 DIAGNOSIS — Z1231 Encounter for screening mammogram for malignant neoplasm of breast: Secondary | ICD-10-CM

## 2018-06-23 ENCOUNTER — Emergency Department (HOSPITAL_BASED_OUTPATIENT_CLINIC_OR_DEPARTMENT_OTHER): Payer: BLUE CROSS/BLUE SHIELD

## 2018-06-23 ENCOUNTER — Other Ambulatory Visit: Payer: Self-pay

## 2018-06-23 ENCOUNTER — Emergency Department (HOSPITAL_BASED_OUTPATIENT_CLINIC_OR_DEPARTMENT_OTHER)
Admission: EM | Admit: 2018-06-23 | Discharge: 2018-06-24 | Disposition: A | Discharge status: Home / Self Care | Payer: BLUE CROSS/BLUE SHIELD | Attending: Emergency Medicine | Admitting: Emergency Medicine

## 2018-06-23 ENCOUNTER — Encounter (HOSPITAL_BASED_OUTPATIENT_CLINIC_OR_DEPARTMENT_OTHER): Payer: Self-pay | Admitting: *Deleted

## 2018-06-23 DIAGNOSIS — Y9389 Activity, other specified: Secondary | ICD-10-CM | POA: Insufficient documentation

## 2018-06-23 DIAGNOSIS — Z87891 Personal history of nicotine dependence: Secondary | ICD-10-CM | POA: Diagnosis not present

## 2018-06-23 DIAGNOSIS — Y929 Unspecified place or not applicable: Secondary | ICD-10-CM | POA: Insufficient documentation

## 2018-06-23 DIAGNOSIS — Z79899 Other long term (current) drug therapy: Secondary | ICD-10-CM | POA: Diagnosis not present

## 2018-06-23 DIAGNOSIS — Z794 Long term (current) use of insulin: Secondary | ICD-10-CM | POA: Diagnosis not present

## 2018-06-23 DIAGNOSIS — S59912A Unspecified injury of left forearm, initial encounter: Secondary | ICD-10-CM | POA: Diagnosis present

## 2018-06-23 DIAGNOSIS — X500XXA Overexertion from strenuous movement or load, initial encounter: Secondary | ICD-10-CM | POA: Diagnosis not present

## 2018-06-23 DIAGNOSIS — E119 Type 2 diabetes mellitus without complications: Secondary | ICD-10-CM | POA: Diagnosis not present

## 2018-06-23 DIAGNOSIS — Y999 Unspecified external cause status: Secondary | ICD-10-CM | POA: Diagnosis not present

## 2018-06-23 DIAGNOSIS — S56912A Strain of unspecified muscles, fascia and tendons at forearm level, left arm, initial encounter: Secondary | ICD-10-CM | POA: Insufficient documentation

## 2018-06-23 MED ORDER — IBUPROFEN 400 MG PO TABS
400.0000 mg | ORAL_TABLET | Freq: Once | ORAL | Status: AC
  Administered 2018-06-24: 400 mg via ORAL
  Filled 2018-06-23: qty 1

## 2018-06-23 NOTE — ED Triage Notes (Signed)
Pt reports pain in left forearm since Wednesday. States she has been lifting boxes and may have strained it

## 2018-06-24 ENCOUNTER — Other Ambulatory Visit: Payer: Self-pay

## 2018-06-24 MED ORDER — IBUPROFEN 400 MG PO TABS: 400.0000 mg | ORAL_TABLET | Freq: Three times a day (TID) | ORAL | 0 refills | Status: DC | PRN

## 2018-06-24 NOTE — ED Provider Notes (Signed)
[facility] EMERGENCY DEPARTMENT Provider Note   CSN: [id number] Arrival date & time: 06/23/18  2220    History   Chief Complaint Chief Complaint  Patient presents with  . Arm Pain    HPI [name] is a 48 y.o. female.     Obese diabetic female presenting with left forearm pain for the past 3 days.  States it started as if she was lifting a heavy box.  Is been constant pain that waxes and wanes in severity but never goes away completely.  The pain does not radiate past her elbow.  No head, neck, shoulder, chest pain.  There has been no weakness in the arm.  There is intermittent tingling in her fingertips but none currently.  She believes she could have possibly strained the arm while she was lifting a box.  She has a history of diabetes and acid reflux disease.  No cardiac history. She reports pain is in the dorsal aspect of her left forearm and does not radiate past her elbow.  The history is provided by the patient.  Arm Pain  Pertinent negatives include no abdominal pain, no headaches and no shortness of breath.    Past Medical History:  Diagnosis Date  . Cellulitis 07/2011  . Diabetes mellitus    dx 2016  . GERD (gastroesophageal reflux disease)     Patient Active Problem List   Diagnosis Date Noted  . Cellulitis and abscess of right leg 01/02/2018  . Bilateral carpal tunnel syndrome 07/06/2016  . Left elbow pain 02/17/2016  . Tobacco abuse 10/29/2015  . Injury of right hand 05/19/2015  . Cervical disc disorder with radiculopathy of cervical region 10/10/2013  . Diabetes (HCC) 04/07/2013  . Pyelonephritis 04/06/2013  . RLQ abdominal pain 04/06/2013  . Fever 09/07/2012  . Headache(784.0) 09/07/2012  . Cellulitis 08/05/2011  . Peripheral edema 08/05/2011  . GERD (gastroesophageal reflux disease) 08/05/2011  . Obesity 08/05/2011    Past Surgical History:  Procedure Laterality Date  . ABLATION    . CARPAL TUNNEL RELEASE Left 09/13/2016   Procedure: LEFT CARPAL TUNNEL RELEASE;  Surgeon: Cindee Salt, MD;  Location: [facility] OR;  Service: Orthopedics;  Laterality: Left;  . CHOLECYSTECTOMY    . KNEE ARTHROSCOPY    . LAPAROSCOPIC TUBAL LIGATION  04/29/2011   Procedure: LAPAROSCOPIC TUBAL LIGATION;  Surgeon: Turner Daniels, MD;  Location: [facility]S;  Service: Gynecology;  Laterality: Bilateral;  with Filshie Clips  . ROTATOR CUFF REPAIR    . TUBAL LIGATION       OB History   No obstetric history on file.      Home Medications    Prior to Admission medications   Medication Sig Start Date End Date Taking? Authorizing Provider  acetaminophen (TYLENOL) 500 MG tablet Take 1,000 mg by mouth every 6 (six) hours as needed for mild pain.    [provider]  insulin NPH-regular (NOVOLIN 70/30) (70-30) 100 UNIT/ML injection Inject 13 Units into the skin 2 (two) times daily with a meal. Patient taking differently: Inject 10 Units into the skin daily with supper.  04/08/13   Dhungel, Theda Belfast, MD  omeprazole (PRILOSEC) 40 MG capsule Take 40 mg by mouth daily.    [provider]  pantoprazole (PROTONIX) 20 MG tablet Take 1 tablet (20 mg total) by mouth daily. Patient not taking: Reported on 01/03/2018 05/26/17   Rolan Bucco, MD    Family History Family History  Problem Relation Age of Onset  . Cancer Mother   .  Diabetes Mother   . Hypertension Mother     Social History Social History   Tobacco Use  . Smoking status: Former Smoker    Packs/day: 0.50    Years: 2.00    Pack years: 1.00    Types: Cigarettes    Last attempt to quit: 07/22/2011    Years since quitting: 6.9  . Smokeless tobacco: Never Used  Substance Use Topics  . Alcohol use: Yes    Alcohol/week: 0.0 standard drinks    Comment: occ  . Drug use: No     Allergies   Vancomycin and Adhesive [tape]   Review of Systems Review of Systems  Constitutional: Negative for activity change, appetite change, fatigue and fever.  HENT: Negative for congestion  and rhinorrhea.   Eyes: Negative for visual disturbance.  Respiratory: Negative for cough, chest tightness and shortness of breath.   Gastrointestinal: Negative for abdominal pain and vomiting.  Musculoskeletal: Positive for arthralgias and myalgias.  Skin: Negative for rash.  Neurological: Negative for dizziness, weakness and headaches.   all other systems are negative except as noted in the HPI and PMH.     Physical Exam Updated Vital Signs BP 134/68 (BP Location: Right Arm)   Pulse 100   Temp 97.9 F (36.6 C) (Oral)   Resp 16   Ht 5\' 1"  (1.549 m)   Wt 136.1 kg   SpO2 100%   BMI 56.68 kg/m   Physical Exam Vitals signs and nursing note reviewed.  Constitutional:      General: She is not in acute distress.    Appearance: She is well-developed. She is obese.  HENT:     Head: Normocephalic and atraumatic.     Mouth/Throat:     Pharynx: No oropharyngeal exudate.  Eyes:     Conjunctiva/sclera: Conjunctivae normal.     Pupils: Pupils are equal, round, and reactive to light.  Neck:     Musculoskeletal: Normal range of motion and neck supple.     Comments: No meningismus. No paraspinal C-spine tenderness Cardiovascular:     Rate and Rhythm: Normal rate and regular rhythm.     Heart sounds: Normal heart sounds. No murmur.  Pulmonary:     Effort: Pulmonary effort is normal. No respiratory distress.     Breath sounds: Normal breath sounds.  Abdominal:     Palpations: Abdomen is soft.     Tenderness: There is no abdominal tenderness. There is no guarding or rebound.  Musculoskeletal: Normal range of motion.        General: Tenderness present.     Comments: Tenderness to left dorsal forearm without overlying skin change.  Intact radial pulse, intact cardinal hand movements.  Full strength of flexion extension of the elbow, wrist and fingers.  Skin:    General: Skin is warm.     Capillary Refill: Capillary refill takes less than 2 seconds.  Neurological:     General: No  focal deficit present.     Mental Status: She is alert and oriented to person, place, and time.     Cranial Nerves: No cranial nerve deficit.     Motor: No abnormal muscle tone.     Coordination: Coordination normal.     Comments:  5/5 strength throughout. CN 2-12 intact.Equal grip strength.   Psychiatric:        Behavior: Behavior normal.      ED Treatments / Results  Labs (all labs ordered are listed, but only abnormal results are displayed) Labs Reviewed -  No data to display  EKG EKG Interpretation  Date/Time:  Sunday June 24 2018 00:36:19 EST Ventricular Rate:  93 PR Interval:    QRS Duration: 79 QT Interval:  356 QTC Calculation: 443 R Axis:   13 Text Interpretation:  Sinus rhythm Low voltage, precordial leads Baseline wander in lead(s) V2 No significant change was found Confirmed by Glynn Octave [phone]) on 06/24/2018 12:58:38 AM   Radiology Dg Forearm Left  Result Date: 06/23/2018 CLINICAL DATA:  Forearm pain after lifting boxes. EXAM: LEFT FOREARM - 2 VIEW COMPARISON:  None. FINDINGS: There is no evidence of fracture or other focal bone lesions. Soft tissues are unremarkable. IMPRESSION: Negative. Electronically Signed   By: Tollie Eth M.D.   On: 06/23/2018 23:18    Procedures Procedures (including critical care time)  Medications Ordered in ED Medications  ibuprofen (ADVIL,MOTRIN) tablet 400 mg (has no administration in time range)     Initial Impression / Assessment and Plan / ED Course  I have reviewed the triage vital signs and the nursing notes.  Pertinent labs & imaging results that were available during my care of the patient were reviewed by me and considered in my medical decision making (see chart for details).       Left forearm pain for the past several days after lifting a heavy box.  Neurovascularly intact.  Screening x-ray in triage is negative. No chest pain, upper arm pain or neck pain.  Low suspicion for ACS equivalent. EKG  unchanged. Ongoing pain for past 3 days that is reproducible.  Will splint for comfort. Ice, elevation, NSAIDs.  Followup with PCP or sports medicine.  Return precautions discussed.   Final Clinical Impressions(s) / ED Diagnoses   Final diagnoses:  Strain of left forearm, initial encounter    ED Discharge Orders    None       Aison Malveaux, Jeannett Senior, MD 06/24/18 [phone]

## 2018-06-24 NOTE — Discharge Instructions (Signed)
Take the anti-inflammatories as prescribed and wear the splint for comfort.  You should follow-up with your PCP or the sports medicine doctor in this building.  Return to the ED if develop weakness, numbness, chest pain, shortness of breath or other concerns.

## 2018-07-04 ENCOUNTER — Ambulatory Visit (INDEPENDENT_AMBULATORY_CARE_PROVIDER_SITE_OTHER): Payer: BLUE CROSS/BLUE SHIELD | Admitting: Family Medicine

## 2018-07-04 ENCOUNTER — Other Ambulatory Visit: Payer: Self-pay

## 2018-07-04 ENCOUNTER — Encounter: Payer: Self-pay | Admitting: Family Medicine

## 2018-07-04 VITALS — BP 100/53 | HR 103 | Temp 97.8°F | Ht 61.0 in | Wt 283.8 lb

## 2018-07-04 DIAGNOSIS — S4992XA Unspecified injury of left shoulder and upper arm, initial encounter: Secondary | ICD-10-CM

## 2018-07-04 MED ORDER — DICLOFENAC SODIUM 75 MG PO TBEC: 75.0000 mg | DELAYED_RELEASE_TABLET | Freq: Two times a day (BID) | ORAL | 1 refills | Status: DC

## 2018-07-04 NOTE — Progress Notes (Signed)
PCP: Smothers, Cathleen Corti, NP  Subjective:   HPI: Patient is a 48 y.o. female here for left forearm injury.  Patient reports 2 weeks ago she picked up a clothesbasket full of items that was heavy. Felt a pop, sharp pain lateral left forearm/elbow. Associated swelling but no bruising. Has been icing, wearing wrist brace taking ibuprofen 400mg  as needed with only mild benefit. Pain level 9/10 and sharp at worst. No numbness. No skin changes. Left handed.  Past Medical History:  Diagnosis Date  . Cellulitis 07/2011  . Diabetes mellitus    dx 2016  . GERD (gastroesophageal reflux disease)     Current Outpatient Medications on File Prior to Visit  Medication Sig Dispense Refill  . acetaminophen (TYLENOL) 500 MG tablet Take 1,000 mg by mouth every 6 (six) hours as needed for mild pain.    Marland Kitchen ibuprofen (ADVIL,MOTRIN) 400 MG tablet Take 1 tablet (400 mg total) by mouth every 8 (eight) hours as needed for moderate pain. 30 tablet 0  . insulin NPH-regular (NOVOLIN 70/30) (70-30) 100 UNIT/ML injection Inject 13 Units into the skin 2 (two) times daily with a meal. (Patient taking differently: Inject 10 Units into the skin daily with supper. ) 10 mL 12  . omeprazole (PRILOSEC) 40 MG capsule Take 40 mg by mouth daily.    . pantoprazole (PROTONIX) 20 MG tablet Take 1 tablet (20 mg total) by mouth daily. (Patient not taking: Reported on 01/03/2018) 30 tablet 0   No current facility-administered medications on file prior to visit.     Past Surgical History:  Procedure Laterality Date  . ABLATION    . CARPAL TUNNEL RELEASE Left 09/13/2016   Procedure: LEFT CARPAL TUNNEL RELEASE;  Surgeon: Cindee Salt, MD;  Location: [facility] OR;  Service: Orthopedics;  Laterality: Left;  . CHOLECYSTECTOMY    . KNEE ARTHROSCOPY    . LAPAROSCOPIC TUBAL LIGATION  04/29/2011   Procedure: LAPAROSCOPIC TUBAL LIGATION;  Surgeon: Turner Daniels, MD;  Location: [facility];  Service: Gynecology;  Laterality: Bilateral;  with Filshie  Clips  . ROTATOR CUFF REPAIR    . TUBAL LIGATION      Allergies  Allergen Reactions  . Vancomycin Other (See Comments)    Red man syndrome  . Adhesive [Tape] Itching and Rash    Social History   Socioeconomic History  . Marital status: Married    Spouse name: Not on file  . Number of children: Not on file  . Years of education: Not on file  . Highest education level: Not on file  Occupational History  . Not on file  Social Needs  . Financial resource strain: Not on file  . Food insecurity:    Worry: Not on file    Inability: Not on file  . Transportation needs:    Medical: Not on file    Non-medical: Not on file  Tobacco Use  . Smoking status: Former Smoker    Packs/day: 0.50    Years: 2.00    Pack years: 1.00    Types: Cigarettes    Last attempt to quit: 07/22/2011    Years since quitting: 6.9  . Smokeless tobacco: Never Used  Substance and Sexual Activity  . Alcohol use: Yes    Alcohol/week: 0.0 standard drinks    Comment: occ  . Drug use: No  . Sexual activity: Not on file  Lifestyle  . Physical activity:    Days per week: Not on file    Minutes per session: Not  on file  . Stress: Not on file  Relationships  . Social connections:    Talks on phone: Not on file    Gets together: Not on file    Attends religious service: Not on file    Active member of club or organization: Not on file    Attends meetings of clubs or organizations: Not on file    Relationship status: Not on file  . Intimate partner violence:    Fear of current or ex partner: Not on file    Emotionally abused: Not on file    Physically abused: Not on file    Forced sexual activity: Not on file  Other Topics Concern  . Not on file  Social History Narrative  . Not on file    Family History  Problem Relation Age of Onset  . Cancer Mother   . Diabetes Mother   . Hypertension Mother     BP (!) 100/53   Pulse (!) 103   Ht 5\' 1"  (1.549 m)   Wt 283 lb 12.8 oz (128.7 kg)   BMI  53.62 kg/m   Review of Systems: See HPI above.     Objective:  Physical Exam:  Gen: NAD, comfortable in exam room  Left elbow/forearm: No deformity, bruising.  Minimal swelling into forearm and hand. FROM with 5/5 strength elbow.  Pain on resisted wrist extension and 3rd digit extension. TTP lateral epicondyle and into extensors of forearm. NVI distally.  Right elbow/forearm: No deformity. FROM with 5/5 strength. No tenderness to palpation. NVI distally.   MSK u/s left elbow:  Common extensor tendon is intact, RCL intact.  No visible tears within extensor muscles.  No cortical irregularities of ulna or radius.  Assessment & Plan:  1. Left arm injury - 2/2 strain of forearm extensors and common extensor tendon.  Icing, diclofenac twice a day.  Wrist brace, elbow sleeve.  Shown home exercises and stretches to do daily.  F/u in 1 month.  Consider physical therapy if not improving.

## 2018-07-04 NOTE — Patient Instructions (Signed)
You strained the extensors of your forearm including the tendon of these that inserts at the elbow. Try to avoid painful activities as much as possible. Wear the wrist brace as often as possible. Ice the area 3-4 times a day for 15 minutes at a time. Diclofenac twice a day with food for pain and inflammation - don't take ibuprofen, aleve while on this. Elbow sleeve may be helpful - wear this regularly if it provides you with relief. Hammer rotation exercise, wrist extension exercise with 1 pound weight - 3 sets of 10 once a day.   Stretching - hold for 20-30 seconds and repeat 3 times. Consider physical therapy, if not improving. Follow up in 1 month but call me sooner if you're struggling and we will order physical therapy.

## 2018-07-07 ENCOUNTER — Inpatient Hospital Stay (HOSPITAL_BASED_OUTPATIENT_CLINIC_OR_DEPARTMENT_OTHER): Admission: RE | Admit: 2018-07-07 | Payer: BLUE CROSS/BLUE SHIELD | Source: Ambulatory Visit

## 2018-08-06 ENCOUNTER — Other Ambulatory Visit: Payer: Self-pay

## 2018-08-06 ENCOUNTER — Ambulatory Visit: Payer: BLUE CROSS/BLUE SHIELD | Admitting: Family Medicine

## 2018-08-06 ENCOUNTER — Encounter: Payer: Self-pay | Admitting: Family Medicine

## 2018-08-06 VITALS — BP 138/88 | HR 93 | Temp 98.1°F | Ht 61.0 in | Wt 267.0 lb

## 2018-08-06 DIAGNOSIS — M79602 Pain in left arm: Secondary | ICD-10-CM | POA: Diagnosis not present

## 2018-08-06 MED ORDER — NITROGLYCERIN 0.2 MG/HR TD PT24: MEDICATED_PATCH | TRANSDERMAL | 1 refills | Status: DC

## 2018-08-06 NOTE — Patient Instructions (Signed)
You strained the extensors of your forearm including the tendon of these that inserts at the elbow. You're now also getting impingement of your shoulder. Try to avoid painful activities as much as possible. Start physical therapy and do home exercises on days you don't go to therapy. Start nitro patches 1/4th patch to affected area, change daily. Wear the wrist brace as often as possible. Ice the area 3-4 times a day for 15 minutes at a time. Diclofenac twice a day with food for pain and inflammation as needed. Elbow sleeve may be helpful - wear this regularly if it provides you with relief. Follow up with me in 5-6 weeks.

## 2018-08-06 NOTE — Progress Notes (Signed)
PCP: Smothers, Cathleen Corti, NP  Subjective:   HPI: Patient is a 48 y.o. female here for left forearm injury.  3/18: Patient reports 2 weeks ago she picked up a clothesbasket full of items that was heavy. Felt a pop, sharp pain lateral left forearm/elbow. Associated swelling but no bruising. Has been icing, wearing wrist brace taking ibuprofen 400mg  as needed with only mild benefit. Pain level 9/10 and sharp at worst. No numbness. No skin changes. Left handed.  4/20:  Patient presents today with continued left arm pain.  7 out of 10 pain.  She continues to primarily have pain over the lateral elbow and extensor forearm.  She also now has developed some pain in the left shoulder.  She has been wearing the brace and elbow sleeve which is somewhat helpful while she is wearing them but has not provided lasting benefit.  She also takes Tylenol which is minimally helpful.  She continues to work at [employer] which involves repetitive reaching with outstretched hands.  She feels like this exacerbates her pain.  She denies any new injuries.  No bruising.  No numbness or tingling.  Past Medical History:  Diagnosis Date  . Cellulitis 07/2011  . Diabetes mellitus    dx 2016  . GERD (gastroesophageal reflux disease)     Current Outpatient Medications on File Prior to Visit  Medication Sig Dispense Refill  . acetaminophen (TYLENOL) 500 MG tablet Take 1,000 mg by mouth every 6 (six) hours as needed for mild pain.    Marland Kitchen diclofenac (VOLTAREN) 75 MG EC tablet Take 1 tablet (75 mg total) by mouth 2 (two) times daily. 60 tablet 1  . ibuprofen (ADVIL,MOTRIN) 400 MG tablet Take 1 tablet (400 mg total) by mouth every 8 (eight) hours as needed for moderate pain. 30 tablet 0  . insulin NPH-regular (NOVOLIN 70/30) (70-30) 100 UNIT/ML injection Inject 13 Units into the skin 2 (two) times daily with a meal. (Patient taking differently: Inject 10 Units into the skin daily with supper. ) 10  mL 12  . omeprazole (PRILOSEC) 40 MG capsule Take 40 mg by mouth daily.    . pantoprazole (PROTONIX) 20 MG tablet Take 1 tablet (20 mg total) by mouth daily. (Patient not taking: Reported on 01/03/2018) 30 tablet 0   No current facility-administered medications on file prior to visit.     Past Surgical History:  Procedure Laterality Date  . ABLATION    . CARPAL TUNNEL RELEASE Left 09/13/2016   Procedure: LEFT CARPAL TUNNEL RELEASE;  Surgeon: Cindee Salt, MD;  Location: [facility] OR;  Service: Orthopedics;  Laterality: Left;  . CHOLECYSTECTOMY    . KNEE ARTHROSCOPY    . LAPAROSCOPIC TUBAL LIGATION  04/29/2011   Procedure: LAPAROSCOPIC TUBAL LIGATION;  Surgeon: Turner Daniels, MD;  Location: [facility];  Service: Gynecology;  Laterality: Bilateral;  with Filshie Clips  . ROTATOR CUFF REPAIR    . TUBAL LIGATION      Allergies  Allergen Reactions  . Vancomycin Other (See Comments)    Red man syndrome  . Adhesive [Tape] Itching and Rash    Social History   Socioeconomic History  . Marital status: Married    Spouse name: Not on file  . Number of children: Not on file  . Years of education: Not on file  . Highest education level: Not on file  Occupational History  . Not on file  Social Needs  . Financial resource strain: Not on file  . Food  insecurity:    Worry: Not on file    Inability: Not on file  . Transportation needs:    Medical: Not on file    Non-medical: Not on file  Tobacco Use  . Smoking status: Former Smoker    Packs/day: 0.50    Years: 2.00    Pack years: 1.00    Types: Cigarettes    Last attempt to quit: 07/22/2011    Years since quitting: 7.0  . Smokeless tobacco: Never Used  Substance and Sexual Activity  . Alcohol use: Yes    Alcohol/week: 0.0 standard drinks    Comment: occ  . Drug use: No  . Sexual activity: Not on file  Lifestyle  . Physical activity:    Days per week: Not on file    Minutes per session: Not on file  . Stress: Not on file  Relationships  .  Social connections:    Talks on phone: Not on file    Gets together: Not on file    Attends religious service: Not on file    Active member of club or organization: Not on file    Attends meetings of clubs or organizations: Not on file    Relationship status: Not on file  . Intimate partner violence:    Fear of current or ex partner: Not on file    Emotionally abused: Not on file    Physically abused: Not on file    Forced sexual activity: Not on file  Other Topics Concern  . Not on file  Social History Narrative  . Not on file    Family History  Problem Relation Age of Onset  . Cancer Mother   . Diabetes Mother   . Hypertension Mother     BP 138/88   Pulse 93   Temp 98.1 F (36.7 C) (Oral)   Ht 5\' 1"  (1.549 m)   Wt 267 lb (121.1 kg)   BMI 50.45 kg/m   Review of Systems: See HPI above.     Objective:  Physical Exam:  GEN: Awake, alert, nuclear stress Pulmonary: Breathing unlabored  Left elbow: - Inspection: no obvious deformity. No swelling, erythema or bruising - Palpation: Tenderness over the lateral epicondyle as well as the wrist extensor muscles. - ROM: full active ROM in flexion and extension. No crepitus - Strength: 5/5 strength in wrist flexion and extension.  Pain reproduced with resisted extension of wrist. 5/5 strength in biceps, triceps. - Neuro: NV intact distally - Special testing: no laxity with varus/valgus stress.  Pain reproduced with resisted ECRB and supination.   Left Shoulder: No obvious deformity or asymmetry. No bruising. No swelling Mild, somewhat diffuse tenderness over the anterior lateral shoulder Patient has slightly limited flexion and abduction compared to the right.  Bilaterally she has fairly limited external rotation.  Normal internal rotation NV intact distally Special Tests:  - Impingement: Mild pain with Hawkins  - Supraspinatus: Negative empty can.  5/5 strength - Infraspinatus/Teres: 5/5 strength with ER -  Subscapularis: . 5/5 strength with IR - Biceps tendon: Negative Speeds.   Right shoulder: No obvious deformity No tenderness Full range of motion with the exception of external Joslyn Devonatian which is fairly limited but without pain 5/5 strength with rotator cuff testing Intact distally  Assessment & Plan:  1.  Left elbow pain secondary to mild left epicondylitis/extensor muscle strain.  I believe she also is developing some compensatory strain and impingement of the shoulder. - Referral to physical therapy - Continue home  exercise - Nitroglycerin patch for lateral epicondylitis - Continue wrist brace and elbow sleeve when able - Ice - Diclofenac twice daily - Follow-up 6 weeks

## 2018-08-07 ENCOUNTER — Encounter: Payer: Self-pay | Admitting: Family Medicine

## 2018-09-04 ENCOUNTER — Ambulatory Visit (HOSPITAL_BASED_OUTPATIENT_CLINIC_OR_DEPARTMENT_OTHER): Payer: BLUE CROSS/BLUE SHIELD

## 2018-09-12 ENCOUNTER — Ambulatory Visit: Payer: BLUE CROSS/BLUE SHIELD | Admitting: Family Medicine

## 2018-09-14 ENCOUNTER — Ambulatory Visit (HOSPITAL_BASED_OUTPATIENT_CLINIC_OR_DEPARTMENT_OTHER)
Admission: RE | Admit: 2018-09-14 | Discharge: 2018-09-14 | Disposition: A | Discharge status: Home / Self Care | Payer: BLUE CROSS/BLUE SHIELD | Source: Ambulatory Visit | Attending: Family | Admitting: Family

## 2018-09-14 ENCOUNTER — Other Ambulatory Visit: Payer: Self-pay

## 2018-09-14 DIAGNOSIS — Z1231 Encounter for screening mammogram for malignant neoplasm of breast: Secondary | ICD-10-CM | POA: Insufficient documentation

## 2018-10-31 ENCOUNTER — Other Ambulatory Visit: Payer: Self-pay

## 2018-10-31 ENCOUNTER — Encounter (HOSPITAL_BASED_OUTPATIENT_CLINIC_OR_DEPARTMENT_OTHER): Payer: Self-pay

## 2018-10-31 ENCOUNTER — Emergency Department (HOSPITAL_BASED_OUTPATIENT_CLINIC_OR_DEPARTMENT_OTHER): Payer: BLUE CROSS/BLUE SHIELD

## 2018-10-31 ENCOUNTER — Emergency Department (HOSPITAL_BASED_OUTPATIENT_CLINIC_OR_DEPARTMENT_OTHER)
Admission: EM | Admit: 2018-10-31 | Discharge: 2018-10-31 | Disposition: A | Discharge status: Home / Self Care | Payer: BLUE CROSS/BLUE SHIELD | Attending: Emergency Medicine | Admitting: Emergency Medicine

## 2018-10-31 DIAGNOSIS — E119 Type 2 diabetes mellitus without complications: Secondary | ICD-10-CM | POA: Insufficient documentation

## 2018-10-31 DIAGNOSIS — Z79899 Other long term (current) drug therapy: Secondary | ICD-10-CM | POA: Insufficient documentation

## 2018-10-31 DIAGNOSIS — Z87891 Personal history of nicotine dependence: Secondary | ICD-10-CM | POA: Diagnosis not present

## 2018-10-31 DIAGNOSIS — R1031 Right lower quadrant pain: Secondary | ICD-10-CM | POA: Diagnosis not present

## 2018-10-31 DIAGNOSIS — E669 Obesity, unspecified: Secondary | ICD-10-CM | POA: Insufficient documentation

## 2018-10-31 DIAGNOSIS — Z3202 Encounter for pregnancy test, result negative: Secondary | ICD-10-CM | POA: Insufficient documentation

## 2018-10-31 DIAGNOSIS — Z6841 Body Mass Index (BMI) 40.0 and over, adult: Secondary | ICD-10-CM | POA: Insufficient documentation

## 2018-10-31 LAB — COMPREHENSIVE METABOLIC PANEL
ALT: 21 U/L (ref 0–44)
AST: 18 U/L (ref 15–41)
Albumin: 3.2 g/dL — ABNORMAL LOW (ref 3.5–5.0)
Alkaline Phosphatase: 125 U/L (ref 38–126)
Anion gap: 11 (ref 5–15)
BUN: 12 mg/dL (ref 6–20)
CO2: 23 mmol/L (ref 22–32)
Calcium: 8.6 mg/dL — ABNORMAL LOW (ref 8.9–10.3)
Chloride: 102 mmol/L (ref 98–111)
Creatinine, Ser: 0.71 mg/dL (ref 0.44–1.00)
GFR calc Af Amer: >60 mL/min (ref >60)
GFR calc non Af Amer: >60 mL/min (ref >60)
Glucose, Bld: 317 mg/dL — ABNORMAL HIGH (ref 70–99)
Potassium: 3.8 mmol/L (ref 3.5–5.1)
Sodium: 136 mmol/L (ref 135–145)
Total Bilirubin: 0.5 mg/dL (ref 0.3–1.2)
Total Protein: 6.8 g/dL (ref 6.5–8.1)

## 2018-10-31 LAB — URINALYSIS, ROUTINE W REFLEX MICROSCOPIC
Bilirubin Urine: NEGATIVE
Glucose, UA: >=500 mg/dL — AB
Hgb urine dipstick: NEGATIVE
Ketones, ur: NEGATIVE mg/dL
Leukocytes,Ua: NEGATIVE
Nitrite: NEGATIVE
Protein, ur: NEGATIVE mg/dL
Specific Gravity, Urine: >1.03 — ABNORMAL HIGH (ref 1.005–1.030)
pH: 5.5 (ref 5.0–8.0)

## 2018-10-31 LAB — CBC
HCT: 40.3 % (ref 36.0–46.0)
Hemoglobin: 13 g/dL (ref 12.0–15.0)
MCH: 27.7 pg (ref 26.0–34.0)
MCHC: 32.3 g/dL (ref 30.0–36.0)
MCV: 85.7 fL (ref 80.0–100.0)
Platelets: 371 10*3/uL (ref 150–400)
RBC: 4.7 MIL/uL (ref 3.87–5.11)
RDW: 13.5 % (ref 11.5–15.5)
WBC: 12.1 10*3/uL — ABNORMAL HIGH (ref 4.0–10.5)
nRBC: 0 % (ref 0.0–0.2)

## 2018-10-31 LAB — URINALYSIS, MICROSCOPIC (REFLEX)

## 2018-10-31 LAB — PREGNANCY, URINE: Preg Test, Ur: NEGATIVE

## 2018-10-31 LAB — LIPASE, BLOOD: Lipase: 38 U/L (ref 11–51)

## 2018-10-31 MED ORDER — IOHEXOL 300 MG/ML  SOLN
100.0000 mL | Freq: Once | INTRAMUSCULAR | Status: AC | PRN
  Administered 2018-10-31: 100 mL via INTRAVENOUS

## 2018-10-31 MED ORDER — SODIUM CHLORIDE 0.9 % IV BOLUS
1000.0000 mL | Freq: Once | INTRAVENOUS | Status: AC
  Administered 2018-10-31: 1000 mL via INTRAVENOUS

## 2018-10-31 MED ORDER — ONDANSETRON HCL 4 MG/2ML IJ SOLN
4.0000 mg | Freq: Once | INTRAMUSCULAR | Status: AC
  Administered 2018-10-31: 4 mg via INTRAVENOUS
  Filled 2018-10-31: qty 2

## 2018-10-31 MED ORDER — MORPHINE SULFATE (PF) 4 MG/ML IV SOLN
4.0000 mg | Freq: Once | INTRAVENOUS | Status: AC
  Administered 2018-10-31: 4 mg via INTRAVENOUS
  Filled 2018-10-31: qty 1

## 2018-10-31 NOTE — ED Notes (Signed)
Patient transported to CT 

## 2018-10-31 NOTE — ED Provider Notes (Signed)
[facility] EMERGENCY DEPARTMENT Provider Note   CSN: [id number] Arrival date & time: 10/31/18  1724    History   Chief Complaint Chief Complaint  Patient presents with   Abdominal Pain    HPI [name] is a 48 y.o. female.     48 year old female presents with complaint of abdominal pain.  Patient reports pain to be sharp stabbing pain from her suprapubic to right lower quadrant areas, onset last night and continuing into today.  Pain is worse with eating, movement, palpation, nothing improves pain.  Pain became worse after eating [employer] today, associated with nausea, vomiting, diarrhea.  Denies blood in her stool or emesis.  No known sick contacts, denies fevers, chills, changes in bladder habits.  Prior abdominal surgeries include cholecystectomy and ablation.  No other complaints or concerns.     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/10/2013   Diabetes (HCC) 04/07/2013   Pyelonephritis 04/06/2013   RLQ abdominal pain 04/06/2013   Fever 09/07/2012   Headache(784.0) 09/07/2012   Cellulitis 08/05/2011   Peripheral edema 08/05/2011   GERD (gastroesophageal reflux disease) 08/05/2011   Obesity 08/05/2011    Past Surgical History:  Procedure Laterality Date   ABLATION     CARPAL TUNNEL RELEASE Left 09/13/2016   Procedure: LEFT CARPAL TUNNEL RELEASE;  Surgeon: Cindee SaltKuzma, Gary, MD;  Location: [facility] OR;  Service: Orthopedics;  Laterality: Left;   CHOLECYSTECTOMY     KNEE ARTHROSCOPY     LAPAROSCOPIC TUBAL LIGATION  04/29/2011   Procedure:  LAPAROSCOPIC TUBAL LIGATION;  Surgeon: Turner Danielsavid C Lowe, MD;  Location: [facility] ORS;  Service: Gynecology;  Laterality: Bilateral;  with Filshie Clips   ROTATOR CUFF REPAIR     TUBAL LIGATION       OB History   No obstetric history on file.      Home Medications    Prior to Admission medications   Medication Sig Start Date End Date Taking? Authorizing Provider  acetaminophen (TYLENOL) 500 MG tablet Take 1,000 mg by mouth every 6 (six) hours as needed for mild pain.    [provider]  diclofenac (VOLTAREN) 75 MG EC tablet Take 1 tablet (75 mg total) by mouth 2 (two) times daily. 07/04/18   Hudnall, Azucena FallenShane R, MD  ibuprofen (ADVIL,MOTRIN) 400 MG tablet Take 1 tablet (400 mg total) by mouth every 8 (eight) hours as needed for moderate pain. 06/24/18   Rancour, Jeannett SeniorStephen, MD  insulin NPH-regular (NOVOLIN 70/30) (70-30) 100 UNIT/ML injection Inject 13 Units into the skin 2 (two) times daily with a meal. Patient taking differently: Inject 10 Units into the skin daily with supper.  04/08/13   Dhungel, Theda BelfastNishant, MD  nitroGLYCERIN (NITRODUR - DOSED IN MG/24 HR) 0.2 mg/hr patch Apply 1/4th patch to affected elbow/forearm, change daily 08/06/18   Hudnall, Azucena FallenShane R, MD  omeprazole (PRILOSEC) 40 MG capsule Take 40 mg by mouth daily.    [provider]  pantoprazole (PROTONIX) 20 MG tablet Take 1 tablet (20 mg total) by mouth daily. Patient not taking: Reported on 01/03/2018 05/26/17  Malvin Johns, MD    Family History Family History  Problem Relation Age of Onset   Cancer Mother    Diabetes Mother    Hypertension Mother     Social History Social History   Tobacco Use   Smoking status: Former Smoker    Packs/day: 0.50    Years: 2.00    Pack years: 1.00    Types: Cigarettes    Quit date: 07/22/2011    Years since quitting: 7.2   Smokeless tobacco: Never Used  Substance Use Topics   Alcohol use: Yes    Alcohol/week: 0.0 standard drinks    Comment: occ   Drug use: No      Allergies   Vancomycin and Adhesive [tape]   Review of Systems Review of Systems  Constitutional: Negative for chills, diaphoresis and fever.  Respiratory: Negative for shortness of breath.   Cardiovascular: Negative for chest pain.  Gastrointestinal: Positive for abdominal pain, diarrhea, nausea and vomiting. Negative for blood in stool and constipation.  Genitourinary: Negative for dysuria, frequency and urgency.  Musculoskeletal: Negative for back pain.  Skin: Negative for rash and wound.  Allergic/Immunologic: Positive for immunocompromised state.  Neurological: Negative for weakness.  Hematological: Negative for adenopathy.  Psychiatric/Behavioral: Negative for confusion.  All other systems reviewed and are negative.    Physical Exam Updated Vital Signs BP 106/90 (BP Location: Left Arm)    Pulse (!) 102    Temp 98.3 F (36.8 C) (Oral)    Resp 20    Ht 5\' 1"  (1.549 m)    Wt 134.7 kg    SpO2 98%    BMI 56.12 kg/m   Physical Exam Vitals signs and nursing note reviewed.  Constitutional:      General: She is not in acute distress.    Appearance: She is well-developed. She is obese. She is not diaphoretic.  HENT:     Head: Normocephalic and atraumatic.  Cardiovascular:     Rate and Rhythm: Normal rate and regular rhythm.     Heart sounds: Normal heart sounds.  Pulmonary:     Effort: Pulmonary effort is normal.     Breath sounds: Normal breath sounds.  Abdominal:     Tenderness: There is abdominal tenderness in the right lower quadrant. There is no right CVA tenderness or left CVA tenderness.  Skin:    General: Skin is warm and dry.     Findings: No erythema or rash.  Neurological:     Mental Status: She is alert and oriented to person, place, and time.  Psychiatric:        Behavior: Behavior normal.      ED Treatments / Results  Labs (all labs ordered are listed, but only abnormal results are displayed) Labs Reviewed  URINALYSIS, ROUTINE W REFLEX  MICROSCOPIC - Abnormal; Notable for the following components:      Result Value   APPearance HAZY (*)    Specific Gravity, Urine >1.030 (*)    Glucose, UA >=500 (*)    All other components within normal limits  URINALYSIS, MICROSCOPIC (REFLEX) - Abnormal; Notable for the following components:   Bacteria, UA FEW (*)    All other components within normal limits  COMPREHENSIVE METABOLIC PANEL - Abnormal; Notable for the following components:   Glucose, Bld 317 (*)    Calcium 8.6 (*)    Albumin 3.2 (*)    All other components within normal limits  CBC - Abnormal; Notable for the following components:   WBC 12.1 (*)  All other components within normal limits  PREGNANCY, URINE  LIPASE, BLOOD    EKG None  Radiology Ct Abdomen Pelvis W Contrast  Result Date: 10/31/2018 CLINICAL DATA:  Abdomen pain with nausea vomiting and diarrhea EXAM: CT ABDOMEN AND PELVIS WITH CONTRAST TECHNIQUE: Multidetector CT imaging of the abdomen and pelvis was performed using the standard protocol following bolus administration of intravenous contrast. CONTRAST:  100mL OMNIPAQUE IOHEXOL 300 MG/ML  SOLN COMPARISON:  May 26, 2017 FINDINGS: Lower chest: Minimal atelectasis of posterior lung bases are noted. Hepatobiliary: No focal liver abnormality is seen. Status post cholecystectomy. No biliary dilatation. Pancreas: Unremarkable. No pancreatic ductal dilatation or surrounding inflammatory changes. Spleen: Normal in size without focal abnormality. Adrenals/Urinary Tract: The bilateral adrenal glands are normal. There is a cyst in the mid pole right kidney. The left kidney is normal. No hydronephrosis is noted bilaterally. The bladder is normal. Stomach/Bowel: Stomach is within normal limits. Appendix appears normal. No evidence of bowel wall thickening, distention, or inflammatory changes. Vascular/Lymphatic: No significant vascular findings are present. No enlarged abdominal or pelvic lymph nodes. Reproductive: The  uterus is normal. Bilateral tubal ligation clips are noted. There is probably a 2.5 cm cyst in the left ovary. Other: None. Musculoskeletal: Degenerative joint changes of the spine are identified. IMPRESSION: No bowel obstruction or diverticulitis.  The appendix is normal. No acute abnormality is identified in the abdomen and pelvis. Electronically Signed   By: Sherian ReinWei-Chen  Lin M.D.   On: 10/31/2018 20:16    Procedures Procedures (including critical care time)  Medications Ordered in ED Medications  sodium chloride 0.9 % bolus 1,000 mL (0 mLs Intravenous Stopped 10/31/18 1955)  ondansetron (ZOFRAN) injection 4 mg (4 mg Intravenous Given 10/31/18 1824)  morphine 4 MG/ML injection 4 mg (4 mg Intravenous Given 10/31/18 1824)  iohexol (OMNIPAQUE) 300 MG/ML solution 100 mL (100 mLs Intravenous Contrast Given 10/31/18 1956)     Initial Impression / Assessment and Plan / ED Course  I have reviewed the triage vital signs and the nursing notes.  Pertinent labs & imaging results that were available during my care of the patient were reviewed by me and considered in my medical decision making (see chart for details).  Clinical Course as of Oct 31 2046  Wed Oct 31, 2018  [id number] 48 year old female presents with complaint of right lower quadrant abdominal pain with nausea, vomiting, diarrhea.  On exam patient has tenderness to right lower quadrant, exam limited by parents.  Lab work shows elevated white count at 12.1, CMP with glucose of 317, patient has known diabetes.  Urinalysis with glucose, negative pregnancy test.  CT of the abdomen pelvis with contrast does not show any acute findings.   [LM]    Clinical Course User Index [LM] Jeannie FendMurphy, Braydon Kullman A, PA-C      Final Clinical Impressions(s) / ED Diagnoses   Final diagnoses:  Right lower quadrant abdominal pain    ED Discharge Orders    None       Alden HippMurphy, Aleana Fifita A, PA-C 10/31/18 2048    Linwood DibblesKnapp, Jon, MD 11/01/18 1201

## 2018-10-31 NOTE — ED Triage Notes (Signed)
Pt c/o abd pain-n/v/d/ started last night-NAD-steady gait

## 2018-12-19 IMAGING — CT CT RENAL STONE PROTOCOL
2 of 4 series · 17 of 46 positions shown, 19 images · non-contrast
Comparison: 05/15/2017

CLINICAL DATA: Right-sided flank pain for several weeks

EXAM:
CT ABDOMEN AND PELVIS WITHOUT CONTRAST
TECHNIQUE: Multidetector CT imaging of the abdomen and pelvis was performed
following the standard protocol without IV contrast.

[Series 3: ap without · axial · non-contrast · 0.89mm/px · z∈[+929,+1334]mm · 14 of 93 slices shown, 16 images]
[im 6/93  soft-tissue]
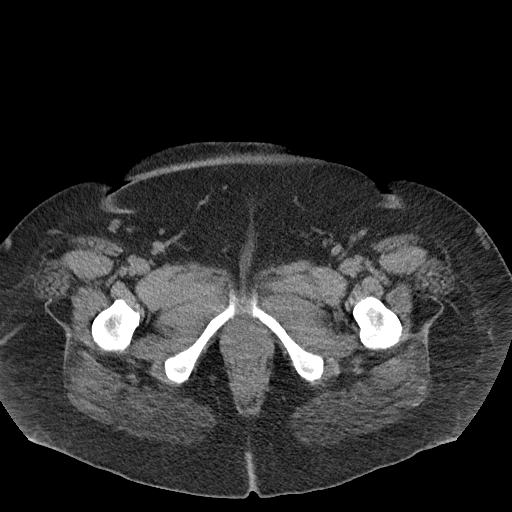
[im 6/93  bone]
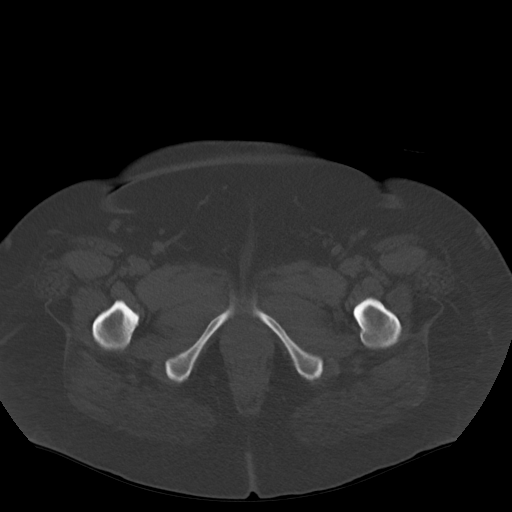
[im 11/93  soft-tissue]
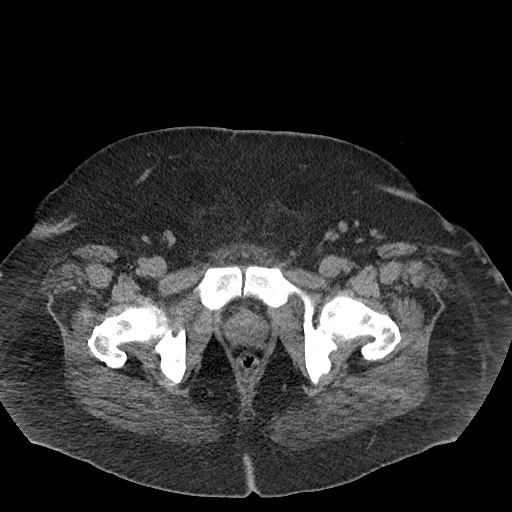
[im 17/93  soft-tissue]
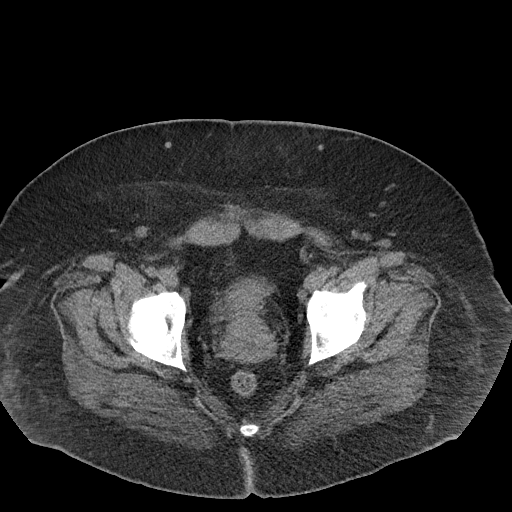
[im 28/93  soft-tissue]
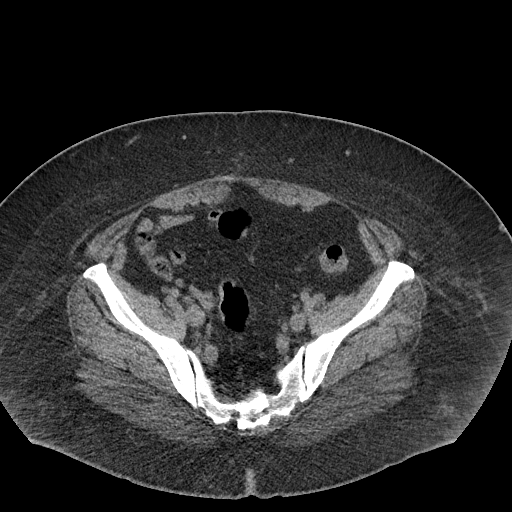
[im 33/93  soft-tissue]
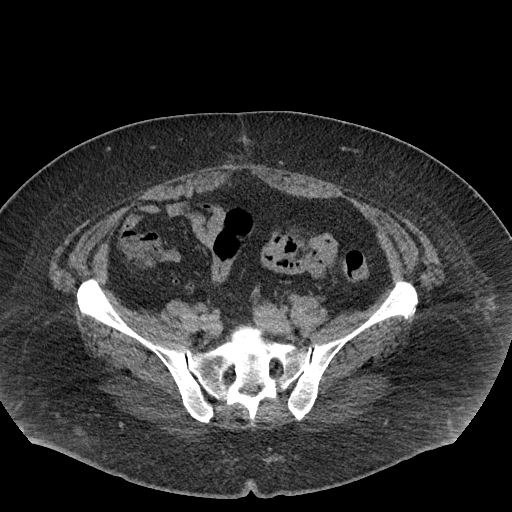
[im 38/93  soft-tissue]
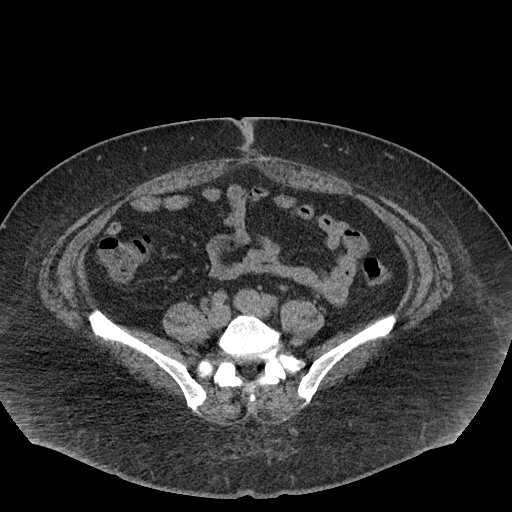
[im 44/93  soft-tissue]
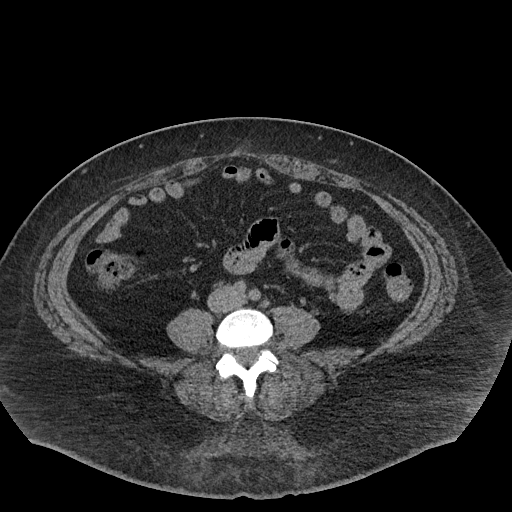
[im 49/93  soft-tissue]
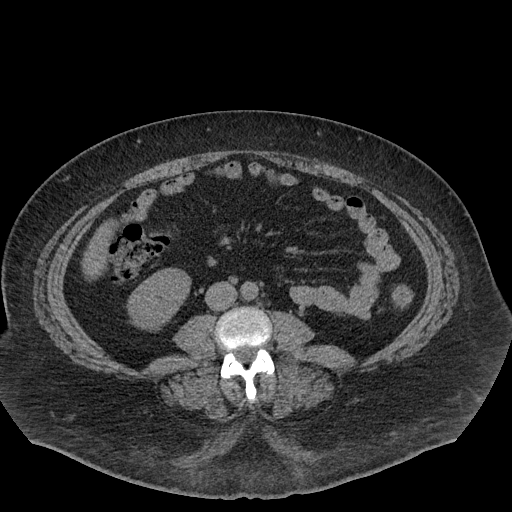
[im 55/93  soft-tissue]
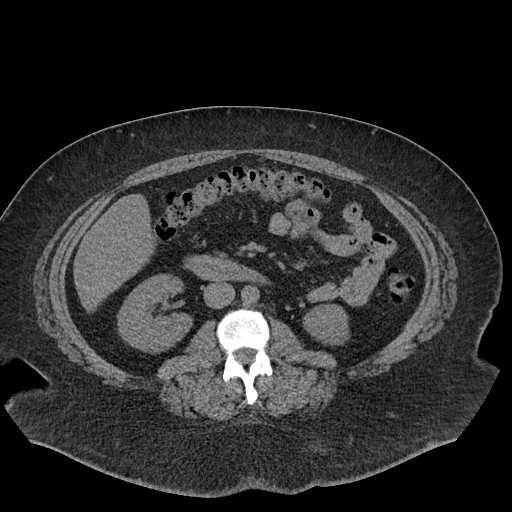
[im 55/93  bone]
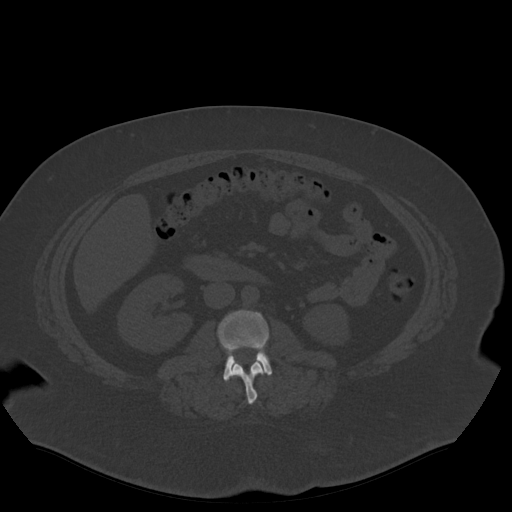
[im 60/93  soft-tissue]
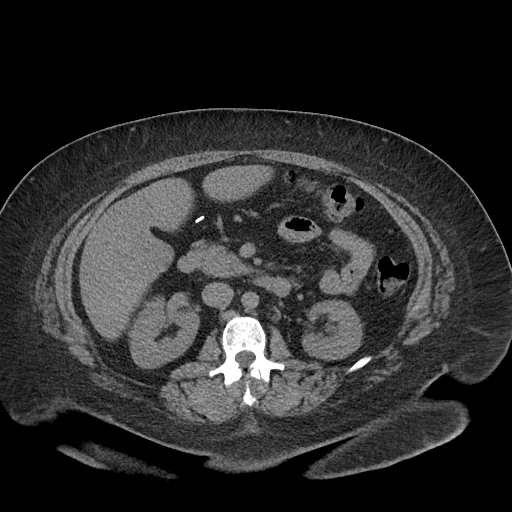
[im 71/93  soft-tissue]
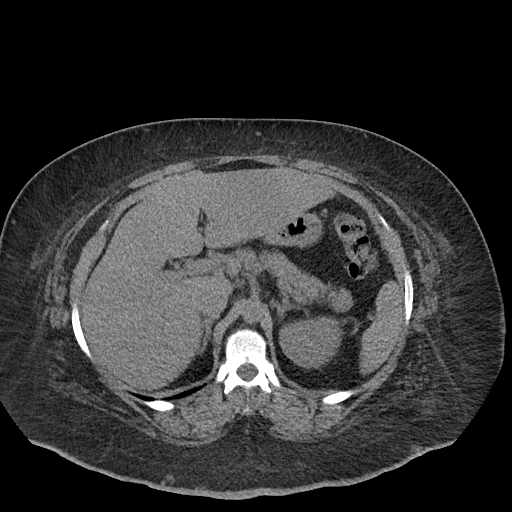
[im 76/93  soft-tissue]
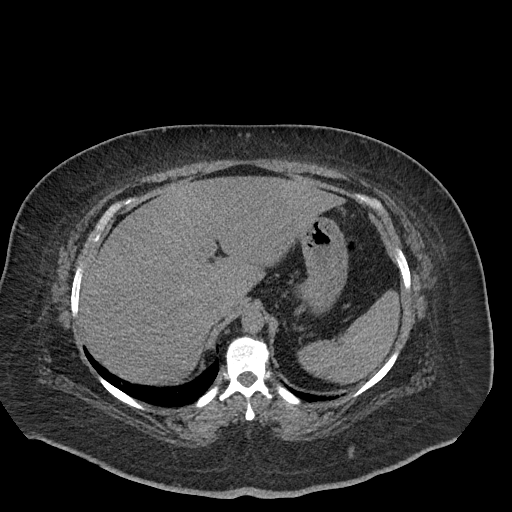
[im 82/93  soft-tissue]
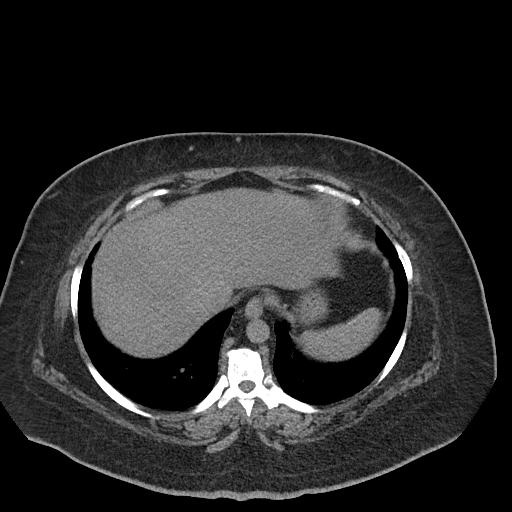
[im 87/93  soft-tissue]
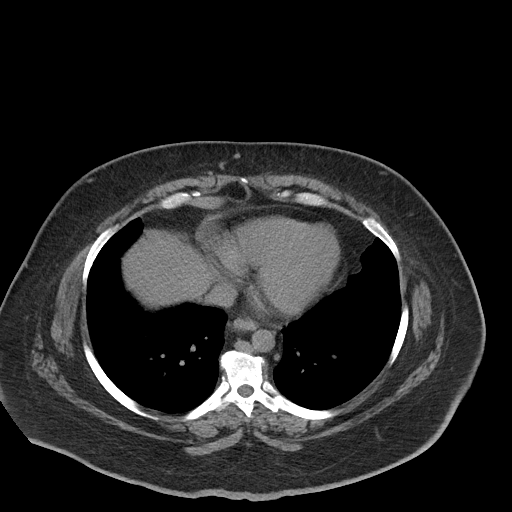

[Series 6: cor · coronal · 0.90mm/px · 3 of 111 slices shown]
[im 37/111  soft-tissue]
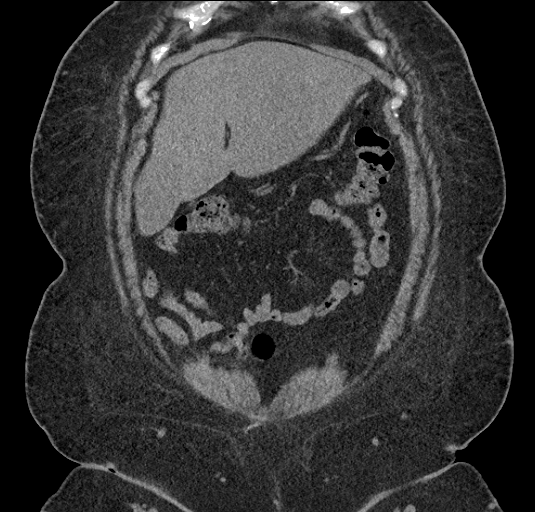
[im 49/111  soft-tissue]
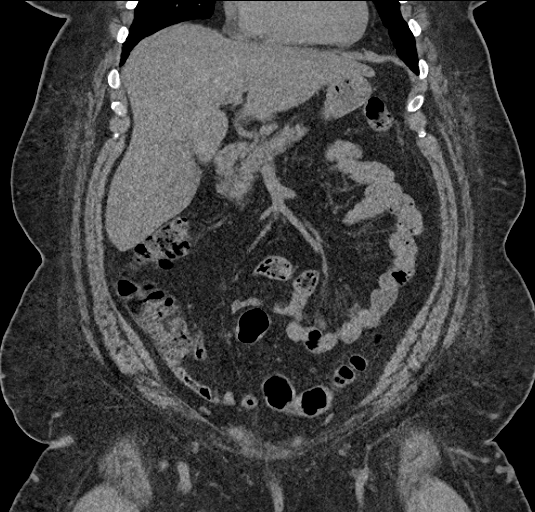
[im 62/111  soft-tissue]
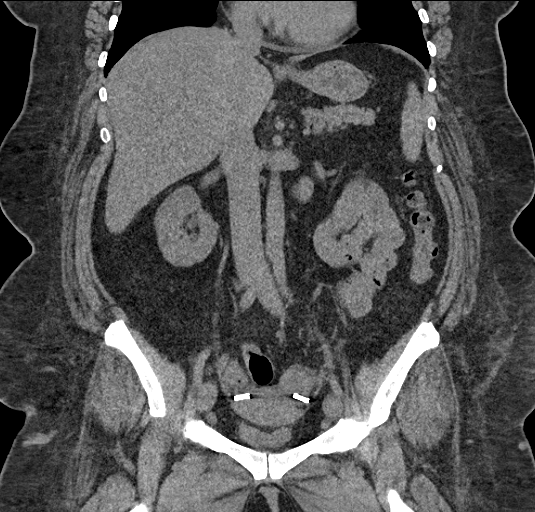

[17 of 46 positions shown; findings below may reference images not displayed]

FINDINGS: Lower chest: No acute abnormality.

Hepatobiliary: No focal liver abnormality is seen. Status post
cholecystectomy. No biliary dilatation.

Pancreas: Unremarkable. No pancreatic ductal dilatation or
surrounding inflammatory changes.

Spleen: Normal in size without focal abnormality.

Adrenals/Urinary Tract: The adrenal glands are within normal limits.
Kidneys are well visualized bilaterally without renal calculi or
urinary tract obstructive changes. Bladder is decompressed.

Stomach/Bowel: Stomach is within normal limits. Appendix appears
normal. No evidence of bowel wall thickening, distention, or
inflammatory changes.

Vascular/Lymphatic: No significant vascular findings are present. No
enlarged abdominal or pelvic lymph nodes.

Reproductive: Uterus and bilateral adnexa are unremarkable.
Bilateral tubal ligation is noted.

Other: Tiny fat containing umbilical hernia is again seen.. No
abdominopelvic ascites.

Musculoskeletal: No acute or significant osseous findings.
IMPRESSION: No acute abnormality noted. No significant change from the prior
exam is seen.

## 2019-01-07 DIAGNOSIS — M65312 Trigger thumb, left thumb: Secondary | ICD-10-CM | POA: Insufficient documentation

## 2019-02-22 DIAGNOSIS — M5416 Radiculopathy, lumbar region: Secondary | ICD-10-CM | POA: Insufficient documentation

## 2019-10-14 ENCOUNTER — Encounter (HOSPITAL_BASED_OUTPATIENT_CLINIC_OR_DEPARTMENT_OTHER): Payer: Self-pay | Admitting: *Deleted

## 2019-10-14 ENCOUNTER — Emergency Department (HOSPITAL_BASED_OUTPATIENT_CLINIC_OR_DEPARTMENT_OTHER)
Admission: EM | Admit: 2019-10-14 | Discharge: 2019-10-14 | Disposition: A | Discharge status: Home / Self Care | Payer: Self-pay | Attending: Emergency Medicine | Admitting: Emergency Medicine

## 2019-10-14 ENCOUNTER — Emergency Department (HOSPITAL_BASED_OUTPATIENT_CLINIC_OR_DEPARTMENT_OTHER): Payer: Self-pay

## 2019-10-14 DIAGNOSIS — E119 Type 2 diabetes mellitus without complications: Secondary | ICD-10-CM | POA: Insufficient documentation

## 2019-10-14 DIAGNOSIS — R197 Diarrhea, unspecified: Secondary | ICD-10-CM | POA: Insufficient documentation

## 2019-10-14 DIAGNOSIS — Z87891 Personal history of nicotine dependence: Secondary | ICD-10-CM | POA: Insufficient documentation

## 2019-10-14 DIAGNOSIS — Z794 Long term (current) use of insulin: Secondary | ICD-10-CM | POA: Insufficient documentation

## 2019-10-14 DIAGNOSIS — R110 Nausea: Secondary | ICD-10-CM

## 2019-10-14 DIAGNOSIS — R1084 Generalized abdominal pain: Secondary | ICD-10-CM | POA: Insufficient documentation

## 2019-10-14 DIAGNOSIS — Z79899 Other long term (current) drug therapy: Secondary | ICD-10-CM | POA: Insufficient documentation

## 2019-10-14 DIAGNOSIS — R1010 Upper abdominal pain, unspecified: Secondary | ICD-10-CM

## 2019-10-14 LAB — URINALYSIS, MICROSCOPIC (REFLEX)

## 2019-10-14 LAB — CBC
HCT: 43.7 % (ref 36.0–46.0)
Hemoglobin: 14.2 g/dL (ref 12.0–15.0)
MCH: 28 pg (ref 26.0–34.0)
MCHC: 32.5 g/dL (ref 30.0–36.0)
MCV: 86.2 fL (ref 80.0–100.0)
Platelets: 327 10*3/uL (ref 150–400)
RBC: 5.07 MIL/uL (ref 3.87–5.11)
RDW: 13.2 % (ref 11.5–15.5)
WBC: 11.6 10*3/uL — ABNORMAL HIGH (ref 4.0–10.5)
nRBC: 0 % (ref 0.0–0.2)

## 2019-10-14 LAB — COMPREHENSIVE METABOLIC PANEL
ALT: 20 U/L (ref 0–44)
AST: 16 U/L (ref 15–41)
Albumin: 3.5 g/dL (ref 3.5–5.0)
Alkaline Phosphatase: 112 U/L (ref 38–126)
Anion gap: 9 (ref 5–15)
BUN: 13 mg/dL (ref 6–20)
CO2: 28 mmol/L (ref 22–32)
Calcium: 9.3 mg/dL (ref 8.9–10.3)
Chloride: 99 mmol/L (ref 98–111)
Creatinine, Ser: 0.64 mg/dL (ref 0.44–1.00)
GFR calc Af Amer: >60 mL/min (ref >60)
GFR calc non Af Amer: >60 mL/min (ref >60)
Glucose, Bld: 317 mg/dL — ABNORMAL HIGH (ref 70–99)
Potassium: 3.8 mmol/L (ref 3.5–5.1)
Sodium: 136 mmol/L (ref 135–145)
Total Bilirubin: 0.4 mg/dL (ref 0.3–1.2)
Total Protein: 7.8 g/dL (ref 6.5–8.1)

## 2019-10-14 LAB — URINALYSIS, ROUTINE W REFLEX MICROSCOPIC
Bilirubin Urine: NEGATIVE
Glucose, UA: >=500 mg/dL — AB
Hgb urine dipstick: NEGATIVE
Ketones, ur: NEGATIVE mg/dL
Leukocytes,Ua: NEGATIVE
Nitrite: NEGATIVE
Protein, ur: NEGATIVE mg/dL
Specific Gravity, Urine: >1.03 — ABNORMAL HIGH (ref 1.005–1.030)
pH: 5.5 (ref 5.0–8.0)

## 2019-10-14 LAB — LIPASE, BLOOD: Lipase: 42 U/L (ref 11–51)

## 2019-10-14 LAB — PREGNANCY, URINE: Preg Test, Ur: NEGATIVE

## 2019-10-14 MED ORDER — PROMETHAZINE HCL 25 MG PO TABS
25.0000 mg | ORAL_TABLET | Freq: Four times a day (QID) | ORAL | 0 refills | Status: DC | PRN
Start: 2019-10-14 — End: 2020-11-10

## 2019-10-14 MED ORDER — MORPHINE SULFATE (PF) 4 MG/ML IV SOLN
4.0000 mg | Freq: Once | INTRAVENOUS | Status: AC
  Administered 2019-10-14: 4 mg via INTRAVENOUS
  Filled 2019-10-14: qty 1

## 2019-10-14 MED ORDER — SODIUM CHLORIDE 0.9% FLUSH
3.0000 mL | Freq: Once | INTRAVENOUS | Status: DC
  Filled 2019-10-14: qty 3

## 2019-10-14 MED ORDER — SODIUM CHLORIDE 0.9 % IV BOLUS
1000.0000 mL | Freq: Once | INTRAVENOUS | Status: AC
  Administered 2019-10-14: 1000 mL via INTRAVENOUS

## 2019-10-14 MED ORDER — IOHEXOL 300 MG/ML  SOLN
100.0000 mL | Freq: Once | INTRAMUSCULAR | Status: AC | PRN
  Administered 2019-10-14: 100 mL via INTRAVENOUS

## 2019-10-14 MED ORDER — ONDANSETRON HCL 4 MG/2ML IJ SOLN
4.0000 mg | Freq: Once | INTRAMUSCULAR | Status: AC
  Administered 2019-10-14: 4 mg via INTRAVENOUS
  Filled 2019-10-14: qty 2

## 2019-10-14 NOTE — ED Provider Notes (Signed)
[facility] EMERGENCY DEPARTMENT Provider Note   CSN: [id number] Arrival date & time: 10/14/19  1400     History Chief Complaint  Patient presents with  . Abdominal Pain    [name] is a 49 y.o. female.  The history is provided by the patient, medical records and the spouse. No language interpreter was used.  Abdominal Pain Pain location:  Generalized Pain quality: aching, cramping and sharp   Pain radiates to:  Does not radiate Pain severity:  Severe Onset quality:  Gradual Duration:  3 days Timing:  Constant Progression:  Worsening Chronicity:  New Relieved by:  Nothing Worsened by:  Nothing Ineffective treatments:  Acetaminophen Associated symptoms: diarrhea and nausea   Associated symptoms: no anorexia, no chest pain, no chills, no constipation, no cough, no dysuria, no fatigue, no fever, no shortness of breath, no vaginal bleeding, no vaginal discharge and no vomiting        Past Medical History:  Diagnosis Date  . Cellulitis 07/2011  . Diabetes mellitus    dx 2016  . GERD (gastroesophageal reflux disease)     Patient Active Problem List   Diagnosis Date Noted  . Cellulitis and abscess of right leg 01/02/2018  . Bilateral carpal tunnel syndrome 07/06/2016  . Left elbow pain 02/17/2016  . Tobacco abuse 10/29/2015  . Injury of right hand 05/19/2015  . Cervical disc disorder with radiculopathy of cervical region 10/10/2013  . Diabetes (HCC) 04/07/2013  . Pyelonephritis 04/06/2013  . RLQ abdominal pain 04/06/2013  . Fever 09/07/2012  . Headache(784.0) 09/07/2012  . Cellulitis 08/05/2011  . Peripheral edema 08/05/2011  . GERD (gastroesophageal reflux disease) 08/05/2011  . Obesity 08/05/2011    Past Surgical History:  Procedure Laterality Date  . ABLATION    . CARPAL TUNNEL RELEASE Left 09/13/2016   Procedure: LEFT CARPAL TUNNEL RELEASE;  Surgeon: Cindee Salt, MD;  Location: [facility] OR;  Service: Orthopedics;  Laterality: Left;  .  CHOLECYSTECTOMY    . KNEE ARTHROSCOPY    . LAPAROSCOPIC TUBAL LIGATION  04/29/2011   Procedure: LAPAROSCOPIC TUBAL LIGATION;  Surgeon: Turner Daniels, MD;  Location: [facility];  Service: Gynecology;  Laterality: Bilateral;  with Filshie Clips  . ROTATOR CUFF REPAIR    . TUBAL LIGATION       OB History   No obstetric history on file.     Family History  Problem Relation Age of Onset  . Cancer Mother   . Diabetes Mother   . Hypertension Mother     Social History   Tobacco Use  . Smoking status: Former Smoker    Packs/day: 0.50    Years: 2.00    Pack years: 1.00    Types: Cigarettes    Quit date: 07/22/2011    Years since quitting: 8.2  . Smokeless tobacco: Never Used  Vaping Use  . Vaping Use: Never used  Substance Use Topics  . Alcohol use: Yes    Alcohol/week: 0.0 standard drinks    Comment: occ  . Drug use: No    Home Medications Prior to Admission medications   Medication Sig Start Date End Date Taking? Authorizing Provider  acetaminophen (TYLENOL) 500 MG tablet Take 1,000 mg by mouth every 6 (six) hours as needed for mild pain.   Yes [provider]  diclofenac (VOLTAREN) 75 MG EC tablet Take 1 tablet (75 mg total) by mouth 2 (two) times daily. 07/04/18  Yes Hudnall, Azucena Fallen, MD  ibuprofen (ADVIL,MOTRIN) 400 MG tablet Take 1  tablet (400 mg total) by mouth every 8 (eight) hours as needed for moderate pain. 06/24/18  Yes Rancour, Annie Main, MD  insulin NPH-regular (NOVOLIN 70/30) (70-30) 100 UNIT/ML injection Inject 13 Units into the skin 2 (two) times daily with a meal. Patient taking differently: Inject 10 Units into the skin daily with supper.  04/08/13  Yes Dhungel, Nishant, MD  nitroGLYCERIN (NITRODUR - DOSED IN MG/24 HR) 0.2 mg/hr patch Apply 1/4th patch to affected elbow/forearm, change daily 08/06/18  Yes Hudnall, Sharyn Lull, MD  omeprazole (PRILOSEC) 40 MG capsule Take 40 mg by mouth daily.   Yes [provider]  pantoprazole (PROTONIX) 20 MG tablet Take 1  tablet (20 mg total) by mouth daily. 05/26/17  Yes Malvin Johns, MD    Allergies    Vancomycin and Adhesive [tape]  Review of Systems   Review of Systems  Constitutional: Negative for chills, diaphoresis, fatigue and fever.  HENT: Negative for congestion.   Eyes: Negative for visual disturbance.  Respiratory: Negative for cough, chest tightness, shortness of breath and wheezing.   Cardiovascular: Negative for chest pain, palpitations and leg swelling.  Gastrointestinal: Positive for abdominal pain, diarrhea and nausea. Negative for abdominal distention, anal bleeding, anorexia, blood in stool, constipation, rectal pain and vomiting.  Genitourinary: Negative for dysuria, flank pain, frequency, vaginal bleeding and vaginal discharge.  Musculoskeletal: Negative for back pain, neck pain and neck stiffness.  Skin: Negative for rash and wound.  Neurological: Positive for headaches. Negative for dizziness, weakness, light-headedness and numbness.  Psychiatric/Behavioral: Negative for agitation and confusion.  All other systems reviewed and are negative.   Physical Exam Updated Vital Signs BP 111/63   Pulse 89   Temp 97.8 F (36.6 C) (Oral)   Resp 20   Ht 5\' 1"  (1.549 m)   Wt 132.6 kg   SpO2 98%   BMI 55.25 kg/m   Physical Exam Vitals and nursing note reviewed.  Constitutional:      General: She is not in acute distress.    Appearance: She is well-developed. She is not ill-appearing, toxic-appearing or diaphoretic.  HENT:     Head: Normocephalic and atraumatic.     Mouth/Throat:     Mouth: Mucous membranes are moist.  Eyes:     Extraocular Movements: Extraocular movements intact.     Conjunctiva/sclera: Conjunctivae normal.  Cardiovascular:     Rate and Rhythm: Normal rate and regular rhythm.     Heart sounds: No murmur heard.   Pulmonary:     Effort: Pulmonary effort is normal. No respiratory distress.     Breath sounds: Normal breath sounds. No wheezing, rhonchi or  rales.  Chest:     Chest wall: No tenderness.  Abdominal:     General: Abdomen is flat. Bowel sounds are normal. There is no distension.     Palpations: Abdomen is soft.     Tenderness: There is generalized abdominal tenderness. There is no right CVA tenderness, left CVA tenderness, guarding or rebound.  Musculoskeletal:     Cervical back: Neck supple.  Skin:    General: Skin is warm and dry.     Coloration: Skin is not pale.     Findings: No rash.  Neurological:     General: No focal deficit present.     Mental Status: She is alert.  Psychiatric:        Mood and Affect: Mood normal.     ED Results / Procedures / Treatments   Labs (all labs ordered are listed, but  only abnormal results are displayed) Labs Reviewed  COMPREHENSIVE METABOLIC PANEL - Abnormal; Notable for the following components:      Result Value   Glucose, Bld 317 (*)    All other components within normal limits  CBC - Abnormal; Notable for the following components:   WBC 11.6 (*)    All other components within normal limits  URINALYSIS, ROUTINE W REFLEX MICROSCOPIC - Abnormal; Notable for the following components:   Specific Gravity, Urine >1.030 (*)    Glucose, UA >=500 (*)    All other components within normal limits  URINALYSIS, MICROSCOPIC (REFLEX) - Abnormal; Notable for the following components:   Bacteria, UA MANY (*)    All other components within normal limits  URINE CULTURE  LIPASE, BLOOD  PREGNANCY, URINE    EKG None  Radiology CT ABDOMEN PELVIS W CONTRAST  Result Date: 10/14/2019 CLINICAL DATA:  Abdomen pain with chills and diarrhea EXAM: CT ABDOMEN AND PELVIS WITH CONTRAST TECHNIQUE: Multidetector CT imaging of the abdomen and pelvis was performed using the standard protocol following bolus administration of intravenous contrast. CONTRAST:  OMNIPAQUE IOHEXOL 300 MG/ML  SOLN COMPARISON:  CT 03/31/2019, 10/31/2018 FINDINGS: Lower chest: Lung bases demonstrate no acute consolidation  or pleural effusion. Cardiac size within normal limits. Hepatobiliary: Hepatic steatosis. No biliary dilatation. Status post cholecystectomy Pancreas: Unremarkable. No pancreatic ductal dilatation or surrounding inflammatory changes. Spleen: Normal in size without focal abnormality. Adrenals/Urinary Tract: Adrenal glands are normal. Subcentimeter hypodensity in the mid to lower right kidney too small to further characterize. No hydronephrosis. The bladder is normal Stomach/Bowel: Stomach is within normal limits. Appendix appears normal. No evidence of bowel wall thickening, distention, or inflammatory changes. Few sigmoid colon diverticula without acute inflammatory change. Vascular/Lymphatic: No significant vascular findings are present. No enlarged abdominal or pelvic lymph nodes. Reproductive: Bilateral tubal ligation clips. No suspicious adnexal mass. 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 IMPRESSION: 1. No CT evidence for acute intra-abdominal or pelvic abnormality. 2. Hepatic steatosis. Electronically Signed   By: Jasmine Pang M.D.   On: 10/14/2019 18:47    Procedures Procedures (including critical care time)  Medications Ordered in ED Medications  sodium chloride flush (NS) 0.9 % injection 3 mL (has no administration in time range)  ondansetron (ZOFRAN) injection 4 mg (4 mg Intravenous Given 10/14/19 1803)  morphine 4 MG/ML injection 4 mg (4 mg Intravenous Given 10/14/19 1803)  sodium chloride 0.9 % bolus 1,000 mL ( Intravenous Stopped 10/14/19 1938)  iohexol (OMNIPAQUE) 300 MG/ML solution 100 mL (100 mLs Intravenous Contrast Given 10/14/19 1822)    ED Course  I have reviewed the triage vital signs and the nursing notes.  Pertinent labs & imaging results that were available during my care of the patient were reviewed by me and considered in my medical decision making (see chart for details).    MDM  Rules/Calculators/A&P                          [name] is a 49 y.o. female with a past medical history significant for prior gallbladder movable, diabetes, GERD, obesity, and chronic peripheral edema who presents with nausea, diarrhea, abdominal pain, malaise, headache, and chills.  Patient reports that her symptoms of been ongoing for the last few days.  She does say that she ate some pistachios before her symptoms began.  She denies any personal history of diverticulitis  but does report her son had severe cases of diverticulitis in the past.  She is concerned about that.  She reports there is been no blood in her diarrhea and she is not any vomiting.  She denies any urinary symptoms.  She denies any pelvic symptoms.  She denies any recent trauma.  She denies any respiratory complaints including no chest pain, palpitations, shortness of breath, cough.  She denies any neck pain or neck stiffness.  No recent other complaints.  She reports she is taken some Tylenol without significant relief of her symptoms.  On exam, patient has diffuse abdominal tenderness.  She had no rebound tenderness or guarding.  Bowel sounds were appreciated.  No flank tenderness and no rash was seen.  Lungs clear and chest was nontender.  Patient has peripheral edema which she reports is unchanged from her baseline.  Patient is otherwise resting comfortably.  Vital signs reassuring on arrival.  Clinically I am concerned the patient may have a colitis or gastroenteritis causing her symptoms.  Given the not intake before it began in her family history of diverticulitis, we agreed to get imaging to look for diverticulitis.  We will get urine and other labs.  She will be on pain medicine, nausea medicine, and fluids as her mucous membranes do appear dry on exam.  Anticipate reassessment after work-up.  CT scan did not show diverticulitis.  Patient is feeling better after medications.  Labs showed mild leukocytosis but  otherwise is reassuring.  Suspect gastroenteritis versus gastric irritation related to the pistachios she ate before her symptoms began.  Patient given prescription for nausea medication and will follow up with a PCP.  She understood return precautions and follow-up instructions and was discharged in good condition.    Final Clinical Impression(s) / ED Diagnoses Final diagnoses:  Nausea  Diarrhea, unspecified type  Pain of upper abdomen    Rx / DC Orders ED Discharge Orders         Ordered    promethazine (PHENERGAN) 25 MG tablet  Every 6 hours PRN     Discontinue  Reprint     10/14/19 2215         Clinical Impression: 1. Nausea   2. Diarrhea, unspecified type   3. Pain of upper abdomen     Disposition: Discharge  Condition: Good  I have discussed the results, Dx and Tx plan with the pt(& family if present). He/she/they expressed understanding and agree(s) with the plan. Discharge instructions discussed at great length. Strict return precautions discussed and pt &/or family have verbalized understanding of the instructions. No further questions at time of discharge.    Discharge Medication List as of 10/14/2019 10:16 PM    START taking these medications   Details  promethazine (PHENERGAN) 25 MG tablet Take 1 tablet (25 mg total) by mouth every 6 (six) hours as needed for nausea or vomiting., Starting Mon 10/14/2019, Print        Follow Up: Smothers, Cathleen Corti, NP [address] [place] [address] [phone]     [facility] [place] [facility] [address] [id number] ES [id number] [place] [address] [phone]       Umar Patmon, Canary Brim, MD 10/15/19 [phone]

## 2019-10-14 NOTE — ED Triage Notes (Signed)
Abdominal pain, chills, diarrhea, headache, fatigue x 3 days.

## 2019-10-14 NOTE — Discharge Instructions (Signed)
Your work-up today was overall reassuring.  Your electrolytes did not show significant dehydration and the CT scan did not show diverticulitis.  Clinically I suspect gastroenteritis or other functional diarrhea.  Please use the nausea medicine to maintain hydration and follow-up with your primary doctor.  If any symptoms change or worsen, please return to the nearest emergency department.

## 2019-10-16 LAB — URINE CULTURE

## 2019-10-17 ENCOUNTER — Emergency Department (HOSPITAL_BASED_OUTPATIENT_CLINIC_OR_DEPARTMENT_OTHER)
Admission: EM | Admit: 2019-10-17 | Discharge: 2019-10-17 | Disposition: A | Discharge status: Home / Self Care | Payer: Self-pay | Attending: Emergency Medicine | Admitting: Emergency Medicine

## 2019-10-17 ENCOUNTER — Other Ambulatory Visit: Payer: Self-pay

## 2019-10-17 ENCOUNTER — Encounter (HOSPITAL_BASED_OUTPATIENT_CLINIC_OR_DEPARTMENT_OTHER): Payer: Self-pay

## 2019-10-17 DIAGNOSIS — Z794 Long term (current) use of insulin: Secondary | ICD-10-CM | POA: Insufficient documentation

## 2019-10-17 DIAGNOSIS — Z87891 Personal history of nicotine dependence: Secondary | ICD-10-CM | POA: Insufficient documentation

## 2019-10-17 DIAGNOSIS — R109 Unspecified abdominal pain: Secondary | ICD-10-CM

## 2019-10-17 DIAGNOSIS — R1011 Right upper quadrant pain: Secondary | ICD-10-CM | POA: Insufficient documentation

## 2019-10-17 DIAGNOSIS — E119 Type 2 diabetes mellitus without complications: Secondary | ICD-10-CM | POA: Insufficient documentation

## 2019-10-17 LAB — CBC WITH DIFFERENTIAL/PLATELET
Abs Immature Granulocytes: 0.05 10*3/uL (ref 0.00–0.07)
Basophils Absolute: 0 10*3/uL (ref 0.0–0.1)
Basophils Relative: 0 %
Eosinophils Absolute: 0.1 10*3/uL (ref 0.0–0.5)
Eosinophils Relative: 1 %
HCT: 44.1 % (ref 36.0–46.0)
Hemoglobin: 14.2 g/dL (ref 12.0–15.0)
Immature Granulocytes: 0 %
Lymphocytes Relative: 23 %
Lymphs Abs: 2.7 10*3/uL (ref 0.7–4.0)
MCH: 27.5 pg (ref 26.0–34.0)
MCHC: 32.2 g/dL (ref 30.0–36.0)
MCV: 85.5 fL (ref 80.0–100.0)
Monocytes Absolute: 0.7 10*3/uL (ref 0.1–1.0)
Monocytes Relative: 6 %
Neutro Abs: 8.3 10*3/uL — ABNORMAL HIGH (ref 1.7–7.7)
Neutrophils Relative %: 70 %
Platelets: 392 10*3/uL (ref 150–400)
RBC: 5.16 MIL/uL — ABNORMAL HIGH (ref 3.87–5.11)
RDW: 13.2 % (ref 11.5–15.5)
WBC: 11.9 10*3/uL — ABNORMAL HIGH (ref 4.0–10.5)
nRBC: 0 % (ref 0.0–0.2)

## 2019-10-17 LAB — COMPREHENSIVE METABOLIC PANEL WITH GFR
ALT: 20 U/L (ref 0–44)
AST: 16 U/L (ref 15–41)
Albumin: 3.3 g/dL — ABNORMAL LOW (ref 3.5–5.0)
Alkaline Phosphatase: 87 U/L (ref 38–126)
Anion gap: 10 (ref 5–15)
BUN: 12 mg/dL (ref 6–20)
CO2: 27 mmol/L (ref 22–32)
Calcium: 8.7 mg/dL — ABNORMAL LOW (ref 8.9–10.3)
Chloride: 102 mmol/L (ref 98–111)
Creatinine, Ser: 0.74 mg/dL (ref 0.44–1.00)
GFR calc Af Amer: >60 mL/min
GFR calc non Af Amer: >60 mL/min
Glucose, Bld: 217 mg/dL — ABNORMAL HIGH (ref 70–99)
Potassium: 3.7 mmol/L (ref 3.5–5.1)
Sodium: 139 mmol/L (ref 135–145)
Total Bilirubin: 0.6 mg/dL (ref 0.3–1.2)
Total Protein: 7.2 g/dL (ref 6.5–8.1)

## 2019-10-17 LAB — LIPASE, BLOOD: Lipase: 32 U/L (ref 11–51)

## 2019-10-17 LAB — OCCULT BLOOD X 1 CARD TO LAB, STOOL: Fecal Occult Bld: POSITIVE — AB

## 2019-10-17 MED ORDER — CIPROFLOXACIN HCL 500 MG PO TABS: 500.0000 mg | ORAL_TABLET | Freq: Two times a day (BID) | ORAL | 0 refills | Status: AC

## 2019-10-17 MED ORDER — AZITHROMYCIN 500 MG PO TABS: 500.0000 mg | ORAL_TABLET | Freq: Every day | ORAL | 0 refills | Status: DC

## 2019-10-17 NOTE — ED Provider Notes (Signed)
[facility] EMERGENCY DEPARTMENT Provider Note   CSN: [id number] Arrival date & time: 10/17/19  1355     History Chief Complaint  Patient presents with  . Abdominal Pain    [name] is a 49 y.o. female.  49 y/o female with a PMH of DM presents to the ED with a chief complaint of diarrhea x 1 week. Patient was evaluated in the ED 2 days ago, had a reassuring workup and discharged home with phenergan. Today, she returns to the ED for ongoing diarrhea which has now had a spot of blood in her stool. Has tried Gatorade, Sprite, [employer] mist, water.  No fever, no nausea, no vomiting, no recent travel.   The history is provided by the patient and medical records.  Abdominal Pain Pain location:  RUQ and RLQ Pain quality: aching and cramping   Pain radiates to:  Suprapubic region Pain severity:  Moderate Onset quality:  Gradual Duration:  6 days Timing:  Intermittent Progression:  Unchanged Chronicity:  Recurrent Context: not alcohol use, not diet changes, not eating, not laxative use, not recent travel and not suspicious food intake   Relieved by:  Not moving Worsened by:  Movement Ineffective treatments:  Acetaminophen Associated symptoms: diarrhea   Associated symptoms: no chest pain, no chills, no fever, no nausea, no shortness of breath, no sore throat and no vomiting   Risk factors: no alcohol abuse        Past Medical History:  Diagnosis Date  . Cellulitis 07/2011  . Diabetes mellitus    dx 2016  . GERD (gastroesophageal reflux disease)     Patient Active Problem List   Diagnosis Date Noted  . Cellulitis and abscess of right leg 01/02/2018  . Bilateral carpal tunnel syndrome 07/06/2016  . Left elbow pain 02/17/2016  . Tobacco abuse 10/29/2015  . Injury of right hand 05/19/2015  . Cervical disc disorder with radiculopathy of cervical region 10/10/2013  . Diabetes (HCC) 04/07/2013  . Pyelonephritis 04/06/2013  . RLQ abdominal pain 04/06/2013  .  Fever 09/07/2012  . Headache(784.0) 09/07/2012  . Cellulitis 08/05/2011  . Peripheral edema 08/05/2011  . GERD (gastroesophageal reflux disease) 08/05/2011  . Obesity 08/05/2011    Past Surgical History:  Procedure Laterality Date  . ABLATION    . CARPAL TUNNEL RELEASE Left 09/13/2016   Procedure: LEFT CARPAL TUNNEL RELEASE;  Surgeon: Cindee SaltKuzma, Gary, MD;  Location: [facility] OR;  Service: Orthopedics;  Laterality: Left;  . CHOLECYSTECTOMY    . KNEE ARTHROSCOPY    . LAPAROSCOPIC TUBAL LIGATION  04/29/2011   Procedure: LAPAROSCOPIC TUBAL LIGATION;  Surgeon: Turner Danielsavid C Lowe, MD;  Location: [facility];  Service: Gynecology;  Laterality: Bilateral;  with Filshie Clips  . ROTATOR CUFF REPAIR    . TUBAL LIGATION       OB History   No obstetric history on file.     Family History  Problem Relation Age of Onset  . Cancer Mother   . Diabetes Mother   . Hypertension Mother     Social History   Tobacco Use  . Smoking status: Former Smoker    Packs/day: 0.50    Years: 2.00    Pack years: 1.00    Types: Cigarettes    Quit date: 07/22/2011    Years since quitting: 8.2  . Smokeless tobacco: Never Used  Vaping Use  . Vaping Use: Never used  Substance Use Topics  . Alcohol use: Yes    Alcohol/week: 0.0 standard drinks  Comment: occ  . Drug use: No    Home Medications Prior to Admission medications   Medication Sig Start Date End Date Taking? Authorizing Provider  acetaminophen (TYLENOL) 500 MG tablet Take 1,000 mg by mouth every 6 (six) hours as needed for mild pain.    [provider]  ciprofloxacin (CIPRO) 500 MG tablet Take 1 tablet (500 mg total) by mouth 2 (two) times daily for 5 days. 10/17/19 10/22/19  Claude Manges, PA-C  diclofenac (VOLTAREN) 75 MG EC tablet Take 1 tablet (75 mg total) by mouth 2 (two) times daily. 07/04/18   Hudnall, Azucena Fallen, MD  ibuprofen (ADVIL,MOTRIN) 400 MG tablet Take 1 tablet (400 mg total) by mouth every 8 (eight) hours as needed for moderate pain. 06/24/18    Rancour, Jeannett Senior, MD  insulin NPH-regular (NOVOLIN 70/30) (70-30) 100 UNIT/ML injection Inject 13 Units into the skin 2 (two) times daily with a meal. Patient taking differently: Inject 10 Units into the skin daily with supper.  04/08/13   Dhungel, Theda Belfast, MD  nitroGLYCERIN (NITRODUR - DOSED IN MG/24 HR) 0.2 mg/hr patch Apply 1/4th patch to affected elbow/forearm, change daily 08/06/18   Hudnall, Azucena Fallen, MD  omeprazole (PRILOSEC) 40 MG capsule Take 40 mg by mouth daily.    [provider]  pantoprazole (PROTONIX) 20 MG tablet Take 1 tablet (20 mg total) by mouth daily. 05/26/17   Rolan Bucco, MD  promethazine (PHENERGAN) 25 MG tablet Take 1 tablet (25 mg total) by mouth every 6 (six) hours as needed for nausea or vomiting. 10/14/19   Tegeler, Canary Brim, MD    Allergies    Vancomycin and Adhesive [tape]  Review of Systems   Review of Systems  Constitutional: Negative for chills and fever.  HENT: Negative for sore throat.   Respiratory: Negative for shortness of breath.   Cardiovascular: Negative for chest pain.  Gastrointestinal: Positive for abdominal pain, blood in stool and diarrhea. Negative for nausea and vomiting.  Genitourinary: Negative for flank pain.  Musculoskeletal: Negative for back pain.  Skin: Negative for pallor and wound.  Neurological: Positive for light-headedness.  All other systems reviewed and are negative.   Physical Exam Updated Vital Signs BP (!) 111/57 (BP Location: Left Arm)   Pulse (!) 107   Temp 98.1 F (36.7 C) (Oral)   Resp 18   Ht 5\' 1"  (1.549 m)   Wt 131.5 kg   SpO2 98%   BMI 54.80 kg/m   Physical Exam Vitals and nursing note reviewed.  Constitutional:      Appearance: She is well-developed. She is obese.  HENT:     Head: Normocephalic and atraumatic.  Cardiovascular:     Rate and Rhythm: Normal rate.  Pulmonary:     Effort: Pulmonary effort is normal.     Comments: Difficult to auscultate due to body habitus.  Abdominal:      General: Abdomen is flat. Bowel sounds are normal.     Palpations: Abdomen is soft.     Tenderness: There is abdominal tenderness in the right upper quadrant and epigastric area. There is no right CVA tenderness, left CVA tenderness or guarding.  Genitourinary:    Rectum: Normal. No tenderness, external hemorrhoid or internal hemorrhoid.  Skin:    General: Skin is warm and dry.  Neurological:     Mental Status: She is alert and oriented to person, place, and time.     ED Results / Procedures / Treatments   Labs (all labs ordered are listed,  but only abnormal results are displayed) Labs Reviewed  CBC WITH DIFFERENTIAL/PLATELET - Abnormal; Notable for the following components:      Result Value   WBC 11.9 (*)    RBC 5.16 (*)    Neutro Abs 8.3 (*)    All other components within normal limits  COMPREHENSIVE METABOLIC PANEL - Abnormal; Notable for the following components:   Glucose, Bld 217 (*)    Calcium 8.7 (*)    Albumin 3.3 (*)    All other components within normal limits  OCCULT BLOOD X 1 CARD TO LAB, STOOL - Abnormal; Notable for the following components:   Fecal Occult Bld POSITIVE (*)    All other components within normal limits  LIPASE, BLOOD  POC OCCULT BLOOD, ED    EKG None  Radiology No results found.  Procedures Procedures (including critical care time)  Medications Ordered in ED Medications - No data to display  ED Course  I have reviewed the triage vital signs and the nursing notes.  Pertinent labs & imaging results that were available during my care of the patient were reviewed by me and considered in my medical decision making (see chart for details).  Clinical Course as of Oct 16 1725  Thu Oct 17, 2019  1718 Fecal Occult Blood, POC(!): POSITIVE [JS]    Clinical Course User Index [JS] Claude Manges, PA-C   MDM Rules/Calculators/A&P   Patient advised because her diabetes, hypertension presents to the ED for second visit in the past 4 days,  reports she has had diarrhea for the past week.  Evaluated 2 days ago had a negative CT scan, was sent home with Phenergan to help with symptomatic control.  She reports her diarrhea has not changed in nature aside from having now some blood in her stool.  She has felt lightheaded, now reporting right sided pain, this has been constant, reports it was very severe while she was at work causing her to double over.  No urinary symptoms, fever, sick contacts.  Past surgical history remarkable for cholecystectomy.  Prior CBC 2 days ago showed a white count of 11.6.  Today's visit leukocytosis is slightly elevated .3.  Hemoglobin remained stable at 14.2, will check Hemoccult for blood in her stool.  CMP without any electrolyte derangements, creatinine level is unremarkable, BUN is normal lower suspicion for dehydration at this time.  LFTs are within normal limits, lipase level is within normal limits.  She has a previous history of a cholecystectomy.  Extensive review of prior visit, CT abdomen did not reveal any diverticulitis, acute pathology 2 days ago.  Abdominal exam remains benign, she has not been running any fevers.  Rectal exam performed with Crystal RT at the bedside.  No gross blood on my exam. No hemorrhoids present. Hemoccult is positive.   Due to patient's symptoms have been ongoing for the past 4 days without improvement with now development of blood, I feel that is reasonable to place her on a short course of ciprofloxacin in order to cover her for empiric treatment.  Patient shows and agrees to management, she is to follow-up with PCP as needed.  Return precautions discussed at length.    Portions of this note were generated with Scientist, clinical (histocompatibility and immunogenetics). Dictation errors may occur despite best attempts at proofreading.  Final Clinical Impression(s) / ED Diagnoses Final diagnoses:  Abdominal pain, unspecified abdominal location    Rx / DC Orders ED Discharge Orders         Ordered  azithromycin (ZITHROMAX) 500 MG tablet  Daily,   Status:  Discontinued     Reprint     10/17/19 1724    ciprofloxacin (CIPRO) 500 MG tablet  2 times daily     Discontinue  Reprint     10/17/19 1725           Claude Manges, PA-C 10/17/19 1731    Little, Ambrose Finland, MD 10/21/19 [phone]

## 2019-10-17 NOTE — ED Triage Notes (Signed)
Pt c/o abd pain, diarrhea, HA, light headed-states she was seen here 6/28 for same and feels no better-NAD-steady gait

## 2019-10-17 NOTE — ED Notes (Signed)
This past Monday began having abd pain, was seen here in this ED, returns today stating she has a lot of diarrhea, denies any N/V. States abd pain is across RUQ and LUQ

## 2019-10-17 NOTE — Discharge Instructions (Addendum)
Your laboratory results were within normal limits today. Your rectal exam did reveal blood in your stool, I have prescribed antibiotics to help with your diarrhea, please take 1 tablet twice a day for the next 7 days.  Please follow-up with your primary care physician as needed.  May continue to drink plenty of fluids to help with your symptoms.

## 2020-01-18 ENCOUNTER — Encounter (HOSPITAL_BASED_OUTPATIENT_CLINIC_OR_DEPARTMENT_OTHER): Payer: Self-pay | Admitting: Emergency Medicine

## 2020-01-18 ENCOUNTER — Emergency Department (HOSPITAL_BASED_OUTPATIENT_CLINIC_OR_DEPARTMENT_OTHER)
Admission: EM | Admit: 2020-01-18 | Discharge: 2020-01-18 | Disposition: A | Discharge status: Home / Self Care | Payer: Self-pay | Attending: Emergency Medicine | Admitting: Emergency Medicine

## 2020-01-18 ENCOUNTER — Emergency Department (HOSPITAL_BASED_OUTPATIENT_CLINIC_OR_DEPARTMENT_OTHER): Payer: Self-pay

## 2020-01-18 ENCOUNTER — Other Ambulatory Visit: Payer: Self-pay

## 2020-01-18 DIAGNOSIS — S139XXA Sprain of joints and ligaments of unspecified parts of neck, initial encounter: Secondary | ICD-10-CM

## 2020-01-18 DIAGNOSIS — E119 Type 2 diabetes mellitus without complications: Secondary | ICD-10-CM | POA: Insufficient documentation

## 2020-01-18 DIAGNOSIS — Z7984 Long term (current) use of oral hypoglycemic drugs: Secondary | ICD-10-CM | POA: Insufficient documentation

## 2020-01-18 DIAGNOSIS — Z87891 Personal history of nicotine dependence: Secondary | ICD-10-CM | POA: Insufficient documentation

## 2020-01-18 DIAGNOSIS — Y990 Civilian activity done for income or pay: Secondary | ICD-10-CM | POA: Insufficient documentation

## 2020-01-18 DIAGNOSIS — W228XXA Striking against or struck by other objects, initial encounter: Secondary | ICD-10-CM | POA: Insufficient documentation

## 2020-01-18 DIAGNOSIS — S134XXA Sprain of ligaments of cervical spine, initial encounter: Secondary | ICD-10-CM | POA: Insufficient documentation

## 2020-01-18 MED ORDER — HYDROCODONE-ACETAMINOPHEN 5-325 MG PO TABS: 2.0000 | ORAL_TABLET | ORAL | 0 refills | Status: DC | PRN

## 2020-01-18 MED ORDER — METHOCARBAMOL 500 MG PO TABS
500.0000 mg | ORAL_TABLET | Freq: Once | ORAL | Status: AC
  Administered 2020-01-18: 500 mg via ORAL
  Filled 2020-01-18: qty 1

## 2020-01-18 MED ORDER — METHOCARBAMOL 500 MG PO TABS
500.0000 mg | ORAL_TABLET | Freq: Two times a day (BID) | ORAL | 0 refills | Status: DC
Start: 2020-01-18 — End: 2020-11-10

## 2020-01-18 MED ORDER — HYDROCODONE-ACETAMINOPHEN 5-325 MG PO TABS
1.0000 | ORAL_TABLET | Freq: Once | ORAL | Status: AC
  Administered 2020-01-18: 1 via ORAL
  Filled 2020-01-18: qty 1

## 2020-01-18 MED ORDER — LIDOCAINE 5 % EX PTCH: 1.0000 | MEDICATED_PATCH | CUTANEOUS | 0 refills | Status: DC

## 2020-01-18 MED ORDER — DICLOFENAC SODIUM 1 % EX GEL: 4.0000 g | Freq: Four times a day (QID) | CUTANEOUS | 0 refills | Status: DC
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
## 2020-01-18 NOTE — ED Notes (Signed)
AVS, DC instructions reviewed with client, discussed the 4 Rx's by the provider as well. Copy of AVS given to husband, opportunity for questions provided as well
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
## 2020-01-18 NOTE — Discharge Instructions (Signed)
Take the medications as prescribed  Return for new or worsening symptoms 

## 2020-05-06 ENCOUNTER — Emergency Department (HOSPITAL_BASED_OUTPATIENT_CLINIC_OR_DEPARTMENT_OTHER)
Admission: EM | Admit: 2020-05-06 | Discharge: 2020-05-06 | Disposition: A | Discharge status: Home / Self Care | Payer: 59 | Attending: Emergency Medicine | Admitting: Emergency Medicine

## 2020-05-06 ENCOUNTER — Other Ambulatory Visit: Payer: Self-pay

## 2020-05-06 ENCOUNTER — Encounter (HOSPITAL_BASED_OUTPATIENT_CLINIC_OR_DEPARTMENT_OTHER): Payer: Self-pay | Admitting: *Deleted

## 2020-05-06 DIAGNOSIS — U071 COVID-19: Secondary | ICD-10-CM | POA: Insufficient documentation

## 2020-05-06 DIAGNOSIS — E119 Type 2 diabetes mellitus without complications: Secondary | ICD-10-CM | POA: Insufficient documentation

## 2020-05-06 DIAGNOSIS — J069 Acute upper respiratory infection, unspecified: Secondary | ICD-10-CM | POA: Insufficient documentation

## 2020-05-06 DIAGNOSIS — Z794 Long term (current) use of insulin: Secondary | ICD-10-CM | POA: Diagnosis not present

## 2020-05-06 DIAGNOSIS — Z87891 Personal history of nicotine dependence: Secondary | ICD-10-CM | POA: Diagnosis not present

## 2020-05-06 DIAGNOSIS — R059 Cough, unspecified: Secondary | ICD-10-CM | POA: Diagnosis present

## 2020-05-06 NOTE — ED Triage Notes (Signed)
covid exposure with covid tx x 5 days , family at home covid +

## 2020-05-06 NOTE — ED Provider Notes (Signed)
[facility] EMERGENCY DEPARTMENT Provider Note   CSN: [id number] Arrival date & time: 05/06/20  1926     History Chief Complaint  Patient presents with  . Covid Exposure    [name] is a 50 y.o. female.  covid exposure with husband at home.   The history is provided by the patient.  URI Presenting symptoms: cough   Presenting symptoms: no ear pain, no fever and no sore throat   Cough:    Cough characteristics:  Non-productive   Sputum characteristics:  Nondescript   Severity:  Moderate   Onset quality:  Gradual   Duration:  5 days   Timing:  Intermittent   Progression:  Waxing and waning   Chronicity:  New Severity:  Mild Onset quality:  Gradual Timing:  Intermittent Associated symptoms: no arthralgias        Past Medical History:  Diagnosis Date  . Cellulitis 07/2011  . Diabetes mellitus    dx 2016  . GERD (gastroesophageal reflux disease)     Patient Active Problem List   Diagnosis Date Noted  . Cellulitis and abscess of right leg 01/02/2018  . Bilateral carpal tunnel syndrome 07/06/2016  . Left elbow pain 02/17/2016  . Tobacco abuse 10/29/2015  . Injury of right hand 05/19/2015  . Cervical disc disorder with radiculopathy of cervical region 10/10/2013  . Diabetes (HCC) 04/07/2013  . Pyelonephritis 04/06/2013  . RLQ abdominal pain 04/06/2013  . Fever 09/07/2012  . Headache(784.0) 09/07/2012  . Cellulitis 08/05/2011  . Peripheral edema 08/05/2011  . GERD (gastroesophageal reflux disease) 08/05/2011  . Obesity 08/05/2011    Past Surgical History:  Procedure Laterality Date  . ABLATION    . CARPAL TUNNEL RELEASE Left 09/13/2016   Procedure: LEFT CARPAL TUNNEL RELEASE;  Surgeon: Cindee Salt, MD;  Location: [facility] OR;  Service: Orthopedics;  Laterality: Left;  . CHOLECYSTECTOMY    . KNEE ARTHROSCOPY    . LAPAROSCOPIC TUBAL LIGATION  04/29/2011   Procedure: LAPAROSCOPIC TUBAL LIGATION;  Surgeon: Turner Daniels, MD;  Location: [facility] OR[facility];   Service: Gynecology;  Laterality: Bilateral;  with Filshie Clips  . ROTATOR CUFF REPAIR    . TUBAL LIGATION       OB History   No obstetric history on file.     Family History  Problem Relation Age of Onset  . Cancer Mother   . Diabetes Mother   . Hypertension Mother     Social History   Tobacco Use  . Smoking status: Former Smoker    Packs/day: 0.50    Years: 2.00    Pack years: 1.00    Types: Cigarettes    Quit date: 07/22/2011    Years since quitting: 8.7  . Smokeless tobacco: Never Used  Vaping Use  . Vaping Use: Never used  Substance Use Topics  . Alcohol use: Yes    Alcohol/week: 0.0 standard drinks    Comment: occ  . Drug use: No    Home Medications Prior to Admission medications   Medication Sig Start Date End Date Taking? Authorizing Provider  acetaminophen (TYLENOL) 500 MG tablet Take 1,000 mg by mouth every 6 (six) hours as needed for mild pain.    [provider]  diclofenac (VOLTAREN) 75 MG EC tablet Take 1 tablet (75 mg total) by mouth 2 (two) times daily. 07/04/18   Hudnall, Azucena Fallen, MD  diclofenac Sodium (VOLTAREN) 1 % GEL Apply 4 g topically 4 (four) times daily. 01/18/20   Henderly, Britni A, PA-C  HYDROcodone-acetaminophen (NORCO/VICODIN) 5-325 MG tablet Take 2 tablets by mouth every 4 (four) hours as needed. 01/18/20   Henderly, Britni A, PA-C  ibuprofen (ADVIL,MOTRIN) 400 MG tablet Take 1 tablet (400 mg total) by mouth every 8 (eight) hours as needed for moderate pain. 06/24/18   Rancour, Jeannett Senior, MD  insulin NPH-regular (NOVOLIN 70/30) (70-30) 100 UNIT/ML injection Inject 13 Units into the skin 2 (two) times daily with a meal. Patient taking differently: Inject 10 Units into the skin daily with supper.  04/08/13   Dhungel, Nishant, MD  lidocaine (LIDODERM) 5 % Place 1 patch onto the skin daily. Remove & Discard patch within 12 hours or as directed by MD 01/18/20   Henderly, Britni A, PA-C  methocarbamol (ROBAXIN) 500 MG tablet Take 1 tablet (500  mg total) by mouth 2 (two) times daily. 01/18/20   Henderly, Britni A, PA-C  nitroGLYCERIN (NITRODUR - DOSED IN MG/24 HR) 0.2 mg/hr patch Apply 1/4th patch to affected elbow/forearm, change daily 08/06/18   Hudnall, Azucena Fallen, MD  omeprazole (PRILOSEC) 40 MG capsule Take 40 mg by mouth daily.    [provider]  pantoprazole (PROTONIX) 20 MG tablet Take 1 tablet (20 mg total) by mouth daily. 05/26/17   Rolan Bucco, MD  promethazine (PHENERGAN) 25 MG tablet Take 1 tablet (25 mg total) by mouth every 6 (six) hours as needed for nausea or vomiting. 10/14/19   Tegeler, Canary Brim, MD    Allergies    Vancomycin and Adhesive [tape]  Review of Systems   Review of Systems  Constitutional: Negative for chills and fever.  HENT: Negative for ear pain and sore throat.   Eyes: Negative for pain and visual disturbance.  Respiratory: Positive for cough. Negative for shortness of breath.   Cardiovascular: Negative for chest pain and palpitations.  Gastrointestinal: Negative for abdominal pain and vomiting.  Genitourinary: Negative for dysuria and hematuria.  Musculoskeletal: Negative for arthralgias and back pain.  Skin: Negative for color change and rash.  Neurological: Negative for seizures and syncope.  All other systems reviewed and are negative.   Physical Exam Updated Vital Signs BP 121/80   Pulse 88   Temp 98.7 F (37.1 C) (Oral)   Resp 18   Ht 5\' 1"  (1.549 m)   Wt (!) 149.7 kg   SpO2 98%   BMI 62.35 kg/m   Physical Exam Vitals and nursing note reviewed.  Constitutional:      General: She is not in acute distress.    Appearance: She is well-developed and well-nourished.  HENT:     Head: Normocephalic and atraumatic.  Eyes:     Extraocular Movements: Extraocular movements intact.     Conjunctiva/sclera: Conjunctivae normal.  Cardiovascular:     Rate and Rhythm: Normal rate and regular rhythm.     Pulses: Normal pulses.     Heart sounds: Normal heart sounds. No murmur  heard.   Pulmonary:     Effort: Pulmonary effort is normal. No respiratory distress.     Breath sounds: Normal breath sounds.  Abdominal:     Palpations: Abdomen is soft.     Tenderness: There is no abdominal tenderness.  Musculoskeletal:        General: No edema.     Cervical back: Neck supple.  Skin:    General: Skin is warm and dry.  Neurological:     Mental Status: She is alert.  Psychiatric:        Mood and Affect: Mood and affect normal.  ED Results / Procedures / Treatments   Labs (all labs ordered are listed, but only abnormal results are displayed) Labs Reviewed  SARS CORONAVIRUS 2 (TAT 6-24 HRS)    EKG None  Radiology No results found.  Procedures Procedures (including critical care time)  Medications Ordered in ED Medications - No data to display  ED Course  I have reviewed the triage vital signs and the nursing notes.  Pertinent labs & imaging results that were available during my care of the patient were reviewed by me and considered in my medical decision making (see chart for details).    MDM Rules/Calculators/A&P                          [name] is here for COVID testing.  COVID exposure at home.  Normal vitals.  No fever.  Mild symptoms.  No respiratory distress.  Normal room air oxygenation both at rest and with ambulation.  History of diabetes.  We will refer her to ambulatory COVID clinic.  Discharged in good condition.  Understands return precautions.  This chart was dictated using voice recognition software.  Despite best efforts to proofread,  errors can occur which can change the documentation meaning.  [name] was evaluated in Emergency Department on 05/06/2020 for the symptoms described in the history of present illness. She was evaluated in the context of the global COVID-19 pandemic, which necessitated consideration that the patient might be at risk for infection with the SARS-CoV-2 virus that causes COVID-19.  Institutional protocols and algorithms that pertain to the evaluation of patients at risk for COVID-19 are in a state of rapid change based on information released by regulatory bodies including the CDC and federal and state organizations. These policies and algorithms were followed during the patient's care in the ED.   Final Clinical Impression(s) / ED Diagnoses Final diagnoses:  Upper respiratory tract infection, unspecified type    Rx / DC Orders ED Discharge Orders         Ordered    Ambulatory referral for Covid Treatment        05/06/20 2032           Virgina Norfolk, DO 05/06/20 2033

## 2020-05-07 ENCOUNTER — Telehealth: Payer: Self-pay

## 2020-05-07 LAB — SARS CORONAVIRUS 2 (TAT 6-24 HRS): SARS Coronavirus 2: POSITIVE — AB

## 2020-05-07 NOTE — Telephone Encounter (Signed)
Called to discuss with patient about COVID-19 symptoms and the use of one of the available treatments for those with mild to moderate Covid symptoms and at a high risk of hospitalization.  Pt appears to qualify for outpatient treatment due to co-morbid conditions and/or a member of an at-risk group in accordance with the FDA Emergency Use Authorization.    Symptom onset: 05/03/20 Vaccinated: No Booster? No Immunocompromised? No Qualifiers: DM, Obesity  Unable to reach pt - Reached pt.   Esther Hardy  Pt.'s husband currently hospitalized with COVID 19.

## 2020-06-27 ENCOUNTER — Encounter: Payer: Self-pay | Admitting: Emergency Medicine

## 2020-06-27 ENCOUNTER — Other Ambulatory Visit: Payer: Self-pay

## 2020-06-27 ENCOUNTER — Ambulatory Visit (INDEPENDENT_AMBULATORY_CARE_PROVIDER_SITE_OTHER): Payer: 59

## 2020-06-27 ENCOUNTER — Ambulatory Visit
Admission: EM | Admit: 2020-06-27 | Discharge: 2020-06-27 | Disposition: A | Discharge status: Home / Self Care | Payer: 59 | Attending: Family Medicine | Admitting: Family Medicine

## 2020-06-27 DIAGNOSIS — M25512 Pain in left shoulder: Secondary | ICD-10-CM

## 2020-06-27 MED ORDER — DICLOFENAC SODIUM 75 MG PO TBEC
75.0000 mg | DELAYED_RELEASE_TABLET | Freq: Two times a day (BID) | ORAL | 0 refills | Status: DC
Start: 2020-06-27 — End: 2020-11-10

## 2020-06-27 MED ORDER — PREDNISONE 20 MG PO TABS
40.0000 mg | ORAL_TABLET | Freq: Every day | ORAL | 0 refills | Status: DC
Start: 2020-06-27 — End: 2020-11-10

## 2020-06-27 NOTE — ED Triage Notes (Addendum)
Left shoulder pain for a week.  Pain has started to worsen since Wednesday .  Pain is just below shoulder today.describes pain as sharp, sometimes throbbing.  And sometimes feels like someone pinching her on top of left shoulder.  Fingers on left hand numb intermittently since onset of pain in left shoulder.  Pain is palpable.  Patient is left handed.  Patient works in [employer].   Has had this shoulder operated on before-torn rotator cuff

## 2020-06-27 NOTE — Discharge Instructions (Addendum)
Watch your blood sugars carefully while taking prednisone. They will increase.

## 2020-06-29 NOTE — ED Provider Notes (Signed)
[facility]   [id number] 06/27/20 Arrival Time: 1200  ASSESSMENT & PLAN:  1. Acute pain of left shoulder     I have personally viewed the imaging studies ordered this visit. No fractures appreciated. See radiology report below. Discussed with pt.  Begin trial of: Meds ordered this encounter  Medications  . diclofenac (VOLTAREN) 75 MG EC tablet    Sig: Take 1 tablet (75 mg total) by mouth 2 (two) times daily.    Dispense:  14 tablet    Refill:  0  . predniSONE (DELTASONE) 20 MG tablet    Sig: Take 2 tablets (40 mg total) by mouth daily.    Dispense:  10 tablet    Refill:  0    Orders Placed This Encounter  Procedures  . DG Shoulder Left    Recommend:  Follow-up Information    Schedule an appointment as soon as possible for a visit  with August Saucer Corrie Mckusick, MD.   Specialty: Orthopedic Surgery Contact information: [address] [place] [address] [phone]               Reviewed expectations re: course of current medical issues. Questions answered. Outlined signs and symptoms indicating need for more acute intervention. Patient verbalized understanding. After Visit Summary given.  SUBJECTIVE: History from: patient. [name] is a 50 y.o. female who reports intermittent L shoulder pain; non-traumatic; throbbing at times, esp at night; "pinch feeling". No extremity sensation changes or weakness. Works [employer]; ques relation. OTC analgesics without much relief.   Past Surgical History:  Procedure Laterality Date  . ABLATION    . CARPAL TUNNEL RELEASE Left 09/13/2016   Procedure: LEFT CARPAL TUNNEL RELEASE;  Surgeon: Cindee Salt, MD;  Location: [facility] OR;  Service: Orthopedics;  Laterality: Left;  . CHOLECYSTECTOMY    . KNEE ARTHROSCOPY    . LAPAROSCOPIC TUBAL LIGATION  04/29/2011   Procedure: LAPAROSCOPIC TUBAL LIGATION;  Surgeon: Turner Daniels, MD;  Location: [facility];  Service: Gynecology;  Laterality: Bilateral;  with Filshie Clips   . ROTATOR CUFF REPAIR    . TUBAL LIGATION        OBJECTIVE:  Vitals:   06/27/20 1213  BP: 123/81  Pulse: 100  Resp: 20  Temp: 98.3 F (36.8 C)  TempSrc: Oral  SpO2: 97%    General appearance: alert; no distress HEENT: [place]; AT Neck: supple with FROM Resp: unlabored respirations Extremities: . LUE: warm with well perfused appearance; poorly localized mild to moderate tenderness over left post shoudler; without gross deformities; swelling: none; bruising: none; shoulder ROM: normal, with discomfort CV: brisk extremity capillary refill of LUE; 2+ radial pulse of LUE. Skin: warm and dry; no visible rashes Neurologic: gait normal; normal sensation and strength of LUE Psychological: alert and cooperative; normal mood and affect  Imaging: DG Shoulder Left  Result Date: 06/27/2020 CLINICAL DATA:  Left shoulder pain for 1 week EXAM: LEFT SHOULDER - 2+ VIEW COMPARISON:  01/29/2014 FINDINGS: No evidence of acute fracture or dislocation. Glenohumeral and acromioclavicular joints are within normal limits. Multiple ovoid mineralized densities adjacent to the greater tuberosity of the left humerus suggestive of hydroxyapatite deposition within the rotator cuff. Largest deposit measures approximately 18 x 14 mm. IMPRESSION: 1. No acute fracture or dislocation of the left shoulder. 2. 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 Largest deposit measures approximately 18 x 14 mm. Electronically Signed   By: Duanne Guess D.O.   On: 06/27/2020 13:42  Allergies  Allergen Reactions  . Vancomycin Other (See Comments)    Red man syndrome  . Adhesive [Tape] Itching and Rash    Past Medical History:  Diagnosis Date  . Cellulitis 07/2011  . Diabetes mellitus    dx 2016  . GERD (gastroesophageal reflux disease)    Social History   Socioeconomic History  . Marital status: Married    Spouse name: Not on file  . Number of children: Not on file  . Years of  education: Not on file  . Highest education level: Not on file  Occupational History  . Not on file  Tobacco Use  . Smoking status: Former Smoker    Packs/day: 0.50    Years: 2.00    Pack years: 1.00    Types: Cigarettes    Quit date: 07/22/2011    Years since quitting: 8.9  . Smokeless tobacco: Never Used  Vaping Use  . Vaping Use: Never used  Substance and Sexual Activity  . Alcohol use: Yes    Alcohol/week: 0.0 standard drinks    Comment: occ  . Drug use: No  . Sexual activity: Not on file  Other Topics Concern  . Not on file  Social History Narrative  . Not on file   Social Determinants of Health   Financial Resource Strain: Not on file  Food Insecurity: Not on file  Transportation Needs: Not on file  Physical Activity: Not on file  Stress: Not on file  Social Connections: Not on file   Family History  Problem Relation Age of Onset  . Cancer Mother   . Diabetes Mother   . Hypertension Mother    Past Surgical History:  Procedure Laterality Date  . ABLATION    . CARPAL TUNNEL RELEASE Left 09/13/2016   Procedure: LEFT CARPAL TUNNEL RELEASE;  Surgeon: Cindee Salt, MD;  Location: [facility];  Service: Orthopedics;  Laterality: Left;  . CHOLECYSTECTOMY    . KNEE ARTHROSCOPY    . LAPAROSCOPIC TUBAL LIGATION  04/29/2011   Procedure: LAPAROSCOPIC TUBAL LIGATION;  Surgeon: Turner Daniels, MD;  Location: [facility];  Service: Gynecology;  Laterality: Bilateral;  with Filshie Clips  . ROTATOR CUFF REPAIR    . TUBAL LIGATION        Mardella Layman, MD 06/29/20 [phone]

## 2020-07-10 ENCOUNTER — Ambulatory Visit: Payer: 59 | Admitting: Surgical

## 2020-07-11 ENCOUNTER — Encounter (HOSPITAL_BASED_OUTPATIENT_CLINIC_OR_DEPARTMENT_OTHER): Payer: Self-pay | Admitting: *Deleted

## 2020-07-11 ENCOUNTER — Other Ambulatory Visit: Payer: Self-pay

## 2020-07-11 ENCOUNTER — Emergency Department (HOSPITAL_BASED_OUTPATIENT_CLINIC_OR_DEPARTMENT_OTHER)
Admission: EM | Admit: 2020-07-11 | Discharge: 2020-07-11 | Disposition: A | Discharge status: Home / Self Care | Payer: 59 | Attending: Emergency Medicine | Admitting: Emergency Medicine

## 2020-07-11 DIAGNOSIS — Z794 Long term (current) use of insulin: Secondary | ICD-10-CM | POA: Insufficient documentation

## 2020-07-11 DIAGNOSIS — Z87891 Personal history of nicotine dependence: Secondary | ICD-10-CM | POA: Insufficient documentation

## 2020-07-11 DIAGNOSIS — M25512 Pain in left shoulder: Secondary | ICD-10-CM | POA: Diagnosis present

## 2020-07-11 DIAGNOSIS — E119 Type 2 diabetes mellitus without complications: Secondary | ICD-10-CM | POA: Diagnosis not present

## 2020-07-11 NOTE — ED Provider Notes (Signed)
[facility] EMERGENCY DEPARTMENT Provider Note   CSN: [id number] Arrival date & time: 07/11/20  1906     History Chief Complaint  Patient presents with  . Shoulder Pain    Brooke DurandJennifer Peterson is a 50 y.o. female history of obesity, diabetes, GERD, peripheral edema.  Patient presents to the ER today for left shoulder pain onset 2-3 weeks ago.  She reports it has been causing her increased pain for the past 2 weeks denies any clear injury.  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  Patient reports pain is moderate intensity, sharp only with certain movements and improves with rest and holding her arm against her abdomen, pain does not radiate.  Patient reports that she went to an urgent care 2 weeks ago and was told she had deposits in her left shoulder she was referred to orthopedist and has a follow-up appointment in 2 weeks.  She reports that she had begun taking the prednisone that was prescribed to her but feels that it did not agree with her so she stopped taking it.  Patient reports that she try to go back to work but due to her left shoulder pain does not feel that she can perform her job so she is requesting a sling and a work note today.   Denies fever/chills, fall/injury, chest pain/shortness of breath, pleurisy, exertional pain, neck pain or any additional concerns  HPI     Past Medical History:  Diagnosis Date  . Cellulitis 07/2011  . Diabetes mellitus    dx 2016  . GERD (gastroesophageal reflux disease)     Patient Active Problem List   Diagnosis Date Noted  . Cellulitis and abscess of right leg 01/02/2018  . Bilateral carpal tunnel syndrome 07/06/2016  . Left elbow pain 02/17/2016  . Tobacco abuse 10/29/2015  . Injury of right hand 05/19/2015  . Cervical disc disorder with radiculopathy of cervical region 10/10/2013  . Diabetes (HCC) 04/07/2013  . Pyelonephritis 04/06/2013  . RLQ abdominal pain 04/06/2013   . Fever 09/07/2012  . Headache(784.0) 09/07/2012  . Cellulitis 08/05/2011  . Peripheral edema 08/05/2011  . GERD (gastroesophageal reflux disease) 08/05/2011  . Obesity 08/05/2011    Past Surgical History:  Procedure Laterality Date  . ABLATION    . CARPAL TUNNEL RELEASE Left 09/13/2016   Procedure: LEFT CARPAL TUNNEL RELEASE;  Surgeon: Cindee SaltKuzma, Gary, MD;  Location: [facility] OR;  Service: Orthopedics;  Laterality: Left;  . CHOLECYSTECTOMY    . KNEE ARTHROSCOPY    . LAPAROSCOPIC TUBAL LIGATION  04/29/2011   Procedure: LAPAROSCOPIC TUBAL LIGATION;  Surgeon: Turner Danielsavid C Lowe, MD;  Location: [facility];  Service: Gynecology;  Laterality: Bilateral;  with Filshie Clips  . ROTATOR CUFF REPAIR    . TUBAL LIGATION       OB History   No obstetric history on file.     Family History  Problem Relation Age of Onset  . Cancer Mother   . Diabetes Mother   . Hypertension Mother     Social History   Tobacco Use  . Smoking status: Former Smoker    Packs/day: 0.50    Years: 2.00    Pack years: 1.00    Types: Cigarettes    Quit date: 07/22/2011    Years since quitting: 8.9  . Smokeless tobacco: Never Used  Vaping Use  . Vaping Use: Never used  Substance Use Topics  . Alcohol use: Yes  Alcohol/week: 0.0 standard drinks    Comment: occ  . Drug use: No    Home Medications Prior to Admission medications   Medication Sig Start Date End Date Taking? Authorizing Provider  acetaminophen (TYLENOL) 500 MG tablet Take 1,000 mg by mouth every 6 (six) hours as needed for mild pain.    [provider]  diclofenac (VOLTAREN) 75 MG EC tablet Take 1 tablet (75 mg total) by mouth 2 (two) times daily. 06/27/20   Mardella Layman, MD  HYDROcodone-acetaminophen (NORCO/VICODIN) 5-325 MG tablet Take 2 tablets by mouth every 4 (four) hours as needed. 01/18/20   Henderly, Britni A, PA-C  insulin NPH-regular (NOVOLIN 70/30) (70-30) 100 UNIT/ML injection Inject 13 Units into the skin 2 (two) times daily with a  meal. Patient taking differently: Inject 10 Units into the skin daily with supper. 04/08/13   Dhungel, Nishant, MD  lidocaine (LIDODERM) 5 % Place 1 patch onto the skin daily. Remove & Discard patch within 12 hours or as directed by MD 01/18/20   Henderly, Britni A, PA-C  methocarbamol (ROBAXIN) 500 MG tablet Take 1 tablet (500 mg total) by mouth 2 (two) times daily. 01/18/20   Henderly, Britni A, PA-C  nitroGLYCERIN (NITRODUR - DOSED IN MG/24 HR) 0.2 mg/hr patch Apply 1/4th patch to affected elbow/forearm, change daily 08/06/18   Hudnall, Azucena Fallen, MD  omeprazole (PRILOSEC) 40 MG capsule Take 40 mg by mouth daily.    [provider]  predniSONE (DELTASONE) 20 MG tablet Take 2 tablets (40 mg total) by mouth daily. 06/27/20   Mardella Layman, MD  promethazine (PHENERGAN) 25 MG tablet Take 1 tablet (25 mg total) by mouth every 6 (six) hours as needed for nausea or vomiting. 10/14/19   Tegeler, Canary Brim, MD  pantoprazole (PROTONIX) 20 MG tablet Take 1 tablet (20 mg total) by mouth daily. 05/26/17 06/27/20  Rolan Bucco, MD    Allergies    Diclofenac, Vancomycin, and Adhesive [tape]  Review of Systems   Review of Systems  Constitutional: Negative.  Negative for chills and fever.  Respiratory: Negative.  Negative for shortness of breath.   Cardiovascular: Negative.  Negative for chest pain.  Musculoskeletal: Positive for arthralgias (Left shoulder). Negative for back pain and neck pain.  Neurological: Negative.  Negative for weakness.    Physical Exam Updated Vital Signs BP 130/87 (BP Location: Right Arm)   Pulse 96   Temp 98.2 F (36.8 C) (Oral)   Resp 18   Ht 5\' 1"  (1.549 m)   Wt 132.5 kg   SpO2 100%   BMI 55.17 kg/m   Physical Exam Constitutional:      General: She is not in acute distress.    Appearance: Normal appearance. She is well-developed. She is not ill-appearing or diaphoretic.  HENT:     Head: Normocephalic and atraumatic.  Eyes:     General: Vision grossly  intact. Gaze aligned appropriately.     Pupils: Pupils are equal, round, and reactive to light.  Neck:     Trachea: Trachea and phonation normal.  Cardiovascular:     Rate and Rhythm: Normal rate and regular rhythm.     Pulses:          Radial pulses are 2+ on the right side and 2+ on the left side.  Pulmonary:     Effort: Pulmonary effort is normal. No respiratory distress.  Abdominal:     General: There is no distension.     Palpations: Abdomen is soft.  Tenderness: There is no abdominal tenderness. There is no guarding or rebound.  Musculoskeletal:        General: Normal range of motion.     Cervical back: Normal range of motion.     Comments: Cervical Spine: Appearance normal. No obvious bony deformity. No skin swelling, erythema, heat, fluctuance or break of the skin. No TTP over the cervical spinous processes. No paraspinal tenderness. No step-offs. Patient is able to actively rotate their neck 45 degrees left and right voluntarily without pain and flex and extend the neck without pain.  ------- Left Shoulder: Appearance normal. No obvious bony deformity. No skin swelling, erythema, heat, fluctuance or break of the skin. No clavicular deformity or TTP. TTP over anterior and lateral deltoid without overlying skin change.  Decreased range of motion above shoulder level secondary to pain.  Decreased range of motion with external rotation secondary to pain.  No pain with internal rotation. ----- Left Elbow: Appearance normal. No obvious bony deformity. No skin swelling, erythema, heat, fluctuance or break of the skin. No TTP over joint. Active flexion, extension, supination and pronation full and intact without pain. Strength able and appropriate for age for flexion and extension. Radial Pulse 2+. Cap refill <2 seconds. SILT for M/U/R distributions. Compartments soft.   Skin:    General: Skin is warm and dry.  Neurological:     Mental Status: She is alert.     GCS: GCS eye subscore is  4. GCS verbal subscore is 5. GCS motor subscore is 6.     Comments: Speech is clear and goal oriented, follows commands Major Cranial nerves without deficit, no facial droop Moves extremities without ataxia, coordination intact  Psychiatric:        Behavior: Behavior normal.     ED Results / Procedures / Treatments   Labs (all labs ordered are listed, but only abnormal results are displayed) Labs Reviewed - No data to display  EKG None  Radiology No results found.  Procedures Procedures   Medications Ordered in ED Medications - No data to display  ED Course  I have reviewed the triage vital signs and the nursing notes.  Pertinent labs & imaging results that were available during my care of the patient were reviewed by me and considered in my medical decision making (see chart for details).    MDM Rules/Calculators/A&P                         Additional history obtained from: 1. Nursing notes from this visit. 2. Review of electronic medical records.  Patient seen at urgent care 06/27/2020, diagnosis of acute pain of left shoulder.  Patient had left shoulder x-ray, radiologist interpretation below.  IMPRESSION: 1. No acute fracture or dislocation of the left shoulder. 2. 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 Largest deposit measures approximately 18 x 14 mm.  I personally reviewed patient's x-ray from previous visit and agree with radiologist interpretation above.  Patient was prescribed prednisone during that visit. ----------- 50 year old female presented to the ER with her main request being a work note and a sling for her left arm.  She reports that she has a follow-up with orthopedist Dr. August Saucer in 2 weeks for further evaluation of her left shoulder pain.  On exam her pain is consistently reproducible with overhead movements and external rotation at the shoulder.  She is neurovascularly intact distally.  Patient's previous x-ray with  deposits along the rotator cuff  for supportive of musculoskeletal cause of left shoulder pain.  Doubt atypical chest pain or other emergent causes of left shoulder pain today.  Additionally no evidence for cellulitis, septic arthritis, DVT, compartment syndrome, neurovascular compromise or other emergent pathologies.  Patient was given sling and work note during this visit she will continue rice therapy and follow-up with Dr. August Saucer.  At this time there does not appear to be any evidence of an acute emergency medical condition and the patient appears stable for discharge with appropriate outpatient follow up. Diagnosis was discussed with patient who verbalizes understanding of care plan and is agreeable to discharge. I have discussed return precautions with patient who verbalizes understanding. Patient encouraged to follow-up with their PCP orthopedist. All questions answered.  Patient's case discussed with Dr. Stevie Kern who agrees with plan to discharge with follow-up.   Note: Portions of this report may have been transcribed using voice recognition software. Every effort was made to ensure accuracy; however, inadvertent computerized transcription errors may still be present. Final Clinical Impression(s) / ED Diagnoses Final diagnoses:  Acute pain of left shoulder    Rx / DC Orders ED Discharge Orders    None       Elizabeth Palau 07/11/20 2157    Milagros Loll, MD 07/11/20 2321

## 2020-07-11 NOTE — ED Triage Notes (Signed)
Left shoulder pain x 3 weeks. She has been seen in ED for same and has appt on April 8th. She is here due to increased pain tonight

## 2020-07-11 NOTE — Discharge Instructions (Signed)
At this time there does not appear to be the presence of an emergent medical condition, however there is always the potential for conditions to change. Please read and follow the below instructions.  Please return to the Emergency Department immediately for any new or worsening symptoms. Please be sure to follow up with your Primary Care Provider within one week regarding your visit today; please call their office to schedule an appointment even if you are feeling better for a follow-up visit. Please continue to use your sling to protect your arm from further injury.  Please see the orthopedist Dr. August Saucer for follow-up visit for further evaluation and treatment of your left shoulder pain.  Go to the nearest Emergency Department immediately if: You have fever or chills Your arm, hand, or fingers: Tingle. Are numb. Are swollen. Are painful. Turn white or blue. You have chest pain or trouble breathing You have any new/concerning or worsening of symptoms   Please read the additional information packets attached to your discharge summary.  Do not take your medicine if  develop an itchy rash, swelling in your mouth or lips, or difficulty breathing; call 911 and seek immediate emergency medical attention if this occurs.  You may review your lab tests and imaging results in their entirety on your MyChart account.  Please discuss all results of fully with your primary care provider and other specialist at your follow-up visit.  Note: Portions of this text may have been transcribed using voice recognition software. Every effort was made to ensure accuracy; however, inadvertent computerized transcription errors may still be present.

## 2020-07-15 ENCOUNTER — Encounter: Payer: Self-pay | Admitting: Internal Medicine

## 2020-07-24 ENCOUNTER — Encounter: Payer: Self-pay | Admitting: Orthopedic Surgery

## 2020-07-24 ENCOUNTER — Other Ambulatory Visit: Payer: Self-pay

## 2020-07-24 ENCOUNTER — Ambulatory Visit (INDEPENDENT_AMBULATORY_CARE_PROVIDER_SITE_OTHER): Payer: 59 | Admitting: Orthopedic Surgery

## 2020-07-24 DIAGNOSIS — M7532 Calcific tendinitis of left shoulder: Secondary | ICD-10-CM | POA: Diagnosis not present

## 2020-07-24 MED ORDER — BUPIVACAINE HCL 0.5 % IJ SOLN
9.0000 mL | INTRAMUSCULAR | Status: AC | PRN
  Administered 2020-07-24: 9 mL via INTRA_ARTICULAR

## 2020-07-24 MED ORDER — LIDOCAINE HCL 1 % IJ SOLN
5.0000 mL | INTRAMUSCULAR | Status: AC | PRN
  Administered 2020-07-24: 5 mL

## 2020-07-24 MED ORDER — BETAMETHASONE SOD PHOS & ACET 6 (3-3) MG/ML IJ SUSP
6.0000 mg | INTRAMUSCULAR | Status: AC | PRN
  Administered 2020-07-24: 6 mg via INTRA_ARTICULAR

## 2020-07-24 NOTE — Progress Notes (Signed)
Office Visit Note   Patient: [name]           Date of Birth: 06/27/1970           MRN: [id number] Visit Date: 07/24/2020 Requested by: No referring provider defined for this encounter. PCP: Patient, No Pcp Per (Inactive)  Subjective: Chief Complaint  Patient presents with  . Left Shoulder - Pain    HPI: [name] is a 50 year old patient with 3-week history of atraumatic onset left shoulder pain.  The pain wakes her from sleep at night.  She is left-hand dominant.  She works at [employer].  Reports decreased range of motion due to the pain.  She has been taking Tylenol and ibuprofen.  Describes some occasional numbness and tingling in her fingers and neck pain but most of the pain localized to her shoulder.  Outside radiographs from the emergency department do show calcific tendinitis superiorly.  States that her shoulder "popped" about 4 weeks ago but that was without any history of trauma.              ROS: All systems reviewed are negative as they relate to the chief complaint within the history of present illness.  Patient denies  fevers or chills.   Assessment & Plan: Visit Diagnoses:  1. Calcific tendinitis of left shoulder     Plan: Impression is calcific tendinitis left shoulder with good rotator cuff strength on exam and no limitation of passive external rotation.  Plan is subacromial injection with 6-week return and continuing to work on range of motion so that her shoulder does not stiffen up.  Will consider repeat injection at that time possibly ultrasound-guided to try to break up some of that calcium if needed.  First things first though we will do the subacromial injection today.  Follow-Up Instructions: Return in about 6 weeks (around 09/04/2020).   Orders:  No orders of the defined types were placed in this encounter.  No orders of the defined types were placed in this encounter.     Procedures: Large Joint Inj: L subacromial bursa on 07/24/2020 3:54  PM Indications: diagnostic evaluation and pain Details: 18 G 1.5 in needle, posterior approach  Arthrogram: No  Medications: 9 mL bupivacaine 0.5 %; 5 mL lidocaine 1 %; 6 mg betamethasone acetate-betamethasone sodium phosphate 6 (3-3) MG/ML Outcome: tolerated well, no immediate complications Procedure, treatment alternatives, risks and benefits explained, specific risks discussed. Consent was given by the patient. Immediately prior to procedure a time out was called to verify the correct patient, procedure, equipment, support staff and site/side marked as required. Patient was prepped and draped in the usual sterile fashion.       Clinical Data: No additional findings.  Objective: Vital Signs: There were no vitals taken for this visit.  Physical Exam:   Constitutional: Patient appears well-developed HEENT:  Head: Normocephalic Eyes:EOM are normal Neck: Normal range of motion Cardiovascular: Normal rate Pulmonary/chest: Effort normal Neurologic: Patient is alert Skin: Skin is warm Psychiatric: Patient has normal mood and affect    Ortho Exam: Ortho exam demonstrates good cervical spine range of motion.  Negative Tinel's cubital tunnel with no subluxation of the ulnar nerve.  5 out of 5 grip EPL FPL interosseous wrist flexion extension bicep triceps and deltoid strength.  Neck range of motion is full.  Left shoulder is examined.  No discrete AC joint tenderness is present.  No warmth to the shoulder region.  Rotator cuff strength is intact infraspinatus supraspinatus subscap muscle testing.  No coarse grinding or crepitus with active or passive range of motion of the shoulder.  Passive shoulder range of motion is 50/90/165.  Specialty Comments:  No specialty comments available.  Imaging: No results found.   PMFS History: Patient Active Problem List   Diagnosis Date Noted  . Cellulitis and abscess of right leg 01/02/2018  . Bilateral carpal tunnel syndrome 07/06/2016  .  Left elbow pain 02/17/2016  . Tobacco abuse 10/29/2015  . Injury of right hand 05/19/2015  . Cervical disc disorder with radiculopathy of cervical region 10/10/2013  . Diabetes (HCC) 04/07/2013  . Pyelonephritis 04/06/2013  . RLQ abdominal pain 04/06/2013  . Fever 09/07/2012  . Headache(784.0) 09/07/2012  . Cellulitis 08/05/2011  . Peripheral edema 08/05/2011  . GERD (gastroesophageal reflux disease) 08/05/2011  . Obesity 08/05/2011   Past Medical History:  Diagnosis Date  . Cellulitis 07/2011  . Diabetes mellitus    dx 2016  . GERD (gastroesophageal reflux disease)     Family History  Problem Relation Age of Onset  . Cancer Mother   . Diabetes Mother   . Hypertension Mother     Past Surgical History:  Procedure Laterality Date  . ABLATION    . CARPAL TUNNEL RELEASE Left 09/13/2016   Procedure: LEFT CARPAL TUNNEL RELEASE;  Surgeon: Cindee Salt, MD;  Location: [facility] OR;  Service: Orthopedics;  Laterality: Left;  . CHOLECYSTECTOMY    . KNEE ARTHROSCOPY    . LAPAROSCOPIC TUBAL LIGATION  04/29/2011   Procedure: LAPAROSCOPIC TUBAL LIGATION;  Surgeon: Turner Daniels, MD;  Location: [facility];  Service: Gynecology;  Laterality: Bilateral;  with Filshie Clips  . ROTATOR CUFF REPAIR    . TUBAL LIGATION     Social History   Occupational History  . Not on file  Tobacco Use  . Smoking status: Former Smoker    Packs/day: 0.50    Years: 2.00    Pack years: 1.00    Types: Cigarettes    Quit date: 07/22/2011    Years since quitting: 9.0  . Smokeless tobacco: Never Used  Vaping Use  . Vaping Use: Never used  Substance and Sexual Activity  . Alcohol use: Yes    Alcohol/week: 0.0 standard drinks    Comment: occ  . Drug use: No  . Sexual activity: Not on file

## 2020-07-29 ENCOUNTER — Ambulatory Visit: Payer: 59 | Admitting: Internal Medicine

## 2020-07-31 ENCOUNTER — Emergency Department (HOSPITAL_BASED_OUTPATIENT_CLINIC_OR_DEPARTMENT_OTHER)
Admission: EM | Admit: 2020-07-31 | Discharge: 2020-07-31 | Disposition: A | Discharge status: Home / Self Care | Payer: 59 | Attending: Emergency Medicine | Admitting: Emergency Medicine

## 2020-07-31 ENCOUNTER — Other Ambulatory Visit: Payer: Self-pay

## 2020-07-31 ENCOUNTER — Encounter (HOSPITAL_BASED_OUTPATIENT_CLINIC_OR_DEPARTMENT_OTHER): Payer: Self-pay | Admitting: Emergency Medicine

## 2020-07-31 DIAGNOSIS — E119 Type 2 diabetes mellitus without complications: Secondary | ICD-10-CM | POA: Insufficient documentation

## 2020-07-31 DIAGNOSIS — Z794 Long term (current) use of insulin: Secondary | ICD-10-CM | POA: Diagnosis not present

## 2020-07-31 DIAGNOSIS — J069 Acute upper respiratory infection, unspecified: Secondary | ICD-10-CM | POA: Insufficient documentation

## 2020-07-31 DIAGNOSIS — Z20822 Contact with and (suspected) exposure to covid-19: Secondary | ICD-10-CM | POA: Diagnosis not present

## 2020-07-31 DIAGNOSIS — Z87891 Personal history of nicotine dependence: Secondary | ICD-10-CM | POA: Diagnosis not present

## 2020-07-31 DIAGNOSIS — R509 Fever, unspecified: Secondary | ICD-10-CM | POA: Diagnosis present

## 2020-07-31 MED ORDER — ONDANSETRON 4 MG PO TBDP: ORAL_TABLET | ORAL | 0 refills | Status: DC

## 2020-07-31 MED ORDER — BENZONATATE 100 MG PO CAPS: 100.0000 mg | ORAL_CAPSULE | Freq: Three times a day (TID) | ORAL | 0 refills | Status: DC

## 2020-07-31 NOTE — Discharge Instructions (Signed)
Take tylenol 2 pills 4 times a day and motrin 4 pills 3 times a day.  Drink plenty of fluids.  Return for worsening shortness of breath, headache, confusion. Follow up with your family doctor.   

## 2020-07-31 NOTE — ED Triage Notes (Signed)
Pt states fever, headache, sinus pressure, and cough since wed.

## 2020-07-31 NOTE — ED Provider Notes (Signed)
[facility] EMERGENCY DEPARTMENT Provider Note   CSN: [id number] Arrival date & time: 07/31/20  1858     History Chief Complaint  Patient presents with  . Fever    [name] is a 50 y.o. female.  50 yo F with a chief complaints of cough congestion and frequent sneezing.  This is been going on for a couple days now.  No known sick contacts no reported fevers.  Having diarrhea and a loss of sense of smell.  Denies abdominal pain.  Has been eating and drinking but somewhat less.  The history is provided by the patient.  Fever Associated symptoms: congestion and cough   Associated symptoms: no chest pain, no chills, no dysuria, no headaches, no myalgias, no nausea, no rhinorrhea and no vomiting   Illness Severity:  Moderate Onset quality:  Gradual Duration:  2 days Timing:  Constant Progression:  Worsening Chronicity:  New Associated symptoms: congestion, cough and fever   Associated symptoms: no chest pain, no headaches, no myalgias, no nausea, no rhinorrhea, no shortness of breath, no vomiting and no wheezing        Past Medical History:  Diagnosis Date  . Cellulitis 07/2011  . Diabetes mellitus    dx 2016  . GERD (gastroesophageal reflux disease)     Patient Active Problem List   Diagnosis Date Noted  . Cellulitis and abscess of right leg 01/02/2018  . Bilateral carpal tunnel syndrome 07/06/2016  . Left elbow pain 02/17/2016  . Tobacco abuse 10/29/2015  . Injury of right hand 05/19/2015  . Cervical disc disorder with radiculopathy of cervical region 10/10/2013  . Diabetes (HCC) 04/07/2013  . Pyelonephritis 04/06/2013  . RLQ abdominal pain 04/06/2013  . Fever 09/07/2012  . Headache(784.0) 09/07/2012  . Cellulitis 08/05/2011  . Peripheral edema 08/05/2011  . GERD (gastroesophageal reflux disease) 08/05/2011  . Obesity 08/05/2011    Past Surgical History:  Procedure Laterality Date  . ABLATION    . CARPAL TUNNEL RELEASE Left 09/13/2016    Procedure: LEFT CARPAL TUNNEL RELEASE;  Surgeon: Cindee Salt, MD;  Location: [facility] OR;  Service: Orthopedics;  Laterality: Left;  . CHOLECYSTECTOMY    . KNEE ARTHROSCOPY    . LAPAROSCOPIC TUBAL LIGATION  04/29/2011   Procedure: LAPAROSCOPIC TUBAL LIGATION;  Surgeon: Turner Daniels, MD;  Location: [facility] ORS;  Service: Gynecology;  Laterality: Bilateral;  with Filshie Clips  . ROTATOR CUFF REPAIR    . TUBAL LIGATION       OB History   No obstetric history on file.     Family History  Problem Relation Age of Onset  . Cancer Mother   . Diabetes Mother   . Hypertension Mother     Social History   Tobacco Use  . Smoking status: Former Smoker    Packs/day: 0.50    Years: 2.00    Pack years: 1.00    Types: Cigarettes    Quit date: 07/22/2011    Years since quitting: 9.0  . Smokeless tobacco: Never Used  Vaping Use  . Vaping Use: Never used  Substance Use Topics  . Alcohol use: Yes    Alcohol/week: 0.0 standard drinks    Comment: occ  . Drug use: No    Home Medications Prior to Admission medications   Medication Sig Start Date End Date Taking? Authorizing Provider  benzonatate (TESSALON) 100 MG capsule Take 1 capsule (100 mg total) by mouth every 8 (eight) hours. 07/31/20  Yes Melene Plan, DO  ondansetron [facility]  ODT) 4 MG disintegrating tablet 4mg  ODT q4 hours prn nausea/vomit 07/31/20  Yes 08/02/20, DO  acetaminophen (TYLENOL) 500 MG tablet Take 1,000 mg by mouth every 6 (six) hours as needed for mild pain.    [provider]  diclofenac (VOLTAREN) 75 MG EC tablet Take 1 tablet (75 mg total) by mouth 2 (two) times daily. 06/27/20   08/27/20, MD  HYDROcodone-acetaminophen (NORCO/VICODIN) 5-325 MG tablet Take 2 tablets by mouth every 4 (four) hours as needed. 01/18/20   Henderly, Britni A, PA-C  insulin NPH-regular (NOVOLIN 70/30) (70-30) 100 UNIT/ML injection Inject 13 Units into the skin 2 (two) times daily with a meal. Patient taking differently: Inject 10 Units into the  skin daily with supper. 04/08/13   Dhungel, Nishant, MD  lidocaine (LIDODERM) 5 % Place 1 patch onto the skin daily. Remove & Discard patch within 12 hours or as directed by MD 01/18/20   Henderly, Britni A, PA-C  methocarbamol (ROBAXIN) 500 MG tablet Take 1 tablet (500 mg total) by mouth 2 (two) times daily. 01/18/20   Henderly, Britni A, PA-C  nitroGLYCERIN (NITRODUR - DOSED IN MG/24 HR) 0.2 mg/hr patch Apply 1/4th patch to affected elbow/forearm, change daily 08/06/18   Hudnall, 08/08/18, MD  omeprazole (PRILOSEC) 40 MG capsule Take 40 mg by mouth daily.    [provider]  predniSONE (DELTASONE) 20 MG tablet Take 2 tablets (40 mg total) by mouth daily. 06/27/20   08/27/20, MD  promethazine (PHENERGAN) 25 MG tablet Take 1 tablet (25 mg total) by mouth every 6 (six) hours as needed for nausea or vomiting. 10/14/19   Tegeler, 10/16/19, MD  pantoprazole (PROTONIX) 20 MG tablet Take 1 tablet (20 mg total) by mouth daily. 05/26/17 06/27/20  08/27/20, MD    Allergies    Diclofenac, Vancomycin, and Adhesive [tape]  Review of Systems   Review of Systems  Constitutional: Positive for fever. Negative for chills.  HENT: Positive for congestion. Negative for rhinorrhea.   Eyes: Negative for redness and visual disturbance.  Respiratory: Positive for cough. Negative for shortness of breath and wheezing.   Cardiovascular: Negative for chest pain and palpitations.  Gastrointestinal: Negative for nausea and vomiting.  Genitourinary: Negative for dysuria and urgency.  Musculoskeletal: Negative for arthralgias and myalgias.  Skin: Negative for pallor and wound.  Neurological: Negative for dizziness and headaches.    Physical Exam Updated Vital Signs BP 117/72 (BP Location: Left Arm)   Pulse 97   Temp 98.8 F (37.1 C) (Oral)   Resp 18   Ht 5\' 1"  (1.549 m)   Wt 127.9 kg   SpO2 96%   BMI 53.26 kg/m   Physical Exam Vitals and nursing note reviewed.  Constitutional:       General: She is not in acute distress.    Appearance: She is well-developed. She is not diaphoretic.     Comments: BMI 52  HENT:     Head: Normocephalic and atraumatic.     Comments: Swollen turbinates, posterior nasal drip, no noted sinus ttp, tm normal bilaterally.   Eyes:     Pupils: Pupils are equal, round, and reactive to light.  Cardiovascular:     Rate and Rhythm: Normal rate and regular rhythm.     Heart sounds: No murmur heard. No friction rub. No gallop.   Pulmonary:     Effort: Pulmonary effort is normal.     Breath sounds: No wheezing or rales.  Abdominal:  General: There is no distension.     Palpations: Abdomen is soft.     Tenderness: There is no abdominal tenderness.  Musculoskeletal:        General: No tenderness.     Cervical back: Normal range of motion and neck supple.  Skin:    General: Skin is warm and dry.  Neurological:     Mental Status: She is alert and oriented to person, place, and time.  Psychiatric:        Behavior: Behavior normal.     ED Results / Procedures / Treatments   Labs (all labs ordered are listed, but only abnormal results are displayed) Labs Reviewed  SARS CORONAVIRUS 2 (TAT 6-24 HRS)    EKG None  Radiology No results found.  Procedures Procedures   Medications Ordered in ED Medications - No data to display  ED Course  I have reviewed the triage vital signs and the nursing notes.  Pertinent labs & imaging results that were available during my care of the patient were reviewed by me and considered in my medical decision making (see chart for details).    MDM Rules/Calculators/A&P                          50 yo F with upper respiratory symptoms going on for couple days.  Well-appearing nontoxic no hypoxia.  Will discharge the patient home.  PCP follow-up.  8:39 PM:  I have discussed the diagnosis/risks/treatment options with the patient and believe the pt to be eligible for discharge home to follow-up with PCP.  We also discussed returning to the ED immediately if new or worsening sx occur. We discussed the sx which are most concerning (e.g., sudden worsening pain, fever, inability to tolerate by mouth) that necessitate immediate return. Medications administered to the patient during their visit and any new prescriptions provided to the patient are listed below.  Medications given during this visit Medications - No data to display   The patient appears reasonably screen and/or stabilized for discharge and I doubt any other medical condition or other [facility] requiring further screening, evaluation, or treatment in the ED at this time prior to discharge.   Final Clinical Impression(s) / ED Diagnoses Final diagnoses:  Viral upper respiratory tract infection    Rx / DC Orders ED Discharge Orders         Ordered    ondansetron (ZOFRAN ODT) 4 MG disintegrating tablet        07/31/20 2036    benzonatate (TESSALON) 100 MG capsule  Every 8 hours        07/31/20 2036           Melene Plan, DO 07/31/20 2039

## 2020-08-01 LAB — SARS CORONAVIRUS 2 (TAT 6-24 HRS): SARS Coronavirus 2: NEGATIVE

## 2020-08-17 ENCOUNTER — Encounter (HOSPITAL_BASED_OUTPATIENT_CLINIC_OR_DEPARTMENT_OTHER): Payer: Self-pay | Admitting: Emergency Medicine

## 2020-08-17 ENCOUNTER — Emergency Department (HOSPITAL_BASED_OUTPATIENT_CLINIC_OR_DEPARTMENT_OTHER)
Admission: EM | Admit: 2020-08-17 | Discharge: 2020-08-17 | Disposition: A | Discharge status: Home / Self Care | Payer: 59 | Attending: Emergency Medicine | Admitting: Emergency Medicine

## 2020-08-17 ENCOUNTER — Other Ambulatory Visit: Payer: Self-pay

## 2020-08-17 DIAGNOSIS — X58XXXA Exposure to other specified factors, initial encounter: Secondary | ICD-10-CM | POA: Diagnosis not present

## 2020-08-17 DIAGNOSIS — Z87891 Personal history of nicotine dependence: Secondary | ICD-10-CM | POA: Insufficient documentation

## 2020-08-17 DIAGNOSIS — S161XXA Strain of muscle, fascia and tendon at neck level, initial encounter: Secondary | ICD-10-CM | POA: Diagnosis not present

## 2020-08-17 DIAGNOSIS — S199XXA Unspecified injury of neck, initial encounter: Secondary | ICD-10-CM | POA: Diagnosis present

## 2020-08-17 DIAGNOSIS — E119 Type 2 diabetes mellitus without complications: Secondary | ICD-10-CM | POA: Diagnosis not present

## 2020-08-17 DIAGNOSIS — M62838 Other muscle spasm: Secondary | ICD-10-CM

## 2020-08-17 DIAGNOSIS — Z794 Long term (current) use of insulin: Secondary | ICD-10-CM | POA: Diagnosis not present

## 2020-08-17 MED ORDER — METHYLPREDNISOLONE 4 MG PO TBPK: ORAL_TABLET | ORAL | 0 refills | Status: DC

## 2020-08-17 MED ORDER — CYCLOBENZAPRINE HCL 10 MG PO TABS: 10.0000 mg | ORAL_TABLET | Freq: Two times a day (BID) | ORAL | 0 refills | Status: DC | PRN

## 2020-08-17 MED ORDER — CYCLOBENZAPRINE HCL 10 MG PO TABS
10.0000 mg | ORAL_TABLET | Freq: Once | ORAL | Status: AC
  Administered 2020-08-17: 10 mg via ORAL
  Filled 2020-08-17: qty 1

## 2020-08-17 NOTE — Discharge Instructions (Signed)
In addition to prescribed medication recommend 1000 mg of Tylenol every 6 hours as needed for pain.

## 2020-08-17 NOTE — ED Provider Notes (Signed)
[facility] EMERGENCY DEPARTMENT Provider Note   CSN: [id number] Arrival date & time: 08/17/20  2010     History Chief Complaint  Patient presents with  . Neck Pain    [name] is a 50 y.o. female.  The history is provided by the patient.  Neck Pain Pain location:  R side Quality:  Stiffness Pain radiates to:  Does not radiate Pain severity:  Mild Onset quality:  Gradual Timing:  Constant Progression:  Unchanged Chronicity:  New Relieved by:  Nothing Worsened by:  Nothing Associated symptoms: no bladder incontinence, no bowel incontinence, no chest pain, no fever, no headaches, no leg pain, no numbness, no paresis, no photophobia, no syncope, no tingling, no visual change, no weakness and no weight loss        Past Medical History:  Diagnosis Date  . Cellulitis 07/2011  . Diabetes mellitus    dx 2016  . GERD (gastroesophageal reflux disease)     Patient Active Problem List   Diagnosis Date Noted  . Cellulitis and abscess of right leg 01/02/2018  . Bilateral carpal tunnel syndrome 07/06/2016  . Left elbow pain 02/17/2016  . Tobacco abuse 10/29/2015  . Injury of right hand 05/19/2015  . Cervical disc disorder with radiculopathy of cervical region 10/10/2013  . Diabetes (HCC) 04/07/2013  . Pyelonephritis 04/06/2013  . RLQ abdominal pain 04/06/2013  . Fever 09/07/2012  . Headache(784.0) 09/07/2012  . Cellulitis 08/05/2011  . Peripheral edema 08/05/2011  . GERD (gastroesophageal reflux disease) 08/05/2011  . Obesity 08/05/2011    Past Surgical History:  Procedure Laterality Date  . ABLATION    . CARPAL TUNNEL RELEASE Left 09/13/2016   Procedure: LEFT CARPAL TUNNEL RELEASE;  Surgeon: Cindee Salt, MD;  Location: [facility] OR;  Service: Orthopedics;  Laterality: Left;  . CHOLECYSTECTOMY    . KNEE ARTHROSCOPY    . LAPAROSCOPIC TUBAL LIGATION  04/29/2011   Procedure: LAPAROSCOPIC TUBAL LIGATION;  Surgeon: Turner Daniels, MD;  Location: [facility];  Service:  Gynecology;  Laterality: Bilateral;  with Filshie Clips  . ROTATOR CUFF REPAIR    . TUBAL LIGATION       OB History   No obstetric history on file.     Family History  Problem Relation Age of Onset  . Cancer Mother   . Diabetes Mother   . Hypertension Mother     Social History   Tobacco Use  . Smoking status: Former Smoker    Packs/day: 0.50    Years: 2.00    Pack years: 1.00    Types: Cigarettes    Quit date: 07/22/2011    Years since quitting: 9.0  . Smokeless tobacco: Never Used  Vaping Use  . Vaping Use: Never used  Substance Use Topics  . Alcohol use: Yes    Alcohol/week: 0.0 standard drinks    Comment: occ  . Drug use: No    Home Medications Prior to Admission medications   Medication Sig Start Date End Date Taking? Authorizing Provider  cyclobenzaprine (FLEXERIL) 10 MG tablet Take 1 tablet (10 mg total) by mouth 2 (two) times daily as needed for up to 20 doses for muscle spasms. 08/17/20  Yes Virga Haltiwanger, DO  methylPREDNISolone (MEDROL DOSEPAK) 4 MG TBPK tablet Follow package insert 08/17/20  Yes Laiylah Roettger, DO  acetaminophen (TYLENOL) 500 MG tablet Take 1,000 mg by mouth every 6 (six) hours as needed for mild pain.    [provider]  benzonatate (TESSALON) 100 MG capsule Take  1 capsule (100 mg total) by mouth every 8 (eight) hours. 07/31/20   Melene Plan, DO  diclofenac (VOLTAREN) 75 MG EC tablet Take 1 tablet (75 mg total) by mouth 2 (two) times daily. 06/27/20   Mardella Layman, MD  HYDROcodone-acetaminophen (NORCO/VICODIN) 5-325 MG tablet Take 2 tablets by mouth every 4 (four) hours as needed. 01/18/20   Henderly, Britni A, PA-C  insulin NPH-regular (NOVOLIN 70/30) (70-30) 100 UNIT/ML injection Inject 13 Units into the skin 2 (two) times daily with a meal. Patient taking differently: Inject 10 Units into the skin daily with supper. 04/08/13   Dhungel, Nishant, MD  lidocaine (LIDODERM) 5 % Place 1 patch onto the skin daily. Remove & Discard patch within  12 hours or as directed by MD 01/18/20   Henderly, Britni A, PA-C  methocarbamol (ROBAXIN) 500 MG tablet Take 1 tablet (500 mg total) by mouth 2 (two) times daily. 01/18/20   Henderly, Britni A, PA-C  nitroGLYCERIN (NITRODUR - DOSED IN MG/24 HR) 0.2 mg/hr patch Apply 1/4th patch to affected elbow/forearm, change daily 08/06/18   Hudnall, Azucena Fallen, MD  omeprazole (PRILOSEC) 40 MG capsule Take 40 mg by mouth daily.    [provider]  ondansetron (ZOFRAN ODT) 4 MG disintegrating tablet 4mg  ODT q4 hours prn nausea/vomit 07/31/20   08/02/20, DO  predniSONE (DELTASONE) 20 MG tablet Take 2 tablets (40 mg total) by mouth daily. 06/27/20   08/27/20, MD  promethazine (PHENERGAN) 25 MG tablet Take 1 tablet (25 mg total) by mouth every 6 (six) hours as needed for nausea or vomiting. 10/14/19   Tegeler, 10/16/19, MD  pantoprazole (PROTONIX) 20 MG tablet Take 1 tablet (20 mg total) by mouth daily. 05/26/17 06/27/20  08/27/20, MD    Allergies    Diclofenac, Vancomycin, and Adhesive [tape]  Review of Systems   Review of Systems  Constitutional: Negative for fever and weight loss.  Eyes: Negative for photophobia.  Cardiovascular: Negative for chest pain and syncope.  Gastrointestinal: Negative for bowel incontinence.  Genitourinary: Negative for bladder incontinence.  Musculoskeletal: Positive for neck pain and neck stiffness. Negative for arthralgias, back pain, gait problem, joint swelling and myalgias.  Skin: Negative for color change, pallor, rash and wound.  Neurological: Negative for tingling, weakness, numbness and headaches.    Physical Exam Updated Vital Signs BP (!) 131/52 (BP Location: Left Arm)   Pulse (!) 101   Temp 98.6 F (37 C) (Oral)   Resp 20   Ht 5\' 1"  (1.549 m)   Wt 127.9 kg   SpO2 96%   BMI 53.28 kg/m   Physical Exam Constitutional:      General: She is not in acute distress.    Appearance: She is not ill-appearing.  HENT:     Head: Normocephalic and  atraumatic.  Neck:     Comments: Overall good range of motion of the neck, tenderness to the right-sided paraspinal muscles of the cervical spine into the right trapezius area, no midline spinal tenderness Cardiovascular:     Pulses: Normal pulses.  Musculoskeletal:        General: Tenderness present. Normal range of motion.     Cervical back: Normal range of motion. Tenderness present.     Comments: No midline spinal tenderness, tenderness to the right trapezius area  Skin:    General: Skin is warm.     Capillary Refill: Capillary refill takes less than 2 seconds.  Neurological:     General: No focal deficit present.  Mental Status: She is alert.     Sensory: No sensory deficit.     Motor: No weakness.     Comments: 5+ out of 5 strength throughout, normal sensation     ED Results / Procedures / Treatments   Labs (all labs ordered are listed, but only abnormal results are displayed) Labs Reviewed - No data to display  EKG None  Radiology No results found.  Procedures Procedures   Medications Ordered in ED Medications  cyclobenzaprine (FLEXERIL) tablet 10 mg (has no administration in time range)    ED Course  I have reviewed the triage vital signs and the nursing notes.  Pertinent labs & imaging results that were available during my care of the patient were reviewed by me and considered in my medical decision making (see chart for details).    MDM Rules/Calculators/A&P                          [name] is here with neck pain.  Normal vitals.  No fever.  History of diabetes.  Overall tenderness to the paraspinal muscles of the right side cervical muscles into the right trapezius area.  Overall suspect muscle spasm.  No midline spinal pain.  No history of trauma.  Normal strength and sensation of the upper extremities.  No concern for cord injury but could have some peripheral nerve irritation.  Will prescribe Medrol Dosepak.  Patient states she feels  comfortable with insulin adjustments as needed.  We will also prescribe Flexeril.  Recommend follow-up primary care doctor.  Discharged from the ED in good condition.  This chart was dictated using voice recognition software.  Despite best efforts to proofread,  errors can occur which can change the documentation meaning.   Final Clinical Impression(s) / ED Diagnoses Final diagnoses:  Acute strain of neck muscle, initial encounter  Muscle spasms of neck    Rx / DC Orders ED Discharge Orders         Ordered    methylPREDNISolone (MEDROL DOSEPAK) 4 MG TBPK tablet        08/17/20 2221    cyclobenzaprine (FLEXERIL) 10 MG tablet  2 times daily PRN        08/17/20 2221           Virgina Norfolk, DO 08/17/20 2222

## 2020-08-17 NOTE — ED Triage Notes (Signed)
Patient presents with complaints of right side neck pain; states onset this am; states took tylenol at 0900 today with no relief; denies any known injury.

## 2020-09-04 ENCOUNTER — Encounter: Payer: Self-pay | Admitting: Orthopedic Surgery

## 2020-09-04 ENCOUNTER — Other Ambulatory Visit: Payer: Self-pay

## 2020-09-04 ENCOUNTER — Ambulatory Visit (INDEPENDENT_AMBULATORY_CARE_PROVIDER_SITE_OTHER): Payer: 59 | Admitting: Orthopedic Surgery

## 2020-09-04 VITALS — Ht 61.0 in | Wt 281.0 lb

## 2020-09-04 DIAGNOSIS — M7532 Calcific tendinitis of left shoulder: Secondary | ICD-10-CM

## 2020-09-04 DIAGNOSIS — M7502 Adhesive capsulitis of left shoulder: Secondary | ICD-10-CM | POA: Diagnosis not present

## 2020-09-04 NOTE — Progress Notes (Signed)
Office Visit Note   Patient: [name]           Date of Birth: 1970-07-12           MRN: [id number] Visit Date: 09/04/2020 Requested by: No referring provider defined for this encounter. PCP: Pcp, No  Subjective: Chief Complaint  Patient presents with  . Left Shoulder - Follow-up    HPI: [name] is a 50 y.o. female who presents to the office complaining of left shoulder pain.  Patient reports that her shoulder pain feels about the same as it did 6 weeks ago.  She reports that subacromial injection at last visit helped for about 4 days but now her pain is pretty much back to how it was.  She localizes pain to the lateral aspect of the shoulder with some radiation into the mid humerus.  No radiation down the arm.  Does have occasional neck pain but no shoulder blade pain or recurrent numbness or tingling that travels down the arm.  She does have some numbness and tingling in the fourth and fifth fingers of the left hand.  She is left-handed.  She works at [employer].  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  She is taking Tylenol every day.  She also does have a history of diabetes for which she is on insulin.  Blood glucose usually runs about 1:30 in the morning.  Last A1c was checked in about October 2020 and revealed A1c of 10.9.Marland Kitchen                ROS: All systems reviewed are negative as they relate to the chief complaint within the history of present illness.  Patient denies fevers or chills.  Assessment & Plan: Visit Diagnoses:  1. Calcific tendinitis of left shoulder   2. Adhesive capsulitis of left shoulder     Plan: Patient is a 50 year old female who presents complaining of left shoulder pain.  She had subacromial injection that provided 4 days relief.  She does have history of left rotator cuff repair 5 years ago at an outside facility.  With today's examination, she does have decreased range of motion of the left shoulder compared with the contralateral side,  concerning for adhesive capsulitis.  Excellent rotator cuff strength though she does have some increased pain with supraspinatus strength testing.  Impression is adhesive capsulitis with concurrent calcific tendinitis.  Suspect that most of her pain may be stemming from the frozen shoulder.  Plan to try home exercise program and exercises were detailed to the patient.  She will do these exercises focusing on shoulder range of motion.  Also considered glenohumeral cortisone injection but with her last A1c being several years ago, want to recheck this prior to administering another injection.  She also reports that she does not have a primary care provider.  Plan to refer her to [facility] primary care office on [address] per her request.  If her A1c is fairly well controlled, plan to call patient next week to schedule her for left glenohumeral joint injection.  Follow-up in 6 weeks for clinical recheck regarding her range of motion.  Follow-Up Instructions: No follow-ups on file.   Orders:  Orders Placed This Encounter  Procedures  . HgB A1c   No orders of the defined types were placed in this encounter.     Procedures: No procedures performed   Clinical Data: No additional findings.  Objective: Vital Signs: Ht 5\' 1"  (1.549 m)   Wt 281  lb (127.5 kg)   BMI 53.09 kg/m   Physical Exam:  Constitutional: Patient appears well-developed HEENT:  Head: Normocephalic Eyes:EOM are normal Neck: Normal range of motion Cardiovascular: Normal rate Pulmonary/chest: Effort normal Neurologic: Patient is alert Skin: Skin is warm Psychiatric: Patient has normal mood and affect  Ortho Exam: Right shoulder with 50 degrees external rotation, 120 degrees abduction, 170 degrees forward flexion.  Left shoulder with 30 degrees external rotation, 80 degrees abduction, 120 degrees forward flexion.  Excellent rotator cuff strength with 5/5 motor strength of supraspinatus, infraspinatus, subscapularis.   Some anterior and posterior crepitus noted with a slight clicking sensation.  No tenderness over the [facility] joint.  Mild tenderness over the bicipital groove.  Negative Hornblower sign.  Negative drop arm test.  Specialty Comments:  No specialty comments available.  Imaging: No results found.   PMFS History: Patient Active Problem List   Diagnosis Date Noted  . Cellulitis and abscess of right leg 01/02/2018  . Bilateral carpal tunnel syndrome 07/06/2016  . Left elbow pain 02/17/2016  . Tobacco abuse 10/29/2015  . Injury of right hand 05/19/2015  . Cervical disc disorder with radiculopathy of cervical region 10/10/2013  . Diabetes (HCC) 04/07/2013  . Pyelonephritis 04/06/2013  . RLQ abdominal pain 04/06/2013  . Fever 09/07/2012  . Headache(784.0) 09/07/2012  . Cellulitis 08/05/2011  . Peripheral edema 08/05/2011  . GERD (gastroesophageal reflux disease) 08/05/2011  . Obesity 08/05/2011   Past Medical History:  Diagnosis Date  . Cellulitis 07/2011  . Diabetes mellitus    dx 2016  . GERD (gastroesophageal reflux disease)     Family History  Problem Relation Age of Onset  . Cancer Mother   . Diabetes Mother   . Hypertension Mother     Past Surgical History:  Procedure Laterality Date  . ABLATION    . CARPAL TUNNEL RELEASE Left 09/13/2016   Procedure: LEFT CARPAL TUNNEL RELEASE;  Surgeon: Cindee Salt, MD;  Location: [facility] OR;  Service: Orthopedics;  Laterality: Left;  . CHOLECYSTECTOMY    . KNEE ARTHROSCOPY    . LAPAROSCOPIC TUBAL LIGATION  04/29/2011   Procedure: LAPAROSCOPIC TUBAL LIGATION;  Surgeon: Turner Daniels, MD;  Location: [facility];  Service: Gynecology;  Laterality: Bilateral;  with Filshie Clips  . ROTATOR CUFF REPAIR    . TUBAL LIGATION     Social History   Occupational History  . Not on file  Tobacco Use  . Smoking status: Former Smoker    Packs/day: 0.50    Years: 2.00    Pack years: 1.00    Types: Cigarettes    Quit date: 07/22/2011    Years since quitting:  9.1  . Smokeless tobacco: Never Used  Vaping Use  . Vaping Use: Never used  Substance and Sexual Activity  . Alcohol use: Yes    Alcohol/week: 0.0 standard drinks    Comment: occ  . Drug use: No  . Sexual activity: Not on file

## 2020-09-05 LAB — EXTRA SPECIMEN

## 2020-09-05 LAB — HEMOGLOBIN A1C
Hgb A1c MFr Bld: 12.8 % of total Hgb — ABNORMAL HIGH (ref <5.7)
Mean Plasma Glucose: 321 mg/dL
eAG (mmol/L): 17.8 mmol/L

## 2020-09-10 ENCOUNTER — Telehealth: Payer: Self-pay

## 2020-09-10 NOTE — Telephone Encounter (Signed)
Patient called she is requesting a call back regarding her lab results call back:[phone]

## 2020-09-15 ENCOUNTER — Telehealth: Payer: Self-pay | Admitting: Orthopedic Surgery

## 2020-09-15 NOTE — Telephone Encounter (Signed)
Hemoglobin A1c is 12.8.  She cannot really have an injection until her blood sugars well controlled.  Could send her into diabetic ketoacidosis if we inject her shoulder.  Please call thanks

## 2020-09-15 NOTE — Telephone Encounter (Signed)
Pls advise.  

## 2020-09-15 NOTE — Telephone Encounter (Signed)
  Pt called wanting to speak with a nurse and know the results from the lab tests as well as if she is able to go ahead and get sched. for next injection. Pt told next injection would need to have lab work cleared first in order to have it approved by provider. The best call back number is [phone].

## 2020-09-16 ENCOUNTER — Telehealth: Payer: Self-pay

## 2020-09-16 NOTE — Telephone Encounter (Signed)
I talked to pt about her lab results. She stated understanding but wants to know what to do from here. She stated her shoulder is getting worse. Please advise

## 2020-09-16 NOTE — Telephone Encounter (Signed)
Lvm for pt to cb to discuss  

## 2020-09-18 NOTE — Telephone Encounter (Signed)
Our hands are tied because her shoulder is getting stiffer and she just really needs to try to use ice and modalities and try to stretch it out as best she can.  If she wants to go to therapy we could do that 2.  Let me know and we can set up therapy if she wants to do that.

## 2020-09-18 NOTE — Telephone Encounter (Signed)
Called her. A1c too high for shoulder injection. She has an appointment for PCP in august to be established.  Can you make her an appointment for ~4 weeks for now for follow-up of shoulder pain

## 2020-09-21 NOTE — Telephone Encounter (Signed)
Lvm for pt to cb 

## 2020-09-21 NOTE — Telephone Encounter (Signed)
Lvm for pt to cb to schedule....please also see other note about trying PT too.

## 2020-09-30 ENCOUNTER — Telehealth: Payer: Self-pay

## 2020-09-30 NOTE — Telephone Encounter (Signed)
sure

## 2020-09-30 NOTE — Telephone Encounter (Signed)
Patients spouse Baldo Ash called he is requesting a referral to be sent to [facility] for patients shoulder, patients spouse stated her shoulder "popped" and she cant move at all call back:[phone]

## 2020-09-30 NOTE — Telephone Encounter (Signed)
Ok for MRI.

## 2020-10-01 ENCOUNTER — Telehealth: Payer: Self-pay

## 2020-10-01 ENCOUNTER — Telehealth: Payer: Self-pay | Admitting: Orthopedic Surgery

## 2020-10-01 NOTE — Telephone Encounter (Signed)
Done see previous notes.

## 2020-10-01 NOTE — Telephone Encounter (Signed)
Patient's spouse [name] called advised someone had called him but, his phone was messing up and he couldn't understand the name of the person that called him back. [name] said his wife is getting worse. [name] said he is trying to get his wife in to see a doctor today or tomorrow. The number to contact [name] is [phone]

## 2020-10-01 NOTE — Telephone Encounter (Signed)
Will see pt in the morning.

## 2020-10-01 NOTE — Telephone Encounter (Signed)
Worked in for Friday a.m Will discuss +/- MRI at that time.

## 2020-10-01 NOTE — Telephone Encounter (Signed)
Patient's husband called stating that patient is in a lot of pain with her left shoulder.  Stated that it's making her nauseous.  Would like to be worked into the schedule today or tomorrow morning.  CB# [phone].  Please advise.  Thank you

## 2020-10-02 ENCOUNTER — Encounter: Payer: Self-pay | Admitting: Orthopedic Surgery

## 2020-10-02 ENCOUNTER — Ambulatory Visit (INDEPENDENT_AMBULATORY_CARE_PROVIDER_SITE_OTHER): Payer: 59 | Admitting: Orthopedic Surgery

## 2020-10-02 DIAGNOSIS — S46212A Strain of muscle, fascia and tendon of other parts of biceps, left arm, initial encounter: Secondary | ICD-10-CM

## 2020-10-02 NOTE — Addendum Note (Signed)
Addended byPrescott Parma on: 10/02/2020 10:50 AM   Modules accepted: Orders

## 2020-10-02 NOTE — Progress Notes (Signed)
Office Visit Note   Patient: [name]           Date of Birth: 06/29/1970           MRN: [id number] Visit Date: 10/02/2020 Requested by: No referring provider defined for this encounter. PCP: Pcp, No  Subjective: No chief complaint on file.   HPI: [name] is a 50 year old patient with left shoulder pain.  Several days ago she felt acute anterior pain and has had swelling in the left arm.  Blood glucose remains slightly elevated.  She has not had an MRI scan yet.  Denies any weakness but does report a lot of pain with range of motion in that left arm since her event.  The event happened when she was lifting a plate at a restaurant.              ROS: All systems reviewed are negative as they relate to the chief complaint within the history of present illness.  Patient denies  fevers or chills.   Assessment & Plan: Visit Diagnoses:  1. Biceps rupture, proximal, left, initial encounter     Plan: Impression is likely proximal biceps rupture.  She does have some swelling in the arm as well as pain with resisted supination.  I do not feel a definite deformity in the left biceps region but she does have asymmetric swelling left versus right.  Rotator cuff strength is still pretty reasonable.  Plan is range of motion of that left shoulder with MRI arthrogram to evaluate both the rotator cuff as well as biceps.  If she does have a proximal rupture I think her pain will improve.  Would not necessarily be inclined to do a biceps tenodesis based on her overall stature.  Follow-up after scan  Follow-Up Instructions: Return for after MRI.   Orders:  No orders of the defined types were placed in this encounter.  No orders of the defined types were placed in this encounter.     Procedures: No procedures performed   Clinical Data: No additional findings.  Objective: Vital Signs: There were no vitals taken for this visit.  Physical Exam:   Constitutional: Patient appears  well-developed HEENT:  Head: Normocephalic Eyes:EOM are normal Neck: Normal range of motion Cardiovascular: Normal rate Pulmonary/chest: Effort normal Neurologic: Patient is alert Skin: Skin is warm Psychiatric: Patient has normal mood and affect   Ortho Exam: Ortho exam demonstrates some swelling in that left arm compared to the right.  EPL FPL interosseous strength is intact.  Does have some pain with resisted supination left versus right.  Tenderness is present in the proximal biceps region.  Distal biceps feels intact.  Rotator cuff strength is reasonable to supraspinatus infraspinatus subscap muscle testing.  No coarse grinding or crepitus with passive range of motion of the shoulder.  Specialty Comments:  No specialty comments available.  Imaging: No results found.   PMFS History: Patient Active Problem List   Diagnosis Date Noted   Cellulitis and abscess of right leg 01/02/2018   Bilateral carpal tunnel syndrome 07/06/2016   Left elbow pain 02/17/2016   Tobacco abuse 10/29/2015   Injury of right hand 05/19/2015   Cervical disc disorder with radiculopathy of cervical region 10/10/2013   Diabetes (HCC) 04/07/2013   Pyelonephritis 04/06/2013   RLQ abdominal pain 04/06/2013   Fever 09/07/2012   Headache(784.0) 09/07/2012   Cellulitis 08/05/2011   Peripheral edema 08/05/2011   GERD (gastroesophageal reflux disease) 08/05/2011   Obesity 08/05/2011   Past  Medical History:  Diagnosis Date   Cellulitis 07/2011   Diabetes mellitus    dx 2016   GERD (gastroesophageal reflux disease)     Family History  Problem Relation Age of Onset   Cancer Mother    Diabetes Mother    Hypertension Mother     Past Surgical History:  Procedure Laterality Date   ABLATION     CARPAL TUNNEL RELEASE Left 09/13/2016   Procedure: LEFT CARPAL TUNNEL RELEASE;  Surgeon: Cindee Salt, MD;  Location: [facility];  Service: Orthopedics;  Laterality: Left;   CHOLECYSTECTOMY     KNEE ARTHROSCOPY      LAPAROSCOPIC TUBAL LIGATION  04/29/2011   Procedure: LAPAROSCOPIC TUBAL LIGATION;  Surgeon: Turner Daniels, MD;  Location: [facility];  Service: Gynecology;  Laterality: Bilateral;  with Filshie Clips   ROTATOR CUFF REPAIR     TUBAL LIGATION     Social History   Occupational History   Not on file  Tobacco Use   Smoking status: Former    Packs/day: 0.50    Years: 2.00    Pack years: 1.00    Types: Cigarettes    Quit date: 07/22/2011    Years since quitting: 9.2   Smokeless tobacco: Never  Vaping Use   Vaping Use: Never used  Substance and Sexual Activity   Alcohol use: Yes    Alcohol/week: 0.0 standard drinks    Comment: occ   Drug use: No   Sexual activity: Not on file

## 2020-10-12 ENCOUNTER — Telehealth: Payer: Self-pay | Admitting: Orthopedic Surgery

## 2020-10-12 ENCOUNTER — Other Ambulatory Visit: Payer: Self-pay | Admitting: Surgical

## 2020-10-12 MED ORDER — TRAMADOL HCL 50 MG PO TABS: 50.0000 mg | ORAL_TABLET | Freq: Two times a day (BID) | ORAL | 0 refills | Status: AC | PRN

## 2020-10-12 NOTE — Telephone Encounter (Signed)
Sent in rx for tramadol

## 2020-10-12 NOTE — Telephone Encounter (Signed)
Patient's spouse called. Says she needs something for pain. She is staying awake at night in pain. Would like something called in. His call back number is [phone]

## 2020-10-21 ENCOUNTER — Telehealth: Payer: Self-pay | Admitting: Orthopedic Surgery

## 2020-10-21 NOTE — Telephone Encounter (Signed)
I called and left vm stating she is to go to [facility] for the MRI.;

## 2020-10-21 NOTE — Telephone Encounter (Signed)
Pt calling to verify where Mri will be held at. States on chart that it was referred to [facility]. Letter she received in mail stated [facility] and wanted to verify that is where the Mri will take place. Pt said the best phone number is [phone] and if she does not answer, to leave a vm as she will be in and out of working.

## 2020-10-24 ENCOUNTER — Ambulatory Visit (HOSPITAL_BASED_OUTPATIENT_CLINIC_OR_DEPARTMENT_OTHER): Payer: 59

## 2020-10-28 ENCOUNTER — Ambulatory Visit: Payer: 59 | Admitting: Surgical

## 2020-11-10 ENCOUNTER — Other Ambulatory Visit: Payer: Self-pay

## 2020-11-10 ENCOUNTER — Ambulatory Visit (INDEPENDENT_AMBULATORY_CARE_PROVIDER_SITE_OTHER): Payer: 59 | Admitting: Family

## 2020-11-10 ENCOUNTER — Encounter: Payer: Self-pay | Admitting: Family

## 2020-11-10 VITALS — BP 112/75 | HR 97 | Temp 97.9°F | Resp 16 | Ht 60.98 in | Wt 283.0 lb

## 2020-11-10 DIAGNOSIS — Z794 Long term (current) use of insulin: Secondary | ICD-10-CM

## 2020-11-10 DIAGNOSIS — E119 Type 2 diabetes mellitus without complications: Secondary | ICD-10-CM | POA: Diagnosis not present

## 2020-11-10 DIAGNOSIS — Z7689 Persons encountering health services in other specified circumstances: Secondary | ICD-10-CM | POA: Diagnosis not present

## 2020-11-10 DIAGNOSIS — Z23 Encounter for immunization: Secondary | ICD-10-CM | POA: Diagnosis not present

## 2020-11-10 MED ORDER — TRUE METRIX METER W/DEVICE KIT: PACK | 0 refills | Status: AC

## 2020-11-10 MED ORDER — TRUE METRIX BLOOD GLUCOSE TEST VI STRP: ORAL_STRIP | 12 refills | Status: AC

## 2020-11-10 MED ORDER — GLIMEPIRIDE 1 MG PO TABS: 1.0000 mg | ORAL_TABLET | Freq: Every day | ORAL | 0 refills | Status: DC

## 2020-11-10 MED ORDER — PEN NEEDLES 31G X 8 MM MISC: 0 refills | Status: DC

## 2020-11-10 MED ORDER — INSULIN GLARGINE 100 UNIT/ML SOLOSTAR PEN: 10.0000 [IU] | PEN_INJECTOR | Freq: Every day | SUBCUTANEOUS | 0 refills | Status: DC

## 2020-11-10 MED ORDER — TRUEPLUS LANCETS 28G MISC: 4 refills | Status: AC

## 2020-11-10 NOTE — Progress Notes (Signed)
Pt presents to establish care no major concerns besides diabetes, pt states that she has been cutting out her desserts Desires Shingles, Pneumococcal and Tdap vaccines

## 2020-11-10 NOTE — Progress Notes (Signed)
Subjective:    [name] - 50 y.o. female MRN [id number]  Date of birth: [birth year]  HPI  [name] is to establish care. Patient has a PMH significant for gastroesophageal reflux disease, diabetes, cervical disc disorder with radiculopathy of cervical region, bilateral carpal tunnel syndrome, pyelonephritis, cellulitis, peripheral edema, and tobacco abuse.    Current issues and/or concerns: DIABETES TYPE 2: Med Adherence:  [x]  Yes    []  No Medication side effects:  []  Yes    [x]  No Home Monitoring?  []  Yes    [x]  No Diet Adherence: trying to improve, has cut out sodas Exercise: []  Yes    [x]  No Comments: Metformin makes stomach hurt. Would like to try Lantus pen. Currently taking 70/30 mix because her insurance would not cover Lantus pen, now has new insurance.    ROS per HPI    Health Maintenance:  Health Maintenance Due  Topic Date Due   COVID-19 Vaccine (1) Never done   FOOT EXAM  Never done   OPHTHALMOLOGY EXAM  Never done   URINE MICROALBUMIN  Never done   Hepatitis C Screening  Never done   TETANUS/TDAP  Never done   PAP SMEAR-Modifier  Never done   Pneumococcal Vaccine 35-25 Years old (2 - PCV) 10/28/2016   Zoster Vaccines- Shingrix (1 of 2) Never done   MAMMOGRAM  09/13/2020     Past Medical History: Patient Active Problem List   Diagnosis Date Noted   Left lumbar radiculopathy 02/22/2019   Trigger finger of left thumb 01/07/2019   Cellulitis and abscess of right leg 01/02/2018   Bilateral carpal tunnel syndrome 07/06/2016   Left elbow pain 02/17/2016   Uncontrolled type 2 diabetes mellitus with hyperglycemia ([place]) 10/29/2015   Uncontrolled type 2 diabetes mellitus without complication, with long-term current use of insulin 10/29/2015   Injury of right hand 05/19/2015   Cervical disc disorder with radiculopathy, unspecified cervical region 10/10/2013   Diabetes ([place]) 04/07/2013   Pyelonephritis 04/06/2013   RLQ abdominal pain 04/06/2013    Fever 09/07/2012   Headache(784.0) 09/07/2012   Cellulitis 08/05/2011   Peripheral edema 08/05/2011   Gastroesophageal reflux disease 08/05/2011   BMI 50.0-59.9, adult ([place]) 08/05/2011    Social History   reports that she quit smoking about 9 years ago. Her smoking use included cigarettes. She has a 1.00 pack-year smoking history. She has never used smokeless tobacco. She reports current alcohol use. She reports that she does not use drugs.   Family History  family history includes Cancer in her mother; Diabetes in her mother; Hypertension in her mother.   Medications: reviewed and updated   Objective:   Physical Exam BP 112/75 (BP Location: Left Arm, Patient Position: Sitting, Cuff Size: Large)   Pulse 97   Temp 97.9 F (36.6 C)   Resp 16   Ht 5' 0.98" (1.549 m)   Wt 283 lb (128.4 kg)   SpO2 95%   BMI 53.50 kg/m  Physical Exam HENT:     Head: Normocephalic and atraumatic.  Eyes:     General:        Right eye: No discharge.     Extraocular Movements: Extraocular movements intact.     Conjunctiva/sclera: Conjunctivae normal.     Pupils: Pupils are equal, round, and reactive to light.  Cardiovascular:     Rate and Rhythm: Normal rate and regular rhythm.     Pulses: Normal pulses.     Heart sounds: Normal heart sounds.  Pulmonary:  Effort: Pulmonary effort is normal.     Breath sounds: Normal breath sounds.  Musculoskeletal:     Cervical back: Normal range of motion and neck supple.  Neurological:     General: No focal deficit present.     Mental Status: She is alert and oriented to person, place, and time.  Psychiatric:        Mood and Affect: Mood normal.        Behavior: Behavior normal.      Assessment & Plan:  1. Encounter to establish care: - Patient presents today to establish care.  - Return for annual physical examination, labs, and health maintenance. Arrive fasting meaning having no food for at least 8 hours prior to appointment. You may have only  water or black coffee. Please take scheduled medications as normal.  2. Type 2 diabetes mellitus without complication, with long-term current use of insulin ([place]): - Hemoglobin not at goal at 12.8% on 09/04/2020, goal < 7%. Next hemoglobin A1c due August 2022.  - Insulin NPH regular discontinued per patient preference.  - Begin Insulin Glargine and Glimepiride as prescribed.  - Discussed the importance of healthy eating habits, low-carbohydrate diet, low-sugar diet, regular aerobic exercise (at least 150 minutes a week as tolerated) and medication compliance to achieve or maintain control of diabetes. - To achieve an A1C goal of less than or equal to 7.0 percent, a fasting blood sugar of 80 to 130 mg/dL and a postprandial glucose (90 to 120 minutes after a meal) less than 180 mg/dL. In the event of sugars less than 60 mg/dl or greater than 400 mg/dl please notify the clinic ASAP. It is recommended that you undergo annual eye exams and annual foot exams. - Follow-up with primary provider in 4 weeks or sooner if needed.  - glimepiride (AMARYL) 1 MG tablet; Take 1 tablet (1 mg total) by mouth daily with breakfast.  Dispense: 30 tablet; Refill: 0 - insulin glargine (LANTUS) 100 UNIT/ML Solostar Pen; Inject 10 Units into the skin at bedtime.  Dispense: 3 mL; Refill: 0 - Insulin Pen Needle (PEN NEEDLES) 31G X 8 MM MISC; UAD  Dispense: 100 each; Refill: 0 - Blood Glucose Monitoring Suppl (TRUE METRIX METER) w/Device KIT; Use as directed  Dispense: 1 kit; Refill: 0 - glucose blood (TRUE METRIX BLOOD GLUCOSE TEST) test strip; Use as instructed  Dispense: 100 each; Refill: 12 - TRUEplus Lancets 28G MISC; Use as directed  Dispense: 100 each; Refill: 4  3. Need for shingles vaccine: - Administered today in office. - Varicella-zoster vaccine IM  4. Need for pneumococcal vaccination: - Administered today in office.  - Pneumococcal polysaccharide vaccine 23-valent greater than or equal to 2yo  subcutaneous/IM    Patient was given clear instructions to go to Emergency Department or return to medical center if symptoms don't improve, worsen, or new problems develop.The patient verbalized understanding.  I discussed the assessment and treatment plan with the patient. The patient was provided an opportunity to ask questions and all were answered. The patient agreed with the plan and demonstrated an understanding of the instructions.   The patient was advised to call back or seek an in-person evaluation if the symptoms worsen or if the condition fails to improve as anticipated.    Durene Fruits, NP 11/10/2020, 4:05 PM Primary Care at [facility]

## 2020-11-11 ENCOUNTER — Ambulatory Visit (HOSPITAL_COMMUNITY)
Admission: RE | Admit: 2020-11-11 | Discharge: 2020-11-11 | Disposition: A | Discharge status: Home / Self Care | Payer: 59 | Source: Ambulatory Visit | Attending: Orthopedic Surgery | Admitting: Orthopedic Surgery

## 2020-11-11 DIAGNOSIS — S46212A Strain of muscle, fascia and tendon of other parts of biceps, left arm, initial encounter: Secondary | ICD-10-CM | POA: Insufficient documentation

## 2020-11-11 DIAGNOSIS — M66829 Spontaneous rupture of other tendons, unspecified upper arm: Secondary | ICD-10-CM | POA: Diagnosis present

## 2020-11-11 LAB — MICROALBUMIN / CREATININE URINE RATIO
Creatinine, Urine: 217.3 mg/dL
Microalb/Creat Ratio: 29 mg/g creat (ref 0–29)
Microalbumin, Urine: 63 ug/mL

## 2020-11-11 MED ORDER — LIDOCAINE HCL (PF) 1 % IJ SOLN
5.0000 mL | Freq: Once | INTRAMUSCULAR | Status: AC
  Administered 2020-11-11: 10 mL via INTRADERMAL

## 2020-11-11 MED ORDER — GADOBUTROL 1 MMOL/ML IV SOLN: 1.0000 mL | Freq: Once | INTRAVENOUS | Status: DC | PRN

## 2020-11-11 MED ORDER — IOHEXOL 180 MG/ML  SOLN
20.0000 mL | Freq: Once | INTRAMUSCULAR | Status: AC | PRN
  Administered 2020-11-11: 20 mL via INTRATHECAL

## 2020-11-11 MED ORDER — GADOBUTROL 1 MMOL/ML IV SOLN
0.1000 mL | Freq: Once | INTRAVENOUS | Status: AC | PRN
  Administered 2020-11-11: 0.05 mL

## 2020-11-11 MED ORDER — SODIUM CHLORIDE (PF) 0.9 % IJ SOLN
10.0000 mL | Freq: Once | INTRAMUSCULAR | Status: AC
  Administered 2020-11-11: 10 mL

## 2020-11-11 NOTE — Progress Notes (Signed)
Kidney function normal. There is no significant amount of protein in the urine. This is good news.

## 2020-11-13 ENCOUNTER — Encounter: Payer: Self-pay | Admitting: Surgical

## 2020-11-13 ENCOUNTER — Other Ambulatory Visit: Payer: Self-pay

## 2020-11-13 ENCOUNTER — Ambulatory Visit (INDEPENDENT_AMBULATORY_CARE_PROVIDER_SITE_OTHER): Payer: 59 | Admitting: Surgical

## 2020-11-13 ENCOUNTER — Ambulatory Visit: Payer: Self-pay

## 2020-11-13 DIAGNOSIS — M7532 Calcific tendinitis of left shoulder: Secondary | ICD-10-CM

## 2020-11-13 MED ORDER — LIDOCAINE HCL 1 % IJ SOLN
5.0000 mL | INTRAMUSCULAR | Status: AC | PRN
  Administered 2020-11-13: 5 mL

## 2020-11-13 MED ORDER — BUPIVACAINE HCL 0.5 % IJ SOLN
9.0000 mL | INTRAMUSCULAR | Status: AC | PRN
Start: 2020-11-13 — End: 2020-11-13
  Administered 2020-11-13: 9 mL via INTRA_ARTICULAR

## 2020-11-13 NOTE — Progress Notes (Signed)
Office Visit Note   Patient: [name]           Date of Birth: 01-28-1971           MRN: [id number] Visit Date: 11/13/2020 Requested by: No referring provider defined for this encounter. PCP: System, Provider Not In  Subjective: Chief Complaint  Patient presents with   Other     Scan review    HPI: [name] is a 50 y.o. female who presents to the office complaining of left shoulder pain.  Patient is here to review MRI of the left shoulder.  She has recently gotten established with a primary care physician and has been working on her blood glucose control which has brought her A1c down to 12 from 12.8.  This was last checked on 11/10/2020.  She is using oral medication as well as Lantus insulin.  She complains her left shoulder is swollen and has severe pain in the anterior and lateral aspect of the shoulder..                ROS: All systems reviewed are negative as they relate to the chief complaint within the history of present illness.  Patient denies fevers or chills.  Assessment & Plan: Visit Diagnoses:  1. Calcific tendinitis of left shoulder     Plan: Patient is a 50 year old female who returns for evaluation of left shoulder pain and review of MRI.  MRI of the left shoulder was limited by motion artifact but did reveal complete tear of the long head of the biceps as well as calcific rotator cuff tendinopathy without any rotator cuff tear.  Discussed options available to patient which are somewhat limited by her uncontrolled diabetes with last A1c 12.0.  Cannot do cortisone injection and operative management is not indicated.  After lengthy discussion, patient understands that function of the shoulder after proximal biceps rupture is no different after recovery from the initial inflammation and pain and she will suffer no loss of strength long-term.  Seems that the calcific tendinitis is a cause of her pain given the pain with supraspinatus strength testing on exam  today.  After discussion, patient would like to proceed with Toradol/Marcaine injection.  Toradol and Marcaine mixture was administered into the subacromial space and patient tolerated the procedure well.  Additionally, after the Toradol was administered, ultrasound probe was applied to the lateral shoulder with calcification in the supraspinatus tendon identified.  Small amount of sterile saline was injected around the calcification and aspiration was attempted with only 1 attempt in order to not disturb the rotator cuff tendon as much as possible.  Aspiration failed to yield any calcification, so procedure was aborted and plan to just proceed with Toradol as treatment for now.  She will call the office if her pain returns in the future but otherwise plan to return in 3 months for clinical recheck.  Hopefully her diabetes will be under better control and will have more options to consider in the future.  Patient agreed with plan.  She will continue with the home exercise program for her shoulder in the meantime.  Follow-Up Instructions: No follow-ups on file.   Orders:  Orders Placed This Encounter  Procedures   US Guided Needle Placement - No Linked Charges   No orders of the defined types were placed in this encounter.     Procedures: Large Joint Inj: L subacromial bursa on 11/13/2020 5:12 PM Indications: diagnostic evaluation and pain Details: 18 G 1.5 in needle,  posterior approach  Arthrogram: No  Medications: 9 mL bupivacaine 0.5 %; 5 mL lidocaine 1 % (With 1 mL Toradol) Outcome: tolerated well, no immediate complications Procedure, treatment alternatives, risks and benefits explained, specific risks discussed. Consent was given by the patient. Immediately prior to procedure a time out was called to verify the correct patient, procedure, equipment, support staff and site/side marked as required. Patient was prepped and draped in the usual sterile fashion.    Large Joint Inj: L  subacromial bursa on 11/13/2020 5:13 PM Indications: diagnostic evaluation and pain Details: 18 G 1.5 in needle, lateral approach  Arthrogram: No  Medications: 5 mL lidocaine 1 % (5 mL saline) Outcome: tolerated well, no immediate complications Procedure, treatment alternatives, risks and benefits explained, specific risks discussed. Consent was given by the patient. Immediately prior to procedure a time out was called to verify the correct patient, procedure, equipment, support staff and site/side marked as required. Patient was prepped and draped in the usual sterile fashion.      Clinical Data: No additional findings.  Objective: Vital Signs: There were no vitals taken for this visit.  Physical Exam:  Constitutional: Patient appears well-developed HEENT:  Head: Normocephalic Eyes:EOM are normal Neck: Normal range of motion Cardiovascular: Normal rate Pulmonary/chest: Effort normal Neurologic: Patient is alert Skin: Skin is warm Psychiatric: Patient has normal mood and affect  Ortho Exam: Ortho exam demonstrates left shoulder with 35 degrees external rotation, 85 degrees abduction, 150 degrees forward flexion.  No crepitus noted with passive motion of the shoulder.  Tenderness severely over the bicipital groove with moderate tenderness diffusely through the shoulder especially over the acromion and AC joint.  She has pain with passive range of motion of the shoulder as well as positive Neer and Hawkins impingement signs.  She has 5/5 motor strength of bilateral subscapularis.  5 -/5 strength of supraspinatus and infraspinatus of the left shoulder compared with 5/5 strength on the right; this seems more due to pain than any actual true weakness and patient endorses moderate pain with this especially with supraspinatus strength testing.  Specialty Comments:  No specialty comments available.  Imaging: No results found.   PMFS History: Patient Active Problem List   Diagnosis  Date Noted   Left lumbar radiculopathy 02/22/2019   Trigger finger of left thumb 01/07/2019   Cellulitis and abscess of right leg 01/02/2018   Bilateral carpal tunnel syndrome 07/06/2016   Left elbow pain 02/17/2016   Uncontrolled type 2 diabetes mellitus with hyperglycemia (HCC) 10/29/2015   Uncontrolled type 2 diabetes mellitus without complication, with long-term current use of insulin 10/29/2015   Injury of right hand 05/19/2015   Cervical disc disorder with radiculopathy, unspecified cervical region 10/10/2013   Diabetes (HCC) 04/07/2013   Pyelonephritis 04/06/2013   RLQ abdominal pain 04/06/2013   Fever 09/07/2012   Headache(784.0) 09/07/2012   Cellulitis 08/05/2011   Peripheral edema 08/05/2011   Gastroesophageal reflux disease 08/05/2011   BMI 50.0-59.9, adult (HCC) 08/05/2011   Past Medical History:  Diagnosis Date   Cellulitis 07/2011   Diabetes mellitus    dx 2016   GERD (gastroesophageal reflux disease)     Family History  Problem Relation Age of Onset   Cancer Mother    Diabetes Mother    Hypertension Mother     Past Surgical History:  Procedure Laterality Date   ABLATION     CARPAL TUNNEL RELEASE Left 09/13/2016   Procedure: LEFT CARPAL TUNNEL RELEASE;  Surgeon: Cindee Salt, MD;  Location:  [facility] OR;  Service: Orthopedics;  Laterality: Left;   CHOLECYSTECTOMY     KNEE ARTHROSCOPY     LAPAROSCOPIC TUBAL LIGATION  04/29/2011   Procedure: LAPAROSCOPIC TUBAL LIGATION;  Surgeon: Turner Daniels, MD;  Location: [facility];  Service: Gynecology;  Laterality: Bilateral;  with Filshie Clips   ROTATOR CUFF REPAIR     TUBAL LIGATION     Social History   Occupational History   Not on file  Tobacco Use   Smoking status: Former    Packs/day: 0.50    Years: 2.00    Pack years: 1.00    Types: Cigarettes    Quit date: 07/22/2011    Years since quitting: 9.3   Smokeless tobacco: Never  Vaping Use   Vaping Use: Never used  Substance and Sexual Activity   Alcohol use: Yes     Alcohol/week: 0.0 standard drinks    Comment: occ   Drug use: No   Sexual activity: Not on file

## 2020-11-16 ENCOUNTER — Other Ambulatory Visit: Payer: Self-pay

## 2020-11-16 ENCOUNTER — Emergency Department (HOSPITAL_BASED_OUTPATIENT_CLINIC_OR_DEPARTMENT_OTHER)
Admission: EM | Admit: 2020-11-16 | Discharge: 2020-11-16 | Disposition: A | Discharge status: Home / Self Care | Payer: 59 | Attending: Emergency Medicine | Admitting: Emergency Medicine

## 2020-11-16 ENCOUNTER — Encounter (HOSPITAL_BASED_OUTPATIENT_CLINIC_OR_DEPARTMENT_OTHER): Payer: Self-pay | Admitting: *Deleted

## 2020-11-16 DIAGNOSIS — E119 Type 2 diabetes mellitus without complications: Secondary | ICD-10-CM | POA: Diagnosis not present

## 2020-11-16 DIAGNOSIS — Z794 Long term (current) use of insulin: Secondary | ICD-10-CM | POA: Diagnosis not present

## 2020-11-16 DIAGNOSIS — Z7984 Long term (current) use of oral hypoglycemic drugs: Secondary | ICD-10-CM | POA: Diagnosis not present

## 2020-11-16 DIAGNOSIS — U071 COVID-19: Secondary | ICD-10-CM | POA: Diagnosis not present

## 2020-11-16 DIAGNOSIS — J069 Acute upper respiratory infection, unspecified: Secondary | ICD-10-CM | POA: Diagnosis not present

## 2020-11-16 DIAGNOSIS — Z87891 Personal history of nicotine dependence: Secondary | ICD-10-CM | POA: Diagnosis not present

## 2020-11-16 DIAGNOSIS — Z28310 Unvaccinated for covid-19: Secondary | ICD-10-CM | POA: Insufficient documentation

## 2020-11-16 DIAGNOSIS — R059 Cough, unspecified: Secondary | ICD-10-CM | POA: Diagnosis present

## 2020-11-16 NOTE — ED Triage Notes (Signed)
Covid exposure with sx x 2 days, husband tested positive last week

## 2020-11-16 NOTE — Discharge Instructions (Addendum)
Please check the results of your COVID-19 test on MyChart.  They should be available tomorrow.  If you find they are positive please quarantine based on the current CDC guidelines.  If you develop any new or worsening symptoms please come back to the emergency department.  It was great to meet you.

## 2020-11-16 NOTE — ED Provider Notes (Signed)
[facility]MENT Provider Note   CSN: [id number] Arrival date & time: 11/16/20  1627     History Chief Complaint  Patient presents with   Covid Exposure    [name] is a 50 y.o. female.  HPI Patient is a 50 year old female who presents to the emergency department due to rhinorrhea, cough, lightheadedness, diarrhea that started about 2 days ago.  She has not been vaccinated for COVID-19 and states that her husband was diagnosed with COVID-19 4 days ago.  Patient came to the emergency department today due to her COVID exposure and request COVID-19 testing.    Past Medical History:  Diagnosis Date   Cellulitis 07/2011   Diabetes mellitus    dx 2016   GERD (gastroesophageal reflux disease)     Patient Active Problem List   Diagnosis Date Noted   Left lumbar radiculopathy 02/22/2019   Trigger finger of left thumb 01/07/2019   Cellulitis and abscess of right leg 01/02/2018   Bilateral carpal tunnel syndrome 07/06/2016   Left elbow pain 02/17/2016   Uncontrolled type 2 diabetes mellitus with hyperglycemia ([place]) 10/29/2015   Uncontrolled type 2 diabetes mellitus without complication, with long-term current use of insulin 10/29/2015   Injury of right hand 05/19/2015   Cervical disc disorder with radiculopathy, unspecified cervical region 10/10/2013   Diabetes ([place]) 04/07/2013   Pyelonephritis 04/06/2013   RLQ abdominal pain 04/06/2013   Fever 09/07/2012   Headache(784.0) 09/07/2012   Cellulitis 08/05/2011   Peripheral edema 08/05/2011   Gastroesophageal reflux disease 08/05/2011   BMI 50.0-59.9, adult ([place]) 08/05/2011    Past Surgical History:  Procedure Laterality Date   ABLATION     CARPAL TUNNEL RELEASE Left 09/13/2016   Procedure: LEFT CARPAL TUNNEL RELEASE;  Surgeon: Daryll Brod, MD;  Location: [facility];  Service: Orthopedics;  Laterality: Left;   CHOLECYSTECTOMY     KNEE ARTHROSCOPY     LAPAROSCOPIC TUBAL LIGATION  04/29/2011    Procedure: LAPAROSCOPIC TUBAL LIGATION;  Surgeon: Luz Lex, MD;  Location: [facility];  Service: Gynecology;  Laterality: Bilateral;  with Filshie Clips   ROTATOR CUFF REPAIR     TUBAL LIGATION       OB History   No obstetric history on file.     Family History  Problem Relation Age of Onset   Cancer Mother    Diabetes Mother    Hypertension Mother     Social History   Tobacco Use   Smoking status: Former    Packs/day: 0.50    Years: 2.00    Pack years: 1.00    Types: Cigarettes    Quit date: 07/22/2011    Years since quitting: 9.3   Smokeless tobacco: Never  Vaping Use   Vaping Use: Never used  Substance Use Topics   Alcohol use: Yes    Alcohol/week: 0.0 standard drinks    Comment: occ   Drug use: No    Home Medications Prior to Admission medications   Medication Sig Start Date End Date Taking? Authorizing Provider  traMADol (ULTRAM) 50 MG tablet Take 1 tablet (50 mg total) by mouth every 12 (twelve) hours as needed. 10/12/20 10/12/21  Magnant, Charles L, PA-C  Blood Glucose Monitoring Suppl (TRUE METRIX METER) w/Device KIT Use as directed 11/10/20   Camillia Herter, NP  glimepiride (AMARYL) 1 MG tablet Take 1 tablet (1 mg total) by mouth daily with breakfast. 11/10/20 12/10/20  Camillia Herter, NP  glucose blood (TRUE METRIX BLOOD GLUCOSE  TEST) test strip Use as instructed 11/10/20   Camillia Herter, NP  insulin glargine (LANTUS) 100 UNIT/ML Solostar Pen Inject 10 Units into the skin at bedtime. 11/10/20 12/10/20  Camillia Herter, NP  Insulin Pen Needle (PEN NEEDLES) 31G X 8 MM MISC UAD 11/10/20   Camillia Herter, NP  omeprazole (PRILOSEC) 40 MG capsule Take 40 mg by mouth daily.    [provider]  TRUEplus Lancets 28G MISC Use as directed 11/10/20   Camillia Herter, NP  pantoprazole (PROTONIX) 20 MG tablet Take 1 tablet (20 mg total) by mouth daily. 05/26/17 06/27/20  Malvin Johns, MD    Allergies    Diclofenac, Vancomycin, and Adhesive [tape]  Review of Systems    Review of Systems  Constitutional:  Positive for chills.  HENT:  Positive for congestion and rhinorrhea.   Respiratory:  Positive for cough.   Gastrointestinal:  Positive for diarrhea. Negative for constipation.  Neurological:  Positive for light-headedness. Negative for dizziness.   Physical Exam Updated Vital Signs BP (!) 127/100   Pulse (!) 102   Temp 98.7 F (37.1 C) (Oral)   Resp 16   Ht 5' 1"  (1.549 m)   Wt 128.4 kg   SpO2 97%   BMI 53.47 kg/m   Physical Exam Vitals and nursing note reviewed.  Constitutional:      General: She is not in acute distress.    Appearance: Normal appearance. She is not ill-appearing, toxic-appearing or diaphoretic.  HENT:     Head: Normocephalic and atraumatic.     Right Ear: External ear normal.     Left Ear: External ear normal.     Nose: Nose normal.     Mouth/Throat:     Mouth: Mucous membranes are moist.     Pharynx: Oropharynx is clear. No oropharyngeal exudate or posterior oropharyngeal erythema.  Eyes:     Extraocular Movements: Extraocular movements intact.  Cardiovascular:     Rate and Rhythm: Normal rate and regular rhythm.     Pulses: Normal pulses.     Heart sounds: Normal heart sounds. No murmur heard.   No friction rub. No gallop.  Pulmonary:     Effort: Pulmonary effort is normal. No respiratory distress.     Breath sounds: Normal breath sounds. No stridor. No wheezing, rhonchi or rales.  Abdominal:     General: Abdomen is flat.     Palpations: Abdomen is soft.     Tenderness: There is no abdominal tenderness.  Musculoskeletal:        General: Normal range of motion.     Cervical back: Normal range of motion and neck supple. No tenderness.  Skin:    General: Skin is warm and dry.  Neurological:     General: No focal deficit present.     Mental Status: She is alert and oriented to person, place, and time.  Psychiatric:        Mood and Affect: Mood normal.        Behavior: Behavior normal.    ED Results /  Procedures / Treatments   Labs (all labs ordered are listed, but only abnormal results are displayed) Labs Reviewed  SARS CORONAVIRUS 2 (TAT 6-24 HRS)    EKG None  Radiology No results found.  Procedures Procedures   Medications Ordered in ED Medications - No data to display  ED Course  I have reviewed the triage vital signs and the nursing notes.  Pertinent labs & imaging results that were  available during my care of the patient were reviewed by me and considered in my medical decision making (see chart for details).    MDM Rules/Calculators/A&P                          Patient is a 50 year old female who presents to the emergency department with 2 days of viral URI symptoms.  Patient has not been vaccinated for COVID-19.  Her husband is currently positive for COVID-19.  Her symptoms today are likely due to COVID-19.  Physical exam is reassuring.  Initially mildly tachycardic but this resolved on my exam.  Heart is regular rate and rhythm without murmurs, rubs, or gallops.  Lungs are clear to auscultation bilaterally.  We will obtain a COVID-19 test.  Patient is going to check the results tomorrow on MyChart.  Discussed quarantining based on current CDC guidelines if she finds that she is positive.  Discussed return precautions at length.  Feel that she is stable for discharge at this time and she is agreeable.  Her questions were answered and she was amicable at the time of discharge.  Final Clinical Impression(s) / ED Diagnoses Final diagnoses:  Viral URI with cough   Rx / DC Orders ED Discharge Orders     None        Rayna Sexton, PA-C 11/16/20 1936    Fredia Sorrow, MD 11/19/20 1257

## 2020-11-17 LAB — SARS CORONAVIRUS 2 (TAT 6-24 HRS): SARS Coronavirus 2: POSITIVE — AB

## 2020-11-26 ENCOUNTER — Other Ambulatory Visit: Payer: Self-pay

## 2020-11-26 ENCOUNTER — Telehealth: Payer: Self-pay | Admitting: Family

## 2020-11-26 ENCOUNTER — Ambulatory Visit (INDEPENDENT_AMBULATORY_CARE_PROVIDER_SITE_OTHER): Payer: 59

## 2020-11-26 ENCOUNTER — Encounter: Payer: Self-pay | Admitting: Family

## 2020-11-26 DIAGNOSIS — Z20822 Contact with and (suspected) exposure to covid-19: Secondary | ICD-10-CM

## 2020-11-26 DIAGNOSIS — R6889 Other general symptoms and signs: Secondary | ICD-10-CM

## 2020-11-26 LAB — POCT INFLUENZA A/B
Influenza A, POC: NEGATIVE
Influenza B, POC: NEGATIVE

## 2020-11-26 NOTE — Telephone Encounter (Signed)
.  [name], lenig are scheduled for a virtual visit with your provider today.    Just as we do with appointments in the office, we must obtain your consent to participate.  Your consent will be active for this visit and any virtual visit you may have with one of our providers in the next 365 days.    If you have a MyChart account, I can also send a copy of this consent to you electronically.  All virtual visits are billed to your insurance company just like a traditional visit in the office.  As this is a virtual visit, video technology does not allow for your provider to perform a traditional examination.  This may limit your provider's ability to fully assess your condition.  If your provider identifies any concerns that need to be evaluated in person or the need to arrange testing such as labs, EKG, etc, we will make arrangements to do so.    Although advances in technology are sophisticated, we cannot ensure that it will always work on either your end or our end.  If the connection with a video visit is poor, we may have to switch to a telephone visit.  With either a video or telephone visit, we are not always able to ensure that we have a secure connection.   I need to obtain your verbal consent now.   Are you willing to proceed with your visit today?   [name] has provided verbal consent on 11/26/2020 for a virtual visit (video or telephone).   [name] 11/26/2020  9:38 AM

## 2020-11-26 NOTE — Progress Notes (Signed)
POCT FLU A/B and Covid test completed

## 2020-11-27 LAB — COVID-19, FLU A+B AND RSV
Influenza A, NAA: NOT DETECTED
Influenza B, NAA: NOT DETECTED
RSV, NAA: NOT DETECTED
SARS-CoV-2, NAA: NOT DETECTED

## 2020-12-01 ENCOUNTER — Telehealth: Payer: 59 | Admitting: Family

## 2020-12-22 ENCOUNTER — Emergency Department (HOSPITAL_BASED_OUTPATIENT_CLINIC_OR_DEPARTMENT_OTHER)
Admission: EM | Admit: 2020-12-22 | Discharge: 2020-12-22 | Disposition: A | Discharge status: Home / Self Care | Payer: 59 | Attending: Emergency Medicine | Admitting: Emergency Medicine

## 2020-12-22 ENCOUNTER — Encounter (HOSPITAL_BASED_OUTPATIENT_CLINIC_OR_DEPARTMENT_OTHER): Payer: Self-pay | Admitting: *Deleted

## 2020-12-22 ENCOUNTER — Other Ambulatory Visit: Payer: Self-pay

## 2020-12-22 DIAGNOSIS — Y990 Civilian activity done for income or pay: Secondary | ICD-10-CM | POA: Insufficient documentation

## 2020-12-22 DIAGNOSIS — E119 Type 2 diabetes mellitus without complications: Secondary | ICD-10-CM | POA: Diagnosis not present

## 2020-12-22 DIAGNOSIS — Z87891 Personal history of nicotine dependence: Secondary | ICD-10-CM | POA: Diagnosis not present

## 2020-12-22 DIAGNOSIS — M545 Low back pain, unspecified: Secondary | ICD-10-CM | POA: Diagnosis present

## 2020-12-22 DIAGNOSIS — Z794 Long term (current) use of insulin: Secondary | ICD-10-CM | POA: Diagnosis not present

## 2020-12-22 DIAGNOSIS — W19XXXA Unspecified fall, initial encounter: Secondary | ICD-10-CM | POA: Insufficient documentation

## 2020-12-22 DIAGNOSIS — M5442 Lumbago with sciatica, left side: Secondary | ICD-10-CM | POA: Diagnosis not present

## 2020-12-22 LAB — URINALYSIS, MICROSCOPIC (REFLEX)
RBC / HPF: NONE SEEN RBC/hpf (ref 0–5)
WBC, UA: NONE SEEN WBC/hpf (ref 0–5)

## 2020-12-22 LAB — URINALYSIS, ROUTINE W REFLEX MICROSCOPIC
Bilirubin Urine: NEGATIVE
Glucose, UA: >=500 mg/dL — AB
Hgb urine dipstick: NEGATIVE
Ketones, ur: NEGATIVE mg/dL
Leukocytes,Ua: NEGATIVE
Nitrite: NEGATIVE
Protein, ur: NEGATIVE mg/dL
Specific Gravity, Urine: 1.025 (ref 1.005–1.030)
pH: 6 (ref 5.0–8.0)

## 2020-12-22 MED ORDER — METHOCARBAMOL 500 MG PO TABS
500.0000 mg | ORAL_TABLET | Freq: Once | ORAL | Status: AC
  Administered 2020-12-22: 500 mg via ORAL
  Filled 2020-12-22: qty 1

## 2020-12-22 MED ORDER — LIDOCAINE 5 % EX PTCH: 1.0000 | MEDICATED_PATCH | CUTANEOUS | 0 refills | Status: DC

## 2020-12-22 MED ORDER — LIDOCAINE 5 % EX PTCH
1.0000 | MEDICATED_PATCH | CUTANEOUS | Status: DC
  Administered 2020-12-22: 1 via TRANSDERMAL
  Filled 2020-12-22: qty 1

## 2020-12-22 MED ORDER — METHOCARBAMOL 500 MG PO TABS: 500.0000 mg | ORAL_TABLET | Freq: Two times a day (BID) | ORAL | 0 refills | Status: DC

## 2020-12-22 MED ORDER — KETOROLAC TROMETHAMINE 15 MG/ML IJ SOLN
30.0000 mg | Freq: Once | INTRAMUSCULAR | Status: AC
  Administered 2020-12-22: 30 mg via INTRAMUSCULAR
  Filled 2020-12-22: qty 2

## 2020-12-22 MED ORDER — NAPROXEN 500 MG PO TABS: 500.0000 mg | ORAL_TABLET | Freq: Two times a day (BID) | ORAL | 0 refills | Status: AC

## 2020-12-22 NOTE — ED Provider Notes (Signed)
[facility] EMERGENCY DEPARTMENT Provider Note   CSN: [id number] Arrival date & time: 12/22/20  1747     History Chief Complaint  Patient presents with   Back Pain   Leg Pain    [name] is a 50 y.o. female.  She has a history of diabetes.  Patient presents with 4 days of lower back pain that started after she had a fall into a pit during doing yard work.  She landed hard on her leg and pulled her lower back muscle trying to stop herself from falling.  Since then she has had left lower back pain with shooting pains down her left leg.  She denies any urinary incontinence, urinary retention, bowel incontinence, numbness to lower extremity.  She has not taken anything for the pain.  She denies any fever, chills, flank pain, urinary symptoms.   Back Pain Associated symptoms: leg pain   Associated symptoms: no abdominal pain, no chest pain, no dysuria, no fever, no headaches, no numbness and no weakness   Leg Pain Associated symptoms: back pain   Associated symptoms: no fever       Past Medical History:  Diagnosis Date   Cellulitis 07/2011   Diabetes mellitus    dx 2016   GERD (gastroesophageal reflux disease)     Patient Active Problem List   Diagnosis Date Noted   Left lumbar radiculopathy 02/22/2019   Trigger finger of left thumb 01/07/2019   Cellulitis and abscess of right leg 01/02/2018   Bilateral carpal tunnel syndrome 07/06/2016   Left elbow pain 02/17/2016   Uncontrolled type 2 diabetes mellitus with hyperglycemia ([place]) 10/29/2015   Uncontrolled type 2 diabetes mellitus without complication, with long-term current use of insulin 10/29/2015   Injury of right hand 05/19/2015   Cervical disc disorder with radiculopathy, unspecified cervical region 10/10/2013   Diabetes ([place]) 04/07/2013   Pyelonephritis 04/06/2013   RLQ abdominal pain 04/06/2013   Fever 09/07/2012   Headache(784.0) 09/07/2012   Cellulitis 08/05/2011   Peripheral edema 08/05/2011    Gastroesophageal reflux disease 08/05/2011   BMI 50.0-59.9, adult ([place]) 08/05/2011    Past Surgical History:  Procedure Laterality Date   ABLATION     CARPAL TUNNEL RELEASE Left 09/13/2016   Procedure: LEFT CARPAL TUNNEL RELEASE;  Surgeon: Daryll Brod, MD;  Location: [facility];  Service: Orthopedics;  Laterality: Left;   CHOLECYSTECTOMY     KNEE ARTHROSCOPY     LAPAROSCOPIC TUBAL LIGATION  04/29/2011   Procedure: LAPAROSCOPIC TUBAL LIGATION;  Surgeon: Luz Lex, MD;  Location: [facility];  Service: Gynecology;  Laterality: Bilateral;  with Filshie Clips   ROTATOR CUFF REPAIR     TUBAL LIGATION       OB History   No obstetric history on file.     Family History  Problem Relation Age of Onset   Cancer Mother    Diabetes Mother    Hypertension Mother     Social History   Tobacco Use   Smoking status: Former    Packs/day: 0.50    Years: 2.00    Pack years: 1.00    Types: Cigarettes    Quit date: 07/22/2011    Years since quitting: 9.4   Smokeless tobacco: Never  Vaping Use   Vaping Use: Never used  Substance Use Topics   Alcohol use: Yes    Alcohol/week: 0.0 standard drinks    Comment: occ   Drug use: No    Home Medications Prior to Admission medications  Medication Sig Start Date End Date Taking? Authorizing Provider  lidocaine (LIDODERM) 5 % Place 1 patch onto the skin daily. Remove & Discard patch within 12 hours or as directed by MD 12/22/20  Yes Merland Holness, Adora Fridge, PA-C  methocarbamol (ROBAXIN) 500 MG tablet Take 1 tablet (500 mg total) by mouth 2 (two) times daily. 12/22/20  Yes Lonie Newsham, Adora Fridge, PA-C  naproxen (NAPROSYN) 500 MG tablet Take 1 tablet (500 mg total) by mouth 2 (two) times daily for 21 days. 12/22/20 01/12/21 Yes Delshawn Stech, Adora Fridge, PA-C  traMADol (ULTRAM) 50 MG tablet Take 1 tablet (50 mg total) by mouth every 12 (twelve) hours as needed. 10/12/20 10/12/21  Magnant, Charles L, PA-C  Blood Glucose Monitoring Suppl (TRUE METRIX METER) w/Device KIT Use as  directed 11/10/20   Camillia Herter, NP  glimepiride (AMARYL) 1 MG tablet Take 1 tablet (1 mg total) by mouth daily with breakfast. 11/10/20 12/10/20  Camillia Herter, NP  glucose blood (TRUE METRIX BLOOD GLUCOSE TEST) test strip Use as instructed 11/10/20   Camillia Herter, NP  insulin glargine (LANTUS) 100 UNIT/ML Solostar Pen Inject 10 Units into the skin at bedtime. 11/10/20 12/10/20  Camillia Herter, NP  Insulin Pen Needle (PEN NEEDLES) 31G X 8 MM MISC UAD 11/10/20   Camillia Herter, NP  omeprazole (PRILOSEC) 40 MG capsule Take 40 mg by mouth daily.    [provider]  TRUEplus Lancets 28G MISC Use as directed 11/10/20   Camillia Herter, NP  pantoprazole (PROTONIX) 20 MG tablet Take 1 tablet (20 mg total) by mouth daily. 05/26/17 06/27/20  Malvin Johns, MD    Allergies    Diclofenac, Vancomycin, and Adhesive [tape]  Review of Systems   Review of Systems  Constitutional:  Negative for chills and fever.  HENT:  Negative for congestion and rhinorrhea.   Eyes:  Negative for visual disturbance.  Respiratory:  Negative for cough, chest tightness and shortness of breath.   Cardiovascular:  Negative for chest pain, palpitations and leg swelling.  Gastrointestinal:  Negative for abdominal pain, constipation, diarrhea, nausea and vomiting.  Genitourinary:  Negative for difficulty urinating, dysuria, flank pain and hematuria.  Musculoskeletal:  Positive for back pain.  Skin:  Negative for rash and wound.  Neurological:  Negative for dizziness, syncope, weakness, light-headedness, numbness and headaches.  All other systems reviewed and are negative.  Physical Exam Updated Vital Signs BP 130/62 (BP Location: Right Arm)   Pulse (!) 105   Temp 98.4 F (36.9 C) (Oral)   Resp 19   Ht 5' 1"  (1.549 m)   Wt 128.4 kg   SpO2 98%   BMI 53.49 kg/m   Physical Exam Vitals and nursing note reviewed.  Constitutional:      General: She is not in acute distress.    Appearance: Normal appearance.  She is not ill-appearing, toxic-appearing or diaphoretic.  HENT:     Head: Normocephalic and atraumatic.  Eyes:     General: No scleral icterus.       Right eye: No discharge.        Left eye: No discharge.     Conjunctiva/sclera: Conjunctivae normal.  Pulmonary:     Effort: Pulmonary effort is normal. No respiratory distress.  Musculoskeletal:     Lumbar back: Tenderness present. No swelling, deformity or bony tenderness. Normal range of motion.     Comments: No lumbar midline tenderness.  Diffuse paraspinal tenderness on left side that extends down left leg.  Neurological:     Mental Status: She is alert.  Psychiatric:        Mood and Affect: Mood normal.        Behavior: Behavior normal.    ED Results / Procedures / Treatments   Labs (all labs ordered are listed, but only abnormal results are displayed) Labs Reviewed  URINALYSIS, ROUTINE W REFLEX MICROSCOPIC - Abnormal; Notable for the following components:      Result Value   APPearance HAZY (*)    Glucose, UA >=500 (*)    All other components within normal limits  URINALYSIS, MICROSCOPIC (REFLEX) - Abnormal; Notable for the following components:   Bacteria, UA RARE (*)    All other components within normal limits    EKG None  Radiology No results found.  Procedures Procedures   Medications Ordered in ED Medications  lidocaine (LIDODERM) 5 % 1 patch (1 patch Transdermal Patch Applied 12/22/20 2042)  ketorolac (TORADOL) 15 MG/ML injection 30 mg (30 mg Intramuscular Given 12/22/20 1958)  methocarbamol (ROBAXIN) tablet 500 mg (500 mg Oral Given 12/22/20 2043)    ED Course  I have reviewed the triage vital signs and the nursing notes.  Pertinent labs & imaging results that were available during my care of the patient were reviewed by me and considered in my medical decision making (see chart for details).    MDM Rules/Calculators/A&P                         This is a 50 year old well-appearing female who presents  with lower back pain.  Tachycardic initially.  Paraspinal tenderness noted to palpation on the left side that extends down her left leg.  UA notable for large amount of glucose.  Patient with uncontrolled diabetes that explains increased level.  No sign of infection.  [place] is likely secondary to pain.  Patient was given Robaxin, Toradol, Lidoderm patch while in the ED and tachycardia improved.  Discussed findings with patient and likely diagnosis of lower back musculoskeletal strain.  Patient was given prescription for muscle relaxer, Lidoderm patch.  She can alternate Tylenol and ibuprofen.  She needs to follow-up with her PCP within the next week for further management.   Final Clinical Impression(s) / ED Diagnoses Final diagnoses:  Acute left-sided low back pain with left-sided sciatica    Rx / DC Orders ED Discharge Orders          Ordered    lidocaine (LIDODERM) 5 %  Every 24 hours        12/22/20 1955    naproxen (NAPROSYN) 500 MG tablet  2 times daily        12/22/20 1955    methocarbamol (ROBAXIN) 500 MG tablet  2 times daily        12/22/20 1955             Sheila Oats 12/22/20 2355    Gareth Morgan, MD 12/23/20 2312

## 2020-12-22 NOTE — ED Notes (Signed)
Pt reports her husband, who is at bedside, will be driving her home

## 2020-12-22 NOTE — ED Triage Notes (Signed)
Back and left leg pain x 4 days.

## 2020-12-22 NOTE — ED Notes (Signed)
Pt ambulatory at d/c with independent steady gait 

## 2020-12-22 NOTE — ED Notes (Signed)
ED Provider at bedside. 

## 2020-12-22 NOTE — Discharge Instructions (Addendum)
Your examination today is most concerning for a muscular injury 1. Medications: alternate Naproxen and tylenol for pain control, take all usual home medications as they are prescribed 2. Treatment: rest, ice, elevate and use an ACE wrap or other compressive therapy to decrease swelling. Also drink plenty of fluids and do plenty of gentle stretching and move the affected muscle through its normal range of motion to prevent stiffness. 3. Follow Up: If your symptoms do not improve please follow up with orthopedics/sports medicine or your PCP for discussion of your diagnoses and further evaluation after today's visit; if you do not have a primary care doctor use the resource guide provided to find one; Please return to the ER for worsening symptoms or other concerns.   Please set up appointment with PCP within two weeks for further management.  You may benefit from physical therapy.

## 2020-12-30 ENCOUNTER — Emergency Department (HOSPITAL_COMMUNITY)
Admission: EM | Admit: 2020-12-30 | Discharge: 2020-12-30 | Disposition: A | Discharge status: Home / Self Care | Payer: 59 | Attending: Emergency Medicine | Admitting: Emergency Medicine

## 2020-12-30 ENCOUNTER — Emergency Department (HOSPITAL_COMMUNITY): Payer: 59

## 2020-12-30 ENCOUNTER — Encounter (HOSPITAL_COMMUNITY): Payer: Self-pay | Admitting: Emergency Medicine

## 2020-12-30 DIAGNOSIS — E119 Type 2 diabetes mellitus without complications: Secondary | ICD-10-CM | POA: Insufficient documentation

## 2020-12-30 DIAGNOSIS — Z87891 Personal history of nicotine dependence: Secondary | ICD-10-CM | POA: Insufficient documentation

## 2020-12-30 DIAGNOSIS — Z794 Long term (current) use of insulin: Secondary | ICD-10-CM | POA: Diagnosis not present

## 2020-12-30 DIAGNOSIS — M5442 Lumbago with sciatica, left side: Secondary | ICD-10-CM | POA: Diagnosis not present

## 2020-12-30 DIAGNOSIS — Z7984 Long term (current) use of oral hypoglycemic drugs: Secondary | ICD-10-CM | POA: Diagnosis not present

## 2020-12-30 DIAGNOSIS — M545 Low back pain, unspecified: Secondary | ICD-10-CM | POA: Diagnosis present

## 2020-12-30 MED ORDER — LIDOCAINE 5 % EX PTCH: 1.0000 | MEDICATED_PATCH | CUTANEOUS | 0 refills | Status: DC

## 2020-12-30 MED ORDER — PREDNISONE 10 MG (21) PO TBPK: ORAL_TABLET | Freq: Every day | ORAL | 0 refills | Status: DC

## 2020-12-30 NOTE — ED Triage Notes (Signed)
Patient BIB [facility] for lower back pain with radiation down left leg into foot, seen at [facility] for same and prescribed antiinflammatory that offers minimal relief. Patient reports decrease mobility due to pain, denies recent injury. Patient alert, oriented, and in no apparent distress at this time.

## 2020-12-30 NOTE — ED Provider Notes (Signed)
Emergency Medicine Provider Triage Evaluation Note  [name] , a 50 y.o. female  was evaluated in triage.  Pt complains of left lumbar back pain.  Reports that pain radiates into left lower leg.  Pain started last Tuesday and has been constant since then.  Pain is gotten progressively worse over time.  Patient denies any recent falls, injuries, or heavy lifting.  Patient denies any numbness, weakness, saddle anesthesia, bowel or bladder dysfunction, fever, chills, cancer history.  Review of Systems  Positive: Lumbar back pain Negative:  numbness, weakness, saddle anesthesia, bowel or bladder dysfunction, fever, chills, cancer history.  Physical Exam  BP (!) 147/102 (BP Location: Left Wrist)   Pulse (!) 117   Temp 98.3 F (36.8 C) (Oral)   Resp 20   SpO2 99%  Gen:   Awake, no distress   Resp:  Normal effort  MSK:   Moves extremities without difficulty Other:  No midline tenderness or deformity to cervical, thoracic, or lumbar spine.  Patient has tenderness to left lumbar back.  Sensation intact to bilateral lower extremities.  Patient able to move both lower extremities without difficulty.  Medical Decision Making  Medically screening exam initiated at 12:45 PM.  Appropriate orders placed.  [name] was informed that the remainder of the evaluation will be completed by another provider, this initial triage assessment does not replace that evaluation, and the importance of remaining in the ED until their evaluation is complete.  The patient appears stable so that the remainder of the work up may be completed by another provider.      Haskel Schroeder, PA-C 12/30/20 1247    Blane Ohara, MD 12/31/20 [phone]

## 2020-12-30 NOTE — ED Provider Notes (Signed)
[facility] EMERGENCY DEPARTMENT Provider Note   CSN: [id number] Arrival date & time: 12/30/20  1227     History Chief Complaint  Patient presents with   Back Pain    [name] is a 50 y.o. female with history of diabetes mellitus, left lumbar radiculopathy.  Presents to the emergency department with a chief complaint of left lumbar back pain.  Patient reports that pain has been constant since Tuesday 9/6.  Pain has been unchanged over this time period.  Pain is worse with touch or movement.  Patient has had minimal relief with naproxen, Robaxin and lidocaine patches.  Patient rates pain 10/10 on the pain scale.  Per chart review patient was seen at [facility] on 9/6 patient had urinalysis collected which showed no signs of infection.  Patient was diagnosed with acute left-sided low back pain with left-sided sciatica.  Patient was prescribed Robaxin, naproxen, and lidocaine patch.  Patient denies any numbness, weakness, saddle anesthesia, bowel or bladder dysfunction, fever, chills, dysuria, hematuria, urinary frequency, IV drug use, cancer history, unexpected weight loss, night sweats.   Back Pain Associated symptoms: no abdominal pain, no chest pain, no fever, no headaches and no weakness       Past Medical History:  Diagnosis Date   Cellulitis 07/2011   Diabetes mellitus    dx 2016   GERD (gastroesophageal reflux disease)     Patient Active Problem List   Diagnosis Date Noted   Left lumbar radiculopathy 02/22/2019   Trigger finger of left thumb 01/07/2019   Cellulitis and abscess of right leg 01/02/2018   Bilateral carpal tunnel syndrome 07/06/2016   Left elbow pain 02/17/2016   Uncontrolled type 2 diabetes mellitus with hyperglycemia ([place]) 10/29/2015   Uncontrolled type 2 diabetes mellitus without complication, with long-term current use of insulin 10/29/2015   Injury of right hand 05/19/2015   Cervical disc disorder with  radiculopathy, unspecified cervical region 10/10/2013   Diabetes ([place]) 04/07/2013   Pyelonephritis 04/06/2013   RLQ abdominal pain 04/06/2013   Fever 09/07/2012   Headache(784.0) 09/07/2012   Cellulitis 08/05/2011   Peripheral edema 08/05/2011   Gastroesophageal reflux disease 08/05/2011   BMI 50.0-59.9, adult ([place]) 08/05/2011    Past Surgical History:  Procedure Laterality Date   ABLATION     CARPAL TUNNEL RELEASE Left 09/13/2016   Procedure: LEFT CARPAL TUNNEL RELEASE;  Surgeon: Daryll Brod, MD;  Location: [place];  Service: Orthopedics;  Laterality: Left;   CHOLECYSTECTOMY     KNEE ARTHROSCOPY     LAPAROSCOPIC TUBAL LIGATION  04/29/2011   Procedure: LAPAROSCOPIC TUBAL LIGATION;  Surgeon: Luz Lex, MD;  Location: [facility];  Service: Gynecology;  Laterality: Bilateral;  with Filshie Clips   ROTATOR CUFF REPAIR     TUBAL LIGATION       OB History   No obstetric history on file.     Family History  Problem Relation Age of Onset   Cancer Mother    Diabetes Mother    Hypertension Mother     Social History   Tobacco Use   Smoking status: Former    Packs/day: 0.50    Years: 2.00    Pack years: 1.00    Types: Cigarettes    Quit date: 07/22/2011    Years since quitting: 9.4   Smokeless tobacco: Never  Vaping Use   Vaping Use: Never used  Substance Use Topics   Alcohol use: Yes    Alcohol/week: 0.0 standard drinks  Comment: occ   Drug use: No    Home Medications Prior to Admission medications   Medication Sig Start Date End Date Taking? Authorizing Provider  traMADol (ULTRAM) 50 MG tablet Take 1 tablet (50 mg total) by mouth every 12 (twelve) hours as needed. 10/12/20 10/12/21  Magnant, Charles L, PA-C  Blood Glucose Monitoring Suppl (TRUE METRIX METER) w/Device KIT Use as directed 11/10/20   Camillia Herter, NP  glimepiride (AMARYL) 1 MG tablet Take 1 tablet (1 mg total) by mouth daily with breakfast. 11/10/20 12/10/20  Camillia Herter, NP  glucose blood (TRUE METRIX  BLOOD GLUCOSE TEST) test strip Use as instructed 11/10/20   Camillia Herter, NP  insulin glargine (LANTUS) 100 UNIT/ML Solostar Pen Inject 10 Units into the skin at bedtime. 11/10/20 12/10/20  Camillia Herter, NP  Insulin Pen Needle (PEN NEEDLES) 31G X 8 MM MISC UAD 11/10/20   Camillia Herter, NP  lidocaine (LIDODERM) 5 % Place 1 patch onto the skin daily. Remove & Discard patch within 12 hours or as directed by MD 12/22/20   Loeffler, Adora Fridge, PA-C  methocarbamol (ROBAXIN) 500 MG tablet Take 1 tablet (500 mg total) by mouth 2 (two) times daily. 12/22/20   Loeffler, Adora Fridge, PA-C  naproxen (NAPROSYN) 500 MG tablet Take 1 tablet (500 mg total) by mouth 2 (two) times daily for 21 days. 12/22/20 01/12/21  Loeffler, Adora Fridge, PA-C  omeprazole (PRILOSEC) 40 MG capsule Take 40 mg by mouth daily.    [provider]  TRUEplus Lancets 28G MISC Use as directed 11/10/20   Camillia Herter, NP  pantoprazole (PROTONIX) 20 MG tablet Take 1 tablet (20 mg total) by mouth daily. 05/26/17 06/27/20  Malvin Johns, MD    Allergies    Diclofenac, Vancomycin, and Adhesive [tape]  Review of Systems   Review of Systems  Constitutional:  Negative for chills and fever.  Eyes:  Negative for visual disturbance.  Respiratory:  Negative for shortness of breath.   Cardiovascular:  Negative for chest pain.  Gastrointestinal:  Negative for abdominal pain, nausea and vomiting.  Genitourinary:  Negative for difficulty urinating.  Musculoskeletal:  Positive for back pain. Negative for gait problem and neck pain.  Skin:  Negative for color change and rash.  Neurological:  Negative for dizziness, tremors, syncope, weakness, light-headedness and headaches.  Psychiatric/Behavioral:  Negative for confusion.    Physical Exam Updated Vital Signs BP (!) 147/102 (BP Location: Left Wrist)   Pulse (!) 117   Temp 98.3 F (36.8 C) (Oral)   Resp 20   SpO2 99%   Physical Exam Vitals and nursing note reviewed.  Constitutional:       General: She is not in acute distress.    Appearance: She is not ill-appearing, toxic-appearing or diaphoretic.  HENT:     Head: Normocephalic and atraumatic.  Eyes:     General: No scleral icterus.       Right eye: No discharge.        Left eye: No discharge.     Extraocular Movements: Extraocular movements intact.     Pupils: Pupils are equal, round, and reactive to light.  Cardiovascular:     Rate and Rhythm: Normal rate.  Pulmonary:     Effort: Pulmonary effort is normal.  Musculoskeletal:     Cervical back: Normal range of motion and neck supple. No swelling, edema, deformity, erythema, signs of trauma, lacerations, rigidity, spasms, torticollis, tenderness, bony tenderness or crepitus. No pain with  movement. Normal range of motion.     Thoracic back: No swelling, edema, deformity, signs of trauma, lacerations, spasms, tenderness or bony tenderness.     Lumbar back: Tenderness present. No swelling, edema, deformity, signs of trauma, lacerations, spasms or bony tenderness. Positive left straight leg raise test. Negative right straight leg raise test.     Comments: No midline tenderness or deformity to cervical, thoracic, or lumbar spine.  Tenderness to left lumbar paraspinous muscles.    Skin:    General: Skin is warm and dry.  Neurological:     General: No focal deficit present.     Mental Status: She is alert and oriented to person, place, and time.     GCS: GCS eye subscore is 4. GCS verbal subscore is 5. GCS motor subscore is 6.     Cranial Nerves: No cranial nerve deficit or facial asymmetry.     Sensory: Sensation is intact.     Motor: No weakness, tremor, seizure activity or pronator drift.     Gait: Gait is intact.     Comments: CN II-XII intact, equal grip strength, +5 strength to bilateral upper and lower extremities, sensation to light touch intact to bilateral upper and lower extremities.  Patient able to stand and ambulate with analgesic gait.  Psychiatric:         Behavior: Behavior is cooperative.    ED Results / Procedures / Treatments   Labs (all labs ordered are listed, but only abnormal results are displayed) Labs Reviewed - No data to display   EKG None  Radiology DG Lumbar Spine Complete  Result Date: 12/30/2020 CLINICAL DATA:  Lower back pain radiating into the left leg for the past week. No injury. EXAM: LUMBAR SPINE - COMPLETE 4+ VIEW COMPARISON:  CT abdomen pelvis dated October 14, 2019. FINDINGS: Five lumbar type vertebral bodies. No acute fracture or subluxation. Vertebral body heights are preserved. Chronic bilateral L5 pars defects again noted without significant listhesis. Unchanged mild disc height loss at L5-S1. Mild degenerative changes of the sacroiliac joints. Prior cholecystectomy and tubal ligation. IMPRESSION: 1. No acute osseous abnormality. 2. Unchanged chronic bilateral L5 pars defects without significant listhesis. Electronically Signed   By: Titus Dubin M.D.   On: 12/30/2020 13:36    Procedures Procedures   Medications Ordered in ED Medications - No data to display  ED Course  I have reviewed the triage vital signs and the nursing notes.  Pertinent labs & imaging results that were available during my care of the patient were reviewed by me and considered in my medical decision making (see chart for details).    MDM Rules/Calculators/A&P                           Alert 50 year old female no acute stress, nontoxic-appearing.  Presents to emergency department with chief complaint of left lumbar back pain.  Pain has been constant since 9/6.  Patient was seen in the emergency department on that date and prescribed Robaxin, naproxen, and lidocaine patch.  Patient reports that pain has been constant since then.  Minimal improvement with Robaxin and naproxen.  No midline tenderness or deformity to cervical, thoracic, or lumbar spine.  Patient denies any numbness, weakness, saddle anesthesia, bowel or bladder  dysfunction.  Neurologic exam is reassuring.  Low suspicion for cauda equina syndrome.  Patient denies any fevers or chills, no reported history of IV drug use; low suspicion for epidural abscess at this  time.  X-ray imaging was obtained in triage.  X-ray imaging shows no acute osseous abnormality.  Suspect that patient's pain is musculoskeletal versus sciatica.  We will give patient a short course of oral prednisone.  Patient to continue to use Tylenol, naproxen, lidocaine patch, and heat.  We will have patient follow-up with orthopedic provider.  Patient noted to have elevated heart rate.  This is likely secondary to patient's discomfort.  Patient denies any chest pain or shortness of breath.  In no acute distress at this time.  Discussed results, findings, treatment and follow up. Patient advised of return precautions. Patient verbalized understanding and agreed with plan.   Final Clinical Impression(s) / ED Diagnoses Final diagnoses:  Acute left-sided low back pain with left-sided sciatica    Rx / DC Orders ED Discharge Orders          Ordered    predniSONE (STERAPRED UNI-PAK 21 TAB) 10 MG (21) TBPK tablet  Daily        12/30/20 1625    lidocaine (LIDODERM) 5 %  Every 24 hours        12/30/20 1625             Dyann Ruddle 12/30/20 1627    Carmin Muskrat, MD 12/30/20 1818

## 2020-12-30 NOTE — Discharge Instructions (Addendum)
You came to the emergency department today to be evaluated for your low back pain.  Your physical exam and x-ray imaging were reassuring.  I have given you prescription of prednisone to help with inflammation which may be contributing to your low back pain.  Some common side effects include feelings of extra energy, feeling warm, increased appetite, and stomach upset.  If you are diabetic your sugars may run higher than usual.   Please take Ibuprofen (Advil, motrin) and Tylenol (acetaminophen) to relieve your pain.    You may take up to 600 MG (3 pills) of normal strength ibuprofen every 8 hours as needed.   You make take tylenol, up to 1,000 mg (two extra strength pills) every 8 hours as needed.   It is safe to take ibuprofen and tylenol at the same time as they work differently.   Do not take more than 3,000 mg tylenol in a 24 hour period (not more than one dose every 8 hours.  Please check all medication labels as many medications such as pain and cold medications may contain tylenol.  Do not drink alcohol while taking these medications.  Do not take other NSAID'S while taking ibuprofen (such as aleve or naproxen).  Please take ibuprofen with food to decrease stomach upset.  Get help right away if: You are not able to control when you urinate or have bowel movements (incontinence). You have: Weakness in your lower back, pelvis, buttocks, or legs that gets worse. Redness or swelling of your back. A burning sensation when you urinate.

## 2021-01-11 NOTE — Progress Notes (Signed)
Patient ID: [name], female    DOB: 1970-12-03  MRN: [id number]  CC: Diabetes Follow-Up   Subjective: [name] is a 50 y.o. female who presents for diabetes follow-up.   Her concerns today include:   DIABETES TYPE 2 FOLLOW-UP: 11/10/2020: - Hemoglobin not at goal at 12.8% on 09/04/2020, goal < 7%. Next hemoglobin A1c due August 2022.  - Insulin NPH regular discontinued per patient preference.  - Begin Insulin Glargine and Glimepiride as prescribed.   01/15/2021: Doing well on current regimen. No issues/concerns.  2. BACK PAIN FOLLOW-UP: Visit 12/30/2020 at [facility] per MD note: Alert 50 year old female no acute stress, nontoxic-appearing.  Presents to emergency department with chief complaint of left lumbar back pain.  Pain has been constant since 9/6.  Patient was seen in the emergency department on that date and prescribed Robaxin, naproxen, and lidocaine patch.  Patient reports that pain has been constant since then.  Minimal improvement with Robaxin and naproxen.   No midline tenderness or deformity to cervical, thoracic, or lumbar spine.  Patient denies any numbness, weakness, saddle anesthesia, bowel or bladder dysfunction.  Neurologic exam is reassuring.  Low suspicion for cauda equina syndrome.  Patient denies any fevers or chills, no reported history of IV drug use; low suspicion for epidural abscess at this time.   X-ray imaging was obtained in triage.  X-ray imaging shows no acute osseous abnormality.   Suspect that patient's pain is musculoskeletal versus sciatica. We will give patient a short course of oral prednisone.  Patient to continue to use Tylenol, naproxen, lidocaine patch, and heat. We will have patient follow-up with orthopedic provider.   Patient noted to have elevated heart rate.  This is likely secondary to patient's discomfort.  Patient denies any chest pain or shortness of breath.  In no acute distress at this  time.  9/302022: Left side lower back still hurting. Worse with standing and walking. Better with sitting and leaning to the right side. Reports she completed all medications prescribed at hospital discharge without relief.   Patient Active Problem List   Diagnosis Date Noted   Left lumbar radiculopathy 02/22/2019   Trigger finger of left thumb 01/07/2019   Cellulitis and abscess of right leg 01/02/2018   Bilateral carpal tunnel syndrome 07/06/2016   Left elbow pain 02/17/2016   Uncontrolled type 2 diabetes mellitus with hyperglycemia ([place]) 10/29/2015   Uncontrolled type 2 diabetes mellitus without complication, with long-term current use of insulin 10/29/2015   Injury of right hand 05/19/2015   Cervical disc disorder with radiculopathy, unspecified cervical region 10/10/2013   Diabetes ([place]) 04/07/2013   Pyelonephritis 04/06/2013   RLQ abdominal pain 04/06/2013   Fever 09/07/2012   Headache(784.0) 09/07/2012   Cellulitis 08/05/2011   Peripheral edema 08/05/2011   Gastroesophageal reflux disease 08/05/2011   BMI 50.0-59.9, adult ([place]) 08/05/2011     Current Outpatient Medications on File Prior to Visit  Medication Sig Dispense Refill   traMADol (ULTRAM) 50 MG tablet Take 1 tablet (50 mg total) by mouth every 12 (twelve) hours as needed. 20 tablet 0   Blood Glucose Monitoring Suppl (TRUE METRIX METER) w/Device KIT Use as directed 1 kit 0   glucose blood (TRUE METRIX BLOOD GLUCOSE TEST) test strip Use as instructed 100 each 12   Insulin Pen Needle (PEN NEEDLES) 31G X 8 MM MISC UAD 100 each 0   lidocaine (LIDODERM) 5 % Place 1 patch onto the skin daily. Remove & Discard patch  within 12 hours or as directed by MD 30 patch 0   methocarbamol (ROBAXIN) 500 MG tablet Take 1 tablet (500 mg total) by mouth 2 (two) times daily. 20 tablet 0   omeprazole (PRILOSEC) 40 MG capsule Take 40 mg by mouth daily.     predniSONE (STERAPRED UNI-PAK 21 TAB) 10 MG (21) TBPK tablet Take by mouth daily.  Take 6 tabs by mouth daily  for 2 days, then 5 tabs for 2 days, then 4 tabs for 2 days, then 3 tabs for 2 days, 2 tabs for 2 days, then 1 tab by mouth daily for 2 days 42 tablet 0   TRUEplus Lancets 28G MISC Use as directed 100 each 4   [DISCONTINUED] pantoprazole (PROTONIX) 20 MG tablet Take 1 tablet (20 mg total) by mouth daily. 30 tablet 0   No current facility-administered medications on file prior to visit.    Allergies  Allergen Reactions   Diclofenac Other (See Comments)    "red man syndrome"   Vancomycin Other (See Comments)    Red man syndrome   Adhesive [Tape] Itching and Rash    Social History   Socioeconomic History   Marital status: Married    Spouse name: Not on file   Number of children: Not on file   Years of education: Not on file   Highest education level: Not on file  Occupational History   Not on file  Tobacco Use   Smoking status: Former    Packs/day: 0.50    Years: 2.00    Pack years: 1.00    Types: Cigarettes    Quit date: 07/22/2011    Years since quitting: 9.4   Smokeless tobacco: Never  Vaping Use   Vaping Use: Never used  Substance and Sexual Activity   Alcohol use: Yes    Alcohol/week: 0.0 standard drinks    Comment: occ   Drug use: No   Sexual activity: Not on file  Other Topics Concern   Not on file  Social History Narrative   Not on file   Social Determinants of Health   Financial Resource Strain: Not on file  Food Insecurity: Not on file  Transportation Needs: Not on file  Physical Activity: Not on file  Stress: Not on file  Social Connections: Not on file  Intimate Partner Violence: Not on file    Family History  Problem Relation Age of Onset   Cancer Mother    Diabetes Mother    Hypertension Mother     Past Surgical History:  Procedure Laterality Date   ABLATION     CARPAL TUNNEL RELEASE Left 09/13/2016   Procedure: LEFT CARPAL [place];  Surgeon: Daryll Brod, MD;  Location: [place];  Service: Orthopedics;   Laterality: Left;   CHOLECYSTECTOMY     KNEE ARTHROSCOPY     LAPAROSCOPIC TUBAL LIGATION  04/29/2011   Procedure: LAPAROSCOPIC TUBAL LIGATION;  Surgeon: Luz Lex, MD;  Location: [facility];  Service: Gynecology;  Laterality: Bilateral;  with Filshie Clips   ROTATOR CUFF REPAIR     TUBAL LIGATION      ROS: Review of Systems Negative except as stated above  PHYSICAL EXAM: BP 117/65 (BP Location: Left Arm, Patient Position: Sitting, Cuff Size: Large)   Pulse (!) 104   Temp 98.2 F (36.8 C)   Resp 16   Ht 5' 0.98" (1.549 m)   Wt 264 lb (119.7 kg)   SpO2 98%   BMI 49.91 kg/m   Physical  Exam HENT:     Head: Normocephalic and atraumatic.  Eyes:     Extraocular Movements: Extraocular movements intact.     Conjunctiva/sclera: Conjunctivae normal.     Pupils: Pupils are equal, round, and reactive to light.  Cardiovascular:     Rate and Rhythm: Tachycardia present.     Pulses: Normal pulses.     Heart sounds: Normal heart sounds.  Pulmonary:     Effort: Pulmonary effort is normal.     Breath sounds: Normal breath sounds.  Musculoskeletal:     Cervical back: Normal range of motion and neck supple.  Neurological:     General: No focal deficit present.     Mental Status: She is alert and oriented to person, place, and time.  Psychiatric:        Mood and Affect: Mood normal.        Behavior: Behavior normal.   ASSESSMENT AND PLAN: 1. Type 2 diabetes mellitus without complication, with long-term current use of insulin ([place]): - Hemoglobin A1c today not at goal at 11.4%, goal < 7%. This is slightly improved from previous hemoglobin A1c of 12.8% on 09/04/2020.  - Increase Glimepiride from 1 mg daily to 1 mg twice daily.  - Increase Insulin Glargine from 10 units daily to 13 units daily.  - Begin Metformin XR as prescribed.  - Discussed the importance of healthy eating habits, low-carbohydrate diet, low-sugar diet, regular aerobic exercise (at least 150 minutes a week as tolerated)  and medication compliance to achieve or maintain control of diabetes. - To achieve an A1C goal of less than or equal to 7.0 percent, a fasting blood sugar of 80 to 130 mg/dL and a postprandial glucose (90 to 120 minutes after a meal) less than 180 mg/dL. In the event of sugars less than 60 mg/dl or greater than 400 mg/dl please notify the clinic ASAP. It is recommended that you undergo annual eye exams and annual foot exams. - Follow-up with primary provider in 4 weeks or sooner if needed for repeat hemoglobin A1c. - POCT glycosylated hemoglobin (Hb H9Q) - Basic Metabolic Panel - metFORMIN (GLUCOPHAGE-XR) 500 MG 24 hr tablet; Take 1 tablet (500 mg total) by mouth daily with breakfast.  Dispense: 60 tablet; Refill: 0 - glimepiride (AMARYL) 1 MG tablet; Take 1 tablet (1 mg total) by mouth 2 (two) times daily with a meal.  Dispense: 120 tablet; Refill: 0 - insulin glargine (LANTUS) 100 UNIT/ML Solostar Pen; Inject 13 Units into the skin at bedtime.  Dispense: 3.9 mL; Refill: 1  2. Acute left-sided low back pain with left-sided sciatica: - Begin Gabapentin as prescribed. May cause drowsiness. Counseled patient to not consume if operating heavy machinery or driving. Counseled patient to not consume with alcohol or illicit substances. Patient verbalized understanding.  - Referral to Orthopedic Surgery for further evaluation and management.  - Ambulatory referral to Orthopedic Surgery - gabapentin (NEURONTIN) 100 MG capsule; Take 1 capsule (100 mg total) by mouth at bedtime.  Dispense: 30 capsule; Refill: 0   Patient was given the opportunity to ask questions.  Patient verbalized understanding of the plan and was able to repeat key elements of the plan. Patient was given clear instructions to go to Emergency Department or return to medical center if symptoms don't improve, worsen, or new problems develop.The patient verbalized understanding.   Orders Placed This Encounter  Procedures   Varicella-zoster  vaccine IM   Flu Vaccine QUAD 70moIM (Fluarix, Fluzone & Alfiuria Quad PF)   Basic  Metabolic Panel   Ambulatory referral to Orthopedic Surgery   POCT glycosylated hemoglobin (Hb A1C)     Requested Prescriptions   Signed Prescriptions Disp Refills   gabapentin (NEURONTIN) 100 MG capsule 30 capsule 0    Sig: Take 1 capsule (100 mg total) by mouth at bedtime.   metFORMIN (GLUCOPHAGE-XR) 500 MG 24 hr tablet 60 tablet 0    Sig: Take 1 tablet (500 mg total) by mouth daily with breakfast.   glimepiride (AMARYL) 1 MG tablet 120 tablet 0    Sig: Take 1 tablet (1 mg total) by mouth 2 (two) times daily with a meal.   insulin glargine (LANTUS) 100 UNIT/ML Solostar Pen 3.9 mL 1    Sig: Inject 13 Units into the skin at bedtime.    Return in about 4 weeks (around 02/12/2021) for Follow-Up or next available diabetes .  Camillia Herter, NP

## 2021-01-13 ENCOUNTER — Encounter: Payer: 59 | Admitting: Family

## 2021-01-15 ENCOUNTER — Other Ambulatory Visit: Payer: Self-pay

## 2021-01-15 ENCOUNTER — Encounter: Payer: Self-pay | Admitting: Family

## 2021-01-15 ENCOUNTER — Ambulatory Visit (INDEPENDENT_AMBULATORY_CARE_PROVIDER_SITE_OTHER): Payer: 59 | Admitting: Family

## 2021-01-15 VITALS — BP 117/65 | HR 104 | Temp 98.2°F | Resp 16 | Ht 60.98 in | Wt 264.0 lb

## 2021-01-15 DIAGNOSIS — Z124 Encounter for screening for malignant neoplasm of cervix: Secondary | ICD-10-CM

## 2021-01-15 DIAGNOSIS — M5442 Lumbago with sciatica, left side: Secondary | ICD-10-CM | POA: Diagnosis not present

## 2021-01-15 DIAGNOSIS — Z13228 Encounter for screening for other metabolic disorders: Secondary | ICD-10-CM

## 2021-01-15 DIAGNOSIS — Z794 Long term (current) use of insulin: Secondary | ICD-10-CM

## 2021-01-15 DIAGNOSIS — Z0000 Encounter for general adult medical examination without abnormal findings: Secondary | ICD-10-CM

## 2021-01-15 DIAGNOSIS — E119 Type 2 diabetes mellitus without complications: Secondary | ICD-10-CM

## 2021-01-15 DIAGNOSIS — Z113 Encounter for screening for infections with a predominantly sexual mode of transmission: Secondary | ICD-10-CM

## 2021-01-15 DIAGNOSIS — Z13220 Encounter for screening for lipoid disorders: Secondary | ICD-10-CM

## 2021-01-15 DIAGNOSIS — Z130 Encounter for screening for diseases of the blood and blood-forming organs and certain disorders involving the immune mechanism: Secondary | ICD-10-CM

## 2021-01-15 DIAGNOSIS — Z1231 Encounter for screening mammogram for malignant neoplasm of breast: Secondary | ICD-10-CM

## 2021-01-15 DIAGNOSIS — Z1329 Encounter for screening for other suspected endocrine disorder: Secondary | ICD-10-CM

## 2021-01-15 DIAGNOSIS — Z1159 Encounter for screening for other viral diseases: Secondary | ICD-10-CM

## 2021-01-15 LAB — POCT GLYCOSYLATED HEMOGLOBIN (HGB A1C): Hemoglobin A1C: 11.4 % — AB (ref 4.0–5.6)

## 2021-01-15 MED ORDER — METFORMIN HCL ER 500 MG PO TB24
500.0000 mg | ORAL_TABLET | Freq: Every day | ORAL | 0 refills | Status: DC
Start: 2021-01-15 — End: 2021-03-01

## 2021-01-15 MED ORDER — GABAPENTIN 100 MG PO CAPS: 100.0000 mg | ORAL_CAPSULE | Freq: Every day | ORAL | 0 refills | Status: AC

## 2021-01-15 MED ORDER — INSULIN GLARGINE 100 UNIT/ML SOLOSTAR PEN: 13.0000 [IU] | PEN_INJECTOR | Freq: Every day | SUBCUTANEOUS | 1 refills | Status: DC

## 2021-01-15 MED ORDER — GLIMEPIRIDE 1 MG PO TABS: 1.0000 mg | ORAL_TABLET | Freq: Two times a day (BID) | ORAL | 0 refills | Status: DC

## 2021-01-15 NOTE — Progress Notes (Signed)
Pt presents for diabetes follow-up, declined annual physical today due to sciatica pain

## 2021-01-15 NOTE — Patient Instructions (Signed)
Preventive Care 49-52 Years Old, Female Preventive care refers to lifestyle choices and visits with your health care provider that can promote health and wellness. This includes: A yearly physical exam. This is also called an annual wellness visit. Regular dental and eye exams. Immunizations. Screening for certain conditions. Healthy lifestyle choices, such as: Eating a healthy diet. Getting regular exercise. Not using drugs or products that contain nicotine and tobacco. Limiting alcohol use. What can I expect for my preventive care visit? Physical exam Your health care provider will check your: Height and weight. These may be used to calculate your BMI (body mass index). BMI is a measurement that tells if you are at a healthy weight. Heart rate and blood pressure. Body temperature. Skin for abnormal spots. Counseling Your health care provider may ask you questions about your: Past medical problems. Family's medical history. Alcohol, tobacco, and drug use. Emotional well-being. Home life and relationship well-being. Sexual activity. Diet, exercise, and sleep habits. Work and work Statistician. Access to firearms. Method of birth control. Menstrual cycle. Pregnancy history. What immunizations do I need? Vaccines are usually given at various ages, according to a schedule. Your health care provider will recommend vaccines for you based on your age, medical history, and lifestyle or other factors, such as travel or where you work. What tests do I need? Blood tests Lipid and cholesterol levels. These may be checked every 5 years, or more often if you are over 85 years old. Hepatitis C test. Hepatitis B test. Screening Lung cancer screening. You may have this screening every year starting at age 57 if you have a 30-pack-year history of smoking and currently smoke or have quit within the past 15 years. Colorectal cancer screening. All adults should have this screening starting at  age 19 and continuing until age 60. Your health care provider may recommend screening at age 50 if you are at increased risk. You will have tests every 1-10 years, depending on your results and the type of screening test. Diabetes screening. This is done by checking your blood sugar (glucose) after you have not eaten for a while (fasting). You may have this done every 1-3 years. Mammogram. This may be done every 1-2 years. Talk with your health care provider about when you should start having regular mammograms. This may depend on whether you have a family history of breast cancer. BRCA-related cancer screening. This may be done if you have a family history of breast, ovarian, tubal, or peritoneal cancers. Pelvic exam and Pap test. This may be done every 3 years starting at age 35. Starting at age 71, this may be done every 5 years if you have a Pap test in combination with an HPV test. Other tests STD (sexually transmitted disease) testing, if you are at risk. Bone density scan. This is done to screen for osteoporosis. You may have this scan if you are at high risk for osteoporosis. Talk with your health care provider about your test results, treatment options, and if necessary, the need for more tests. Follow these instructions at home: Eating and drinking  Eat a diet that includes fresh fruits and vegetables, whole grains, lean protein, and low-fat dairy products. Take vitamin and mineral supplements as recommended by your health care provider. Do not drink alcohol if: Your health care provider tells you not to drink. You are pregnant, may be pregnant, or are planning to become pregnant. If you drink alcohol: Limit how much you have to 0-1 drink a day. Be  aware of how much alcohol is in your drink. In the [place] one drink equals one 12 oz bottle of beer (355 mL), one 5 oz glass of wine (148 mL), or one 1 oz glass of hard liquor (44 mL). Lifestyle Take daily care of your teeth and  gums. Brush your teeth every morning and night with fluoride toothpaste. Floss one time each day. Stay active. Exercise for at least 30 minutes 5 or more days each week. Do not use any products that contain nicotine or tobacco, such as cigarettes, e-cigarettes, and chewing tobacco. If you need help quitting, ask your health care provider. Do not use drugs. If you are sexually active, practice safe sex. Use a condom or other form of protection to prevent STIs (sexually transmitted infections). If you do not wish to become pregnant, use a form of birth control. If you plan to become pregnant, see your health care provider for a prepregnancy visit. If told by your health care provider, take low-dose aspirin daily starting at age 85. Find healthy ways to cope with stress, such as: Meditation, yoga, or listening to music. Journaling. Talking to a trusted person. Spending time with friends and family. Safety Always wear your seat belt while driving or riding in a vehicle. Do not drive: If you have been drinking alcohol. Do not ride with someone who has been drinking. When you are tired or distracted. While texting. Wear a helmet and other protective equipment during sports activities. If you have firearms in your house, make sure you follow all gun safety procedures. What's next? Visit your health care provider once a year for an annual wellness visit. Ask your health care provider how often you should have your eyes and teeth checked. Stay up to date on all vaccines. This information is not intended to replace advice given to you by your health care provider. Make sure you discuss any questions you have with your health care provider. Document Revised: 06/12/2020 Document Reviewed: 12/14/2017 Elsevier Patient Education  2022 [employer].

## 2021-02-10 ENCOUNTER — Other Ambulatory Visit: Payer: Self-pay

## 2021-02-10 ENCOUNTER — Ambulatory Visit (INDEPENDENT_AMBULATORY_CARE_PROVIDER_SITE_OTHER): Payer: 59 | Admitting: Orthopaedic Surgery

## 2021-02-10 ENCOUNTER — Encounter: Payer: Self-pay | Admitting: Orthopaedic Surgery

## 2021-02-10 VITALS — BP 131/71 | HR 119 | Ht 60.98 in | Wt 273.4 lb

## 2021-02-10 DIAGNOSIS — M5442 Lumbago with sciatica, left side: Secondary | ICD-10-CM

## 2021-02-10 DIAGNOSIS — M5416 Radiculopathy, lumbar region: Secondary | ICD-10-CM | POA: Diagnosis not present

## 2021-02-10 NOTE — Progress Notes (Signed)
Office Visit Note   Patient: [name]           Date of Birth: 1970-06-18           MRN: [id number] Visit Date: 02/10/2021              Requested by: Rema Fendt, NP [address] [place],  [place] [address] PCP: Rema Fendt, NP   Assessment & Plan: Visit Diagnoses:  1. Acute left-sided low back pain with left-sided sciatica   2. Left lumbar radiculopathy     Plan: Patient with L5 bilateral pars defects mild anterolisthesis and L5 nerve root symptoms.  This is on the left side only we will set her up for some physical therapy and recheck her in 6 weeks.  Pathophysiology discussed x-rays reviewed.  If she has persistent problems we may need to consider diagnostic imaging.  Follow-Up Instructions: No follow-ups on file.   Orders:  Orders Placed This Encounter  Procedures   Ambulatory referral to Physical Therapy   No orders of the defined types were placed in this encounter.     Procedures: No procedures performed   Clinical Data: No additional findings.   Subjective: Chief Complaint  Patient presents with   Lower Back - Pain    HPI 50 year old female with low back pain left side has been present for more than 2 months.  She had previous x-rays 12/30/2020 which showed unchanged chronic bilateral L5 pars defect without significant listhesis.  There is mild disc base loss of height at L5-S1.  Patient's had back pain that radiates down her left leg down to her foot on the dorsum worse with standing.  She does better when she is changing positions.  She has had some tingling.  She is tried Naprosyn, prednisone, muscle relaxants.  She is not ever had epidurals or physical therapy.  No bowel bladder symptoms no fever or chills.  Type 2 diabetes on insulin.  Morbid obesity BMI greater than 50.  History of pyelonephritis.  Review of Systems all the systems noncontributory to HPI.   Objective: Vital Signs: BP 131/71   Pulse (!) 119   Ht 5' 0.98"  (1.549 m)   Wt 273 lb 6.4 oz (124 kg)   BMI 51.69 kg/m   Physical Exam Constitutional:      Appearance: She is well-developed.  HENT:     Head: Normocephalic.     Right Ear: External ear normal.     Left Ear: External ear normal. There is no impacted cerumen.  Eyes:     Pupils: Pupils are equal, round, and reactive to light.  Neck:     Thyroid: No thyromegaly.     Trachea: No tracheal deviation.  Cardiovascular:     Rate and Rhythm: Normal rate.  Pulmonary:     Effort: Pulmonary effort is normal.  Abdominal:     Palpations: Abdomen is soft.  Musculoskeletal:     Cervical back: No rigidity.  Skin:    General: Skin is warm and dry.  Neurological:     Mental Status: She is alert and oriented to person, place, and time.  Psychiatric:        Behavior: Behavior normal.    Ortho Exam patient can heel and toe walk some pain with straight leg raising pain with positive compression.  Tenderness of the lumbosacral junction.  Specialty Comments:  No specialty comments available.  Imaging: No results found.   PMFS History: Patient Active Problem List  Diagnosis Date Noted   Left lumbar radiculopathy 02/22/2019   Trigger finger of left thumb 01/07/2019   Cellulitis and abscess of right leg 01/02/2018   Bilateral carpal tunnel syndrome 07/06/2016   Left elbow pain 02/17/2016   Uncontrolled type 2 diabetes mellitus with hyperglycemia (HCC) 10/29/2015   Uncontrolled type 2 diabetes mellitus without complication, with long-term current use of insulin 10/29/2015   Injury of right hand 05/19/2015   Cervical disc disorder with radiculopathy, unspecified cervical region 10/10/2013   Diabetes (HCC) 04/07/2013   Pyelonephritis 04/06/2013   RLQ abdominal pain 04/06/2013   Fever 09/07/2012   Headache(784.0) 09/07/2012   Cellulitis 08/05/2011   Peripheral edema 08/05/2011   Gastroesophageal reflux disease 08/05/2011   BMI 50.0-59.9, adult (HCC) 08/05/2011   Past Medical History:   Diagnosis Date   Cellulitis 07/2011   Diabetes mellitus    dx 2016   GERD (gastroesophageal reflux disease)     Family History  Problem Relation Age of Onset   Cancer Mother    Diabetes Mother    Hypertension Mother     Past Surgical History:  Procedure Laterality Date   ABLATION     CARPAL TUNNEL RELEASE Left 09/13/2016   Procedure: LEFT CARPAL TUNNEL RELEASE;  Surgeon: Cindee Salt, MD;  Location: [facility];  Service: Orthopedics;  Laterality: Left;   CHOLECYSTECTOMY     KNEE ARTHROSCOPY     LAPAROSCOPIC TUBAL LIGATION  04/29/2011   Procedure: LAPAROSCOPIC TUBAL LIGATION;  Surgeon: Turner Daniels, MD;  Location: [facility];  Service: Gynecology;  Laterality: Bilateral;  with Filshie Clips   ROTATOR CUFF REPAIR     TUBAL LIGATION     Social History   Occupational History   Not on file  Tobacco Use   Smoking status: Former    Packs/day: 0.50    Years: 2.00    Pack years: 1.00    Types: Cigarettes    Quit date: 07/22/2011    Years since quitting: 9.5   Smokeless tobacco: Never  Vaping Use   Vaping Use: Never used  Substance and Sexual Activity   Alcohol use: Yes    Alcohol/week: 0.0 standard drinks    Comment: occ   Drug use: No   Sexual activity: Not on file

## 2021-02-12 NOTE — Progress Notes (Signed)
Erroneous encounter

## 2021-02-16 ENCOUNTER — Encounter: Payer: 59 | Admitting: Family

## 2021-02-16 DIAGNOSIS — E119 Type 2 diabetes mellitus without complications: Secondary | ICD-10-CM

## 2021-02-25 NOTE — Progress Notes (Signed)
Patient ID: [name], female    DOB: November 29, 1970  MRN: [id number]  CC: Diabetes Follow-Up   Subjective: [name] is a 50 y.o. female who presents for diabetes follow-up.   Her concerns today include:   DIABETES TYPE 2 FOLLOW-UP: 01/15/2021: - Hemoglobin A1c today not at goal at 11.4%, goal < 7%. This is slightly improved from previous hemoglobin A1c of 12.8% on 09/04/2020.  - Increase Glimepiride from 1 mg daily to 1 mg twice daily.  - Increase Insulin Glargine from 10 units daily to 13 units daily.  - Begin Metformin XR as prescribed.  - Follow-up with primary provider in 4 weeks or sooner if needed for repeat hemoglobin A1c.  03/01/2021: Patient reports when taking nighttime dose of Glimepiride and Lantus causes blood sugars to drop into the 90's. Doesn't feel well when this happens and sweating during that time. Otherwise doing well on current regimen. Monitoring what she eats. Not exercising. Reports has increased energy.   2. WORK NOTE: Followed by [facility] for left-sided low back pain with left-sided sciatica and left lumbar radiculopathy. 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 Reports standing makes her back pain worse. Requesting work note to provide to employer so that she will be able to sit during her entire shift.  Patient Active Problem List   Diagnosis Date Noted   Left lumbar radiculopathy 02/22/2019   Trigger finger of left thumb 01/07/2019   Cellulitis and abscess of right leg 01/02/2018   Bilateral carpal tunnel syndrome 07/06/2016   Left elbow pain 02/17/2016   Uncontrolled type 2 diabetes mellitus with hyperglycemia ([place]) 10/29/2015   Uncontrolled type 2 diabetes mellitus without complication, with long-term current use of insulin 10/29/2015   Injury of right hand 05/19/2015   Cervical disc disorder with radiculopathy, unspecified cervical region 10/10/2013   Diabetes ([place]) 04/07/2013   Pyelonephritis 04/06/2013   RLQ  abdominal pain 04/06/2013   Fever 09/07/2012   Headache(784.0) 09/07/2012   Cellulitis 08/05/2011   Peripheral edema 08/05/2011   Gastroesophageal reflux disease 08/05/2011   BMI 50.0-59.9, adult ([place]) 08/05/2011     Current Outpatient Medications on File Prior to Visit  Medication Sig Dispense Refill   traMADol (ULTRAM) 50 MG tablet Take 1 tablet (50 mg total) by mouth every 12 (twelve) hours as needed. 20 tablet 0   Blood Glucose Monitoring Suppl (TRUE METRIX METER) w/Device KIT Use as directed 1 kit 0   gabapentin (NEURONTIN) 100 MG capsule Take 1 capsule (100 mg total) by mouth at bedtime. 30 capsule 0   glucose blood (TRUE METRIX BLOOD GLUCOSE TEST) test strip Use as instructed 100 each 12   lidocaine (LIDODERM) 5 % Place 1 patch onto the skin daily. Remove & Discard patch within 12 hours or as directed by MD 30 patch 0   methocarbamol (ROBAXIN) 500 MG tablet Take 1 tablet (500 mg total) by mouth 2 (two) times daily. 20 tablet 0   omeprazole (PRILOSEC) 40 MG capsule Take 40 mg by mouth daily.     predniSONE (STERAPRED UNI-PAK 21 TAB) 10 MG (21) TBPK tablet Take by mouth daily. Take 6 tabs by mouth daily  for 2 days, then 5 tabs for 2 days, then 4 tabs for 2 days, then 3 tabs for 2 days, 2 tabs for 2 days, then 1 tab by mouth daily for 2 days 42 tablet 0   TRUEplus Lancets 28G MISC Use as directed 100 each 4   [DISCONTINUED] pantoprazole (  PROTONIX) 20 MG tablet Take 1 tablet (20 mg total) by mouth daily. 30 tablet 0   No current facility-administered medications on file prior to visit.    Allergies  Allergen Reactions   Diclofenac Other (See Comments)    "red man syndrome"   Vancomycin Other (See Comments)    Red man syndrome   Adhesive [Tape] Itching and Rash    Social History   Socioeconomic History   Marital status: Married    Spouse name: Not on file   Number of children: Not on file   Years of education: Not on file   Highest education level: Not on file   Occupational History   Not on file  Tobacco Use   Smoking status: Former    Packs/day: 0.50    Years: 2.00    Pack years: 1.00    Types: Cigarettes    Quit date: 07/22/2011    Years since quitting: 9.6   Smokeless tobacco: Never  Vaping Use   Vaping Use: Never used  Substance and Sexual Activity   Alcohol use: Yes    Alcohol/week: 0.0 standard drinks    Comment: occ   Drug use: No   Sexual activity: Not on file  Other Topics Concern   Not on file  Social History Narrative   Not on file   Social Determinants of Health   Financial Resource Strain: Not on file  Food Insecurity: Not on file  Transportation Needs: Not on file  Physical Activity: Not on file  Stress: Not on file  Social Connections: Not on file  Intimate Partner Violence: Not on file    Family History  Problem Relation Age of Onset   Cancer Mother    Diabetes Mother    Hypertension Mother     Past Surgical History:  Procedure Laterality Date   ABLATION     CARPAL TUNNEL RELEASE Left 09/13/2016   Procedure: LEFT CARPAL [place];  Surgeon: Daryll Brod, MD;  Location: [place] [facility];  Service: Orthopedics;  Laterality: Left;   CHOLECYSTECTOMY     KNEE ARTHROSCOPY     LAPAROSCOPIC TUBAL LIGATION  04/29/2011   Procedure: LAPAROSCOPIC TUBAL LIGATION;  Surgeon: Luz Lex, MD;  Location: [facility];  Service: Gynecology;  Laterality: Bilateral;  with Filshie Clips   ROTATOR CUFF REPAIR     TUBAL LIGATION      ROS: Review of Systems Negative except as stated above  PHYSICAL EXAM: BP 121/83 (BP Location: Left Arm, Patient Position: Sitting, Cuff Size: Large)   Pulse (!) 103   Temp 98.5 F (36.9 C)   Resp 18   Ht 5' 0.98" (1.549 m)   Wt 271 lb 3.2 oz (123 kg)   SpO2 97%   BMI 51.27 kg/m   Physical Exam HENT:     Head: Normocephalic and atraumatic.  Eyes:     Extraocular Movements: Extraocular movements intact.     Conjunctiva/sclera: Conjunctivae normal.     Pupils: Pupils are equal, round, and  reactive to light.  Cardiovascular:     Rate and Rhythm: Tachycardia present.     Pulses: Normal pulses.     Heart sounds: Normal heart sounds.  Pulmonary:     Effort: Pulmonary effort is normal.     Breath sounds: Normal breath sounds.  Musculoskeletal:     Cervical back: Normal range of motion and neck supple.  Neurological:     General: No focal deficit present.     Mental Status: She is alert  and oriented to person, place, and time.  Psychiatric:        Mood and Affect: Mood normal.        Behavior: Behavior normal.   Results for orders placed or performed in visit on 75/17/00  Basic Metabolic Panel  Result Value Ref Range   Glucose 112 (H) 70 - 99 mg/dL   BUN 13 6 - 24 mg/dL   Creatinine, Ser 0.71 0.57 - 1.00 mg/dL   eGFR 102 >59 mL/min/1.73   BUN/Creatinine Ratio 18 9 - 23   Sodium 140 134 - 144 mmol/L   Potassium 4.6 3.5 - 5.2 mmol/L   Chloride 101 96 - 106 mmol/L   CO2 25 20 - 29 mmol/L   Calcium 10.0 8.7 - 10.2 mg/dL  POCT glycosylated hemoglobin (Hb A1C)  Result Value Ref Range   Hemoglobin A1C 9.1 (A) 4.0 - 5.6 %   HbA1c POC (<> result, manual entry)     HbA1c, POC (prediabetic range)     HbA1c, POC (controlled diabetic range)      ASSESSMENT AND PLAN: 1. Type 2 diabetes mellitus without complication, with long-term current use of insulin ([place]): - Hemoglobin A1c today not at goal at 9.1%, goal < 7%. However, this is improved from previous 11.4% on 01/15/2021. - Continue Insulin Glargine as prescribed.  - Continue Glimepiride as prescribed. Counseled to take first dose at 7 am and second dose at 3 pm in order to prevent taking with Lantus at bedtime which seemed to cause side effect of hypoglycemia.  - Increase Metformin from 500 mg daily to 500 mg twice daily.  - Discussed the importance of healthy eating habits, low-carbohydrate diet, low-sugar diet, regular aerobic exercise (at least 150 minutes a week as tolerated) and medication compliance to achieve or  maintain control of diabetes. - BMP to evaluate kidney function and electrolyte balance. - Follow-up with primary provider in 4 weeks or sooner if needed. - POCT glycosylated hemoglobin (Hb F7C) - Basic Metabolic Panel - metFORMIN (GLUCOPHAGE-XR) 500 MG 24 hr tablet; Take 1 tablet (500 mg total) by mouth 2 (two) times daily with a meal.  Dispense: 180 tablet; Refill: 0 - glimepiride (AMARYL) 1 MG tablet; Take 1 tablet (1 mg total) by mouth 2 (two) times daily with a meal.  Dispense: 180 tablet; Refill: 0 - insulin glargine (LANTUS) 100 UNIT/ML Solostar Pen; Inject 13 Units into the skin at bedtime.  Dispense: 12 mL; Refill: 0 - Insulin Pen Needle (PEN NEEDLES) 31G X 8 MM MISC; UAD  Dispense: 100 each; Refill: 0  2. Acute left-sided low back pain with left-sided sciatica: 3. Left lumbar radiculopathy: - Per patient request provided with work note limiting restrictions to being allowed to sit for entire shift.  - Keep all scheduled appointments with [facility]. - Follow-up with primary provider as scheduled.   4. Need for influenza vaccination: - Administered today in office.  - Flu Vaccine QUAD 59moIM (Fluarix, Fluzone & Alfiuria Quad PF)  5. Need for shingles vaccine: - Administered today in office.  - Varicella-zoster vaccine IM    Patient was given the opportunity to ask questions.  Patient verbalized understanding of the plan and was able to repeat key elements of the plan. Patient was given clear instructions to go to Emergency Department or return to medical center if symptoms don't improve, worsen, or new problems develop.The patient verbalized understanding.   Orders Placed This Encounter  Procedures   Varicella-zoster vaccine IM   Flu  Vaccine QUAD 8moIM (Fluarix, Fluzone & Alfiuria Quad PF)   Basic Metabolic Panel   POCT glycosylated hemoglobin (Hb A1C)     Requested Prescriptions   Signed Prescriptions Disp Refills   metFORMIN (GLUCOPHAGE-XR) 500 MG  24 hr tablet 180 tablet 0    Sig: Take 1 tablet (500 mg total) by mouth 2 (two) times daily with a meal.   glimepiride (AMARYL) 1 MG tablet 180 tablet 0    Sig: Take 1 tablet (1 mg total) by mouth 2 (two) times daily with a meal.   insulin glargine (LANTUS) 100 UNIT/ML Solostar Pen 12 mL 0    Sig: Inject 13 Units into the skin at bedtime.   Insulin Pen Needle (PEN NEEDLES) 31G X 8 MM MISC 100 each 0    Sig: UAD    Return in about 4 weeks (around 03/29/2021) for Follow-Up or next available diabetes .  ACamillia Herter NP

## 2021-03-01 ENCOUNTER — Ambulatory Visit (INDEPENDENT_AMBULATORY_CARE_PROVIDER_SITE_OTHER): Payer: 59 | Admitting: Family

## 2021-03-01 ENCOUNTER — Other Ambulatory Visit: Payer: Self-pay

## 2021-03-01 VITALS — BP 121/83 | HR 103 | Temp 98.5°F | Resp 18 | Ht 60.98 in | Wt 271.2 lb

## 2021-03-01 DIAGNOSIS — M5416 Radiculopathy, lumbar region: Secondary | ICD-10-CM

## 2021-03-01 DIAGNOSIS — Z794 Long term (current) use of insulin: Secondary | ICD-10-CM | POA: Diagnosis not present

## 2021-03-01 DIAGNOSIS — Z23 Encounter for immunization: Secondary | ICD-10-CM

## 2021-03-01 DIAGNOSIS — E119 Type 2 diabetes mellitus without complications: Secondary | ICD-10-CM | POA: Diagnosis not present

## 2021-03-01 DIAGNOSIS — M5442 Lumbago with sciatica, left side: Secondary | ICD-10-CM

## 2021-03-01 LAB — POCT GLYCOSYLATED HEMOGLOBIN (HGB A1C): Hemoglobin A1C: 9.1 % — AB (ref 4.0–5.6)

## 2021-03-01 MED ORDER — METFORMIN HCL ER 500 MG PO TB24: 500.0000 mg | ORAL_TABLET | Freq: Two times a day (BID) | ORAL | 0 refills | Status: DC

## 2021-03-01 MED ORDER — PEN NEEDLES 31G X 8 MM MISC: 0 refills | Status: AC

## 2021-03-01 MED ORDER — GLIMEPIRIDE 1 MG PO TABS: 1.0000 mg | ORAL_TABLET | Freq: Two times a day (BID) | ORAL | 0 refills | Status: DC

## 2021-03-01 MED ORDER — INSULIN GLARGINE 100 UNIT/ML SOLOSTAR PEN: 13.0000 [IU] | PEN_INJECTOR | Freq: Every day | SUBCUTANEOUS | 0 refills | Status: DC

## 2021-03-01 NOTE — Progress Notes (Signed)
Diabetes discussed in office.

## 2021-03-01 NOTE — Progress Notes (Signed)
Pt presents for diabetes follow-up  

## 2021-03-02 ENCOUNTER — Telehealth: Payer: Self-pay

## 2021-03-02 LAB — BASIC METABOLIC PANEL
BUN/Creatinine Ratio: 18 (ref 9–23)
BUN: 13 mg/dL (ref 6–24)
CO2: 25 mmol/L (ref 20–29)
Calcium: 10 mg/dL (ref 8.7–10.2)
Chloride: 101 mmol/L (ref 96–106)
Creatinine, Ser: 0.71 mg/dL (ref 0.57–1.00)
Glucose: 112 mg/dL — ABNORMAL HIGH (ref 70–99)
Potassium: 4.6 mmol/L (ref 3.5–5.2)
Sodium: 140 mmol/L (ref 134–144)
eGFR: 102 mL/min/{1.73_m2} (ref >59)

## 2021-03-02 NOTE — Telephone Encounter (Signed)
Called Pt phone rung but no Vm to leave message.

## 2021-03-02 NOTE — Progress Notes (Signed)
Kidney function normal

## 2021-03-02 NOTE — Telephone Encounter (Signed)
-----   Message from Guy Franco, RN sent at 03/02/2021  3:13 PM EST -----  ----- Message ----- From: Rema Fendt, NP Sent: 03/02/2021   7:28 AM EST To: Margorie John, CMA  Kidney function normal.

## 2021-04-01 ENCOUNTER — Other Ambulatory Visit: Payer: Self-pay

## 2021-04-01 ENCOUNTER — Emergency Department (HOSPITAL_BASED_OUTPATIENT_CLINIC_OR_DEPARTMENT_OTHER): Payer: 59

## 2021-04-01 ENCOUNTER — Encounter (HOSPITAL_BASED_OUTPATIENT_CLINIC_OR_DEPARTMENT_OTHER): Payer: Self-pay

## 2021-04-01 ENCOUNTER — Emergency Department (HOSPITAL_BASED_OUTPATIENT_CLINIC_OR_DEPARTMENT_OTHER)
Admission: EM | Admit: 2021-04-01 | Discharge: 2021-04-02 | Disposition: A | Discharge status: Home / Self Care | Payer: 59 | Attending: Emergency Medicine | Admitting: Emergency Medicine

## 2021-04-01 DIAGNOSIS — R059 Cough, unspecified: Secondary | ICD-10-CM | POA: Diagnosis not present

## 2021-04-01 DIAGNOSIS — Z87891 Personal history of nicotine dependence: Secondary | ICD-10-CM | POA: Diagnosis not present

## 2021-04-01 DIAGNOSIS — Z7984 Long term (current) use of oral hypoglycemic drugs: Secondary | ICD-10-CM | POA: Diagnosis not present

## 2021-04-01 DIAGNOSIS — R062 Wheezing: Secondary | ICD-10-CM | POA: Diagnosis not present

## 2021-04-01 DIAGNOSIS — R1010 Upper abdominal pain, unspecified: Secondary | ICD-10-CM | POA: Insufficient documentation

## 2021-04-01 DIAGNOSIS — J189 Pneumonia, unspecified organism: Secondary | ICD-10-CM

## 2021-04-01 DIAGNOSIS — R0602 Shortness of breath: Secondary | ICD-10-CM | POA: Insufficient documentation

## 2021-04-01 DIAGNOSIS — Z20822 Contact with and (suspected) exposure to covid-19: Secondary | ICD-10-CM | POA: Insufficient documentation

## 2021-04-01 DIAGNOSIS — E119 Type 2 diabetes mellitus without complications: Secondary | ICD-10-CM | POA: Insufficient documentation

## 2021-04-01 DIAGNOSIS — R112 Nausea with vomiting, unspecified: Secondary | ICD-10-CM | POA: Insufficient documentation

## 2021-04-01 LAB — RESP PANEL BY RT-PCR (FLU A&B, COVID) ARPGX2
Influenza A by PCR: NEGATIVE
Influenza B by PCR: NEGATIVE
SARS Coronavirus 2 by RT PCR: NEGATIVE

## 2021-04-01 MED ORDER — ACETAMINOPHEN 325 MG PO TABS
650.0000 mg | ORAL_TABLET | Freq: Once | ORAL | Status: AC
  Administered 2021-04-02: 650 mg via ORAL
  Filled 2021-04-01: qty 2

## 2021-04-01 MED ORDER — IPRATROPIUM-ALBUTEROL 0.5-2.5 (3) MG/3ML IN SOLN
3.0000 mL | Freq: Once | RESPIRATORY_TRACT | Status: AC
  Administered 2021-04-02: 3 mL via RESPIRATORY_TRACT
  Filled 2021-04-01: qty 3

## 2021-04-01 NOTE — ED Triage Notes (Addendum)
Pt c/o flu like sx, n/v x 4 days-NAD-steady gait

## 2021-04-02 LAB — CBC WITH DIFFERENTIAL/PLATELET
Abs Immature Granulocytes: 0.04 10*3/uL (ref 0.00–0.07)
Basophils Absolute: 0 10*3/uL (ref 0.0–0.1)
Basophils Relative: 0 %
Eosinophils Absolute: 0.1 10*3/uL (ref 0.0–0.5)
Eosinophils Relative: 1 %
HCT: 41 % (ref 36.0–46.0)
Hemoglobin: 13.8 g/dL (ref 12.0–15.0)
Immature Granulocytes: 1 %
Lymphocytes Relative: 31 %
Lymphs Abs: 2.5 10*3/uL (ref 0.7–4.0)
MCH: 28 pg (ref 26.0–34.0)
MCHC: 33.7 g/dL (ref 30.0–36.0)
MCV: 83.3 fL (ref 80.0–100.0)
Monocytes Absolute: 0.6 10*3/uL (ref 0.1–1.0)
Monocytes Relative: 8 %
Neutro Abs: 4.9 10*3/uL (ref 1.7–7.7)
Neutrophils Relative %: 59 %
Platelets: 390 10*3/uL (ref 150–400)
RBC: 4.92 MIL/uL (ref 3.87–5.11)
RDW: 13.2 % (ref 11.5–15.5)
Smear Review: NORMAL
WBC Morphology: ABNORMAL
WBC: 8.1 10*3/uL (ref 4.0–10.5)
nRBC: 0 % (ref 0.0–0.2)

## 2021-04-02 LAB — COMPREHENSIVE METABOLIC PANEL
ALT: 19 U/L (ref 0–44)
AST: 24 U/L (ref 15–41)
Albumin: 3 g/dL — ABNORMAL LOW (ref 3.5–5.0)
Alkaline Phosphatase: 73 U/L (ref 38–126)
Anion gap: 9 (ref 5–15)
BUN: 10 mg/dL (ref 6–20)
CO2: 26 mmol/L (ref 22–32)
Calcium: 8.5 mg/dL — ABNORMAL LOW (ref 8.9–10.3)
Chloride: 100 mmol/L (ref 98–111)
Creatinine, Ser: 0.74 mg/dL (ref 0.44–1.00)
GFR, Estimated: >60 mL/min (ref >60)
Glucose, Bld: 193 mg/dL — ABNORMAL HIGH (ref 70–99)
Potassium: 3.5 mmol/L (ref 3.5–5.1)
Sodium: 135 mmol/L (ref 135–145)
Total Bilirubin: 0.4 mg/dL (ref 0.3–1.2)
Total Protein: 7.3 g/dL (ref 6.5–8.1)

## 2021-04-02 LAB — LIPASE, BLOOD: Lipase: 54 U/L — ABNORMAL HIGH (ref 11–51)

## 2021-04-02 MED ORDER — DOXYCYCLINE HYCLATE 100 MG PO TABS
100.0000 mg | ORAL_TABLET | Freq: Once | ORAL | Status: AC
  Administered 2021-04-02: 100 mg via ORAL
  Filled 2021-04-02: qty 1

## 2021-04-02 MED ORDER — ONDANSETRON 8 MG PO TBDP: 8.0000 mg | ORAL_TABLET | Freq: Three times a day (TID) | ORAL | 0 refills | Status: DC | PRN

## 2021-04-02 MED ORDER — ALBUTEROL SULFATE HFA 108 (90 BASE) MCG/ACT IN AERS
2.0000 | INHALATION_SPRAY | RESPIRATORY_TRACT | Status: DC | PRN
  Administered 2021-04-02: 2 via RESPIRATORY_TRACT
  Filled 2021-04-02: qty 6.7

## 2021-04-02 MED ORDER — SODIUM CHLORIDE 0.9 % IV SOLN
1.0000 g | Freq: Once | INTRAVENOUS | Status: AC
  Administered 2021-04-02: 1 g via INTRAVENOUS
  Filled 2021-04-02: qty 10

## 2021-04-02 MED ORDER — DOXYCYCLINE HYCLATE 100 MG PO CAPS: 100.0000 mg | ORAL_CAPSULE | Freq: Two times a day (BID) | ORAL | 0 refills | Status: DC

## 2021-04-02 NOTE — ED Provider Notes (Signed)
[facility] Provider Note   CSN: [id number] Arrival date & time: 04/01/21  2113     History Chief Complaint  Patient presents with   Cough    [name] is a 50 y.o. female with past medical history of diabetes, obesity, hypertension who presents with 4 days of shortness of breath, cough, feeling of chills, nausea, vomiting.  Patient denies recent sick contacts.  Patient denies diarrhea.  Patient does endorse some upper abdominal pain.  Patient denies any dysuria, hematuria.  Reports that she no longer has menstrual cycle secondary to ablation.  Patient denies chest pain.  Patient denies history of ACS, tobacco smoking, although she is a former smoker.  She reports her cough is wet in nature, productive of some sputum.   Cough Associated symptoms: shortness of breath       Past Medical History:  Diagnosis Date   Cellulitis 07/2011   Diabetes mellitus    dx 2016   GERD (gastroesophageal reflux disease)     Patient Active Problem List   Diagnosis Date Noted   Left lumbar radiculopathy 02/22/2019   Trigger finger of left thumb 01/07/2019   Cellulitis and abscess of right leg 01/02/2018   Bilateral carpal tunnel syndrome 07/06/2016   Left elbow pain 02/17/2016   Uncontrolled type 2 diabetes mellitus with hyperglycemia ([place]) 10/29/2015   Uncontrolled type 2 diabetes mellitus without complication, with long-term current use of insulin 10/29/2015   Injury of right hand 05/19/2015   Cervical disc disorder with radiculopathy, unspecified cervical region 10/10/2013   Diabetes ([place]) 04/07/2013   Pyelonephritis 04/06/2013   RLQ abdominal pain 04/06/2013   Fever 09/07/2012   Headache(784.0) 09/07/2012   Cellulitis 08/05/2011   Peripheral edema 08/05/2011   Gastroesophageal reflux disease 08/05/2011   BMI 50.0-59.9, adult ([place]) 08/05/2011    Past Surgical History:  Procedure Laterality Date   ABLATION     CARPAL TUNNEL RELEASE Left  09/13/2016   Procedure: LEFT CARPAL TUNNEL RELEASE;  Surgeon: Daryll Brod, MD;  Location: [facility];  Service: Orthopedics;  Laterality: Left;   CHOLECYSTECTOMY     KNEE ARTHROSCOPY     LAPAROSCOPIC TUBAL LIGATION  04/29/2011   Procedure: LAPAROSCOPIC TUBAL LIGATION;  Surgeon: Luz Lex, MD;  Location: [facility];  Service: Gynecology;  Laterality: Bilateral;  with Filshie Clips   ROTATOR CUFF REPAIR     TUBAL LIGATION       OB History   No obstetric history on file.     Family History  Problem Relation Age of Onset   Cancer Mother    Diabetes Mother    Hypertension Mother     Social History   Tobacco Use   Smoking status: Former    Packs/day: 0.50    Years: 2.00    Pack years: 1.00    Types: Cigarettes    Quit date: 07/22/2011    Years since quitting: 9.7   Smokeless tobacco: Never  Vaping Use   Vaping Use: Never used  Substance Use Topics   Alcohol use: Yes    Alcohol/week: 0.0 standard drinks    Comment: occ   Drug use: No    Home Medications Prior to Admission medications   Medication Sig Start Date End Date Taking? Authorizing Provider  traMADol (ULTRAM) 50 MG tablet Take 1 tablet (50 mg total) by mouth every 12 (twelve) hours as needed. 10/12/20 10/12/21  Magnant, Charles L, PA-C  Blood Glucose Monitoring Suppl (TRUE METRIX METER) w/Device KIT Use as  directed 11/10/20   Camillia Herter, NP  gabapentin (NEURONTIN) 100 MG capsule Take 1 capsule (100 mg total) by mouth at bedtime. 01/15/21 02/14/21  Camillia Herter, NP  glimepiride (AMARYL) 1 MG tablet Take 1 tablet (1 mg total) by mouth 2 (two) times daily with a meal. 03/01/21 05/30/21  Camillia Herter, NP  glucose blood (TRUE METRIX BLOOD GLUCOSE TEST) test strip Use as instructed 11/10/20   Camillia Herter, NP  insulin glargine (LANTUS) 100 UNIT/ML Solostar Pen Inject 13 Units into the skin at bedtime. 03/01/21 05/30/21  Camillia Herter, NP  Insulin Pen Needle (PEN NEEDLES) 31G X 8 MM MISC UAD 03/01/21   Camillia Herter, NP   lidocaine (LIDODERM) 5 % Place 1 patch onto the skin daily. Remove & Discard patch within 12 hours or as directed by MD 12/30/20   Loni Beckwith, PA-C  metFORMIN (GLUCOPHAGE-XR) 500 MG 24 hr tablet Take 1 tablet (500 mg total) by mouth 2 (two) times daily with a meal. 03/01/21 05/30/21  Camillia Herter, NP  methocarbamol (ROBAXIN) 500 MG tablet Take 1 tablet (500 mg total) by mouth 2 (two) times daily. 12/22/20   Loeffler, Adora Fridge, PA-C  omeprazole (PRILOSEC) 40 MG capsule Take 40 mg by mouth daily.    [provider]  predniSONE (STERAPRED UNI-PAK 21 TAB) 10 MG (21) TBPK tablet Take by mouth daily. Take 6 tabs by mouth daily  for 2 days, then 5 tabs for 2 days, then 4 tabs for 2 days, then 3 tabs for 2 days, 2 tabs for 2 days, then 1 tab by mouth daily for 2 days 12/30/20   Loni Beckwith, PA-C  TRUEplus Lancets 28G MISC Use as directed 11/10/20   Camillia Herter, NP  pantoprazole (PROTONIX) 20 MG tablet Take 1 tablet (20 mg total) by mouth daily. 05/26/17 06/27/20  Malvin Johns, MD    Allergies    Diclofenac, Vancomycin, and Adhesive [tape]  Review of Systems   Review of Systems  Respiratory:  Positive for cough and shortness of breath.   Gastrointestinal:  Positive for abdominal pain, nausea and vomiting.  All other systems reviewed and are negative.  Physical Exam Updated Vital Signs BP 106/70    Pulse 98    Temp 98 F (36.7 C) (Oral)    Resp (!) 22    Ht 5' 1"  (1.549 m)    Wt 122 kg    SpO2 95%    BMI 50.83 kg/m   Physical Exam Vitals and nursing note reviewed.  Constitutional:      General: She is not in acute distress.    Appearance: Normal appearance.  HENT:     Head: Normocephalic and atraumatic.  Eyes:     General:        Right eye: No discharge.        Left eye: No discharge.  Cardiovascular:     Rate and Rhythm: Normal rate and regular rhythm.     Heart sounds: No murmur heard.   No friction rub. No gallop.  Pulmonary:     Effort: Pulmonary effort  is normal. No respiratory distress.     Breath sounds: Wheezing and rhonchi present.     Comments: Patient with diffuse biphasic wheezing, crackles at lung bases without clear focal consolidation.  Patient with some increased work of breathing.  Rhonchi clear with cough to some extent. Abdominal:     General: Bowel sounds are normal.  Palpations: Abdomen is soft.     Comments: Diffuse nonfocal tenderness to palpation epigastric region.  No rebound, rigidity, guarding.  Musculoskeletal:     Right lower leg: Edema present.     Left lower leg: Edema present.     Comments: At least 2+ edema bilateral lower extremities.  Skin:    General: Skin is warm and dry.     Capillary Refill: Capillary refill takes less than 2 seconds.  Neurological:     Mental Status: She is alert and oriented to person, place, and time.  Psychiatric:        Mood and Affect: Mood normal.        Behavior: Behavior normal.    ED Results / Procedures / Treatments   Labs (all labs ordered are listed, but only abnormal results are displayed) Labs Reviewed  RESP PANEL BY RT-PCR (FLU A&B, COVID) ARPGX2  CBC WITH DIFFERENTIAL/PLATELET  COMPREHENSIVE METABOLIC PANEL  LIPASE, BLOOD    EKG EKG Interpretation  Date/Time:  Thursday April 01 2021 23:47:47 EST Ventricular Rate:  94 PR Interval:  152 QRS Duration: 76 QT Interval:  348 QTC Calculation: 436 R Axis:   54 Text Interpretation: Sinus rhythm Normal ECG Confirmed by Shanon Rosser [phone]) on 04/01/2021 11:59:26 PM  Radiology DG Chest 2 View  Result Date: 04/01/2021 CLINICAL DATA:  Flu-like illness , nausea and vomiting. EXAM: CHEST - 2 VIEW COMPARISON:  PA and lateral 04/04/2015 FINDINGS: Heart size, vasculature and mediastinal contours are unremarkable. There is increased bronchial thickening consistent with bronchitis with patchy increased opacities in the lingula of the left upper lobe partially silhouetting the left heart border. The remainder of the  lungs are clear. No pleural effusion is seen. The thoracic cage intact with thoracic spondylosis. IMPRESSION: Bronchitis, with evidence of lingular pneumonic infiltrate. Clinical correlation and radiographic follow-up recommended. Electronically Signed   By: Telford Nab M.D.   On: 04/01/2021 23:44    Procedures Procedures   Medications Ordered in ED Medications  ipratropium-albuterol (DUONEB) 0.5-2.5 (3) MG/3ML nebulizer solution 3 mL (3 mLs Nebulization Given 04/02/21 0009)  acetaminophen (TYLENOL) tablet 650 mg (650 mg Oral Given 04/02/21 0000)    ED Course  I have reviewed the triage vital signs and the nursing notes.  Pertinent labs & imaging results that were available during my care of the patient were reviewed by me and considered in my medical decision making (see chart for details).    MDM Rules/Calculators/A&P                         Somewhat ill-appearing patient with transient tachycardia at arrival, with normal heart rate during my exam who presents with 4 days of nausea, vomiting, difficulty breathing, cough.  Given increased work of breathing, rhonchi, and wheezing as well as patient's risk factor for worsening disease including obesity, poorly controlled diabetes, hypertension recommend further evaluation with screening lab work, chest x-ray at this time.  Patient does endorse some abdominal tenderness, however exam is nonfocal, do not believe that she has an acute abdomen, or surgical abdomen at this time.  Patient denies current nausea, declines Zofran at this time.  We will administer DuoNeb.  Chest x-ray shows evidence of pneumonia infiltrate in lingula of left lung.  CBC unremarkable, normal white count.  At this time wd are administering Rocephin, doxycycline, and breathing treatment.  12:32 AM Care of [name] transferred to Dr. Florina Ou at the end of my shift as the patient  will require reassessment once labs/imaging have resulted. Patient presentation, ED  course, and plan of care discussed with review of all pertinent labs and imaging. Please see his/her note for further details regarding further ED course and disposition. Plan at time of handoff is treat for pneumonia, that will be appropriate for treatment in an outpatient setting likely based on initial vitals and lab work.  Patient is pending complete lab work at this time. This may be altered or completely changed at the discretion of the oncoming team pending results of further workup.  Final Clinical Impression(s) / ED Diagnoses Final diagnoses:  None    Rx / DC Orders ED Discharge Orders     None        [name] 04/02/21 0033    Molpus, Jenny Reichmann, MD 04/02/21 [phone]

## 2021-04-05 NOTE — Progress Notes (Signed)
Patient ID: [name], female    DOB: [birth year]  MRN: [id number]  CC: Diabetes Type 2 Follow-Up  Subjective: [name] is a 50 y.o. female who presents for diabetes follow-up.   Her concerns today include:   DIABETES TYPE 2 FOLLOW-UP: 03/01/2021: - Hemoglobin A1c today not at goal at 9.1%, goal < 7%. However, this is improved from previous 11.4% on 01/15/2021. - Continue Insulin Glargine as prescribed.  - Continue Glimepiride as prescribed.   04/06/2021: Doing well on current regimen. Has not taken insulin since having pneumonia related to decreased appetite.   2. HOSPITAL DISCHARGE FOLLOW-UP: 04/01/2021 - 04/02/2021 per MD note: Somewhat ill-appearing patient with transient tachycardia at arrival, with normal heart rate during my exam who presents with 4 days of nausea, vomiting, difficulty breathing, cough.  Given increased work of breathing, rhonchi, and wheezing as well as patient's risk factor for worsening disease including obesity, poorly controlled diabetes, hypertension recommend further evaluation with screening lab work, chest x-ray at this time.  Patient does endorse some abdominal tenderness, however exam is nonfocal, do not believe that she has an acute abdomen, or surgical abdomen at this time.   Patient denies current nausea, declines Zofran at this time.  We will administer DuoNeb.  Chest x-ray shows evidence of pneumonia infiltrate in lingula of left lung.  CBC unremarkable, normal white count.  At this time wd are administering Rocephin, doxycycline, and breathing treatment.   12:32 AM Care of Tyneka Scafidi transferred to Dr. Florina Ou at the end of my shift as the patient will require reassessment once labs/imaging have resulted. Patient presentation, ED course, and plan of care discussed with review of all pertinent labs and imaging. Please see his/her note for further details regarding further ED course and disposition. Plan at time of handoff is treat  for pneumonia, that will be appropriate for treatment in an outpatient setting likely based on initial vitals and lab work.  Patient is pending complete lab work at this time. This may be altered or completely changed at the discretion of the oncoming team pending results of further workup.  04/06/2021: Reports beginning to feel worse since hospital discharge. Reports chest pain when taking a deep breath, diarrhea, hot and cold flashes, nausea, feeling weak and tired, and cough. Denies fever. Still taking Doxycycline and Zofran. Taking Albuterol inhaler makes her jittery. Reports plans to return to the hospital later today after her husband gets off of work.  Patient Active Problem List   Diagnosis Date Noted   Left lumbar radiculopathy 02/22/2019   Trigger finger of left thumb 01/07/2019   Cellulitis and abscess of right leg 01/02/2018   Bilateral carpal tunnel syndrome 07/06/2016   Left elbow pain 02/17/2016   Uncontrolled type 2 diabetes mellitus with hyperglycemia ([place]) 10/29/2015   Uncontrolled type 2 diabetes mellitus without complication, with long-term current use of insulin 10/29/2015   Injury of right hand 05/19/2015   Cervical disc disorder with radiculopathy, unspecified cervical region 10/10/2013   Diabetes ([place]) 04/07/2013   Pyelonephritis 04/06/2013   RLQ abdominal pain 04/06/2013   Fever 09/07/2012   Headache(784.0) 09/07/2012   Cellulitis 08/05/2011   Peripheral edema 08/05/2011   Gastroesophageal reflux disease 08/05/2011   BMI 50.0-59.9, adult ([place]) 08/05/2011     Current Outpatient Medications on File Prior to Visit  Medication Sig Dispense Refill   traMADol (ULTRAM) 50 MG tablet Take 1 tablet (50 mg total) by mouth every 12 (twelve) hours as needed. 20 tablet 0  Blood Glucose Monitoring Suppl (TRUE METRIX METER) w/Device KIT Use as directed 1 kit 0   doxycycline (VIBRAMYCIN) 100 MG capsule Take 1 capsule (100 mg total) by mouth 2 (two) times daily. One po bid x 7  days 14 capsule 0   gabapentin (NEURONTIN) 100 MG capsule Take 1 capsule (100 mg total) by mouth at bedtime. 30 capsule 0   glimepiride (AMARYL) 1 MG tablet Take 1 tablet (1 mg total) by mouth 2 (two) times daily with a meal. 180 tablet 0   glucose blood (TRUE METRIX BLOOD GLUCOSE TEST) test strip Use as instructed 100 each 12   Insulin Pen Needle (PEN NEEDLES) 31G X 8 MM MISC UAD 100 each 0   lidocaine (LIDODERM) 5 % Place 1 patch onto the skin daily. Remove & Discard patch within 12 hours or as directed by MD 30 patch 0   omeprazole (PRILOSEC) 40 MG capsule Take 40 mg by mouth daily.     ondansetron (ZOFRAN-ODT) 8 MG disintegrating tablet Take 1 tablet (8 mg total) by mouth every 8 (eight) hours as needed for nausea or vomiting. 10 tablet 0   TRUEplus Lancets 28G MISC Use as directed 100 each 4   [DISCONTINUED] pantoprazole (PROTONIX) 20 MG tablet Take 1 tablet (20 mg total) by mouth daily. 30 tablet 0   No current facility-administered medications on file prior to visit.    Allergies  Allergen Reactions   Diclofenac Other (See Comments)    "red man syndrome"   Vancomycin Other (See Comments)    Red man syndrome   Adhesive [Tape] Itching and Rash    Social History   Socioeconomic History   Marital status: Married    Spouse name: Not on file   Number of children: Not on file   Years of education: Not on file   Highest education level: Not on file  Occupational History   Not on file  Tobacco Use   Smoking status: Former    Packs/day: 0.50    Years: 2.00    Pack years: 1.00    Types: Cigarettes    Quit date: 07/22/2011    Years since quitting: 9.7   Smokeless tobacco: Never  Vaping Use   Vaping Use: Never used  Substance and Sexual Activity   Alcohol use: Yes    Alcohol/week: 0.0 standard drinks    Comment: occ   Drug use: No   Sexual activity: Not on file  Other Topics Concern   Not on file  Social History Narrative   Not on file   Social Determinants of Health    Financial Resource Strain: Not on file  Food Insecurity: Not on file  Transportation Needs: Not on file  Physical Activity: Not on file  Stress: Not on file  Social Connections: Not on file  Intimate Partner Violence: Not on file    Family History  Problem Relation Age of Onset   Cancer Mother    Diabetes Mother    Hypertension Mother     Past Surgical History:  Procedure Laterality Date   ABLATION     CARPAL TUNNEL RELEASE Left 09/13/2016   Procedure: LEFT CARPAL [place];  Surgeon: Daryll Brod, MD;  Location: [facility] ;  Service: Orthopedics;  Laterality: Left;   CHOLECYSTECTOMY     KNEE ARTHROSCOPY     LAPAROSCOPIC TUBAL LIGATION  04/29/2011   Procedure: LAPAROSCOPIC TUBAL LIGATION;  Surgeon: Luz Lex, MD;  Location: [facility];  Service: Gynecology;  Laterality: Bilateral;  with  Filshie Clips   ROTATOR CUFF REPAIR     TUBAL LIGATION      ROS: Review of Systems Negative except as stated above  PHYSICAL EXAM: BP 121/69 (BP Location: Left Arm, Patient Position: Sitting, Cuff Size: Large)    Pulse 97    Temp 98.3 F (36.8 C)    Resp 18    Ht 5' 0.98" (1.549 m)    Wt 268 lb (121.6 kg)    SpO2 93%    BMI 50.66 kg/m   Physical Exam HENT:     Head: Normocephalic and atraumatic.  Eyes:     Extraocular Movements: Extraocular movements intact.     Conjunctiva/sclera: Conjunctivae normal.     Pupils: Pupils are equal, round, and reactive to light.  Cardiovascular:     Rate and Rhythm: Normal rate and regular rhythm.     Pulses: Normal pulses.     Heart sounds: Normal heart sounds.  Pulmonary:     Effort: Pulmonary effort is normal.     Breath sounds: Normal breath sounds.  Musculoskeletal:     Cervical back: Normal range of motion and neck supple.  Neurological:     General: No focal deficit present.     Mental Status: She is alert and oriented to person, place, and time.  Psychiatric:        Mood and Affect: Mood normal.        Behavior: Behavior normal.     ASSESSMENT AND PLAN: 1. Type 2 diabetes mellitus without complication, with long-term current use of insulin ([place]): - Increase Metformin XR from 500 mg twice daily to 1000 mg twice daily.  - Increase Insulin Glargine from 13 units daily to 16 units daily.  - Continue Glimepiride as prescribed. No refills needed as of present.  - Follow-up with primary provider in 4 weeks or sooner if needed.  - metFORMIN (GLUCOPHAGE-XR) 500 MG 24 hr tablet; Take 2 tablets (1,000 mg total) by mouth 2 (two) times daily with a meal.  Dispense: 360 tablet; Refill: 0 - insulin glargine (LANTUS) 100 UNIT/ML Solostar Pen; Inject 16 Units into the skin at bedtime.  Dispense: 15 mL; Refill: 0  2. Hospital discharge follow-up: 3. Lingular pneumonia: - Patient today in office without cardiopulmonary distressed.  - Counseled to continue Doxycycline and Ondansetron as prescribed. Refill of Doxycycline. - Encouraged to report to the Emergency Department immediately for worsening symptoms, patient verbalized understanding. Patient reports plans to return to the Emergency Department later today.  - doxycycline (VIBRAMYCIN) 100 MG capsule; Take 1 capsule (100 mg total) by mouth 2 (two) times daily. One po bid x 7 days  Dispense: 14 capsule; Refill: 0  Patient was given the opportunity to ask questions.  Patient verbalized understanding of the plan and was able to repeat key elements of the plan. Patient was given clear instructions to go to Emergency Department or return to medical center if symptoms don't improve, worsen, or new problems develop.The patient verbalized understanding.   Requested Prescriptions   Signed Prescriptions Disp Refills   metFORMIN (GLUCOPHAGE-XR) 500 MG 24 hr tablet 360 tablet 0    Sig: Take 2 tablets (1,000 mg total) by mouth 2 (two) times daily with a meal.   insulin glargine (LANTUS) 100 UNIT/ML Solostar Pen 15 mL 0    Sig: Inject 16 Units into the skin at bedtime.    Return in about 4  weeks (around 05/04/2021) for Follow-Up or next available diabetes .  Camillia Herter, NP

## 2021-04-05 NOTE — Progress Notes (Deleted)
Patient ID: [name], female    DOB: Oct 04, 1970  MRN: [id number]  CC: Diabetes Follow-Up   Subjective: [name] is a 50 y.o. female who presents for diabetes follow-up.   Her concerns today include:   DIABETES TYPE 2 FOLLOW-UP: 03/01/2021: - Hemoglobin A1c today not at goal at 9.1%, goal < 7%. However, this is improved from previous 11.4% on 01/15/2021. - Continue Insulin Glargine as prescribed.  - Continue Glimepiride as prescribed. Counseled to take first dose at 7 am and second dose at 3 pm in order to prevent taking with Lantus at bedtime which seemed to cause side effect of hypoglycemia.  - Increase Metformin from 500 mg daily to 500 mg twice daily.   04/13/2021:   2. HOSPITAL DISCHARGE FOLLOW-UP: 04/01/2021 at [facility] per MD note: Somewhat ill-appearing patient with transient tachycardia at arrival, with normal heart rate during my exam who presents with 4 days of nausea, vomiting, difficulty breathing, cough.  Given increased work of breathing, rhonchi, and wheezing as well as patient's risk factor for worsening disease including obesity, poorly controlled diabetes, hypertension recommend further evaluation with screening lab work, chest x-ray at this time.  Patient does endorse some abdominal tenderness, however exam is nonfocal, do not believe that she has an acute abdomen, or surgical abdomen at this time.   Patient denies current nausea, declines Zofran at this time.  We will administer DuoNeb.  Chest x-ray shows evidence of pneumonia infiltrate in lingula of left lung.  CBC unremarkable, normal white count.  At this time wd are administering Rocephin, doxycycline, and breathing treatment.   12:32 AM Care of [name] transferred to Dr. Florina Ou at the end of my shift as the patient will require reassessment once labs/imaging have resulted. Patient presentation, ED course, and plan of care discussed with review of all  pertinent labs and imaging. Please see his/her note for further details regarding further ED course and disposition. Plan at time of handoff is treat for pneumonia, that will be appropriate for treatment in an outpatient setting likely based on initial vitals and lab work.  Patient is pending complete lab work at this time. This may be altered or completely changed at the discretion of the oncoming team pending results of further workup.  04/13/2021: nausea, vomiting, difficulty breathing, cough. Finished Doxy   Patient Active Problem List   Diagnosis Date Noted   Left lumbar radiculopathy 02/22/2019   Trigger finger of left thumb 01/07/2019   Cellulitis and abscess of right leg 01/02/2018   Bilateral carpal tunnel syndrome 07/06/2016   Left elbow pain 02/17/2016   Uncontrolled type 2 diabetes mellitus with hyperglycemia ([place]) 10/29/2015   Uncontrolled type 2 diabetes mellitus without complication, with long-term current use of insulin 10/29/2015   Injury of right hand 05/19/2015   Cervical disc disorder with radiculopathy, unspecified cervical region 10/10/2013   Diabetes ([place]) 04/07/2013   Pyelonephritis 04/06/2013   RLQ abdominal pain 04/06/2013   Fever 09/07/2012   Headache(784.0) 09/07/2012   Cellulitis 08/05/2011   Peripheral edema 08/05/2011   Gastroesophageal reflux disease 08/05/2011   BMI 50.0-59.9, adult ([place]) 08/05/2011     Current Outpatient Medications on File Prior to Visit  Medication Sig Dispense Refill   traMADol (ULTRAM) 50 MG tablet Take 1 tablet (50 mg total) by mouth every 12 (twelve) hours as needed. 20 tablet 0   Blood Glucose Monitoring Suppl (TRUE METRIX METER) w/Device KIT Use as directed 1 kit 0   doxycycline (  VIBRAMYCIN) 100 MG capsule Take 1 capsule (100 mg total) by mouth 2 (two) times daily. One po bid x 7 days 14 capsule 0   gabapentin (NEURONTIN) 100 MG capsule Take 1 capsule (100 mg total) by mouth at bedtime. 30 capsule 0   glimepiride (AMARYL) 1  MG tablet Take 1 tablet (1 mg total) by mouth 2 (two) times daily with a meal. 180 tablet 0   glucose blood (TRUE METRIX BLOOD GLUCOSE TEST) test strip Use as instructed 100 each 12   insulin glargine (LANTUS) 100 UNIT/ML Solostar Pen Inject 13 Units into the skin at bedtime. 12 mL 0   Insulin Pen Needle (PEN NEEDLES) 31G X 8 MM MISC UAD 100 each 0   lidocaine (LIDODERM) 5 % Place 1 patch onto the skin daily. Remove & Discard patch within 12 hours or as directed by MD 30 patch 0   metFORMIN (GLUCOPHAGE-XR) 500 MG 24 hr tablet Take 1 tablet (500 mg total) by mouth 2 (two) times daily with a meal. 180 tablet 0   omeprazole (PRILOSEC) 40 MG capsule Take 40 mg by mouth daily.     ondansetron (ZOFRAN-ODT) 8 MG disintegrating tablet Take 1 tablet (8 mg total) by mouth every 8 (eight) hours as needed for nausea or vomiting. 10 tablet 0   TRUEplus Lancets 28G MISC Use as directed 100 each 4   [DISCONTINUED] pantoprazole (PROTONIX) 20 MG tablet Take 1 tablet (20 mg total) by mouth daily. 30 tablet 0   No current facility-administered medications on file prior to visit.    Allergies  Allergen Reactions   Diclofenac Other (See Comments)    "red man syndrome"   Vancomycin Other (See Comments)    Red man syndrome   Adhesive [Tape] Itching and Rash    Social History   Socioeconomic History   Marital status: Married    Spouse name: Not on file   Number of children: Not on file   Years of education: Not on file   Highest education level: Not on file  Occupational History   Not on file  Tobacco Use   Smoking status: Former    Packs/day: 0.50    Years: 2.00    Pack years: 1.00    Types: Cigarettes    Quit date: 07/22/2011    Years since quitting: 9.7   Smokeless tobacco: Never  Vaping Use   Vaping Use: Never used  Substance and Sexual Activity   Alcohol use: Yes    Alcohol/week: 0.0 standard drinks    Comment: occ   Drug use: No   Sexual activity: Not on file  Other Topics Concern    Not on file  Social History Narrative   Not on file   Social Determinants of Health   Financial Resource Strain: Not on file  Food Insecurity: Not on file  Transportation Needs: Not on file  Physical Activity: Not on file  Stress: Not on file  Social Connections: Not on file  Intimate Partner Violence: Not on file    Family History  Problem Relation Age of Onset   Cancer Mother    Diabetes Mother    Hypertension Mother     Past Surgical History:  Procedure Laterality Date   ABLATION     CARPAL TUNNEL RELEASE Left 09/13/2016   Procedure: LEFT CARPAL [place];  Surgeon: Daryll Brod, MD;  Location: [place];  Service: Orthopedics;  Laterality: Left;   CHOLECYSTECTOMY     KNEE ARTHROSCOPY  LAPAROSCOPIC TUBAL LIGATION  04/29/2011   Procedure: LAPAROSCOPIC TUBAL LIGATION;  Surgeon: Luz Lex, MD;  Location: [facility];  Service: Gynecology;  Laterality: Bilateral;  with Filshie Clips   ROTATOR CUFF REPAIR     TUBAL LIGATION      ROS: Review of Systems Negative except as stated above  PHYSICAL EXAM: There were no vitals taken for this visit.  Physical Exam  {female adult master:[id number]} {female adult master:[id number]}  CMP Latest Ref Rng & Units 04/01/2021 03/01/2021 10/17/2019  Glucose 70 - 99 mg/dL 193(H) 112(H) 217(H)  BUN 6 - 20 mg/dL _0 Creatinine 0.44 - 1.00 mg/dL 0.74 0.71 0.74  Sodium 135 - 145 mmol/L 135 140 139  Potassium 3.5 - 5.1 mmol/L 3.5 4.6 3.7  Chloride 98 - 111 mmol/L 100 101 102  CO2 22 - 32 mmol/L _1 Calcium 8.9 - 10.3 mg/dL 8.5(L) 10.0 8.7(L)  Total Protein 6.5 - 8.1 g/dL 7.3 - 7.2  Total Bilirubin 0.3 - 1.2 mg/dL 0.4 - 0.6  Alkaline Phos 38 - 126 U/L 73 - 87  AST 15 - 41 U/L 24 - 16  ALT 0 - 44 U/L 19 - 20   Lipid Panel     Component Value Date/Time   CHOL  12/30/2008 0525    144        ATP III CLASSIFICATION:  <200     mg/dL   Desirable  200-239  mg/dL   Borderline High  >=240    mg/dL   High          TRIG 147 12/30/2008  0525   HDL 32 (L) 12/30/2008 0525   CHOLHDL 4.5 12/30/2008 0525   VLDL 29 12/30/2008 0525   LDLCALC  12/30/2008 0525    83        Total Cholesterol/HDL:CHD Risk Coronary Heart Disease Risk Table                     Men   Women  1/2 Average Risk   3.4   3.3  Average Risk       5.0   4.4  2 X Average Risk   9.6   7.1  3 X Average Risk  23.4   11.0        Use the calculated Patient Ratio above and the CHD Risk Table to determine the patient's CHD Risk.        ATP III CLASSIFICATION (LDL):  <100     mg/dL   Optimal  100-129  mg/dL   Near or Above                    Optimal  130-159  mg/dL   Borderline  160-189  mg/dL   High  >190     mg/dL   Very High    CBC    Component Value Date/Time   WBC 8.1 04/01/2021 2359   RBC 4.92 04/01/2021 2359   HGB 13.8 04/01/2021 2359   HCT 41.0 04/01/2021 2359   PLT 390 04/01/2021 2359   MCV 83.3 04/01/2021 2359   MCH 28.0 04/01/2021 2359   MCHC 33.7 04/01/2021 2359   RDW 13.2 04/01/2021 2359   LYMPHSABS 2.5 04/01/2021 2359   MONOABS 0.6 04/01/2021 2359   EOSABS 0.1 04/01/2021 2359   BASOSABS 0.0 04/01/2021 2359    ASSESSMENT AND PLAN:  There are no diagnoses linked to this encounter.   Patient was given the opportunity  to ask questions.  Patient verbalized understanding of the plan and was able to repeat key elements of the plan. Patient was given clear instructions to go to Emergency Department or return to medical center if symptoms don't improve, worsen, or new problems develop.The patient verbalized understanding.   No orders of the defined types were placed in this encounter.    Requested Prescriptions    No prescriptions requested or ordered in this encounter    No follow-ups on file.  Camillia Herter, NP

## 2021-04-06 ENCOUNTER — Other Ambulatory Visit: Payer: Self-pay

## 2021-04-06 ENCOUNTER — Emergency Department (HOSPITAL_BASED_OUTPATIENT_CLINIC_OR_DEPARTMENT_OTHER): Payer: 59

## 2021-04-06 ENCOUNTER — Ambulatory Visit (INDEPENDENT_AMBULATORY_CARE_PROVIDER_SITE_OTHER): Payer: 59 | Admitting: Family

## 2021-04-06 ENCOUNTER — Emergency Department (HOSPITAL_BASED_OUTPATIENT_CLINIC_OR_DEPARTMENT_OTHER)
Admission: EM | Admit: 2021-04-06 | Discharge: 2021-04-06 | Disposition: A | Discharge status: Home / Self Care | Payer: 59 | Attending: Emergency Medicine | Admitting: Emergency Medicine

## 2021-04-06 ENCOUNTER — Encounter (HOSPITAL_BASED_OUTPATIENT_CLINIC_OR_DEPARTMENT_OTHER): Payer: Self-pay

## 2021-04-06 ENCOUNTER — Encounter: Payer: Self-pay | Admitting: Family

## 2021-04-06 VITALS — BP 121/69 | HR 97 | Temp 98.3°F | Resp 18 | Ht 60.98 in | Wt 268.0 lb

## 2021-04-06 DIAGNOSIS — E119 Type 2 diabetes mellitus without complications: Secondary | ICD-10-CM

## 2021-04-06 DIAGNOSIS — Z794 Long term (current) use of insulin: Secondary | ICD-10-CM | POA: Diagnosis not present

## 2021-04-06 DIAGNOSIS — J189 Pneumonia, unspecified organism: Secondary | ICD-10-CM | POA: Insufficient documentation

## 2021-04-06 DIAGNOSIS — Z87891 Personal history of nicotine dependence: Secondary | ICD-10-CM | POA: Insufficient documentation

## 2021-04-06 DIAGNOSIS — Z09 Encounter for follow-up examination after completed treatment for conditions other than malignant neoplasm: Secondary | ICD-10-CM

## 2021-04-06 DIAGNOSIS — M7918 Myalgia, other site: Secondary | ICD-10-CM | POA: Diagnosis not present

## 2021-04-06 DIAGNOSIS — Z7984 Long term (current) use of oral hypoglycemic drugs: Secondary | ICD-10-CM | POA: Insufficient documentation

## 2021-04-06 DIAGNOSIS — Z20822 Contact with and (suspected) exposure to covid-19: Secondary | ICD-10-CM | POA: Diagnosis not present

## 2021-04-06 DIAGNOSIS — R52 Pain, unspecified: Secondary | ICD-10-CM

## 2021-04-06 LAB — RESP PANEL BY RT-PCR (FLU A&B, COVID) ARPGX2
Influenza A by PCR: NEGATIVE
Influenza B by PCR: NEGATIVE
SARS Coronavirus 2 by RT PCR: NEGATIVE

## 2021-04-06 MED ORDER — SODIUM CHLORIDE 0.9 % IV BOLUS: 1000.0000 mL | Freq: Once | INTRAVENOUS | Status: DC

## 2021-04-06 MED ORDER — METFORMIN HCL ER 500 MG PO TB24: 1000.0000 mg | ORAL_TABLET | Freq: Two times a day (BID) | ORAL | 0 refills | Status: DC

## 2021-04-06 MED ORDER — KETOROLAC TROMETHAMINE 30 MG/ML IJ SOLN
30.0000 mg | Freq: Once | INTRAMUSCULAR | Status: AC
  Administered 2021-04-06: 18:00:00 30 mg via INTRAVENOUS

## 2021-04-06 MED ORDER — DOXYCYCLINE HYCLATE 100 MG PO CAPS: 100.0000 mg | ORAL_CAPSULE | Freq: Two times a day (BID) | ORAL | 0 refills | Status: DC

## 2021-04-06 MED ORDER — INSULIN GLARGINE 100 UNIT/ML SOLOSTAR PEN: 16.0000 [IU] | PEN_INJECTOR | Freq: Every day | SUBCUTANEOUS | 0 refills | Status: DC

## 2021-04-06 MED ORDER — SODIUM CHLORIDE 0.9 % IV BOLUS
1000.0000 mL | Freq: Once | INTRAVENOUS | Status: AC
  Administered 2021-04-06: 18:00:00 1000 mL via INTRAVENOUS

## 2021-04-06 MED ORDER — KETOROLAC TROMETHAMINE 30 MG/ML IJ SOLN: 30.0000 mg | Freq: Once | INTRAMUSCULAR | Status: DC

## 2021-04-06 MED ORDER — IPRATROPIUM-ALBUTEROL 0.5-2.5 (3) MG/3ML IN SOLN
3.0000 mL | Freq: Once | RESPIRATORY_TRACT | Status: AC
  Administered 2021-04-06: 17:00:00 3 mL via RESPIRATORY_TRACT
  Filled 2021-04-06: qty 3

## 2021-04-06 MED ORDER — KETOROLAC TROMETHAMINE 30 MG/ML IJ SOLN
30.0000 mg | Freq: Once | INTRAMUSCULAR | Status: DC
  Filled 2021-04-06: qty 1

## 2021-04-06 MED ORDER — GUAIFENESIN ER 600 MG PO TB12: 600.0000 mg | ORAL_TABLET | Freq: Two times a day (BID) | ORAL | 0 refills | Status: AC

## 2021-04-06 NOTE — Progress Notes (Signed)
Pt presents for diabetes follow-up and ED follow from 12/15,pt states she feels like she is not getting any better

## 2021-04-06 NOTE — ED Triage Notes (Signed)
Pt states she was dx with PNA last week-states she feels no better-NAD-steady gait

## 2021-04-06 NOTE — Discharge Instructions (Addendum)
Please continue your oral antibiotics. Albuterol as needed. Mucinex may help with your congestion. Take motrin/tylenol every 4-6 hours as needed.

## 2021-04-06 NOTE — ED Notes (Signed)
E-SIG PAD NOT WORKING IN TRIAGE-PT VERBALLY AGREED TO MSE STATEMENT °

## 2021-04-06 NOTE — ED Provider Notes (Signed)
[facility] EMERGENCY DEPARTMENT Provider Note   CSN: [id number] Arrival date & time: 04/06/21  1434     History Chief Complaint  Patient presents with   Pneumonia    [name] is a 50 y.o. female.  This is a 50 y.o. female with significant medical history as below, including DM, GERD who presents to the ED with complaint of pneumonia.  Patient was diagnosed approximately 5 days ago with lingular pneumonia.  She is started on doxycycline.  She is taking this medication as prescribed.  She went to see her primary care doctor earlier today for a repeat assessment who sent to the ER for evaluation.  Patient feels that she is potentially dehydrated although she is tolerant oral intake without difficulty.  Is having some mild nausea without emesis.  Intermittent diarrhea over the past week which is not acutely worsened from her baseline.  No blood in her stool or dark-colored stool.  She has been taking her albuterol inhaler sporadically for symptomatic control, she is concerned that it makes her jittery.  She is also concerned that she is not having a productive cough and she is worried that her pneumonia has not been treated.  She is somewhat fatigued and having arthralgias over the past few days but not acutely worsened since onset of pna. No change to bladder function.  No abdominal pain.  No fevers or chills.  The history is provided by the patient. No language interpreter was used.  Pneumonia Pertinent negatives include no chest pain, no abdominal pain, no headaches and no shortness of breath.      Past Medical History:  Diagnosis Date   Cellulitis 07/2011   Diabetes mellitus    dx 2016   GERD (gastroesophageal reflux disease)     Patient Active Problem List   Diagnosis Date Noted   Left lumbar radiculopathy 02/22/2019   Trigger finger of left thumb 01/07/2019   Cellulitis and abscess of right leg 01/02/2018   Bilateral carpal tunnel syndrome 07/06/2016   Left  elbow pain 02/17/2016   Uncontrolled type 2 diabetes mellitus with hyperglycemia ([place]) 10/29/2015   Uncontrolled type 2 diabetes mellitus without complication, with long-term current use of insulin 10/29/2015   Injury of right hand 05/19/2015   Cervical disc disorder with radiculopathy, unspecified cervical region 10/10/2013   Diabetes ([place]) 04/07/2013   Pyelonephritis 04/06/2013   RLQ abdominal pain 04/06/2013   Fever 09/07/2012   Headache(784.0) 09/07/2012   Cellulitis 08/05/2011   Peripheral edema 08/05/2011   Gastroesophageal reflux disease 08/05/2011   BMI 50.0-59.9, adult ([place]) 08/05/2011    Past Surgical History:  Procedure Laterality Date   ABLATION     CARPAL TUNNEL RELEASE Left 09/13/2016   Procedure: LEFT CARPAL TUNNEL RELEASE;  Surgeon: Daryll Brod, MD;  Location: [place];  Service: Orthopedics;  Laterality: Left;   CHOLECYSTECTOMY     KNEE ARTHROSCOPY     LAPAROSCOPIC TUBAL LIGATION  04/29/2011   Procedure: LAPAROSCOPIC TUBAL LIGATION;  Surgeon: Luz Lex, MD;  Location: [facility];  Service: Gynecology;  Laterality: Bilateral;  with Filshie Clips   ROTATOR CUFF REPAIR     TUBAL LIGATION       OB History   No obstetric history on file.     Family History  Problem Relation Age of Onset   Cancer Mother    Diabetes Mother    Hypertension Mother     Social History   Tobacco Use   Smoking status: Former  Packs/day: 0.50    Years: 2.00    Pack years: 1.00    Types: Cigarettes    Quit date: 07/22/2011    Years since quitting: 9.7   Smokeless tobacco: Never  Vaping Use   Vaping Use: Never used  Substance Use Topics   Alcohol use: Yes    Alcohol/week: 0.0 standard drinks    Comment: occ   Drug use: No    Home Medications Prior to Admission medications   Medication Sig Start Date End Date Taking? Authorizing Provider  guaiFENesin (MUCINEX) 600 MG 12 hr tablet Take 1 tablet (600 mg total) by mouth 2 (two) times daily for 7 days. 04/06/21 04/13/21 Yes Jeanell Sparrow, DO  traMADol (ULTRAM) 50 MG tablet Take 1 tablet (50 mg total) by mouth every 12 (twelve) hours as needed. 10/12/20 10/12/21  Magnant, Gerrianne Scale, PA-C  Blood Glucose Monitoring Suppl (TRUE METRIX METER) w/Device KIT Use as directed 11/10/20   Camillia Herter, NP  doxycycline (VIBRAMYCIN) 100 MG capsule Take 1 capsule (100 mg total) by mouth 2 (two) times daily. One po bid x 7 days 04/06/21   Camillia Herter, NP  gabapentin (NEURONTIN) 100 MG capsule Take 1 capsule (100 mg total) by mouth at bedtime. 01/15/21 02/14/21  Camillia Herter, NP  glimepiride (AMARYL) 1 MG tablet Take 1 tablet (1 mg total) by mouth 2 (two) times daily with a meal. 03/01/21 05/30/21  Camillia Herter, NP  glucose blood (TRUE METRIX BLOOD GLUCOSE TEST) test strip Use as instructed 11/10/20   Camillia Herter, NP  insulin glargine (LANTUS) 100 UNIT/ML Solostar Pen Inject 16 Units into the skin at bedtime. 04/06/21 07/09/21  Camillia Herter, NP  Insulin Pen Needle (PEN NEEDLES) 31G X 8 MM MISC UAD 03/01/21   Camillia Herter, NP  lidocaine (LIDODERM) 5 % Place 1 patch onto the skin daily. Remove & Discard patch within 12 hours or as directed by MD 12/30/20   Loni Beckwith, PA-C  metFORMIN (GLUCOPHAGE-XR) 500 MG 24 hr tablet Take 2 tablets (1,000 mg total) by mouth 2 (two) times daily with a meal. 04/06/21 07/05/21  Camillia Herter, NP  omeprazole (PRILOSEC) 40 MG capsule Take 40 mg by mouth daily.    [provider]  ondansetron (ZOFRAN-ODT) 8 MG disintegrating tablet Take 1 tablet (8 mg total) by mouth every 8 (eight) hours as needed for nausea or vomiting. 04/02/21   Molpus, Jenny Reichmann, MD  TRUEplus Lancets 28G MISC Use as directed 11/10/20   Camillia Herter, NP  pantoprazole (PROTONIX) 20 MG tablet Take 1 tablet (20 mg total) by mouth daily. 05/26/17 06/27/20  Malvin Johns, MD    Allergies    Diclofenac, Vancomycin, and Adhesive [tape]  Review of Systems   Review of Systems  Constitutional:  Positive for fatigue.  Negative for chills and fever.  HENT:  Negative for facial swelling and trouble swallowing.   Eyes:  Negative for photophobia and visual disturbance.  Respiratory:  Positive for cough. Negative for shortness of breath.   Cardiovascular:  Negative for chest pain and palpitations.  Gastrointestinal:  Positive for diarrhea and nausea. Negative for abdominal pain and vomiting.  Endocrine: Negative for polydipsia and polyuria.  Genitourinary:  Negative for difficulty urinating and hematuria.  Musculoskeletal:  Positive for arthralgias. Negative for gait problem and joint swelling.  Skin:  Negative for pallor and rash.  Neurological:  Negative for syncope and headaches.  Psychiatric/Behavioral:  Negative for agitation and confusion.  Physical Exam Updated Vital Signs BP 124/73 (BP Location: Left Arm)    Pulse 95    Temp 97.7 F (36.5 C) (Oral)    Resp 17    Ht 5' 1"  (1.549 m)    Wt 125.2 kg    SpO2 99%    BMI 52.15 kg/m   Physical Exam Vitals and nursing note reviewed.  Constitutional:      General: She is not in acute distress.    Appearance: Normal appearance. She is obese. She is not ill-appearing, toxic-appearing or diaphoretic.  HENT:     Head: Normocephalic and atraumatic.     Right Ear: External ear normal.     Left Ear: External ear normal.     Nose: Nose normal.     Mouth/Throat:     Mouth: Mucous membranes are moist.  Eyes:     General: No scleral icterus.       Right eye: No discharge.        Left eye: No discharge.  Cardiovascular:     Rate and Rhythm: Normal rate and regular rhythm.     Pulses: Normal pulses.     Heart sounds: Normal heart sounds.  Pulmonary:     Effort: Pulmonary effort is normal. No respiratory distress.     Breath sounds: Normal breath sounds.  Abdominal:     General: Abdomen is flat. There is no distension.     Tenderness: There is no abdominal tenderness. There is no guarding.  Musculoskeletal:        General: Normal range of motion.      Cervical back: Normal range of motion.     Right lower leg: No edema.     Left lower leg: No edema.  Skin:    General: Skin is warm and dry.     Capillary Refill: Capillary refill takes less than 2 seconds.  Neurological:     Mental Status: She is alert.  Psychiatric:        Mood and Affect: Mood normal.        Behavior: Behavior normal.    ED Results / Procedures / Treatments   Labs (all labs ordered are listed, but only abnormal results are displayed) Labs Reviewed  RESP PANEL BY RT-PCR (FLU A&B, COVID) [id number]    EKG None  Radiology DG Chest Portable 1 View  Result Date: 04/06/2021 CLINICAL DATA:  pna rpt EXAM: PORTABLE CHEST 1 VIEW COMPARISON:  04/01/2021. FINDINGS: Mildly improved retrocardiac opacities. No visible pleural effusions or pneumothorax. Similar cardiomediastinal silhouette. IMPRESSION: Mildly improved retrocardiac opacities, which could represent pneumonia and/or atelectasis. Electronically Signed   By: Margaretha Sheffield M.D.   On: 04/06/2021 16:06    Procedures Procedures   Medications Ordered in ED Medications  ipratropium-albuterol (DUONEB) 0.5-2.5 (3) MG/3ML nebulizer solution 3 mL (3 mLs Nebulization Given 04/06/21 1728)  sodium chloride 0.9 % bolus 1,000 mL (0 mLs Intravenous Stopping Infusion hung by another clincian 04/06/21 1915)  ketorolac (TORADOL) 30 MG/ML injection 30 mg (30 mg Intravenous Given 04/06/21 1804)    ED Course  I have reviewed the triage vital signs and the nursing notes.  Pertinent labs & imaging results that were available during my care of the patient were reviewed by me and considered in my medical decision making (see chart for details).    MDM Rules/Calculators/A&P                          CC: Pneumonia, cough  This patient complains of above; this involves an extensive number of treatment options and is a complaint that carries with it a high risk of complications and morbidity. Vital signs were reviewed. Serious  etiologies considered.  Record review:   Previous records obtained and reviewed   Work up as above, notable for:   imaging results that were available during my care of the patient were reviewed by me and considered in my medical decision making.   I ordered imaging studies which included chest x-ray and I independently visualized and interpreted imaging which showed improvement to retrocardiac opacities  Viral swab negative  Management: Patient given IV fluids, Toradol, nebulized breathing treatment.  Reassessment:  Patient reports she is feeling much better.  Discussed supportive care for treatment of her symptomatic complaints.  Strict return precautions. Continue abx as prescribed.   The patient improved significantly and was discharged in stable condition. Detailed discussions were had with the patient regarding current findings, and need for close f/u with PCP or on call doctor. The patient has been instructed to return immediately if the symptoms worsen in any way for re-evaluation. Patient verbalized understanding and is in agreement with current care plan. All questions answered prior to discharge.          This chart was dictated using voice recognition software.  Despite best efforts to proofread,  errors can occur which can change the documentation meaning.   Final Clinical Impression(s) / ED Diagnoses Final diagnoses:  Community acquired pneumonia, unspecified laterality  Body aches    Rx / DC Orders ED Discharge Orders          Ordered    guaiFENesin (MUCINEX) 600 MG 12 hr tablet  2 times daily        04/06/21 1737             Jeanell Sparrow, DO 04/07/21 0007

## 2021-04-13 ENCOUNTER — Ambulatory Visit: Payer: 59 | Admitting: Family

## 2021-05-02 NOTE — Progress Notes (Deleted)
Patient ID: [name], female    DOB: November 26, 1970  MRN: [id number]  CC: Diabetes Follow-Up   Subjective: [name] is a 51 y.o. female who presents for diabetes follow-up.  Her concerns today include:  DIABETES TYPE 2 FOLLOW-UP: 04/06/2021: - Increase Metformin XR from 500 mg twice daily to 1000 mg twice daily.  - Increase Insulin Glargine from 13 units daily to 16 units daily.  - Continue Glimepiride as prescribed. No refills needed as of present.   05/06/2021:  2. HOSPITAL DISCHARGE FOLLOW-UP: 04/06/2021 at [facility] Emergency Department per DO note: CC: Pneumonia, cough   This patient complains of above; this involves an extensive number of treatment options and is a complaint that carries with it a high risk of complications and morbidity. Vital signs were reviewed. Serious etiologies considered.   Record review:    Previous records obtained and reviewed    Work up as above, notable for:   imaging results that were available during my care of the patient were reviewed by me and considered in my medical decision making.   I ordered imaging studies which included chest x-ray and I independently visualized and interpreted imaging which showed improvement to retrocardiac opacities   Viral swab negative   Management: Patient given IV fluids, Toradol, nebulized breathing treatment.   Reassessment:  Patient reports she is feeling much better.   Discussed supportive care for treatment of her symptomatic complaints.  Strict return precautions. Continue abx as prescribed.    The patient improved significantly and was discharged in stable condition. Detailed discussions were had with the patient regarding current findings, and need for close f/u with PCP or on call doctor. The patient has been instructed to return immediately if the symptoms worsen in any way for re-evaluation. Patient verbalized understanding and is in agreement with current care plan.  All questions answered prior to discharge.         Patient Active Problem List   Diagnosis Date Noted   Left lumbar radiculopathy 02/22/2019   Trigger finger of left thumb 01/07/2019   Cellulitis and abscess of right leg 01/02/2018   Bilateral carpal tunnel syndrome 07/06/2016   Left elbow pain 02/17/2016   Uncontrolled type 2 diabetes mellitus with hyperglycemia ([place]) 10/29/2015   Uncontrolled type 2 diabetes mellitus without complication, with long-term current use of insulin 10/29/2015   Injury of right hand 05/19/2015   Cervical disc disorder with radiculopathy, unspecified cervical region 10/10/2013   Diabetes ([place]) 04/07/2013   Pyelonephritis 04/06/2013   RLQ abdominal pain 04/06/2013   Fever 09/07/2012   Headache(784.0) 09/07/2012   Cellulitis 08/05/2011   Peripheral edema 08/05/2011   Gastroesophageal reflux disease 08/05/2011   BMI 50.0-59.9, adult ([place]) 08/05/2011     Current Outpatient Medications on File Prior to Visit  Medication Sig Dispense Refill   traMADol (ULTRAM) 50 MG tablet Take 1 tablet (50 mg total) by mouth every 12 (twelve) hours as needed. 20 tablet 0   Blood Glucose Monitoring Suppl (TRUE METRIX METER) w/Device KIT Use as directed 1 kit 0   doxycycline (VIBRAMYCIN) 100 MG capsule Take 1 capsule (100 mg total) by mouth 2 (two) times daily. One po bid x 7 days 14 capsule 0   gabapentin (NEURONTIN) 100 MG capsule Take 1 capsule (100 mg total) by mouth at bedtime. 30 capsule 0   glimepiride (AMARYL) 1 MG tablet Take 1 tablet (1 mg total) by mouth 2 (two) times daily with a meal. 180 tablet 0  glucose blood (TRUE METRIX BLOOD GLUCOSE TEST) test strip Use as instructed 100 each 12   insulin glargine (LANTUS) 100 UNIT/ML Solostar Pen Inject 16 Units into the skin at bedtime. 15 mL 0   Insulin Pen Needle (PEN NEEDLES) 31G X 8 MM MISC UAD 100 each 0   lidocaine (LIDODERM) 5 % Place 1 patch onto the skin daily. Remove & Discard patch within 12 hours or as  directed by MD 30 patch 0   metFORMIN (GLUCOPHAGE-XR) 500 MG 24 hr tablet Take 2 tablets (1,000 mg total) by mouth 2 (two) times daily with a meal. 360 tablet 0   omeprazole (PRILOSEC) 40 MG capsule Take 40 mg by mouth daily.     ondansetron (ZOFRAN-ODT) 8 MG disintegrating tablet Take 1 tablet (8 mg total) by mouth every 8 (eight) hours as needed for nausea or vomiting. 10 tablet 0   TRUEplus Lancets 28G MISC Use as directed 100 each 4   [DISCONTINUED] pantoprazole (PROTONIX) 20 MG tablet Take 1 tablet (20 mg total) by mouth daily. 30 tablet 0   No current facility-administered medications on file prior to visit.    Allergies  Allergen Reactions   Diclofenac Other (See Comments)    "red man syndrome"   Vancomycin Other (See Comments)    Red man syndrome   Adhesive [Tape] Itching and Rash    Social History   Socioeconomic History   Marital status: Married    Spouse name: Not on file   Number of children: Not on file   Years of education: Not on file   Highest education level: Not on file  Occupational History   Not on file  Tobacco Use   Smoking status: Former    Packs/day: 0.50    Years: 2.00    Pack years: 1.00    Types: Cigarettes    Quit date: 07/22/2011    Years since quitting: 9.7   Smokeless tobacco: Never  Vaping Use   Vaping Use: Never used  Substance and Sexual Activity   Alcohol use: Yes    Alcohol/week: 0.0 standard drinks    Comment: occ   Drug use: No   Sexual activity: Not on file  Other Topics Concern   Not on file  Social History Narrative   Not on file   Social Determinants of Health   Financial Resource Strain: Not on file  Food Insecurity: Not on file  Transportation Needs: Not on file  Physical Activity: Not on file  Stress: Not on file  Social Connections: Not on file  Intimate Partner Violence: Not on file    Family History  Problem Relation Age of Onset   Cancer Mother    Diabetes Mother    Hypertension Mother     Past  Surgical History:  Procedure Laterality Date   ABLATION     CARPAL TUNNEL RELEASE Left 09/13/2016   Procedure: LEFT CARPAL [place];  Surgeon: Daryll Brod, MD;  Location: [facility];  Service: Orthopedics;  Laterality: Left;   CHOLECYSTECTOMY     KNEE ARTHROSCOPY     LAPAROSCOPIC TUBAL LIGATION  04/29/2011   Procedure: LAPAROSCOPIC TUBAL LIGATION;  Surgeon: Luz Lex, MD;  Location: [facility];  Service: Gynecology;  Laterality: Bilateral;  with Filshie Clips   ROTATOR CUFF REPAIR     TUBAL LIGATION      ROS: Review of Systems Negative except as stated above  PHYSICAL EXAM: There were no vitals taken for this visit.  Physical Exam  {female  adult master:310786} {female adult master:310785}  CMP Latest Ref Rng & Units 04/01/2021 03/01/2021 10/17/2019  Glucose 70 - 99 mg/dL 193(H) 112(H) 217(H)  BUN 6 - 20 mg/dL 10 13 12   Creatinine 0.44 - 1.00 mg/dL 0.74 0.71 0.74  Sodium 135 - 145 mmol/L 135 140 139  Potassium 3.5 - 5.1 mmol/L 3.5 4.6 3.7  Chloride 98 - 111 mmol/L 100 101 102  CO2 22 - 32 mmol/L 26 25 27   Calcium 8.9 - 10.3 mg/dL 8.5(L) 10.0 8.7(L)  Total Protein 6.5 - 8.1 g/dL 7.3 - 7.2  Total Bilirubin 0.3 - 1.2 mg/dL 0.4 - 0.6  Alkaline Phos 38 - 126 U/L 73 - 87  AST 15 - 41 U/L 24 - 16  ALT 0 - 44 U/L 19 - 20   Lipid Panel     Component Value Date/Time   CHOL  12/30/2008 0525    144        ATP III CLASSIFICATION:  <200     mg/dL   Desirable  200-239  mg/dL   Borderline High  >=240    mg/dL   High          TRIG 147 12/30/2008 0525   HDL 32 (L) 12/30/2008 0525   CHOLHDL 4.5 12/30/2008 0525   VLDL 29 12/30/2008 0525   LDLCALC  12/30/2008 0525    83        Total Cholesterol/HDL:CHD Risk Coronary Heart Disease Risk Table                     Men   Women  1/2 Average Risk   3.4   3.3  Average Risk       5.0   4.4  2 X Average Risk   9.6   7.1  3 X Average Risk  23.4   11.0        Use the calculated Patient Ratio above and the CHD Risk Table to determine the  patient's CHD Risk.        ATP III CLASSIFICATION (LDL):  <100     mg/dL   Optimal  100-129  mg/dL   Near or Above                    Optimal  130-159  mg/dL   Borderline  160-189  mg/dL   High  >190     mg/dL   Very High    CBC    Component Value Date/Time   WBC 8.1 04/01/2021 2359   RBC 4.92 04/01/2021 2359   HGB 13.8 04/01/2021 2359   HCT 41.0 04/01/2021 2359   PLT 390 04/01/2021 2359   MCV 83.3 04/01/2021 2359   MCH 28.0 04/01/2021 2359   MCHC 33.7 04/01/2021 2359   RDW 13.2 04/01/2021 2359   LYMPHSABS 2.5 04/01/2021 2359   MONOABS 0.6 04/01/2021 2359   EOSABS 0.1 04/01/2021 2359   BASOSABS 0.0 04/01/2021 2359    ASSESSMENT AND PLAN:  There are no diagnoses linked to this encounter.   Patient was given the opportunity to ask questions.  Patient verbalized understanding of the plan and was able to repeat key elements of the plan. Patient was given clear instructions to go to Emergency Department or return to medical center if symptoms don't improve, worsen, or new problems develop.The patient verbalized understanding.   No orders of the defined types were placed in this encounter.    Requested Prescriptions    No prescriptions requested or  ordered in this encounter    No follow-ups on file.  Camillia Herter, NP

## 2021-05-03 NOTE — Progress Notes (Signed)
Patient ID: [name], female    DOB: 06/13/1970  MRN: [id number]  CC: Diabetes Follow-Up   Subjective: [name] is a 51 y.o. female who presents for diabetes follow-up.   Her concerns today include: DIABETES TYPE 2 FOLLOW-UP: 04/06/2022: - Increase Metformin XR from 500 mg twice daily to 1000 mg twice daily.  - Increase Insulin Glargine from 13 units daily to 16 units daily.  - Continue Glimepiride as prescribed. No refills needed as of present.  - Follow-up with primary provider in 4 weeks or sooner if needed.   05/04/2021: Doing well on current regimen, no issues/concerns.   2. HOSPITAL DISCHARGE FOLLOW-UP: 04/06/2021 at [facility] Emergency Department per DO note: CC: Pneumonia, cough   This patient complains of above; this involves an extensive number of treatment options and is a complaint that carries with it a high risk of complications and morbidity. Vital signs were reviewed. Serious etiologies considered.   Record review:    Previous records obtained and reviewed    Work up as above, notable for:   imaging results that were available during my care of the patient were reviewed by me and considered in my medical decision making.   I ordered imaging studies which included chest x-ray and I independently visualized and interpreted imaging which showed improvement to retrocardiac opacities   Viral swab negative   Management: Patient given IV fluids, Toradol, nebulized breathing treatment.   Reassessment:  Patient reports she is feeling much better.   Discussed supportive care for treatment of her symptomatic complaints.  Strict return precautions. Continue abx as prescribed.    The patient improved significantly and was discharged in stable condition. Detailed discussions were had with the patient regarding current findings, and need for close f/u with PCP or on call doctor. The patient has been instructed to return immediately if the  symptoms worsen in any way for re-evaluation. Patient verbalized understanding and is in agreement with current care plan. All questions answered prior to discharge.    05/04/2021: Feeling better since hospital discharge. Reports having sinus pressure frontal bilateral with stuffy and runny nose. Otherwise feeling better.   Patient Active Problem List   Diagnosis Date Noted   Left lumbar radiculopathy 02/22/2019   Trigger finger of left thumb 01/07/2019   Cellulitis and abscess of right leg 01/02/2018   Bilateral carpal tunnel syndrome 07/06/2016   Left elbow pain 02/17/2016   Uncontrolled type 2 diabetes mellitus with hyperglycemia ([place]) 10/29/2015   Uncontrolled type 2 diabetes mellitus without complication, with long-term current use of insulin 10/29/2015   Injury of right hand 05/19/2015   Cervical disc disorder with radiculopathy, unspecified cervical region 10/10/2013   Diabetes ([place]) 04/07/2013   Pyelonephritis 04/06/2013   RLQ abdominal pain 04/06/2013   Fever 09/07/2012   Headache(784.0) 09/07/2012   Cellulitis 08/05/2011   Peripheral edema 08/05/2011   Gastroesophageal reflux disease 08/05/2011   BMI 50.0-59.9, adult ([place]) 08/05/2011     Current Outpatient Medications on File Prior to Visit  Medication Sig Dispense Refill   traMADol (ULTRAM) 50 MG tablet Take 1 tablet (50 mg total) by mouth every 12 (twelve) hours as needed. 20 tablet 0   Blood Glucose Monitoring Suppl (TRUE METRIX METER) w/Device KIT Use as directed 1 kit 0   doxycycline (VIBRAMYCIN) 100 MG capsule Take 1 capsule (100 mg total) by mouth 2 (two) times daily. One po bid x 7 days 14 capsule 0   gabapentin (NEURONTIN) 100 MG capsule Take  1 capsule (100 mg total) by mouth at bedtime. 30 capsule 0   glimepiride (AMARYL) 1 MG tablet Take 1 tablet (1 mg total) by mouth 2 (two) times daily with a meal. 180 tablet 0   glucose blood (TRUE METRIX BLOOD GLUCOSE TEST) test strip Use as instructed 100 each 12   insulin  glargine (LANTUS) 100 UNIT/ML Solostar Pen Inject 16 Units into the skin at bedtime. 15 mL 0   Insulin Pen Needle (PEN NEEDLES) 31G X 8 MM MISC UAD 100 each 0   lidocaine (LIDODERM) 5 % Place 1 patch onto the skin daily. Remove & Discard patch within 12 hours or as directed by MD 30 patch 0   metFORMIN (GLUCOPHAGE-XR) 500 MG 24 hr tablet Take 2 tablets (1,000 mg total) by mouth 2 (two) times daily with a meal. 360 tablet 0   omeprazole (PRILOSEC) 40 MG capsule Take 40 mg by mouth daily.     ondansetron (ZOFRAN-ODT) 8 MG disintegrating tablet Take 1 tablet (8 mg total) by mouth every 8 (eight) hours as needed for nausea or vomiting. 10 tablet 0   TRUEplus Lancets 28G MISC Use as directed 100 each 4   [DISCONTINUED] pantoprazole (PROTONIX) 20 MG tablet Take 1 tablet (20 mg total) by mouth daily. 30 tablet 0   No current facility-administered medications on file prior to visit.    Allergies  Allergen Reactions   Diclofenac Other (See Comments)    "red man syndrome"   Vancomycin Other (See Comments)    Red man syndrome   Adhesive [Tape] Itching and Rash    Social History   Socioeconomic History   Marital status: Married    Spouse name: Not on file   Number of children: Not on file   Years of education: Not on file   Highest education level: Not on file  Occupational History   Not on file  Tobacco Use   Smoking status: Former    Packs/day: 0.50    Years: 2.00    Pack years: 1.00    Types: Cigarettes    Quit date: 07/22/2011    Years since quitting: 9.7   Smokeless tobacco: Never  Vaping Use   Vaping Use: Never used  Substance and Sexual Activity   Alcohol use: Yes    Alcohol/week: 0.0 standard drinks    Comment: occ   Drug use: No   Sexual activity: Not on file  Other Topics Concern   Not on file  Social History Narrative   Not on file   Social Determinants of Health   Financial Resource Strain: Not on file  Food Insecurity: Not on file  Transportation Needs: Not on  file  Physical Activity: Not on file  Stress: Not on file  Social Connections: Not on file  Intimate Partner Violence: Not on file    Family History  Problem Relation Age of Onset   Cancer Mother    Diabetes Mother    Hypertension Mother     Past Surgical History:  Procedure Laterality Date   ABLATION     CARPAL TUNNEL RELEASE Left 09/13/2016   Procedure: LEFT CARPAL [place];  Surgeon: Daryll Brod, MD;  Location: [facility];  Service: Orthopedics;  Laterality: Left;   CHOLECYSTECTOMY     KNEE ARTHROSCOPY     LAPAROSCOPIC TUBAL LIGATION  04/29/2011   Procedure: LAPAROSCOPIC TUBAL LIGATION;  Surgeon: Luz Lex, MD;  Location: [facility];  Service: Gynecology;  Laterality: Bilateral;  with Filshie Clips  ROTATOR CUFF REPAIR     TUBAL LIGATION      ROS: Review of Systems Negative except as stated above  PHYSICAL EXAM: BP 111/75 (BP Location: Left Arm, Patient Position: Sitting, Cuff Size: Large)    Pulse 100    Temp 98.3 F (36.8 C)    Resp 18    Ht 5' 0.98" (1.549 m)    Wt 256 lb (116.1 kg)    SpO2 98%    BMI 48.40 kg/m    Physical Exam HENT:     Head: Normocephalic and atraumatic.  Eyes:     Extraocular Movements: Extraocular movements intact.     Conjunctiva/sclera: Conjunctivae normal.     Pupils: Pupils are equal, round, and reactive to light.  Cardiovascular:     Rate and Rhythm: Normal rate and regular rhythm.     Pulses: Normal pulses.     Heart sounds: Normal heart sounds.  Pulmonary:     Effort: Pulmonary effort is normal.     Breath sounds: Normal breath sounds.  Musculoskeletal:     Cervical back: Normal range of motion and neck supple.  Neurological:     General: No focal deficit present.     Mental Status: She is alert and oriented to person, place, and time.  Psychiatric:        Mood and Affect: Mood normal.        Behavior: Behavior normal.    ASSESSMENT AND PLAN: 1. Type 2 diabetes mellitus without complication, with long-term current use of  insulin ([place]): - Continue Metformin XR, Glimepiride, and Insulin Glargine as prescribed. No refills needed as of present.  - Discussed the importance of healthy eating habits, low-carbohydrate diet, low-sugar diet, regular aerobic exercise (at least 150 minutes a week as tolerated) and medication compliance to achieve or maintain control of diabetes. - Follow-up with primary provider in 4 weeks or sooner if needed for repeat hemoglobin A1c.   2. Hospital discharge follow-up: - Patient reports feeling better since hospital discharge.  3. Perennial allergic rhinitis: - Counseled to try course of over-the-counter allergy medication such as Cetirizine and Fluticasone.  - Follow-up with primary provider as scheduled.   4. Need for Tdap vaccination: - Administered today in office.  - Tdap vaccine greater than or equal to 7yo IM    Patient was given the opportunity to ask questions.  Patient verbalized understanding of the plan and was able to repeat key elements of the plan. Patient was given clear instructions to go to Emergency Department or return to medical center if symptoms don't improve, worsen, or new problems develop.The patient verbalized understanding.   Orders Placed This Encounter  Procedures   Tdap vaccine greater than or equal to 7yo IM     Return in about 4 weeks (around 06/01/2021) for Follow-Up or next available diabetes .  Camillia Herter, NP

## 2021-05-04 ENCOUNTER — Other Ambulatory Visit: Payer: Self-pay

## 2021-05-04 ENCOUNTER — Encounter: Payer: Self-pay | Admitting: Family

## 2021-05-04 ENCOUNTER — Ambulatory Visit (INDEPENDENT_AMBULATORY_CARE_PROVIDER_SITE_OTHER): Payer: 59 | Admitting: Family

## 2021-05-04 VITALS — BP 111/75 | HR 100 | Temp 98.3°F | Resp 18 | Ht 60.98 in | Wt 256.0 lb

## 2021-05-04 DIAGNOSIS — J3089 Other allergic rhinitis: Secondary | ICD-10-CM | POA: Diagnosis not present

## 2021-05-04 DIAGNOSIS — E119 Type 2 diabetes mellitus without complications: Secondary | ICD-10-CM

## 2021-05-04 DIAGNOSIS — Z794 Long term (current) use of insulin: Secondary | ICD-10-CM | POA: Diagnosis not present

## 2021-05-04 DIAGNOSIS — Z23 Encounter for immunization: Secondary | ICD-10-CM | POA: Diagnosis not present

## 2021-05-04 DIAGNOSIS — Z09 Encounter for follow-up examination after completed treatment for conditions other than malignant neoplasm: Secondary | ICD-10-CM

## 2021-05-04 NOTE — Progress Notes (Signed)
Pt presents for diabetes follow-up, pt experiencing sinus pressure for approx 2 days

## 2021-05-06 ENCOUNTER — Ambulatory Visit: Payer: 59 | Admitting: Family

## 2021-05-06 DIAGNOSIS — E119 Type 2 diabetes mellitus without complications: Secondary | ICD-10-CM

## 2021-05-13 NOTE — Progress Notes (Signed)
Erroneous encounter

## 2021-05-17 ENCOUNTER — Encounter: Payer: 59 | Admitting: Family

## 2021-05-25 ENCOUNTER — Other Ambulatory Visit: Payer: Self-pay

## 2021-05-25 ENCOUNTER — Emergency Department (HOSPITAL_BASED_OUTPATIENT_CLINIC_OR_DEPARTMENT_OTHER)
Admission: EM | Admit: 2021-05-25 | Discharge: 2021-05-25 | Disposition: A | Discharge status: Home / Self Care | Payer: 59 | Attending: Emergency Medicine | Admitting: Emergency Medicine

## 2021-05-25 ENCOUNTER — Encounter (HOSPITAL_BASED_OUTPATIENT_CLINIC_OR_DEPARTMENT_OTHER): Payer: Self-pay

## 2021-05-25 DIAGNOSIS — R21 Rash and other nonspecific skin eruption: Secondary | ICD-10-CM | POA: Diagnosis present

## 2021-05-25 DIAGNOSIS — L249 Irritant contact dermatitis, unspecified cause: Secondary | ICD-10-CM | POA: Insufficient documentation

## 2021-05-25 NOTE — ED Provider Notes (Signed)
[facility] EMERGENCY DEPARTMENT Provider Note   CSN: [id number] Arrival date & time: 05/25/21  1543     History  No chief complaint on file.   [name] is a 51 y.o. female with no significant past medical history presents with concern for a red rash under the right eye, some occasional eye irritation, sensitivity without redness, pain.  She endorses some white appearance of the underside of her eyelid on the right although it is not present at this time.  She reports that the lesion on her right cheek seems to be more prevalent when she has been exposed to the sun.  HPI     Home Medications Prior to Admission medications   Medication Sig Start Date End Date Taking? Authorizing Provider  traMADol (ULTRAM) 50 MG tablet Take 1 tablet (50 mg total) by mouth every 12 (twelve) hours as needed. 10/12/20 10/12/21  Magnant, Gerrianne Scale, PA-C  Blood Glucose Monitoring Suppl (TRUE METRIX METER) w/Device KIT Use as directed 11/10/20   Camillia Herter, NP  doxycycline (VIBRAMYCIN) 100 MG capsule Take 1 capsule (100 mg total) by mouth 2 (two) times daily. One po bid x 7 days 04/06/21   Camillia Herter, NP  gabapentin (NEURONTIN) 100 MG capsule Take 1 capsule (100 mg total) by mouth at bedtime. 01/15/21 02/14/21  Camillia Herter, NP  glimepiride (AMARYL) 1 MG tablet Take 1 tablet (1 mg total) by mouth 2 (two) times daily with a meal. 03/01/21 05/30/21  Camillia Herter, NP  glucose blood (TRUE METRIX BLOOD GLUCOSE TEST) test strip Use as instructed 11/10/20   Camillia Herter, NP  insulin glargine (LANTUS) 100 UNIT/ML Solostar Pen Inject 16 Units into the skin at bedtime. 04/06/21 07/09/21  Camillia Herter, NP  Insulin Pen Needle (PEN NEEDLES) 31G X 8 MM MISC UAD 03/01/21   Camillia Herter, NP  lidocaine (LIDODERM) 5 % Place 1 patch onto the skin daily. Remove & Discard patch within 12 hours or as directed by MD 12/30/20   Loni Beckwith, PA-C  metFORMIN (GLUCOPHAGE-XR) 500 MG 24 hr  tablet Take 2 tablets (1,000 mg total) by mouth 2 (two) times daily with a meal. 04/06/21 07/05/21  Camillia Herter, NP  omeprazole (PRILOSEC) 40 MG capsule Take 40 mg by mouth daily.    [provider]  ondansetron (ZOFRAN-ODT) 8 MG disintegrating tablet Take 1 tablet (8 mg total) by mouth every 8 (eight) hours as needed for nausea or vomiting. 04/02/21   Molpus, Jenny Reichmann, MD  TRUEplus Lancets 28G MISC Use as directed 11/10/20   Camillia Herter, NP  pantoprazole (PROTONIX) 20 MG tablet Take 1 tablet (20 mg total) by mouth daily. 05/26/17 06/27/20  Malvin Johns, MD      Allergies    Diclofenac, Vancomycin, and Adhesive [tape]    Review of Systems   Review of Systems  Skin:  Positive for rash.  All other systems reviewed and are negative.  Physical Exam Updated Vital Signs BP (!) 106/55    Pulse 100    Temp 98.2 F (36.8 C) (Oral)    Resp 17    Ht _0  (1.549 m)    Wt 116.1 kg    SpO2 98%    BMI 48.37 kg/m  Physical Exam Vitals and nursing note reviewed.  Constitutional:      General: She is not in acute distress.    Appearance: Normal appearance.  HENT:     Head: Normocephalic and atraumatic.  Eyes:     General:        Right eye: No discharge.        Left eye: No discharge.     Comments: Pupils are equal round reactive to light bilaterally.  EOMs are intact bilaterally.  I do not see any evidence of conjunctival irritation, redness, discharge in bilateral eyes.  No evidence of foreign body, and intact visual acuity grossly.  Cardiovascular:     Rate and Rhythm: Normal rate and regular rhythm.  Pulmonary:     Effort: Pulmonary effort is normal. No respiratory distress.  Musculoskeletal:        General: No deformity.  Skin:    General: Skin is warm and dry.     Comments: Patient with overall normal appearance, with possible increased roughness of the right cheek compared to the left.  Based on the photo that she shows me some concern for red scaly rash of actinic keratosis.   Neurological:     Mental Status: She is alert and oriented to person, place, and time.  Psychiatric:        Mood and Affect: Mood normal.        Behavior: Behavior normal.    ED Results / Procedures / Treatments   Labs (all labs ordered are listed, but only abnormal results are displayed) Labs Reviewed - No data to display  EKG None  Radiology No results found.  Procedures Procedures    Medications Ordered in ED Medications - No data to display  ED Course/ Medical Decision Making/ A&P                           Medical Decision Making  The patient reports red rash on the right cheek, as well as some right eye redness and irritation.  The symptoms are not present at this time other than some very mild feeling of dryness of the right eye.  Patient denies gross vision changes, endorses some slight blurriness.  Based on the picture that she shows me discussed possible contact dermatitis versus actinic keratosis.  Encouraged moisturization for a week twice daily, and follow-up with dermatology if lesion does not resolve for further evaluation and treatment.  Patient understands agrees to plan, discharged in stable condition at this time. Final Clinical Impression(s) / ED Diagnoses Final diagnoses:  Irritant contact dermatitis, unspecified trigger    Rx / DC Orders ED Discharge Orders     None         Anselmo Pickler, PA-C 05/25/21 1628    Hayden Rasmussen, MD 05/26/21 [phone]

## 2021-05-25 NOTE — Discharge Instructions (Addendum)
As we discussed based on your presentation today my suspicion is that you may have an irritant dermatitis versus blepharitis around your eye.  You can use some gentle cream such as CeraVe, Cetaphil in the affected area, as well as lubricating eyedrops if you have some eye irritation along with this.  I do not see any evidence of an infection in your eye at this time.  The other diagnosis that we discussed is an actinic keratosis which is a precancerous lesion of the skin, that can be more prevalent when exposed to the sun.  Recommend that if the lesion has not resolved in around a week you follow-up with a dermatologist for further evaluation and treatment.

## 2021-05-25 NOTE — ED Triage Notes (Signed)
Pt reports on Saturday she noted a red rash under her right eye and now reports eye burning and painful; photosensitivity, no vision changes

## 2021-05-31 NOTE — Progress Notes (Signed)
Patient ID: [name], female    DOB: 1971-01-31  MRN: [id number]  CC: Diabetes Follow-Up   Subjective: [name] is a 51 y.o. female who presents for diabetes follow-up.   Her concerns today include:  DIABETES TYPE 2 FOLLOW-UP: 05/04/2021: Continue Metformin XR, Glimepiride, and Insulin Glargine as prescribed.   06/01/2021: Doing well on current regimen. No issues/concerns. Not checking blood sugars at home but plans to begin soon. Endorses drinking more sodas than she should and snacking as well.  2. KNOT ON NECK: Noticed several days ago while in the shower. Endorses tenderness. Denies shortness of breath, chest pain, difficulty swallowing/breathing, and additional red flag symptoms.   3. DERMATITIS FOLLOW-UP: 05/25/2021 at [facility] Emergency Department per MD note: The patient reports red rash on the right cheek, as well as some right eye redness and irritation.  The symptoms are not present at this time other than some very mild feeling of dryness of the right eye.  Patient denies gross vision changes, endorses some slight blurriness.  Based on the picture that she shows me discussed possible contact dermatitis versus actinic keratosis.  Encouraged moisturization for a week twice daily, and follow-up with dermatology if lesion does not resolve for further evaluation and treatment.  Patient understands agrees to plan, discharged in stable condition at this time.  06/01/2021: Since then rash/eye irritation has overall improved. Not ready for referral to Dermatology as of present. Thinks it was related to a pair of sunglasses she was wearing on recent vacation and she is no longer wearing them.  Patient Active Problem List   Diagnosis Date Noted   Left lumbar radiculopathy 02/22/2019   Trigger finger of left thumb 01/07/2019   Cellulitis and abscess of right leg 01/02/2018   Bilateral carpal tunnel syndrome 07/06/2016   Left elbow pain 02/17/2016    Uncontrolled type 2 diabetes mellitus with hyperglycemia ([place]) 10/29/2015   Uncontrolled type 2 diabetes mellitus without complication, with long-term current use of insulin 10/29/2015   Injury of right hand 05/19/2015   Cervical disc disorder with radiculopathy, unspecified cervical region 10/10/2013   Diabetes ([place]) 04/07/2013   Pyelonephritis 04/06/2013   RLQ abdominal pain 04/06/2013   Fever 09/07/2012   Headache(784.0) 09/07/2012   Cellulitis 08/05/2011   Peripheral edema 08/05/2011   Gastroesophageal reflux disease 08/05/2011   BMI 50.0-59.9, adult ([place]) 08/05/2011     Current Outpatient Medications on File Prior to Visit  Medication Sig Dispense Refill   traMADol (ULTRAM) 50 MG tablet Take 1 tablet (50 mg total) by mouth every 12 (twelve) hours as needed. 20 tablet 0   Blood Glucose Monitoring Suppl (TRUE METRIX METER) w/Device KIT Use as directed 1 kit 0   doxycycline (VIBRAMYCIN) 100 MG capsule Take 1 capsule (100 mg total) by mouth 2 (two) times daily. One po bid x 7 days 14 capsule 0   gabapentin (NEURONTIN) 100 MG capsule Take 1 capsule (100 mg total) by mouth at bedtime. 30 capsule 0   glucose blood (TRUE METRIX BLOOD GLUCOSE TEST) test strip Use as instructed 100 each 12   Insulin Pen Needle (PEN NEEDLES) 31G X 8 MM MISC UAD 100 each 0   lidocaine (LIDODERM) 5 % Place 1 patch onto the skin daily. Remove & Discard patch within 12 hours or as directed by MD 30 patch 0   omeprazole (PRILOSEC) 40 MG capsule Take 40 mg by mouth daily.     ondansetron (ZOFRAN-ODT) 8 MG disintegrating tablet Take 1 tablet (  8 mg total) by mouth every 8 (eight) hours as needed for nausea or vomiting. 10 tablet 0   TRUEplus Lancets 28G MISC Use as directed 100 each 4   [DISCONTINUED] pantoprazole (PROTONIX) 20 MG tablet Take 1 tablet (20 mg total) by mouth daily. 30 tablet 0   No current facility-administered medications on file prior to visit.    Allergies  Allergen Reactions   Diclofenac Other  (See Comments)    "red man syndrome"   Vancomycin Other (See Comments)    Red man syndrome   Adhesive [Tape] Itching and Rash    Social History   Socioeconomic History   Marital status: Married    Spouse name: Not on file   Number of children: Not on file   Years of education: Not on file   Highest education level: Not on file  Occupational History   Not on file  Tobacco Use   Smoking status: Former    Packs/day: 0.50    Years: 2.00    Pack years: 1.00    Types: Cigarettes    Quit date: 07/22/2011    Years since quitting: 9.8    Passive exposure: Past   Smokeless tobacco: Never  Vaping Use   Vaping Use: Never used  Substance and Sexual Activity   Alcohol use: Yes    Alcohol/week: 0.0 standard drinks    Comment: occ   Drug use: No   Sexual activity: Not on file  Other Topics Concern   Not on file  Social History Narrative   Not on file   Social Determinants of Health   Financial Resource Strain: Not on file  Food Insecurity: Not on file  Transportation Needs: Not on file  Physical Activity: Not on file  Stress: Not on file  Social Connections: Not on file  Intimate Partner Violence: Not on file    Family History  Problem Relation Age of Onset   Cancer Mother    Diabetes Mother    Hypertension Mother     Past Surgical History:  Procedure Laterality Date   ABLATION     CARPAL TUNNEL RELEASE Left 09/13/2016   Procedure: LEFT CARPAL [place];  Surgeon: Daryll Brod, MD;  Location: [place];  Service: Orthopedics;  Laterality: Left;   CHOLECYSTECTOMY     KNEE ARTHROSCOPY     LAPAROSCOPIC TUBAL LIGATION  04/29/2011   Procedure: LAPAROSCOPIC TUBAL LIGATION;  Surgeon: Luz Lex, MD;  Location: [facility];  Service: Gynecology;  Laterality: Bilateral;  with Filshie Clips   ROTATOR CUFF REPAIR     TUBAL LIGATION      ROS: Review of Systems Negative except as stated above  PHYSICAL EXAM: BP 121/83 (BP Location: Left Arm, Patient Position: Sitting, Cuff  Size: Normal)    Pulse 96    Temp 98.3 F (36.8 C)    Resp 18    Ht 5' 0.98" (1.549 m)    Wt 284 lb (128.8 kg)    SpO2 95%    BMI 53.69 kg/m   Physical Exam HENT:     Head: Normocephalic and atraumatic.     Right Ear: Tympanic membrane, ear canal and external ear normal.     Left Ear: Tympanic membrane, ear canal and external ear normal.  Eyes:     Extraocular Movements: Extraocular movements intact.     Conjunctiva/sclera: Conjunctivae normal.     Pupils: Pupils are equal, round, and reactive to light.  Neck:     Comments: Nodule left upper  neck. Cardiovascular:     Rate and Rhythm: Normal rate and regular rhythm.     Pulses: Normal pulses.     Heart sounds: Normal heart sounds.  Pulmonary:     Effort: Pulmonary effort is normal.     Breath sounds: Normal breath sounds.  Musculoskeletal:     Cervical back: Normal range of motion and neck supple. Tenderness present.  Neurological:     General: No focal deficit present.     Mental Status: She is alert and oriented to person, place, and time.  Psychiatric:        Mood and Affect: Mood normal.        Behavior: Behavior normal.   Results for orders placed or performed in visit on 06/01/21  TSH  Result Value Ref Range   TSH 2.200 0.450 - 4.500 uIU/mL  POCT glycosylated hemoglobin (Hb A1C)  Result Value Ref Range   Hemoglobin A1C 10.5 (A) 4.0 - 5.6 %   HbA1c POC (<> result, manual entry)     HbA1c, POC (prediabetic range)     HbA1c, POC (controlled diabetic range)      ASSESSMENT AND PLAN: 1. Type 2 diabetes mellitus without complication, with long-term current use of insulin ([place]): - Hemoglobin A1c today not at goal at 10.5%, goal < 7%. Patient endorses dietary indiscretion. - Continue Metformin and Glimepiride as prescribed.  - Increase Insulin Glargine from 16 units daily to 19 units daily for 7 days then 21 units daily continuing.  - Discussed the importance of healthy eating habits, low-carbohydrate diet, low-sugar  diet, regular aerobic exercise (at least 150 minutes a week as tolerated) and medication compliance to achieve or maintain control of diabetes. - Referral to Endocrinology for further evaluation and management.  - Follow-up with primary provider as scheduled. - POCT glycosylated hemoglobin (Hb A1C) - Ambulatory referral to Endocrinology - metFORMIN (GLUCOPHAGE-XR) 500 MG 24 hr tablet; Take 2 tablets (1,000 mg total) by mouth 2 (two) times daily with a meal.  Dispense: 360 tablet; Refill: 0 - glimepiride (AMARYL) 1 MG tablet; Take 1 tablet (1 mg total) by mouth 2 (two) times daily with a meal.  Dispense: 180 tablet; Refill: 0 - insulin glargine (LANTUS) 100 UNIT/ML Solostar Pen; Inject 19 Units into the skin at bedtime for 7 days, THEN 21 Units at bedtime.  Dispense: 15 mL; Refill: 0  2. Thyroid disorder screen: - TSH to check thyroid function.  - TSH  3. Irritant contact dermatitis due to other agents: - Overall improved.  - Patient declined referral to Dermatology.  - Follow-up with primary provider as scheduled.     Patient was given the opportunity to ask questions.  Patient verbalized understanding of the plan and was able to repeat key elements of the plan. Patient was given clear instructions to go to Emergency Department or return to medical center if symptoms don't improve, worsen, or new problems develop.The patient verbalized understanding.   Orders Placed This Encounter  Procedures   TSH   Ambulatory referral to Endocrinology   POCT glycosylated hemoglobin (Hb A1C)     Requested Prescriptions   Signed Prescriptions Disp Refills   metFORMIN (GLUCOPHAGE-XR) 500 MG 24 hr tablet 360 tablet 0    Sig: Take 2 tablets (1,000 mg total) by mouth 2 (two) times daily with a meal.   glimepiride (AMARYL) 1 MG tablet 180 tablet 0    Sig: Take 1 tablet (1 mg total) by mouth 2 (two) times daily with a meal.  insulin glargine (LANTUS) 100 UNIT/ML Solostar Pen 15 mL 0    Sig: Inject  19 Units into the skin at bedtime for 7 days, THEN 21 Units at bedtime.    Follow-up with primary provider as scheduled.  Camillia Herter, NP

## 2021-06-01 ENCOUNTER — Encounter: Payer: Self-pay | Admitting: Family

## 2021-06-01 ENCOUNTER — Other Ambulatory Visit: Payer: Self-pay

## 2021-06-01 ENCOUNTER — Ambulatory Visit (INDEPENDENT_AMBULATORY_CARE_PROVIDER_SITE_OTHER): Payer: 59 | Admitting: Family

## 2021-06-01 VITALS — BP 121/83 | HR 96 | Temp 98.3°F | Resp 18 | Ht 60.98 in | Wt 284.0 lb

## 2021-06-01 DIAGNOSIS — Z794 Long term (current) use of insulin: Secondary | ICD-10-CM

## 2021-06-01 DIAGNOSIS — Z1329 Encounter for screening for other suspected endocrine disorder: Secondary | ICD-10-CM

## 2021-06-01 DIAGNOSIS — L2489 Irritant contact dermatitis due to other agents: Secondary | ICD-10-CM

## 2021-06-01 DIAGNOSIS — E119 Type 2 diabetes mellitus without complications: Secondary | ICD-10-CM

## 2021-06-01 DIAGNOSIS — R21 Rash and other nonspecific skin eruption: Secondary | ICD-10-CM

## 2021-06-01 LAB — POCT GLYCOSYLATED HEMOGLOBIN (HGB A1C): Hemoglobin A1C: 10.5 % — AB (ref 4.0–5.6)

## 2021-06-01 MED ORDER — METFORMIN HCL ER 500 MG PO TB24: 1000.0000 mg | ORAL_TABLET | Freq: Two times a day (BID) | ORAL | 0 refills | Status: DC

## 2021-06-01 MED ORDER — INSULIN GLARGINE 100 UNIT/ML SOLOSTAR PEN: PEN_INJECTOR | SUBCUTANEOUS | 0 refills | Status: DC

## 2021-06-01 MED ORDER — GLIMEPIRIDE 1 MG PO TABS: 1.0000 mg | ORAL_TABLET | Freq: Two times a day (BID) | ORAL | 0 refills | Status: DC

## 2021-06-01 NOTE — Progress Notes (Signed)
Diabetes discussed in office.

## 2021-06-01 NOTE — Progress Notes (Signed)
Pt presents for diabetes follow-up, A1c up from 9.1% to 10.5% Pt complains that knot behind left ear is painful and been there for 2 weeks

## 2021-06-02 ENCOUNTER — Other Ambulatory Visit: Payer: Self-pay | Admitting: Family

## 2021-06-02 ENCOUNTER — Telehealth: Payer: Self-pay | Admitting: Family

## 2021-06-02 DIAGNOSIS — M542 Cervicalgia: Secondary | ICD-10-CM

## 2021-06-02 DIAGNOSIS — R221 Localized swelling, mass and lump, neck: Secondary | ICD-10-CM

## 2021-06-02 LAB — TSH: TSH: 2.2 u[IU]/mL (ref 0.450–4.500)

## 2021-06-02 NOTE — Telephone Encounter (Signed)
Pharmacy was out of Lantus substitution is [facility] per [facility]

## 2021-06-02 NOTE — Progress Notes (Signed)
Thyroid function normal. A neck ultrasound added for good measure. CMA will call with appointment details soon.

## 2021-06-02 NOTE — Telephone Encounter (Signed)
Pt states she picked up medications and saw a new pen [facility] and is asking how to take this along w/ Lantus.

## 2021-06-11 ENCOUNTER — Ambulatory Visit (HOSPITAL_COMMUNITY)
Admission: RE | Admit: 2021-06-11 | Discharge: 2021-06-11 | Disposition: A | Discharge status: Home / Self Care | Payer: 59 | Source: Ambulatory Visit | Attending: Family | Admitting: Family

## 2021-06-11 ENCOUNTER — Other Ambulatory Visit: Payer: Self-pay

## 2021-06-11 DIAGNOSIS — M542 Cervicalgia: Secondary | ICD-10-CM | POA: Diagnosis present

## 2021-06-11 DIAGNOSIS — R221 Localized swelling, mass and lump, neck: Secondary | ICD-10-CM | POA: Diagnosis not present

## 2021-06-11 NOTE — Progress Notes (Signed)
Normal lymph node left neck. Follow-up with primary provider as needed.

## 2021-07-01 ENCOUNTER — Ambulatory Visit: Payer: Self-pay | Admitting: *Deleted

## 2021-07-01 ENCOUNTER — Encounter (HOSPITAL_BASED_OUTPATIENT_CLINIC_OR_DEPARTMENT_OTHER): Payer: Self-pay | Admitting: *Deleted

## 2021-07-01 ENCOUNTER — Other Ambulatory Visit: Payer: Self-pay

## 2021-07-01 ENCOUNTER — Emergency Department (HOSPITAL_BASED_OUTPATIENT_CLINIC_OR_DEPARTMENT_OTHER)
Admission: EM | Admit: 2021-07-01 | Discharge: 2021-07-01 | Disposition: A | Discharge status: Home / Self Care | Payer: 59 | Attending: Emergency Medicine | Admitting: Emergency Medicine

## 2021-07-01 DIAGNOSIS — Z794 Long term (current) use of insulin: Secondary | ICD-10-CM | POA: Insufficient documentation

## 2021-07-01 DIAGNOSIS — E119 Type 2 diabetes mellitus without complications: Secondary | ICD-10-CM | POA: Insufficient documentation

## 2021-07-01 DIAGNOSIS — Z7984 Long term (current) use of oral hypoglycemic drugs: Secondary | ICD-10-CM | POA: Insufficient documentation

## 2021-07-01 DIAGNOSIS — H53141 Visual discomfort, right eye: Secondary | ICD-10-CM | POA: Insufficient documentation

## 2021-07-01 MED ORDER — ERYTHROMYCIN 5 MG/GM OP OINT
TOPICAL_OINTMENT | Freq: Four times a day (QID) | OPHTHALMIC | Status: DC
  Filled 2021-07-01: qty 3.5

## 2021-07-01 NOTE — Telephone Encounter (Signed)
?  Chief Complaint: severe eye pain ?Symptoms: pain, swelling, watering ?Frequency: started yesterday ?Pertinent Negatives: Patient denies fever,headache, nasal discharge, facial rash ?Disposition: [x] ED /[] Urgent Care (no appt availability in office) / [] Appointment(In office/virtual)/ []  [place] [facility]/ [] Home Care/ [] Refused Recommended Disposition /[] [place] [facility]/ []  Follow-up with PCP ?Additional Notes: Due to severity of pain- advised ED  ?

## 2021-07-01 NOTE — ED Triage Notes (Signed)
Since yesterday she has had a rash below her right eye. Soreness to her eyelid.  ?

## 2021-07-01 NOTE — ED Provider Notes (Signed)
?[name] ?Provider Note ? ? ?CSN: [id number] ?Arrival date & time: 07/01/21  1857 ? ?  ? ?History ? ?No chief complaint on file. ? ? ?[name] is a 51 y.o. female. ? ?The history is provided by the patient.  ?[name] is a 51 y.o. female who presents to the Emergency Department complaining of eye itching.  She presents the emergency department complaining of 1 day of itching and redness and swelling to the right eyelid.  No reports of new exposures to medications, plants, detergents or soaps.  She states that yesterday it was more swollen than today.  She states that it does sometimes hurt a little bit.  No change in vision.  She wears reading glasses but otherwise no corrective lenses.  She has a history of diabetes, no additional medical problems.  Symptoms are improving. ?  ? ?Home Medications ?Prior to Admission medications   ?Medication Sig Start Date End Date Taking? Authorizing Provider  ?traMADol (ULTRAM) 50 MG tablet Take 1 tablet (50 mg total) by mouth every 12 (twelve) hours as needed. 10/12/20 10/12/21  Magnant, Gerrianne Scale, PA-C  ?Blood Glucose Monitoring Suppl (TRUE METRIX METER) w/Device KIT Use as directed 11/10/20   Camillia Herter, NP  ?doxycycline (VIBRAMYCIN) 100 MG capsule Take 1 capsule (100 mg total) by mouth 2 (two) times daily. One po bid x 7 days 04/06/21   Camillia Herter, NP  ?gabapentin (NEURONTIN) 100 MG capsule Take 1 capsule (100 mg total) by mouth at bedtime. 01/15/21 02/14/21  Camillia Herter, NP  ?glimepiride (AMARYL) 1 MG tablet Take 1 tablet (1 mg total) by mouth 2 (two) times daily with a meal. 06/01/21 08/30/21  Camillia Herter, NP  ?glucose blood (TRUE METRIX BLOOD GLUCOSE TEST) test strip Use as instructed 11/10/20   Camillia Herter, NP  ?insulin glargine (LANTUS) 100 UNIT/ML Solostar Pen Inject 19 Units into the skin at bedtime for 7 days, THEN 21 Units at bedtime. 06/01/21 08/30/21  Camillia Herter, NP  ?Insulin Pen Needle (PEN NEEDLES)  31G X 8 MM MISC UAD 03/01/21   Camillia Herter, NP  ?lidocaine (LIDODERM) 5 % Place 1 patch onto the skin daily. Remove & Discard patch within 12 hours or as directed by MD 12/30/20   Loni Beckwith, PA-C  ?metFORMIN (GLUCOPHAGE-XR) 500 MG 24 hr tablet Take 2 tablets (1,000 mg total) by mouth 2 (two) times daily with a meal. 06/01/21 08/30/21  Camillia Herter, NP  ?omeprazole (PRILOSEC) 40 MG capsule Take 40 mg by mouth daily.    [provider]  ?ondansetron (ZOFRAN-ODT) 8 MG disintegrating tablet Take 1 tablet (8 mg total) by mouth every 8 (eight) hours as needed for nausea or vomiting. 04/02/21   Molpus, John, MD  ?TRUEplus Lancets 28G MISC Use as directed 11/10/20   Camillia Herter, NP  ?pantoprazole (PROTONIX) 20 MG tablet Take 1 tablet (20 mg total) by mouth daily. 05/26/17 06/27/20  Malvin Johns, MD  ?   ? ?Allergies    ?Diclofenac, Vancomycin, and Adhesive [tape]   ? ?Review of Systems   ?Review of Systems  ?All other systems reviewed and are negative. ? ?Physical Exam ?Updated Vital Signs ?BP 139/79 (BP Location: Right Arm)   Pulse (!) 114   Temp 98.4 ?F (36.9 ?C) (Oral)   Resp 18   Ht 5' (1.524 m)   Wt 128.8 kg   SpO2 98%   BMI 55.46 kg/m?  ?Physical Exam ?Vitals  and nursing note reviewed.  ?Constitutional:   ?   Appearance: She is well-developed.  ?HENT:  ?   Head: Normocephalic and atraumatic.  ?   Comments: Pupils equal round and reactive.  EOMI.  There is mild conjunctival injection bilaterally.  Visual fields are grossly intact.  There is mild erythema with faint crusting to the right lateral lower eyelid.  There are scattered macules to the cheeks bilaterally. ?Cardiovascular:  ?   Rate and Rhythm: Normal rate and regular rhythm.  ?Pulmonary:  ?   Effort: Pulmonary effort is normal. No respiratory distress.  ?Musculoskeletal:     ?   General: No tenderness.  ?Skin: ?   General: Skin is warm and dry.  ?Neurological:  ?   Mental Status: She is alert and oriented to person, place, and  time.  ?Psychiatric:     ?   Behavior: Behavior normal.  ? ? ?ED Results / Procedures / Treatments   ?Labs ?(all labs ordered are listed, but only abnormal results are displayed) ?Labs Reviewed - No data to display ? ?EKG ?None ? ?Radiology ?No results found. ? ?Procedures ?Procedures  ? ? ?Medications Ordered in ED ?Medications  ?erythromycin ophthalmic ointment (has no administration in time range)  ? ? ?ED Course/ Medical Decision Making/ A&P ?  ?                        ?Medical Decision Making ?Risk ?Prescription drug management. ? ? ?Patient with history of diabetes here for evaluation of irritation to the eye.  No evidence of zoster, foreign body, cellulitis, abscess.  Question if there is some irritant or allergic component to her rash.  Given her diabetes will recommend erythromycin ointment to the erythematous region with local skin care with baby shampoo.  Discussed outpatient follow-up and return precautions. ? ? ? ? ? ? ? ?Final Clinical Impression(s) / ED Diagnoses ?Final diagnoses:  ?Irritation of eyelid  ? ? ?Rx / DC Orders ?ED Discharge Orders   ? ? None  ? ?  ? ? ?  ?Quintella Reichert, MD ?07/01/21 2311 ? ?

## 2021-07-01 NOTE — Discharge Instructions (Signed)
Wash your eyes with Laural Benes Laural Benes no more tears baby shampoo twice daily.  You can apply the eye ointment 4 times daily to the eyelid for the next 4 to 5 days.  If you have itching you can take Benadryl, available over-the-counter according to label instructions.  Try not to rub your eyes.  Get rechecked immediately if you develop fevers, severe pain to your eye or new concerning symptoms.  Otherwise you can follow-up with your doctor in the next week for recheck. ?

## 2021-07-01 NOTE — Telephone Encounter (Signed)
Reason for Disposition ? SEVERE eye pain ? ?Answer Assessment - Initial Assessment Questions ?1. ONSET: "When did the pain start?" (e.g., minutes, hours, days) ?    yesterday ?2. TIMING: "Does the pain come and go, or has it been constant since it started?" (e.g., constant, intermittent, fleeting) ?    constant ?3. SEVERITY: "How bad is the pain?"   (Scale 1-10; mild, moderate or severe) ?  - MILD (1-3): doesn't interfere with normal activities  ?  - MODERATE (4-7): interferes with normal activities or awakens from sleep  ?  - SEVERE (8-10): excruciating pain and patient unable to do normal activities ?    severe ?4. LOCATION: "Where does it hurt?"  (e.g., eyelid, eye, cheekbone) ?    R eye-whole eye ?5. CAUSE: "What do you think is causing the pain?" ?    unsure ?6. VISION: "Do you have blurred vision or changes in your vision?"  ?    Little blurred ?7. EYE DISCHARGE: "Is there any discharge (pus) from the eye(s)?"  If yes, ask: "What color is it?"  ?    Watering- in morning- a lot of mucus ?8. FEVER: "Do you have a fever?" If Yes, ask: "What is it, how was it measured, and when did it start?"  ?    Possible yesterday ?9. OTHER SYMPTOMS: "Do you have any other symptoms?" (e.g., headache, nasal discharge, facial rash) ?    no ?10. PREGNANCY: "Is there any chance you are pregnant?" "When was your last menstrual period?" ?      *No Answer* ? ?Protocols used: Eye Pain and Other Symptoms-A-AH ? ?

## 2021-07-06 ENCOUNTER — Other Ambulatory Visit: Payer: Self-pay | Admitting: Family

## 2021-07-06 DIAGNOSIS — E119 Type 2 diabetes mellitus without complications: Secondary | ICD-10-CM

## 2021-08-03 ENCOUNTER — Encounter (HOSPITAL_BASED_OUTPATIENT_CLINIC_OR_DEPARTMENT_OTHER): Payer: Self-pay | Admitting: Emergency Medicine

## 2021-08-03 ENCOUNTER — Emergency Department (HOSPITAL_BASED_OUTPATIENT_CLINIC_OR_DEPARTMENT_OTHER)
Admission: EM | Admit: 2021-08-03 | Discharge: 2021-08-03 | Disposition: A | Discharge status: Home / Self Care | Payer: 59 | Attending: Emergency Medicine | Admitting: Emergency Medicine

## 2021-08-03 ENCOUNTER — Emergency Department (HOSPITAL_BASED_OUTPATIENT_CLINIC_OR_DEPARTMENT_OTHER): Payer: 59

## 2021-08-03 ENCOUNTER — Other Ambulatory Visit: Payer: Self-pay

## 2021-08-03 DIAGNOSIS — Z794 Long term (current) use of insulin: Secondary | ICD-10-CM | POA: Insufficient documentation

## 2021-08-03 DIAGNOSIS — J189 Pneumonia, unspecified organism: Secondary | ICD-10-CM | POA: Insufficient documentation

## 2021-08-03 DIAGNOSIS — Z7984 Long term (current) use of oral hypoglycemic drugs: Secondary | ICD-10-CM | POA: Diagnosis not present

## 2021-08-03 DIAGNOSIS — Z20822 Contact with and (suspected) exposure to covid-19: Secondary | ICD-10-CM | POA: Diagnosis not present

## 2021-08-03 DIAGNOSIS — R059 Cough, unspecified: Secondary | ICD-10-CM | POA: Diagnosis present

## 2021-08-03 LAB — RESP PANEL BY RT-PCR (FLU A&B, COVID) ARPGX2
Influenza A by PCR: NEGATIVE
Influenza B by PCR: NEGATIVE
SARS Coronavirus 2 by RT PCR: NEGATIVE

## 2021-08-03 MED ORDER — DOXYCYCLINE HYCLATE 100 MG PO TABS
100.0000 mg | ORAL_TABLET | Freq: Once | ORAL | Status: AC
  Administered 2021-08-03: 100 mg via ORAL
  Filled 2021-08-03: qty 1

## 2021-08-03 MED ORDER — ALBUTEROL SULFATE HFA 108 (90 BASE) MCG/ACT IN AERS
2.0000 | INHALATION_SPRAY | Freq: Once | RESPIRATORY_TRACT | Status: AC
  Administered 2021-08-03: 2 via RESPIRATORY_TRACT
  Filled 2021-08-03: qty 6.7

## 2021-08-03 MED ORDER — DOXYCYCLINE HYCLATE 100 MG PO CAPS: 100.0000 mg | ORAL_CAPSULE | Freq: Two times a day (BID) | ORAL | 0 refills | Status: DC

## 2021-08-03 NOTE — ED Provider Notes (Signed)
?[facility] ?Provider Note ? ? ?CSN: [id number] ?Arrival date & time: 08/03/21  1826 ? ?  ? ?History ? ?Chief Complaint  ?Patient presents with  ? Cough  ? ? ?[name] is a 51 y.o. female. ? ?Pt is a 51 yo female presenting for cough, chills, and diarrhea x 3 days. Denies abdominal pain. Denies sick contacts. Denies recent infections or antibiotic use.  ? ?The history is provided by the patient. No language interpreter was used.  ?Cough ?Associated symptoms: chills   ?Associated symptoms: no chest pain, no ear pain, no fever, no rash, no shortness of breath and no sore throat   ? ?  ? ?Home Medications ?Prior to Admission medications   ?Medication Sig Start Date End Date Taking? Authorizing Provider  ?doxycycline (VIBRAMYCIN) 100 MG capsule Take 1 capsule (100 mg total) by mouth 2 (two) times daily. 9/70/26  Yes Campbell Stall P, DO  ?traMADol (ULTRAM) 50 MG tablet Take 1 tablet (50 mg total) by mouth every 12 (twelve) hours as needed. 10/12/20 10/12/21  Magnant, Gerrianne Scale, PA-C  ?Blood Glucose Monitoring Suppl (TRUE METRIX METER) w/Device KIT Use as directed 11/10/20   Camillia Herter, NP  ?gabapentin (NEURONTIN) 100 MG capsule Take 1 capsule (100 mg total) by mouth at bedtime. 01/15/21 02/14/21  Camillia Herter, NP  ?glimepiride (AMARYL) 1 MG tablet Take 1 tablet (1 mg total) by mouth 2 (two) times daily with a meal. 06/01/21 08/30/21  Camillia Herter, NP  ?glucose blood (TRUE METRIX BLOOD GLUCOSE TEST) test strip Use as instructed 11/10/20   Camillia Herter, NP  ?Insulin Pen Needle (PEN NEEDLES) 31G X 8 MM MISC UAD 03/01/21   Camillia Herter, NP  ?LANTUS SOLOSTAR 100 UNIT/ML Solostar Pen ADMINISTER 13 UNITS UNDER THE SKIN AT BEDTIME 07/06/21   Camillia Herter, NP  ?lidocaine (LIDODERM) 5 % Place 1 patch onto the skin daily. Remove & Discard patch within 12 hours or as directed by MD 12/30/20   Loni Beckwith, PA-C  ?metFORMIN (GLUCOPHAGE-XR) 500 MG 24 hr tablet Take 2 tablets  (1,000 mg total) by mouth 2 (two) times daily with a meal. 06/01/21 08/30/21  Camillia Herter, NP  ?omeprazole (PRILOSEC) 40 MG capsule Take 40 mg by mouth daily.    [provider]  ?ondansetron (ZOFRAN-ODT) 8 MG disintegrating tablet Take 1 tablet (8 mg total) by mouth every 8 (eight) hours as needed for nausea or vomiting. 04/02/21   Molpus, John, MD  ?TRUEplus Lancets 28G MISC Use as directed 11/10/20   Camillia Herter, NP  ?pantoprazole (PROTONIX) 20 MG tablet Take 1 tablet (20 mg total) by mouth daily. 05/26/17 06/27/20  Malvin Johns, MD  ?   ? ?Allergies    ?Diclofenac, Vancomycin, and Adhesive [tape]   ? ?Review of Systems   ?Review of Systems  ?Constitutional:  Positive for chills. Negative for fever.  ?HENT:  Negative for ear pain and sore throat.   ?Eyes:  Negative for pain and visual disturbance.  ?Respiratory:  Positive for cough. Negative for shortness of breath.   ?Cardiovascular:  Negative for chest pain and palpitations.  ?Gastrointestinal:  Positive for diarrhea. Negative for abdominal pain and vomiting.  ?Genitourinary:  Negative for dysuria and hematuria.  ?Musculoskeletal:  Negative for arthralgias and back pain.  ?Skin:  Negative for color change and rash.  ?Neurological:  Negative for seizures and syncope.  ?All other systems reviewed and are negative. ? ?Physical Exam ?Updated Vital  Signs ?BP 109/70 (BP Location: Right Arm)   Pulse 97   Temp 98.3 ?F (36.8 ?C) (Oral)   Resp 16   SpO2 96%  ?Physical Exam ?Vitals and nursing note reviewed.  ?Constitutional:   ?   General: She is not in acute distress. ?   Appearance: She is well-developed.  ?HENT:  ?   Head: Normocephalic and atraumatic.  ?Eyes:  ?   Conjunctiva/sclera: Conjunctivae normal.  ?Cardiovascular:  ?   Rate and Rhythm: Normal rate and regular rhythm.  ?   Heart sounds: No murmur heard. ?Pulmonary:  ?   Effort: Pulmonary effort is normal. No respiratory distress.  ?   Breath sounds: Normal breath sounds.  ?Abdominal:  ?    Palpations: Abdomen is soft.  ?   Tenderness: There is no abdominal tenderness.  ?Musculoskeletal:     ?   General: No swelling.  ?   Cervical back: Neck supple.  ?Skin: ?   General: Skin is warm and dry.  ?   Capillary Refill: Capillary refill takes less than 2 seconds.  ?Neurological:  ?   Mental Status: She is alert.  ?Psychiatric:     ?   Mood and Affect: Mood normal.  ? ? ?ED Results / Procedures / Treatments   ?Labs ?(all labs ordered are listed, but only abnormal results are displayed) ?Labs Reviewed  ?RESP PANEL BY RT-PCR (FLU A&B, COVID) ARPGX2  ? ? ?EKG ?None ? ?Radiology ?DG Chest 2 View ? ?Result Date: 08/03/2021 ?CLINICAL DATA:  Cough. EXAM: CHEST - 2 VIEW COMPARISON:  Chest radiograph dated 04/06/2021. FINDINGS: Diffuse increased interstitial prominence with a cluster of nodularity in the right mid lung field. Findings may represent edema or developing pneumonia. Clinical correlation recommended. No focal consolidation, pleural effusion, or pneumothorax. The cardiac silhouette is within normal limits. No acute osseous pathology. IMPRESSION: Cluster of nodularity in the right mid lung field may represent developing infiltrate. Clinical correlation is recommended. Electronically Signed   By: Anner Crete M.D.   On: 08/03/2021 19:14   ? ?Procedures ?Procedures  ? ? ?Medications Ordered in ED ?Medications  ?albuterol (VENTOLIN HFA) 108 (90 Base) MCG/ACT inhaler 2 puff (has no administration in time range)  ?doxycycline (VIBRA-TABS) tablet 100 mg (has no administration in time range)  ? ? ?ED Course/ Medical Decision Making/ A&P ?  ?                        ?Medical Decision Making ?Amount and/or Complexity of Data Reviewed ?Radiology: ordered. ? ?Risk ?Prescription drug management. ? ? ?Patient is a 51 year old female presenting for cough x3 days.  Exam patient is alert and oriented x3, no acute distress, afebrile, stable vital signs.  Physical exam demonstrates no signs or symptoms of sepsis.  Chest  x-ray concerning for possible developing infiltrates.  Wheezing on exam.  Albuterol given in ED and for home use.  XT cyclin given for likely developing pneumonia.  Otherwise stable with no signs of respiratory distress or hypoxia and able to return home at this time. ? ?Patient in no distress and overall condition improved here in the ED. Detailed discussions were had with the patient regarding current findings, and need for close f/u with PCP or on call doctor. The patient has been instructed to return immediately if the symptoms worsen in any way for re-evaluation. Patient verbalized understanding and is in agreement with current care plan. All questions answered prior to discharge. ? ? ? ? ? ? ? ? ?  Final Clinical Impression(s) / ED Diagnoses ?Final diagnoses:  ?Community acquired pneumonia, unspecified laterality  ? ? ?Rx / DC Orders ?ED Discharge Orders   ? ?      Ordered  ?  doxycycline (VIBRAMYCIN) 100 MG capsule  2 times daily       ? 08/03/21 2118  ? ?  ?  ? ?  ? ? ?  ?Lianne Cure, DO ?01/00/71 2118 ? ?

## 2021-08-03 NOTE — ED Triage Notes (Signed)
Pt reports cough, body aches, chills, and nausea.  ?

## 2021-09-06 ENCOUNTER — Other Ambulatory Visit: Payer: Self-pay | Admitting: Family

## 2021-09-06 DIAGNOSIS — E119 Type 2 diabetes mellitus without complications: Secondary | ICD-10-CM

## 2021-09-30 ENCOUNTER — Encounter (HOSPITAL_BASED_OUTPATIENT_CLINIC_OR_DEPARTMENT_OTHER): Payer: Self-pay

## 2021-09-30 ENCOUNTER — Emergency Department (HOSPITAL_BASED_OUTPATIENT_CLINIC_OR_DEPARTMENT_OTHER)
Admission: EM | Admit: 2021-09-30 | Discharge: 2021-09-30 | Disposition: A | Discharge status: Home / Self Care | Payer: 59 | Attending: Emergency Medicine | Admitting: Emergency Medicine

## 2021-09-30 DIAGNOSIS — Z794 Long term (current) use of insulin: Secondary | ICD-10-CM | POA: Insufficient documentation

## 2021-09-30 DIAGNOSIS — Z7984 Long term (current) use of oral hypoglycemic drugs: Secondary | ICD-10-CM | POA: Diagnosis not present

## 2021-09-30 DIAGNOSIS — E119 Type 2 diabetes mellitus without complications: Secondary | ICD-10-CM | POA: Insufficient documentation

## 2021-09-30 DIAGNOSIS — H9191 Unspecified hearing loss, right ear: Secondary | ICD-10-CM | POA: Diagnosis not present

## 2021-09-30 DIAGNOSIS — S161XXA Strain of muscle, fascia and tendon at neck level, initial encounter: Secondary | ICD-10-CM | POA: Diagnosis not present

## 2021-09-30 DIAGNOSIS — X503XXA Overexertion from repetitive movements, initial encounter: Secondary | ICD-10-CM | POA: Insufficient documentation

## 2021-09-30 DIAGNOSIS — S199XXA Unspecified injury of neck, initial encounter: Secondary | ICD-10-CM | POA: Diagnosis present

## 2021-09-30 DIAGNOSIS — H66001 Acute suppurative otitis media without spontaneous rupture of ear drum, right ear: Secondary | ICD-10-CM | POA: Diagnosis not present

## 2021-09-30 MED ORDER — CYCLOBENZAPRINE HCL 10 MG PO TABS: 10.0000 mg | ORAL_TABLET | Freq: Two times a day (BID) | ORAL | 0 refills | Status: DC | PRN

## 2021-09-30 MED ORDER — AMOXICILLIN-POT CLAVULANATE 875-125 MG PO TABS: 1.0000 | ORAL_TABLET | Freq: Two times a day (BID) | ORAL | 0 refills | Status: DC

## 2021-09-30 MED ORDER — ACETAMINOPHEN 500 MG PO TABS: 500.0000 mg | ORAL_TABLET | Freq: Four times a day (QID) | ORAL | 0 refills | Status: DC | PRN

## 2021-09-30 NOTE — ED Triage Notes (Signed)
C/o right sided ear pain and neck pain since Monday. States feels her right ear is "stopped up"

## 2021-09-30 NOTE — ED Provider Notes (Signed)
[facility] Provider Note   CSN: [id number] Arrival date & time: 09/30/21  0948     History  Chief Complaint  Patient presents with   Neck Pain    [name] is a 51 y.o. female.  HPI     51 year old female comes in with chief complaint of right-sided ear pain, neck pain.  Patient states that she woke up on Monday and felt like her right ear was plugged up.  She is also noticing some diminished hearing over that side subsequently.  Patient is having throbbing, in her ear type pain.  She has since started also having some neck pain.  Pain is worse with any kind of neck movement.  Patient denies any recent trauma.  Review of system is negative for any headaches, one-sided weakness, one-sided numbness, double vision, vertigo, slurred speech, balance issues.  She has noticed intermittent episodes of dizziness, but her gait has been normal.  Patient denies any history of similar pain.  Past medical history is positive for diabetes.    Home Medications Prior to Admission medications   Medication Sig Start Date End Date Taking? Authorizing Provider  acetaminophen (TYLENOL) 500 MG tablet Take 1 tablet (500 mg total) by mouth every 6 (six) hours as needed. 09/30/21  Yes Nanavati, Ankit, MD  traMADol (ULTRAM) 50 MG tablet Take 1 tablet (50 mg total) by mouth every 12 (twelve) hours as needed. 10/12/20 10/12/21  Magnant, Gerrianne Scale, PA-C  Blood Glucose Monitoring Suppl (TRUE METRIX METER) w/Device KIT Use as directed 11/10/20   Camillia Herter, NP  doxycycline (VIBRAMYCIN) 100 MG capsule Take 1 capsule (100 mg total) by mouth 2 (two) times daily. 9/93/71   Campbell Stall P, DO  gabapentin (NEURONTIN) 100 MG capsule Take 1 capsule (100 mg total) by mouth at bedtime. 01/15/21 02/14/21  Camillia Herter, NP  glimepiride (AMARYL) 1 MG tablet TAKE 1 TABLET(1 MG) BY MOUTH TWICE DAILY WITH A MEAL 09/07/21   Camillia Herter, NP  glucose blood (TRUE METRIX BLOOD GLUCOSE  TEST) test strip Use as instructed 11/10/20   Camillia Herter, NP  Insulin Pen Needle (PEN NEEDLES) 31G X 8 MM MISC UAD 03/01/21   Camillia Herter, NP  LANTUS SOLOSTAR 100 UNIT/ML Solostar Pen ADMINISTER 13 UNITS UNDER THE SKIN AT BEDTIME 07/06/21   Camillia Herter, NP  lidocaine (LIDODERM) 5 % Place 1 patch onto the skin daily. Remove & Discard patch within 12 hours or as directed by MD 12/30/20   Loni Beckwith, PA-C  metFORMIN (GLUCOPHAGE-XR) 500 MG 24 hr tablet TAKE 2 TABLETS(1000 MG) BY MOUTH TWICE DAILY WITH A MEAL 09/07/21   Camillia Herter, NP  omeprazole (PRILOSEC) 40 MG capsule Take 40 mg by mouth daily.    [provider]  ondansetron (ZOFRAN-ODT) 8 MG disintegrating tablet Take 1 tablet (8 mg total) by mouth every 8 (eight) hours as needed for nausea or vomiting. 04/02/21   Molpus, Jenny Reichmann, MD  TRUEplus Lancets 28G MISC Use as directed 11/10/20   Camillia Herter, NP  pantoprazole (PROTONIX) 20 MG tablet Take 1 tablet (20 mg total) by mouth daily. 05/26/17 06/27/20  Malvin Johns, MD      Allergies    Diclofenac, Vancomycin, and Adhesive [tape]    Review of Systems   Review of Systems  All other systems reviewed and are negative.   Physical Exam Updated Vital Signs BP 116/68   Pulse 80   Temp 98 F (36.7  C)   Resp 20   Ht 5' (1.524 m)   Wt 130 kg   SpO2 99%   BMI 55.97 kg/m  Physical Exam Vitals and nursing note reviewed.  Constitutional:      Appearance: She is well-developed.  HENT:     Head: Atraumatic.     Comments: Patient is right TM appears bulged, and there is a central opaque area with increase vascularity.  No clear air-fluid level  Patient has tender postauricular cervical lymphadenopathy and also tender cervical lymphadenopathy  No tenderness with palpation of the tragus or over the mastoid process Neck:     Comments: No meningismus, but reproducible tenderness with palpation of the sternocleidomastoid Cardiovascular:     Rate and Rhythm:  Normal rate.  Pulmonary:     Effort: Pulmonary effort is normal.  Musculoskeletal:     Cervical back: Normal range of motion and neck supple.  Skin:    General: Skin is warm and dry.  Neurological:     Mental Status: She is alert and oriented to person, place, and time.     Cranial Nerves: No cranial nerve deficit.     Sensory: No sensory deficit.     Motor: No weakness.     Coordination: Coordination normal.     ED Results / Procedures / Treatments   Labs (all labs ordered are listed, but only abnormal results are displayed) Labs Reviewed - No data to display  EKG None  Radiology No results found.  Procedures Procedures    Medications Ordered in ED Medications - No data to display  ED Course/ Medical Decision Making/ A&P                           Medical Decision Making Risk OTC drugs.   This patient presents to the ED with chief complaint(s) of ear pain, neck pain, hearing loss with pertinent past medical history of diabetes which further complicates the presenting complaint. The complaint involves an extensive differential diagnosis and also carries with it a high risk of complications and morbidity.    Patient is not toxic appearing.  She has no fever, no elevated heart rate.  On exam, no meningismus.  Patient does have some abnormal right TM finding as discussed in the physical exam.  She also has no focal neurodeficits, which is reassuring.  The differential diagnosis includes : Cervical strain, acute otitis media, tender cervical lymphadenopathy/lymphadenitis.  Clinically, patient does not have mastoiditis, ear cellulitis.  Patient has no focal neurodeficits, we considered vertebral dissection, aneurysm in the differential as well, but no tinnitus, no meningismus, no trauma and no neurodeficits, makes the possibility quite low.  The initial plan is to start patient on Augmentin and advised Tylenol with muscle relaxant.  She will also apply warm  compresses.  If patient is not getting better, she will follow-up with her PCP.  Given the hearing loss, we have also advised that she will need to follow-up with her ENT if the antibiotics are not helping.  I do not think lab work-up, imaging at this time will be helpful.  CT and labs were considered, but after discussion with the patient, weight watch approach has been selected.   Additional history obtained: Records reviewed recent imaging, including ultrasound on the left side for some neck nodule.  Current symptoms are right-sided.   Final Clinical Impression(s) / ED Diagnoses Final diagnoses:  Acute strain of neck muscle, initial encounter  Non-recurrent acute  suppurative otitis media of right ear without spontaneous rupture of tympanic membrane  Hearing loss of right ear, unspecified hearing loss type    Rx / DC Orders ED Discharge Orders          Ordered    acetaminophen (TYLENOL) 500 MG tablet  Every 6 hours PRN        09/30/21 1345              Varney Biles, MD 09/30/21 1358

## 2021-09-30 NOTE — Discharge Instructions (Addendum)
Given the hearing loss, throbbing ear pain, we suspect that you have ear infection.  Please take the antibiotics and Tylenol every 4-6 hours for pain control.  We recommend that you call your primary care doctor for a follow-up in 1 week. Given the hearing loss, ENT follow-up will be very helpful as well.  Please return to the ER if you start having severe headache, neck pain, dizziness, vision change, slurred speech, difficulty in swallowing.

## 2021-10-17 ENCOUNTER — Other Ambulatory Visit: Payer: Self-pay

## 2021-10-17 ENCOUNTER — Emergency Department (HOSPITAL_BASED_OUTPATIENT_CLINIC_OR_DEPARTMENT_OTHER)
Admission: EM | Admit: 2021-10-17 | Discharge: 2021-10-17 | Disposition: A | Discharge status: Home / Self Care | Payer: 59 | Attending: Emergency Medicine | Admitting: Emergency Medicine

## 2021-10-17 DIAGNOSIS — S161XXA Strain of muscle, fascia and tendon at neck level, initial encounter: Secondary | ICD-10-CM | POA: Diagnosis not present

## 2021-10-17 DIAGNOSIS — X58XXXA Exposure to other specified factors, initial encounter: Secondary | ICD-10-CM | POA: Diagnosis not present

## 2021-10-17 DIAGNOSIS — S199XXA Unspecified injury of neck, initial encounter: Secondary | ICD-10-CM | POA: Diagnosis present

## 2021-10-17 MED ORDER — HYDROCODONE-ACETAMINOPHEN 5-325 MG PO TABS
2.0000 | ORAL_TABLET | Freq: Once | ORAL | Status: AC
  Administered 2021-10-17: 2 via ORAL
  Filled 2021-10-17: qty 2

## 2021-10-17 NOTE — Discharge Instructions (Addendum)
Please use Tylenol or ibuprofen for pain.  You may use 600 mg ibuprofen every 6 hours or 1000 mg of Tylenol every 6 hours.  You may choose to alternate between the 2.  This would be most effective.  Not to exceed 4 g of Tylenol within 24 hours.  Not to exceed 3200 mg ibuprofen 24 hours.  You can use ice to the affected area, as well as the exercises I provided above. You can use the muscle relaxants you have at home for breakthrough pain.

## 2021-10-17 NOTE — ED Provider Notes (Signed)
[facility] Provider Note   CSN: [id number] Arrival date & time: 10/17/21  1749     History  Chief Complaint  Patient presents with   Neck Pain    [name] is a 51 y.o. female who presents with complaints of some left-sided neck pain and stiffness for 1 day.  Patient reports that she tried some Tylenol as well as muscle relaxant x1 without significant relief.  She denies fall, or other significant injury.  She denies any numbness, tingling, headache, vision changes.  She reports no known injury.  She has a history of cervical radiculopathy on previous evaluation.  She was seen recently around 3 weeks ago for right-sided neck pain and ear pain.  She reports that this resolved spontaneously after Augmentin and pain medicine, and this feels completely different, started just this morning.   Neck Pain      Home Medications Prior to Admission medications   Medication Sig Start Date End Date Taking? Authorizing Provider  acetaminophen (TYLENOL) 500 MG tablet Take 1 tablet (500 mg total) by mouth every 6 (six) hours as needed. 09/30/21   Varney Biles, MD  amoxicillin-clavulanate (AUGMENTIN) 875-125 MG tablet Take 1 tablet by mouth every 12 (twelve) hours. 09/30/21   Varney Biles, MD  Blood Glucose Monitoring Suppl (TRUE METRIX METER) w/Device KIT Use as directed 11/10/20   Camillia Herter, NP  cyclobenzaprine (FLEXERIL) 10 MG tablet Take 1 tablet (10 mg total) by mouth 2 (two) times daily as needed for muscle spasms. 09/30/21   Varney Biles, MD  gabapentin (NEURONTIN) 100 MG capsule Take 1 capsule (100 mg total) by mouth at bedtime. 01/15/21 02/14/21  Camillia Herter, NP  glimepiride (AMARYL) 1 MG tablet TAKE 1 TABLET(1 MG) BY MOUTH TWICE DAILY WITH A MEAL 09/07/21   Camillia Herter, NP  glucose blood (TRUE METRIX BLOOD GLUCOSE TEST) test strip Use as instructed 11/10/20   Camillia Herter, NP  Insulin Pen Needle (PEN NEEDLES) 31G X 8 MM MISC UAD  03/01/21   Camillia Herter, NP  LANTUS SOLOSTAR 100 UNIT/ML Solostar Pen ADMINISTER 13 UNITS UNDER THE SKIN AT BEDTIME 07/06/21   Camillia Herter, NP  lidocaine (LIDODERM) 5 % Place 1 patch onto the skin daily. Remove & Discard patch within 12 hours or as directed by MD 12/30/20   Loni Beckwith, PA-C  metFORMIN (GLUCOPHAGE-XR) 500 MG 24 hr tablet TAKE 2 TABLETS(1000 MG) BY MOUTH TWICE DAILY WITH A MEAL 09/07/21   Camillia Herter, NP  omeprazole (PRILOSEC) 40 MG capsule Take 40 mg by mouth daily.    [provider]  ondansetron (ZOFRAN-ODT) 8 MG disintegrating tablet Take 1 tablet (8 mg total) by mouth every 8 (eight) hours as needed for nausea or vomiting. 04/02/21   Molpus, Jenny Reichmann, MD  TRUEplus Lancets 28G MISC Use as directed 11/10/20   Camillia Herter, NP  pantoprazole (PROTONIX) 20 MG tablet Take 1 tablet (20 mg total) by mouth daily. 05/26/17 06/27/20  Malvin Johns, MD      Allergies    Diclofenac, Vancomycin, and Adhesive [tape]    Review of Systems   Review of Systems  Musculoskeletal:  Positive for neck pain.  All other systems reviewed and are negative.   Physical Exam Updated Vital Signs BP (!) 123/52 (BP Location: Right Arm)   Pulse (!) 104   Temp 98.2 F (36.8 C)   Resp 18   Ht 5' (1.524 m)   Wt 59 kg  SpO2 97%   BMI 25.39 kg/m  Physical Exam Vitals and nursing note reviewed.  Constitutional:      General: She is not in acute distress.    Appearance: Normal appearance.  HENT:     Head: Normocephalic and atraumatic.  Eyes:     General:        Right eye: No discharge.        Left eye: No discharge.  Cardiovascular:     Rate and Rhythm: Normal rate and regular rhythm.  Pulmonary:     Effort: Pulmonary effort is normal. No respiratory distress.  Musculoskeletal:        General: No deformity.     Comments: Intact and 5 out of 5 bilateral upper and lower extremities.  No numbness or tingling noted.  Some tenderness palpation of left paraspinous cervical  muscles, no midline spinal tenderness noted.  No step-offs or deformity.  Intact range of motion of the cervical spine.  Skin:    General: Skin is warm and dry.  Neurological:     Mental Status: She is alert and oriented to person, place, and time.  Psychiatric:        Mood and Affect: Mood normal.        Behavior: Behavior normal.     ED Results / Procedures / Treatments   Labs (all labs ordered are listed, but only abnormal results are displayed) Labs Reviewed - No data to display  EKG None  Radiology No results found.  Procedures Procedures    Medications Ordered in ED Medications  HYDROcodone-acetaminophen (NORCO/VICODIN) 5-325 MG per tablet 2 tablet (has no administration in time range)    ED Course/ Medical Decision Making/ A&P                           Medical Decision Making  This is an overall well-appearing 51 year old female with past medical history significant for previous cervical Radiculopathy who presents with concern for new onset left-sided neck pain that began this morning.  She denies traumatic injury.  She has no neurologic deficits on my exam, as well as no midline spinal tenderness.  She denies previous history of IV drug use, chronic corticosteroid use, previous cancer.  She has tried Tylenol, Bengay and muscle relaxant x1 with no significant relief.  Based on my physical exam findings patient's pain is consistent with a left-sided cervical strain, or other muscular injury, possibly exacerbated by previous degenerative neck changes, radiculopathy.  She is not having any significant radicular symptoms at this time.  We will help to control her pain in the emergency department, discharged with over-the-counter pain medicine control, encouraged her to take her home muscle relaxants, and encouraged her to follow-up with orthopedic at her earliest convenience. Final Clinical Impression(s) / ED Diagnoses Final diagnoses:  Acute strain of neck muscle, initial  encounter    Rx / DC Orders ED Discharge Orders     None         [name] 10/17/21 1941    Wyvonnia Dusky, MD 10/18/21 [phone]

## 2021-10-17 NOTE — ED Triage Notes (Signed)
Patient arrives with complaints worsening neck stiffness with difficulty moving neck x1 day. Patient states that the pain is radiating up behind her left ear. No recent neck injuries.   Rates pain a 9.5/10.

## 2021-10-25 ENCOUNTER — Encounter (HOSPITAL_BASED_OUTPATIENT_CLINIC_OR_DEPARTMENT_OTHER): Payer: Self-pay | Admitting: Emergency Medicine

## 2021-10-25 ENCOUNTER — Other Ambulatory Visit: Payer: Self-pay

## 2021-10-25 ENCOUNTER — Emergency Department (HOSPITAL_BASED_OUTPATIENT_CLINIC_OR_DEPARTMENT_OTHER)
Admission: EM | Admit: 2021-10-25 | Discharge: 2021-10-25 | Disposition: A | Discharge status: Home / Self Care | Payer: 59 | Attending: Emergency Medicine | Admitting: Emergency Medicine

## 2021-10-25 ENCOUNTER — Emergency Department (HOSPITAL_BASED_OUTPATIENT_CLINIC_OR_DEPARTMENT_OTHER): Payer: 59

## 2021-10-25 DIAGNOSIS — R110 Nausea: Secondary | ICD-10-CM | POA: Insufficient documentation

## 2021-10-25 DIAGNOSIS — Z794 Long term (current) use of insulin: Secondary | ICD-10-CM | POA: Insufficient documentation

## 2021-10-25 DIAGNOSIS — E119 Type 2 diabetes mellitus without complications: Secondary | ICD-10-CM | POA: Insufficient documentation

## 2021-10-25 DIAGNOSIS — R0789 Other chest pain: Secondary | ICD-10-CM | POA: Insufficient documentation

## 2021-10-25 DIAGNOSIS — Z7984 Long term (current) use of oral hypoglycemic drugs: Secondary | ICD-10-CM | POA: Diagnosis not present

## 2021-10-25 DIAGNOSIS — Z87891 Personal history of nicotine dependence: Secondary | ICD-10-CM | POA: Insufficient documentation

## 2021-10-25 DIAGNOSIS — R600 Localized edema: Secondary | ICD-10-CM | POA: Diagnosis not present

## 2021-10-25 DIAGNOSIS — R079 Chest pain, unspecified: Secondary | ICD-10-CM | POA: Diagnosis present

## 2021-10-25 LAB — COMPREHENSIVE METABOLIC PANEL
ALT: 19 U/L (ref 0–44)
AST: 18 U/L (ref 15–41)
Albumin: 3.2 g/dL — ABNORMAL LOW (ref 3.5–5.0)
Alkaline Phosphatase: 99 U/L (ref 38–126)
Anion gap: 6 (ref 5–15)
BUN: 11 mg/dL (ref 6–20)
CO2: 28 mmol/L (ref 22–32)
Calcium: 8.8 mg/dL — ABNORMAL LOW (ref 8.9–10.3)
Chloride: 101 mmol/L (ref 98–111)
Creatinine, Ser: 0.63 mg/dL (ref 0.44–1.00)
GFR, Estimated: >60 mL/min (ref >60)
Glucose, Bld: 290 mg/dL — ABNORMAL HIGH (ref 70–99)
Potassium: 4.2 mmol/L (ref 3.5–5.1)
Sodium: 135 mmol/L (ref 135–145)
Total Bilirubin: 0.5 mg/dL (ref 0.3–1.2)
Total Protein: 7 g/dL (ref 6.5–8.1)

## 2021-10-25 LAB — CBC
HCT: 40.9 % (ref 36.0–46.0)
Hemoglobin: 13.2 g/dL (ref 12.0–15.0)
MCH: 27.5 pg (ref 26.0–34.0)
MCHC: 32.3 g/dL (ref 30.0–36.0)
MCV: 85.2 fL (ref 80.0–100.0)
Platelets: 359 10*3/uL (ref 150–400)
RBC: 4.8 MIL/uL (ref 3.87–5.11)
RDW: 13.7 % (ref 11.5–15.5)
WBC: 9.9 10*3/uL (ref 4.0–10.5)
nRBC: 0 % (ref 0.0–0.2)

## 2021-10-25 LAB — HCG, SERUM, QUALITATIVE: Preg, Serum: NEGATIVE

## 2021-10-25 LAB — TROPONIN I (HIGH SENSITIVITY)
Troponin I (High Sensitivity): 5 ng/L (ref <18)
Troponin I (High Sensitivity): 5 ng/L (ref <18)

## 2021-10-25 LAB — LIPASE, BLOOD: Lipase: 44 U/L (ref 11–51)

## 2021-10-25 MED ORDER — ONDANSETRON HCL 4 MG/2ML IJ SOLN
4.0000 mg | Freq: Once | INTRAMUSCULAR | Status: AC
  Administered 2021-10-25: 4 mg via INTRAVENOUS
  Filled 2021-10-25: qty 2

## 2021-10-25 MED ORDER — ALUM & MAG HYDROXIDE-SIMETH 200-200-20 MG/5ML PO SUSP
30.0000 mL | Freq: Once | ORAL | Status: AC
  Administered 2021-10-25: 30 mL via ORAL
  Filled 2021-10-25: qty 30

## 2021-10-25 MED ORDER — FAMOTIDINE IN NACL 20-0.9 MG/50ML-% IV SOLN
20.0000 mg | Freq: Once | INTRAVENOUS | Status: AC
  Administered 2021-10-25: 20 mg via INTRAVENOUS
  Filled 2021-10-25: qty 50

## 2021-10-25 MED ORDER — SUCRALFATE 1 G PO TABS: 1.0000 g | ORAL_TABLET | Freq: Three times a day (TID) | ORAL | 0 refills | Status: AC

## 2021-10-25 MED ORDER — LIDOCAINE VISCOUS HCL 2 % MT SOLN
15.0000 mL | Freq: Once | OROMUCOSAL | Status: AC
  Administered 2021-10-25: 15 mL via ORAL
  Filled 2021-10-25: qty 15

## 2021-10-25 MED ORDER — KETOROLAC TROMETHAMINE 15 MG/ML IJ SOLN
15.0000 mg | Freq: Once | INTRAMUSCULAR | Status: AC
  Administered 2021-10-25: 15 mg via INTRAVENOUS
  Filled 2021-10-25: qty 1

## 2021-10-25 NOTE — Discharge Instructions (Addendum)
Return to the Emergency Department if you have unusual chest pain, pressure, or discomfort, shortness of breath, nausea, vomiting, burping, heartburn, tingling upper body parts, sweating, cold, clammy skin, or racing heartbeat. Call 911 if you think you are having a heart attack. Take all medications as prescribed - notify your doctor if you have any side effects. Follow cardiac diet - avoid fatty & fried foods, don't eat too much red meat, eat lots of fruits & vegetables, and dairy products should be low fat. Please lose weight if you are overweight. Become more active with walking, gardening, or any other activity that gets you to moving.   Please return to the emergency department immediately for any new or concerning symptoms, or if you get worse.

## 2021-10-25 NOTE — ED Provider Notes (Signed)
[facility]ERGENC[facility] DEPARTMENT Provider Note   CSN: [id number] Arrival date & time: 10/25/21  0830     History  Chief Complaint  Patient presents with   Chest Pain    [name] is a 51 y.o. female.  Patient as above with significant medical history as below, including diabetes, chronic lower extremity edema, acid reflux who presents to the ED with complaint of chest pain.  Patient reports around 7 to 8 AM this morning she began to experience right-sided chest pain described as sharp, stabbing while standing in line at [facility] [address].  Not associated with dyspnea, diaphoresis.  She does have some mild nausea no vomiting.  No change in bowel or bladder function.  No tobacco use.  No recent alcohol use.  Pain has been unchanged since the onset.  No medications prior to arrival.  No palpitations.  No trauma     Past Medical History:  Diagnosis Date   Cellulitis 07/2011   Diabetes mellitus    dx 2016   GERD (gastroesophageal reflux disease)     Past Surgical History:  Procedure Laterality Date   ABLATION     CARPAL TUNNEL RELEASE Left 09/13/2016   Procedure: LEFT CARPAL TUNNEL RELEASE;  Surgeon: Daryll Brod, MD;  Location: [facility];  Service: Orthopedics;  Laterality: Left;   CHOLECYSTECTOMY     KNEE ARTHROSCOPY     LAPAROSCOPIC TUBAL LIGATION  04/29/2011   Procedure: LAPAROSCOPIC TUBAL LIGATION;  Surgeon: Luz Lex, MD;  Location: [facility];  Service: Gynecology;  Laterality: Bilateral;  with Filshie Clips   ROTATOR CUFF REPAIR     TUBAL LIGATION       The history is provided by the patient and the spouse. No language interpreter was used.  Chest Pain Associated symptoms: nausea   Associated symptoms: no abdominal pain, no cough, no dysphagia, no fever, no headache, no palpitations, no shortness of breath and no vomiting        Home Medications Prior to Admission medications   Medication Sig Start Date End Date Taking? Authorizing Provider  sucralfate  (CARAFATE) 1 g tablet Take 1 tablet (1 g total) by mouth 4 (four) times daily -  with meals and at bedtime for 7 days. 10/25/21 11/01/21 Yes Jeanell Sparrow, DO  acetaminophen (TYLENOL) 500 MG tablet Take 1 tablet (500 mg total) by mouth every 6 (six) hours as needed. 09/30/21   Varney Biles, MD  amoxicillin-clavulanate (AUGMENTIN) 875-125 MG tablet Take 1 tablet by mouth every 12 (twelve) hours. 09/30/21   Varney Biles, MD  Blood Glucose Monitoring Suppl (TRUE METRIX METER) w/Device KIT Use as directed 11/10/20   Camillia Herter, NP  cyclobenzaprine (FLEXERIL) 10 MG tablet Take 1 tablet (10 mg total) by mouth 2 (two) times daily as needed for muscle spasms. 09/30/21   Varney Biles, MD  gabapentin (NEURONTIN) 100 MG capsule Take 1 capsule (100 mg total) by mouth at bedtime. 01/15/21 02/14/21  Camillia Herter, NP  glimepiride (AMARYL) 1 MG tablet TAKE 1 TABLET(1 MG) BY MOUTH TWICE DAILY WITH A MEAL 09/07/21   Camillia Herter, NP  glucose blood (TRUE METRIX BLOOD GLUCOSE TEST) test strip Use as instructed 11/10/20   Camillia Herter, NP  Insulin Pen Needle (PEN NEEDLES) 31G X 8 MM MISC UAD 03/01/21   Camillia Herter, NP  LANTUS SOLOSTAR 100 UNIT/ML Solostar Pen ADMINISTER 13 UNITS UNDER THE SKIN AT BEDTIME 07/06/21   Camillia Herter, NP  lidocaine (LIDODERM) 5 %  Place 1 patch onto the skin daily. Remove & Discard patch within 12 hours or as directed by MD 12/30/20   Loni Beckwith, PA-C  metFORMIN (GLUCOPHAGE-XR) 500 MG 24 hr tablet TAKE 2 TABLETS(1000 MG) BY MOUTH TWICE DAILY WITH A MEAL 09/07/21   Camillia Herter, NP  omeprazole (PRILOSEC) 40 MG capsule Take 40 mg by mouth daily.    [provider]  ondansetron (ZOFRAN-ODT) 8 MG disintegrating tablet Take 1 tablet (8 mg total) by mouth every 8 (eight) hours as needed for nausea or vomiting. 04/02/21   Molpus, Jenny Reichmann, MD  TRUEplus Lancets 28G MISC Use as directed 11/10/20   Camillia Herter, NP  pantoprazole (PROTONIX) 20 MG tablet Take 1 tablet  (20 mg total) by mouth daily. 05/26/17 06/27/20  Malvin Johns, MD      Allergies    Diclofenac, Vancomycin, and Adhesive [tape]    Review of Systems   Review of Systems  Constitutional:  Negative for chills and fever.  HENT:  Negative for facial swelling and trouble swallowing.   Eyes:  Negative for photophobia and visual disturbance.  Respiratory:  Negative for cough and shortness of breath.   Cardiovascular:  Positive for chest pain. Negative for palpitations.  Gastrointestinal:  Positive for nausea. Negative for abdominal pain and vomiting.  Endocrine: Negative for polydipsia and polyuria.  Genitourinary:  Negative for difficulty urinating and hematuria.  Musculoskeletal:  Negative for gait problem and joint swelling.  Skin:  Negative for pallor and rash.  Neurological:  Negative for syncope and headaches.  Psychiatric/Behavioral:  Negative for agitation and confusion.     Physical Exam Updated Vital Signs BP 126/83   Pulse 86   Resp 20   Ht 5' 1"  (1.549 m)   Wt 63 kg   SpO2 95%   BMI 26.23 kg/m  Physical Exam Vitals and nursing note reviewed.  Constitutional:      General: She is not in acute distress.    Appearance: Normal appearance. She is well-developed. She is obese. She is not ill-appearing.  HENT:     Head: Normocephalic and atraumatic.     Right Ear: External ear normal.     Left Ear: External ear normal.     Nose: Nose normal.     Mouth/Throat:     Mouth: Mucous membranes are moist.  Eyes:     General: No scleral icterus.       Right eye: No discharge.        Left eye: No discharge.  Cardiovascular:     Rate and Rhythm: Normal rate and regular rhythm.     Pulses: Normal pulses.     Heart sounds: Normal heart sounds.     No systolic murmur is present.     No diastolic murmur is present.     No S3 or S4 sounds.  Pulmonary:     Effort: Pulmonary effort is normal. No tachypnea, accessory muscle usage or respiratory distress.     Breath sounds: Normal  breath sounds. No wheezing.  Abdominal:     General: Abdomen is flat.     Palpations: Abdomen is soft.     Tenderness: There is no abdominal tenderness. There is no guarding or rebound.  Musculoskeletal:        General: Normal range of motion.     Cervical back: Normal range of motion.     Right lower leg: Edema present.     Left lower leg: Edema present.  Skin:  General: Skin is warm and dry.     Capillary Refill: Capillary refill takes less than 2 seconds.  Neurological:     Mental Status: She is alert and oriented to person, place, and time.     GCS: GCS eye subscore is 4. GCS verbal subscore is 5. GCS motor subscore is 6.  Psychiatric:        Mood and Affect: Mood normal.        Behavior: Behavior normal.     ED Results / Procedures / Treatments   Labs (all labs ordered are listed, but only abnormal results are displayed) Labs Reviewed  COMPREHENSIVE METABOLIC PANEL - Abnormal; Notable for the following components:      Result Value   Glucose, Bld 290 (*)    Calcium 8.8 (*)    Albumin 3.2 (*)    All other components within normal limits  CBC  LIPASE, BLOOD  HCG, SERUM, QUALITATIVE  TROPONIN I (HIGH SENSITIVITY)  TROPONIN I (HIGH SENSITIVITY)    EKG EKG Interpretation  Date/Time:  Monday October 25 2021 08:41:24 EDT Ventricular Rate:  96 PR Interval:  196 QRS Duration: 76 QT Interval:  358 QTC Calculation: 453 R Axis:   17 Text Interpretation: Sinus rhythm Low voltage, precordial leads similar to prior, no stemi Confirmed by Wynona Dove ([id number]) on 10/25/2021 12:27:22 PM  Radiology DG Chest 2 View  Result Date: 10/25/2021 CLINICAL DATA:  Chest pain EXAM: CHEST - 2 VIEW COMPARISON:  Radiograph 08/03/2021 FINDINGS: Cardiomediastinal silhouette is within normal limits. There is no focal airspace disease. There is no pleural effusion. No pneumothorax. There is no acute osseous abnormality. Mild degenerative changes of the spine. Left shoulder calcific tendinosis.  IMPRESSION: No evidence of acute cardiopulmonary disease. Electronically Signed   By: Maurine Simmering M.D.   On: 10/25/2021 09:09    Procedures Procedures    Medications Ordered in ED Medications  ondansetron (ZOFRAN) injection 4 mg (4 mg Intravenous Given 10/25/21 1000)  famotidine (PEPCID) IVPB 20 mg premix (0 mg Intravenous Stopped 10/25/21 1034)  alum & mag hydroxide-simeth (MAALOX/MYLANTA) 200-200-20 MG/5ML suspension 30 mL (30 mLs Oral Given 10/25/21 0959)    And  lidocaine (XYLOCAINE) 2 % viscous mouth solution 15 mL (15 mLs Oral Given 10/25/21 0959)  ketorolac (TORADOL) 15 MG/ML injection 15 mg (15 mg Intravenous Given 10/25/21 1251)    ED Course/ Medical Decision Making/ A&P                           Medical Decision Making Amount and/or Complexity of Data Reviewed Labs: ordered. Radiology: ordered.  Risk OTC drugs. Prescription drug management.    CC: cp  This patient presents to the Emergency Department for the above complaint. This involves an extensive number of treatment options and is a complaint that carries with it a high risk of complications and morbidity. Vital signs were reviewed. Serious etiologies considered.  Differential includes all life-threatening causes for chest pain. This includes but is not exclusive to acute coronary syndrome, aortic dissection, pulmonary embolism, cardiac tamponade, community-acquired pneumonia, pericarditis, musculoskeletal chest wall pain, etc.]  Record review:  Previous records obtained and reviewed prior ED visits, prior labs and imaging  Additional history obtained from spouse at bedside  Medical and surgical history as noted above.   Work up as above, notable for:  Labs & imaging results that were available during my care of the patient were visualized by me and considered in my medical  decision making.  Physical exam as above.   I ordered imaging studies which included chest x-ray. I visualized the imaging, interpreted  images, and I agree with radiologist interpretation.  Chest x-ray, no acute process  Cardiac monitoring reviewed and interpreted personally which shows NSR  Labs reviewed and are stable.  Troponin negative x2, chest pain has resolved, ECG without acute ischemic changes, chest x-ray negative, low heart score.   Management: GI cocktail  ED Course:     Reassessment:  Symptoms improved Tolerant p.o. intake with difficulty.  She is breathing comfortably on ambient air.    Admission was considered.    The patient's chest pain is not suggestive of pulmonary embolus, cardiac ischemia, aortic dissection, pericarditis, myocarditis, pulmonary embolism, pneumothorax, pneumonia, Zoster, or esophageal perforation, or other serious etiology.  Historically not abrupt in onset, tearing or ripping, pulses symmetric. EKG nonspecific for ischemia/infarction. No dysrhythmias, brugada, WPW, prolonged QT noted.   Troponin negative x2. CXR reviewed. Labs without demonstration of acute pathology unless otherwise noted above. Low HEART Score: 0-3 points (0.9-1.7% risk of MACE).   Given the extremely low risk of these diagnoses further testing and evaluation for these possibilities does not appear to be indicated at this time. Patient in no distress and overall condition improved here in the ED. Detailed discussions were had with the patient regarding current findings, and need for close f/u with PCP or on call doctor. The patient has been instructed to return immediately if the symptoms worsen in any way for re-evaluation. Patient verbalized understanding and is in agreement with current care plan. All questions answered prior to discharge.            Social determinants of health include -  Social History   Socioeconomic History   Marital status: Married    Spouse name: Not on file   Number of children: Not on file   Years of education: Not on file   Highest education level: Not on file   Occupational History   Not on file  Tobacco Use   Smoking status: Former    Packs/day: 0.50    Years: 2.00    Total pack years: 1.00    Types: Cigarettes    Quit date: 07/22/2011    Years since quitting: 10.2    Passive exposure: Past   Smokeless tobacco: Never  Vaping Use   Vaping Use: Never used  Substance and Sexual Activity   Alcohol use: Yes    Alcohol/week: 0.0 standard drinks of alcohol    Comment: occ   Drug use: No   Sexual activity: Not on file  Other Topics Concern   Not on file  Social History Narrative   Not on file   Social Determinants of Health   Financial Resource Strain: Not on file  Food Insecurity: Not on file  Transportation Needs: Not on file  Physical Activity: Not on file  Stress: Not on file  Social Connections: Not on file  Intimate Partner Violence: Not on file      This chart was dictated using voice recognition software.  Despite best efforts to proofread,  errors can occur which can change the documentation meaning.         Final Clinical Impression(s) / ED Diagnoses Final diagnoses:  Atypical chest pain    Rx / DC Orders ED Discharge Orders          Ordered    sucralfate (CARAFATE) 1 g tablet  3 times daily with meals & bedtime  10/25/21 1419              Wynona Dove A, DO 10/25/21 1423

## 2021-10-25 NOTE — ED Triage Notes (Signed)
Right sided chest pain started prior to arrival, radiating to right jaw.  Pt denies sob.  Some nausea, no vomiting.

## 2021-10-25 NOTE — ED Notes (Signed)
Pt reports resolution of nausea, continues to report chest pain unchanged since arrival

## 2021-10-25 NOTE — ED Notes (Signed)
Patient transported to X-ray 

## 2021-10-25 NOTE — ED Notes (Signed)
Attempt to obtain PIV unsuccessful x2 charge RN to attempt

## 2021-11-16 ENCOUNTER — Ambulatory Visit (HOSPITAL_COMMUNITY)
Admission: EM | Admit: 2021-11-16 | Discharge: 2021-11-16 | Disposition: A | Discharge status: Home / Self Care | Payer: Worker's Compensation | Attending: Family Medicine | Admitting: Family Medicine

## 2021-11-16 ENCOUNTER — Encounter (HOSPITAL_COMMUNITY): Payer: Self-pay | Admitting: Emergency Medicine

## 2021-11-16 ENCOUNTER — Ambulatory Visit (INDEPENDENT_AMBULATORY_CARE_PROVIDER_SITE_OTHER): Payer: Worker's Compensation

## 2021-11-16 DIAGNOSIS — M79631 Pain in right forearm: Secondary | ICD-10-CM

## 2021-11-16 MED ORDER — IBUPROFEN 600 MG PO TABS: 600.0000 mg | ORAL_TABLET | Freq: Three times a day (TID) | ORAL | 0 refills | Status: DC | PRN

## 2021-11-16 MED ORDER — KETOROLAC TROMETHAMINE 30 MG/ML IJ SOLN
30.0000 mg | Freq: Once | INTRAMUSCULAR | Status: AC
  Administered 2021-11-16: 30 mg via INTRAMUSCULAR

## 2021-11-16 MED ORDER — KETOROLAC TROMETHAMINE 30 MG/ML IJ SOLN
INTRAMUSCULAR | Status: AC
  Filled 2021-11-16: qty 1

## 2021-11-16 NOTE — ED Provider Notes (Signed)
[facility]    CSN: [id number] Arrival date & time: 11/16/21  1651      History   Chief Complaint Chief Complaint  Patient presents with   Arm Pain    HPI Brooke Lecy is a 51 Peterson.o. female.    Arm Pain   Here for pain in her right forearm.  Earlier today when she was picking up a box she felt a pop in her right forearm and had to drop the box.  Now it hurts when she supinates her right forearm.  No trauma or fall.   She does have a history of diabetes and takes Lantus and metformin.  She has not checked her sugar in a while  Past Medical History:  Diagnosis Date   Cellulitis 07/2011   Diabetes mellitus    dx 2016   GERD (gastroesophageal reflux disease)     Patient Active Problem List   Diagnosis Date Noted   Left lumbar radiculopathy 02/22/2019   Trigger finger of left thumb 01/07/2019   Cellulitis and abscess of right leg 01/02/2018   Bilateral carpal tunnel syndrome 07/06/2016   Left elbow pain 02/17/2016   Uncontrolled type 2 diabetes mellitus with hyperglycemia ([place]) 10/29/2015   Uncontrolled type 2 diabetes mellitus without complication, with long-term current use of insulin 10/29/2015   Injury of right hand 05/19/2015   Cervical disc disorder with radiculopathy, unspecified cervical region 10/10/2013   Diabetes ([place]) 04/07/2013   Pyelonephritis 04/06/2013   RLQ abdominal pain 04/06/2013   Fever 09/07/2012   Headache(784.0) 09/07/2012   Cellulitis 08/05/2011   Peripheral edema 08/05/2011   Gastroesophageal reflux disease 08/05/2011   BMI 50.0-59.9, adult ([place]) 08/05/2011    Past Surgical History:  Procedure Laterality Date   ABLATION     CARPAL TUNNEL RELEASE Left 09/13/2016   Procedure: LEFT CARPAL TUNNEL RELEASE;  Surgeon: Daryll Brod, MD;  Location: [facility];  Service: Orthopedics;  Laterality: Left;   CHOLECYSTECTOMY     KNEE ARTHROSCOPY     LAPAROSCOPIC TUBAL LIGATION  04/29/2011   Procedure: LAPAROSCOPIC TUBAL LIGATION;  Surgeon:  Luz Lex, MD;  Location: [facility];  Service: Gynecology;  Laterality: Bilateral;  with Filshie Clips   ROTATOR CUFF REPAIR     TUBAL LIGATION      OB History   No obstetric history on file.      Home Medications    Prior to Admission medications   Medication Sig Start Date End Date Taking? Authorizing Provider  ibuprofen (ADVIL) 600 MG tablet Take 1 tablet (600 mg total) by mouth every 8 (eight) hours as needed (pain). 11/16/21  Yes Barrett Henle, MD  acetaminophen (TYLENOL) 500 MG tablet Take 1 tablet (500 mg total) by mouth every 6 (six) hours as needed. 09/30/21   Varney Biles, MD  Blood Glucose Monitoring Suppl (TRUE METRIX METER) w/Device KIT Use as directed 11/10/20   Camillia Herter, NP  gabapentin (NEURONTIN) 100 MG capsule Take 1 capsule (100 mg total) by mouth at bedtime. 01/15/21 02/14/21  Camillia Herter, NP  glimepiride (AMARYL) 1 MG tablet TAKE 1 TABLET(1 MG) BY MOUTH TWICE DAILY WITH A MEAL 09/07/21   Camillia Herter, NP  glucose blood (TRUE METRIX BLOOD GLUCOSE TEST) test strip Use as instructed 11/10/20   Camillia Herter, NP  Insulin Pen Needle (PEN NEEDLES) 31G X 8 MM MISC UAD 03/01/21   Camillia Herter, NP  LANTUS SOLOSTAR 100 UNIT/ML Solostar Pen ADMINISTER 13 UNITS UNDER THE SKIN  AT BEDTIME 07/06/21   Camillia Herter, NP  lidocaine (LIDODERM) 5 % Place 1 patch onto the skin daily. Remove & Discard patch within 12 hours or as directed by MD 12/30/20   Loni Beckwith, PA-C  metFORMIN (GLUCOPHAGE-XR) 500 MG 24 hr tablet TAKE 2 TABLETS(1000 MG) BY MOUTH TWICE DAILY WITH A MEAL 09/07/21   Camillia Herter, NP  omeprazole (PRILOSEC) 40 MG capsule Take 40 mg by mouth daily.    [provider]  sucralfate (CARAFATE) 1 g tablet Take 1 tablet (1 g total) by mouth 4 (four) times daily -  with meals and at bedtime for 7 days. 10/25/21 11/01/21  Jeanell Sparrow, DO  TRUEplus Lancets 28G MISC Use as directed 11/10/20   Camillia Herter, NP  pantoprazole (PROTONIX) 20 MG  tablet Take 1 tablet (20 mg total) by mouth daily. 05/26/17 06/27/20  Malvin Johns, MD    Family History Family History  Problem Relation Age of Onset   Cancer Mother    Diabetes Mother    Hypertension Mother     Social History Social History   Tobacco Use   Smoking status: Former    Packs/day: 0.50    Years: 2.00    Total pack years: 1.00    Types: Cigarettes    Quit date: 07/22/2011    Years since quitting: 10.3    Passive exposure: Past   Smokeless tobacco: Never  Vaping Use   Vaping Use: Never used  Substance Use Topics   Alcohol use: Yes    Alcohol/week: 0.0 standard drinks of alcohol    Comment: occ   Drug use: No     Allergies   Diclofenac, Vancomycin, and Adhesive [tape]   Review of Systems Review of Systems   Physical Exam Triage Vital Signs ED Triage Vitals  Enc Vitals Group     BP 11/16/21 1726 119/65     Pulse Rate 11/16/21 1726 90     Resp 11/16/21 1726 20     Temp 11/16/21 1726 98.3 F (36.8 C)     Temp Source 11/16/21 1726 Oral     SpO2 11/16/21 1726 97 %     Weight --      Height --      Head Circumference --      Peak Flow --      Pain Score 11/16/21 1724 10     Pain Loc --      Pain Edu? --      Excl. in [place]? --    No data found.  Updated Vital Signs BP 119/65 (BP Location: Left Arm)   Pulse 90   Temp 98.3 F (36.8 C) (Oral)   Resp 20   SpO2 97%   Visual Acuity Right Eye Distance:   Left Eye Distance:   Bilateral Distance:    Right Eye Near:   Left Eye Near:    Bilateral Near:     Physical Exam Vitals reviewed.  Constitutional:      General: She is not in acute distress.    Appearance: She is not ill-appearing, toxic-appearing or diaphoretic.  Musculoskeletal:     Comments: There is no erythema or deformity noted of the right forearm.  It is tender along her radius in the midshaft.  She is able to flex and extend her fingers and flex and extend at the wrist.  Skin:    Coloration: Skin is not pale.  Neurological:      Mental Status: She  is oriented to person, place, and time.  Psychiatric:        Behavior: Behavior normal.      UC Treatments / Results  Labs (all labs ordered are listed, but only abnormal results are displayed) Labs Reviewed - No data to display  EKG   Radiology DG Forearm Right  Result Date: 11/16/2021 CLINICAL DATA:  Right forearm pain EXAM: RIGHT FOREARM - 2 VIEW COMPARISON:  None Available. FINDINGS: There is no evidence of fracture or other focal bone lesions. Soft tissues are unremarkable. IMPRESSION: Negative. Electronically Signed   By: Ronney Asters M.D.   On: 11/16/2021 17:56    Procedures Procedures (including critical care time)  Medications Ordered in UC Medications  ketorolac (TORADOL) 30 MG/ML injection 30 mg (has no administration in time range)    Initial Impression / Assessment and Plan / UC Course  I have reviewed the triage vital signs and the nursing notes.  Pertinent labs & imaging results that were available during my care of the patient were reviewed by me and considered in my medical decision making (see chart for details).     X-ray does not show any fracture.  We will try wrist brace to see if that helps her any.  No function is normal when done last month, so we will give her Toradol and ibuprofen.  Ice and elevate.  And she should see the occupational health clinic tomorrow  Her chart says that she is allergic to diclofenac, that it causes "red man" syndrome.  Of note she has had multiple doses of Toradol in epic in the past the most recent in July.  She notes only allergy to vancomycin and to no other medicines in her history Final Clinical Impressions(s) / UC Diagnoses   Final diagnoses:  Right forearm pain     Discharge Instructions      Your x-ray did not show any broken bones.  Ice and elevate that arm.  You can try wearing the wrist brace to see if it helps  You have been given a shot of Toradol 30 mg today.  Take  ibuprofen 800 mg--1 tab every 8 hours as needed for pain.       ED Prescriptions     Medication Sig Dispense Auth. Provider   ibuprofen (ADVIL) 600 MG tablet Take 1 tablet (600 mg total) by mouth every 8 (eight) hours as needed (pain). 15 tablet Izetta Sakamoto, Gwenlyn Perking, MD      PDMP not reviewed this encounter.   Barrett Henle, MD 11/16/21 [phone]

## 2021-11-16 NOTE — ED Triage Notes (Signed)
Pt at work today when picking up box off floor felt pop in right forearm. Having pains since esp with movement. Has some swelling as well per patient.

## 2021-11-16 NOTE — Discharge Instructions (Addendum)
Your x-ray did not show any broken bones.  Ice and elevate that arm.  You can try wearing the wrist brace to see if it helps  You have been given a shot of Toradol 30 mg today.  Take ibuprofen 800 mg--1 tab every 8 hours as needed for pain.

## 2021-12-07 ENCOUNTER — Telehealth: Payer: Self-pay | Admitting: *Deleted

## 2021-12-07 NOTE — Telephone Encounter (Signed)
error 

## 2022-01-17 ENCOUNTER — Other Ambulatory Visit (HOSPITAL_COMMUNITY): Payer: Self-pay | Admitting: Family Medicine

## 2022-01-17 ENCOUNTER — Ambulatory Visit (HOSPITAL_COMMUNITY)
Admission: RE | Admit: 2022-01-17 | Discharge: 2022-01-17 | Disposition: A | Discharge status: Home / Self Care | Payer: 59 | Source: Ambulatory Visit | Attending: Family Medicine | Admitting: Family Medicine

## 2022-01-17 DIAGNOSIS — M7989 Other specified soft tissue disorders: Secondary | ICD-10-CM | POA: Insufficient documentation

## 2022-01-17 DIAGNOSIS — M79601 Pain in right arm: Secondary | ICD-10-CM | POA: Diagnosis not present

## 2022-01-17 NOTE — Progress Notes (Signed)
Upper extremity venous has been completed.   Preliminary results in CV Proc.   Jinny Blossom Cherye Gaertner 01/17/2022 2:43 PM

## 2022-01-18 ENCOUNTER — Encounter (HOSPITAL_BASED_OUTPATIENT_CLINIC_OR_DEPARTMENT_OTHER): Payer: Self-pay | Admitting: Emergency Medicine

## 2022-01-18 ENCOUNTER — Other Ambulatory Visit: Payer: Self-pay

## 2022-01-18 ENCOUNTER — Emergency Department (HOSPITAL_BASED_OUTPATIENT_CLINIC_OR_DEPARTMENT_OTHER): Payer: Commercial Managed Care - HMO

## 2022-01-18 ENCOUNTER — Emergency Department (HOSPITAL_BASED_OUTPATIENT_CLINIC_OR_DEPARTMENT_OTHER)
Admission: EM | Admit: 2022-01-18 | Discharge: 2022-01-18 | Disposition: A | Discharge status: Home / Self Care | Payer: Commercial Managed Care - HMO | Attending: Emergency Medicine | Admitting: Emergency Medicine

## 2022-01-18 DIAGNOSIS — L03115 Cellulitis of right lower limb: Secondary | ICD-10-CM | POA: Diagnosis not present

## 2022-01-18 DIAGNOSIS — E119 Type 2 diabetes mellitus without complications: Secondary | ICD-10-CM | POA: Insufficient documentation

## 2022-01-18 DIAGNOSIS — Z20822 Contact with and (suspected) exposure to covid-19: Secondary | ICD-10-CM | POA: Diagnosis not present

## 2022-01-18 DIAGNOSIS — M7989 Other specified soft tissue disorders: Secondary | ICD-10-CM | POA: Diagnosis present

## 2022-01-18 LAB — COMPREHENSIVE METABOLIC PANEL
ALT: 20 U/L (ref 0–44)
AST: 20 U/L (ref 15–41)
Albumin: 3.3 g/dL — ABNORMAL LOW (ref 3.5–5.0)
Alkaline Phosphatase: 124 U/L (ref 38–126)
Anion gap: 9 (ref 5–15)
BUN: 10 mg/dL (ref 6–20)
CO2: 27 mmol/L (ref 22–32)
Calcium: 8.8 mg/dL — ABNORMAL LOW (ref 8.9–10.3)
Chloride: 97 mmol/L — ABNORMAL LOW (ref 98–111)
Creatinine, Ser: 0.62 mg/dL (ref 0.44–1.00)
GFR, Estimated: >60 mL/min (ref >60)
Glucose, Bld: 329 mg/dL — ABNORMAL HIGH (ref 70–99)
Potassium: 3.9 mmol/L (ref 3.5–5.1)
Sodium: 133 mmol/L — ABNORMAL LOW (ref 135–145)
Total Bilirubin: 0.6 mg/dL (ref 0.3–1.2)
Total Protein: 7.2 g/dL (ref 6.5–8.1)

## 2022-01-18 LAB — CBC WITH DIFFERENTIAL/PLATELET
Abs Immature Granulocytes: 0.04 10*3/uL (ref 0.00–0.07)
Basophils Absolute: 0.1 10*3/uL (ref 0.0–0.1)
Basophils Relative: 1 %
Eosinophils Absolute: 0.3 10*3/uL (ref 0.0–0.5)
Eosinophils Relative: 3 %
HCT: 41.4 % (ref 36.0–46.0)
Hemoglobin: 13.6 g/dL (ref 12.0–15.0)
Immature Granulocytes: 0 %
Lymphocytes Relative: 27 %
Lymphs Abs: 2.9 10*3/uL (ref 0.7–4.0)
MCH: 27.5 pg (ref 26.0–34.0)
MCHC: 32.9 g/dL (ref 30.0–36.0)
MCV: 83.6 fL (ref 80.0–100.0)
Monocytes Absolute: 0.8 10*3/uL (ref 0.1–1.0)
Monocytes Relative: 8 %
Neutro Abs: 6.7 10*3/uL (ref 1.7–7.7)
Neutrophils Relative %: 61 %
Platelets: 344 10*3/uL (ref 150–400)
RBC: 4.95 MIL/uL (ref 3.87–5.11)
RDW: 13.6 % (ref 11.5–15.5)
WBC: 10.8 10*3/uL — ABNORMAL HIGH (ref 4.0–10.5)
nRBC: 0 % (ref 0.0–0.2)

## 2022-01-18 LAB — SARS CORONAVIRUS 2 BY RT PCR: SARS Coronavirus 2 by RT PCR: NEGATIVE

## 2022-01-18 LAB — LACTIC ACID, PLASMA: Lactic Acid, Venous: 1.5 mmol/L (ref 0.5–1.9)

## 2022-01-18 MED ORDER — LACTATED RINGERS IV BOLUS
500.0000 mL | Freq: Once | INTRAVENOUS | Status: AC
  Administered 2022-01-18: 500 mL via INTRAVENOUS

## 2022-01-18 MED ORDER — CLINDAMYCIN HCL 150 MG PO CAPS: 150.0000 mg | ORAL_CAPSULE | Freq: Four times a day (QID) | ORAL | 0 refills | Status: DC

## 2022-01-18 MED ORDER — CLINDAMYCIN HCL 150 MG PO CAPS
300.0000 mg | ORAL_CAPSULE | Freq: Once | ORAL | Status: AC
  Administered 2022-01-18: 300 mg via ORAL
  Filled 2022-01-18: qty 2

## 2022-01-18 NOTE — ED Notes (Addendum)
Patient presents with bilateral lower leg swelling. States it has been weeping all morning. Patient states she has never been this swollen and her legs have never weeped? fluids before. Patients legs are red with erythema.

## 2022-01-18 NOTE — ED Triage Notes (Signed)
Right lower leg swelling with drainage, headache, chills, and nausea since yesterday. Hx of cellulitis in same area. Reports feels the same.

## 2022-01-18 NOTE — ED Provider Notes (Signed)
[facility] EMERGENCY DEPARTMENT Provider Note   CSN: [id number] Arrival date & time: 01/18/22  1731     History  Chief Complaint  Patient presents with   Leg Swelling    [name] is a 51 y.o. female.  HPI     51yo female with history of DM, GERD, recurrent cellulitis (no recent infections) who presents with RLE redness, chills, nausea, swelling.  Reports symptoms began yesterday. Pain to RLE, feels like prior cellulitis.  Has had swelling and some clear fluid related to the swelling as well. Pain to lower leg including back of calf.   Has had associated nausea, chills.  No known fevers. No chest pain, dyspnea, recent surgeries. Is being seen by PT for shoulder pain, temp there was 99 today.   Past Medical History:  Diagnosis Date   Cellulitis 07/2011   Diabetes mellitus    dx 2016   GERD (gastroesophageal reflux disease)      Home Medications Prior to Admission medications   Medication Sig Start Date End Date Taking? Authorizing Provider  acetaminophen (TYLENOL) 500 MG tablet Take 1 tablet (500 mg total) by mouth every 6 (six) hours as needed. 09/30/21   Varney Biles, MD  Blood Glucose Monitoring Suppl (TRUE METRIX METER) w/Device KIT Use as directed 11/10/20   Camillia Herter, NP  gabapentin (NEURONTIN) 100 MG capsule Take 1 capsule (100 mg total) by mouth at bedtime. 01/15/21 02/14/21  Camillia Herter, NP  glimepiride (AMARYL) 1 MG tablet TAKE 1 TABLET(1 MG) BY MOUTH TWICE DAILY WITH A MEAL 09/07/21   Camillia Herter, NP  glucose blood (TRUE METRIX BLOOD GLUCOSE TEST) test strip Use as instructed 11/10/20   Camillia Herter, NP  ibuprofen (ADVIL) 600 MG tablet Take 1 tablet (600 mg total) by mouth every 8 (eight) hours as needed (pain). 11/16/21   Barrett Henle, MD  Insulin Pen Needle (PEN NEEDLES) 31G X 8 MM MISC UAD 03/01/21   Camillia Herter, NP  LANTUS SOLOSTAR 100 UNIT/ML Solostar Pen ADMINISTER 13 UNITS UNDER THE SKIN AT BEDTIME 07/06/21    Camillia Herter, NP  lidocaine (LIDODERM) 5 % Place 1 patch onto the skin daily. Remove & Discard patch within 12 hours or as directed by MD 12/30/20   Loni Beckwith, PA-C  metFORMIN (GLUCOPHAGE-XR) 500 MG 24 hr tablet TAKE 2 TABLETS(1000 MG) BY MOUTH TWICE DAILY WITH A MEAL 09/07/21   Camillia Herter, NP  omeprazole (PRILOSEC) 40 MG capsule Take 40 mg by mouth daily.    [provider]  sucralfate (CARAFATE) 1 g tablet Take 1 tablet (1 g total) by mouth 4 (four) times daily -  with meals and at bedtime for 7 days. 10/25/21 11/01/21  Jeanell Sparrow, DO  TRUEplus Lancets 28G MISC Use as directed 11/10/20   Camillia Herter, NP  pantoprazole (PROTONIX) 20 MG tablet Take 1 tablet (20 mg total) by mouth daily. 05/26/17 06/27/20  Malvin Johns, MD      Allergies    Diclofenac, Vancomycin, and Adhesive [tape]    Review of Systems   Review of Systems  Physical Exam Updated Vital Signs BP (!) 142/82   Pulse 95   Temp 98 F (36.7 C) (Oral)   Resp 18   Ht _0  (1.549 m)   Wt (!) 137.9 kg   SpO2 100%   BMI 57.44 kg/m  Physical Exam Vitals and nursing note reviewed.  Constitutional:      General:  She is not in acute distress.    Appearance: She is well-developed. She is not diaphoretic.  HENT:     Head: Normocephalic and atraumatic.  Eyes:     Conjunctiva/sclera: Conjunctivae normal.  Cardiovascular:     Rate and Rhythm: Normal rate and regular rhythm.     Heart sounds: Normal heart sounds. No murmur heard.    No friction rub. No gallop.  Pulmonary:     Effort: Pulmonary effort is normal. No respiratory distress.     Breath sounds: Normal breath sounds. No wheezing or rales.  Abdominal:     General: There is no distension.     Palpations: Abdomen is soft.     Tenderness: There is no abdominal tenderness. There is no guarding.  Musculoskeletal:        General: No tenderness.     Cervical back: Normal range of motion.  Skin:    General: Skin is warm and dry.      Findings: No erythema or rash.  Neurological:     Mental Status: She is alert and oriented to person, place, and time.     ED Results / Procedures / Treatments   Labs (all labs ordered are listed, but only abnormal results are displayed) Labs Reviewed  CBC WITH DIFFERENTIAL/PLATELET - Abnormal; Notable for the following components:      Result Value   WBC 10.8 (*)    All other components within normal limits  COMPREHENSIVE METABOLIC PANEL - Abnormal; Notable for the following components:   Sodium 133 (*)    Chloride 97 (*)    Glucose, Bld 329 (*)    Calcium 8.8 (*)    Albumin 3.3 (*)    All other components within normal limits  SARS CORONAVIRUS 2 BY RT PCR  LACTIC ACID, PLASMA  LACTIC ACID, PLASMA    EKG None  Radiology VAS [place] UPPER EXTREMITY VENOUS DUPLEX  Result Date: 01/17/2022 UPPER VENOUS STUDY  Patient Name:  [name]  Date of Exam:   01/17/2022 Medical Rec #: [id number]         Accession #:    [id number] Date of Birth: Mar 03, 1971         Patient Gender: F Patient Age:   59 years Exam Location:  [facility] Procedure:      VAS [place] UPPER EXTREMITY VENOUS DUPLEX Referring Phys: [facility] --------------------------------------------------------------------------------  Indications: Pain, and Swelling Comparison Study: no prior Performing Technologist: Archie Patten RVS  Examination Guidelines: A complete evaluation includes B-mode imaging, spectral Doppler, color Doppler, and power Doppler as needed of all accessible portions of each vessel. Bilateral testing is considered an integral part of a complete examination. Limited examinations for reoccurring indications may be performed as noted.  Right Findings: +----------+------------+---------+-----------+----------+-------+ RIGHT     CompressiblePhasicitySpontaneousPropertiesSummary +----------+------------+---------+-----------+----------+-------+ IJV           Full       Yes       Yes                       +----------+------------+---------+-----------+----------+-------+ Subclavian    Full       Yes       Yes                      +----------+------------+---------+-----------+----------+-------+ Axillary      Full       Yes       Yes                      +----------+------------+---------+-----------+----------+-------+  Brachial      Full       Yes       Yes                      +----------+------------+---------+-----------+----------+-------+ Radial        Full                                          +----------+------------+---------+-----------+----------+-------+ Ulnar         Full                                          +----------+------------+---------+-----------+----------+-------+ Cephalic      Full                                          +----------+------------+---------+-----------+----------+-------+ Basilic       Full                                          +----------+------------+---------+-----------+----------+-------+  Left Findings: +----------+------------+---------+-----------+----------+-------+ LEFT      CompressiblePhasicitySpontaneousPropertiesSummary +----------+------------+---------+-----------+----------+-------+ Subclavian               Yes       Yes                      +----------+------------+---------+-----------+----------+-------+  Summary:  Right: No evidence of deep vein thrombosis in the upper extremity. No evidence of superficial vein thrombosis in the upper extremity.  Left: No evidence of thrombosis in the subclavian.  *See table(s) above for measurements and observations.  Diagnosing physician: Servando Snare MD Electronically signed by Servando Snare MD on 01/17/2022 at 7:49:23 PM.    Final     Procedures Procedures    Medications Ordered in ED Medications  lactated ringers bolus 500 mL (500 mLs Intravenous New Bag/Given 01/18/22 2215)  clindamycin (CLEOCIN) capsule 300 mg (300 mg Oral Given  01/18/22 2239)    ED Course/ Medical Decision Making/ A&P                             51yo female with history of DM, GERD, recurrent cellulitis (no recent infections) who presents with RLE redness, chills, nausea, swelling.  DDx for leg pain includes cellulitis, DVT. Do not see signs of abscess, necrotizing infection. Normal pulses bilaterally and doubt acute arterial thrombus.    COVID testing ordered in triage given nausea and chills and negative.  Labs completed and personally evaluated and interpreted by me without AKI, no lactic acidosis, mild hypokalemia and hyperglycemia without DKA. Mild leukocytosis of 10.8.  Hemodynamically stable in the ED. Suspect cellulitis but do not see significant signs of sepsis or indication for inpatient admission at this time.  DVT study completed and negative. Ordered oral clindamycin with plan to continue this treatment for suspected cellulitis as an outpatient.   Patient discharged in stable condition with understanding of reasons to return.         Final Clinical Impression(s) / ED Diagnoses Final diagnoses:  Cellulitis  of right lower extremity    Rx / DC Orders ED Discharge Orders     None         Gareth Morgan, MD 01/18/22 2337

## 2022-01-19 ENCOUNTER — Ambulatory Visit: Payer: Commercial Managed Care - HMO | Admitting: Physician Assistant

## 2022-01-19 ENCOUNTER — Ambulatory Visit: Payer: Self-pay | Admitting: *Deleted

## 2022-01-19 VITALS — BP 132/78 | Ht 61.0 in

## 2022-01-19 DIAGNOSIS — L02415 Cutaneous abscess of right lower limb: Secondary | ICD-10-CM | POA: Diagnosis not present

## 2022-01-19 DIAGNOSIS — E1165 Type 2 diabetes mellitus with hyperglycemia: Secondary | ICD-10-CM

## 2022-01-19 DIAGNOSIS — L03115 Cellulitis of right lower limb: Secondary | ICD-10-CM

## 2022-01-19 LAB — POCT GLYCOSYLATED HEMOGLOBIN (HGB A1C): Hemoglobin A1C: 12.9 % — AB (ref 4.0–5.6)

## 2022-01-19 NOTE — Telephone Encounter (Signed)
Provider aware

## 2022-01-19 NOTE — Progress Notes (Unsigned)
   Established Patient Office Visit  Subjective   Patient ID: [name], female    DOB: [birth year]  Age: 51 y.o. MRN: [id number]  Chief Complaint  Patient presents with   Fluid drainage in Leg     Started last night, was seen at the ED for cellulitis     States that she was seen last night   Started day before yesterday  States that she has previously had cellulitis and has had to be   States that she has not been using her insulin at night    2019 hospitalization Brief/Interim Summary: 51 year old female with past medical history of insulin-dependent diabetes, morbid obesity, recurrent bilateral lower extremity cellulitis with lymphedema was transferred here from [facility] for evaluation of right lower extremity edema pain tenderness and swelling.  She was recently outpatient treated with doxycycline and Keflex for right lower extremity swelling but did not improve at all.  In the ER blood glucose was elevated greater than 500 without evidence of DKA and lactic acid was 2.8.  Her pulse was noted to be 111.  She was started initially on vancomycin and Zosyn and this was later switched to oral Bactrim once her symptoms had improved.  Patient does have allergy to vancomycin causing red man syndrome therefore was premedicated with Benadryl and infusion was slowed down.  Her home insulin was restarted and it improved her blood glucose. Today her erythema has regressed and appears much better on current antibiotics.  She is stable to be medically discharged with outpatient follow-up recommendations as stated above.      {History (Optional):23778}  ROS    Objective:     BP 132/78 (BP Location: Left Arm, Patient Position: Sitting)   Ht 5\' 1"  (1.549 m)   BMI 57.44 kg/m  {Vitals History (Optional):23777}  Physical Exam   No results found for any visits on 01/19/22.  {Labs (Optional):23779}  The ASCVD Risk score (Arnett DK, et al., 2019) failed to calculate for  the following reasons:   Cannot find a previous HDL lab   Cannot find a previous total cholesterol lab    Assessment & Plan:   Problem List Items Addressed This Visit   None   No follow-ups on file.    Loraine Grip Mayers, PA-C

## 2022-01-19 NOTE — Telephone Encounter (Signed)
  Chief Complaint: Leg Weeping Symptoms: Right lower leg with cellulitis, seen in ED yesterday. Weeping today, copious amounts clear fluid, no dressing. "I just keep wiping it, constant, comes right back." Dopplers negative Frequency: yesterday Pertinent Negatives: Patient denies  Disposition: [] ED /[] Urgent Care (no appt availability in office) / [] Appointment(In office/virtual)/ []  [facility]/ [] Home Care/ [] Refused Recommended Disposition /[x] [facility]/ []  Follow-up with PCP Additional Notes: No availability at practice, advised [facility]. States will follow disposition.  Reason for Disposition  [1] MODERATE leg swelling (e.g., swelling extends up to knees) AND [2] new-onset or worsening  Answer Assessment - Initial Assessment Questions 1. ONSET: "When did the swelling start?" (e.g., minutes, hours, days)     Seen in ED yesterday 2. LOCATION: "What part of the leg is swollen?"  "Are both legs swollen or just one leg?"     RLL 3. SEVERITY: "How bad is the swelling?" (e.g., localized; mild, moderate, severe)   - Localized: Small area of swelling localized to one leg.   - MILD pedal edema: Swelling limited to foot and ankle, pitting edema < 1/4 inch (6 mm) deep, rest and elevation eliminate most or all swelling.   - MODERATE edema: Swelling of lower leg to knee, pitting edema > 1/4 inch (6 mm) deep, rest and elevation only partially reduce swelling.   - SEVERE edema: Swelling extends above knee, facial or hand swelling present.      Weeping 4. REDNESS: "Does the swelling look red or infected?"     No 5. PAIN: "Is the swelling painful to touch?" If Yes, ask: "How painful is it?"   (Scale 1-10; mild, moderate or severe)     Mild 6. FEVER: "Do you have a fever?" If Yes, ask: "What is it, how was it measured, and when did it start?"      no 7. CAUSE: "What do you think is causing the leg swelling?"     Cellulitis. 8. MEDICAL HISTORY: "Do you have a history of  blood clots (e.g., DVT), cancer, heart failure, kidney disease, or liver failure?"     Dopplers neg 9. RECURRENT SYMPTOM: "Have you had leg swelling before?" If Yes, ask: "When was the last time?" "What happened that time?"     no 10. OTHER SYMPTOMS: "Do you have any other symptoms?" (e.g., chest pain, difficulty breathing)       No  Protocols used: Leg Swelling and Edema-A-AH

## 2022-01-20 ENCOUNTER — Encounter: Payer: Self-pay | Admitting: Physician Assistant

## 2022-01-21 ENCOUNTER — Encounter (HOSPITAL_BASED_OUTPATIENT_CLINIC_OR_DEPARTMENT_OTHER): Payer: Self-pay | Admitting: Emergency Medicine

## 2022-01-21 ENCOUNTER — Emergency Department (HOSPITAL_BASED_OUTPATIENT_CLINIC_OR_DEPARTMENT_OTHER)
Admission: EM | Admit: 2022-01-21 | Discharge: 2022-01-21 | Disposition: A | Discharge status: Home / Self Care | Payer: Commercial Managed Care - HMO | Attending: Emergency Medicine | Admitting: Emergency Medicine

## 2022-01-21 ENCOUNTER — Other Ambulatory Visit: Payer: Self-pay

## 2022-01-21 DIAGNOSIS — L03115 Cellulitis of right lower limb: Secondary | ICD-10-CM | POA: Diagnosis present

## 2022-01-21 MED ORDER — SULFAMETHOXAZOLE-TRIMETHOPRIM 800-160 MG PO TABS: 1.0000 | ORAL_TABLET | Freq: Two times a day (BID) | ORAL | 0 refills | Status: AC

## 2022-01-21 MED ORDER — AMOXICILLIN-POT CLAVULANATE 875-125 MG PO TABS
1.0000 | ORAL_TABLET | Freq: Once | ORAL | Status: AC
  Administered 2022-01-21: 1 via ORAL
  Filled 2022-01-21: qty 1

## 2022-01-21 MED ORDER — SULFAMETHOXAZOLE-TRIMETHOPRIM 800-160 MG PO TABS
1.0000 | ORAL_TABLET | Freq: Once | ORAL | Status: AC
  Administered 2022-01-21: 1 via ORAL
  Filled 2022-01-21: qty 1

## 2022-01-21 MED ORDER — AMOXICILLIN-POT CLAVULANATE 875-125 MG PO TABS: 1.0000 | ORAL_TABLET | Freq: Two times a day (BID) | ORAL | 0 refills | Status: DC

## 2022-01-21 NOTE — ED Provider Notes (Signed)
[facility] EMERGENCY DEPARTMENT Provider Note   CSN: [id number] Arrival date & time: 01/21/22  8101     History  Chief Complaint  Patient presents with   Cellulitis    [name] is a 51 y.o. female.  51 yo F with a chief complaints of right leg pain and swelling.  This has been going on for a few days now.  She was seen 3 days ago and was started on clindamycin.  She feels like it is worsening and she started to experience some subjective chills.  She thinks maybe she was scratched by a cat at the onset but is not sure.  Denies any other obvious injury.  She had had some oozing and so had placed a bandage on her leg.        Home Medications Prior to Admission medications   Medication Sig Start Date End Date Taking? Authorizing Provider  amoxicillin-clavulanate (AUGMENTIN) 875-125 MG tablet Take 1 tablet by mouth every 12 (twelve) hours. 01/21/22  Yes Deno Etienne, DO  sulfamethoxazole-trimethoprim (BACTRIM DS) 800-160 MG tablet Take 1 tablet by mouth 2 (two) times daily for 7 days. 01/21/22 01/28/22 Yes Deno Etienne, DO  acetaminophen (TYLENOL) 500 MG tablet Take 1 tablet (500 mg total) by mouth every 6 (six) hours as needed. 09/30/21   Varney Biles, MD  Blood Glucose Monitoring Suppl (TRUE METRIX METER) w/Device KIT Use as directed 11/10/20   Camillia Herter, NP  gabapentin (NEURONTIN) 100 MG capsule Take 1 capsule (100 mg total) by mouth at bedtime. 01/15/21 02/14/21  Camillia Herter, NP  glimepiride (AMARYL) 1 MG tablet TAKE 1 TABLET(1 MG) BY MOUTH TWICE DAILY WITH A MEAL 09/07/21   Camillia Herter, NP  glucose blood (TRUE METRIX BLOOD GLUCOSE TEST) test strip Use as instructed 11/10/20   Camillia Herter, NP  ibuprofen (ADVIL) 600 MG tablet Take 1 tablet (600 mg total) by mouth every 8 (eight) hours as needed (pain). 11/16/21   Barrett Henle, MD  Insulin Pen Needle (PEN NEEDLES) 31G X 8 MM MISC UAD 03/01/21   Camillia Herter, NP  LANTUS SOLOSTAR 100 UNIT/ML  Solostar Pen ADMINISTER 13 UNITS UNDER THE SKIN AT BEDTIME 07/06/21   Camillia Herter, NP  metFORMIN (GLUCOPHAGE-XR) 500 MG 24 hr tablet TAKE 2 TABLETS(1000 MG) BY MOUTH TWICE DAILY WITH A MEAL 09/07/21   Camillia Herter, NP  omeprazole (PRILOSEC) 40 MG capsule Take 40 mg by mouth daily.    [provider]  sucralfate (CARAFATE) 1 g tablet Take 1 tablet (1 g total) by mouth 4 (four) times daily -  with meals and at bedtime for 7 days. 10/25/21 11/01/21  Jeanell Sparrow, DO  TRUEplus Lancets 28G MISC Use as directed 11/10/20   Camillia Herter, NP  pantoprazole (PROTONIX) 20 MG tablet Take 1 tablet (20 mg total) by mouth daily. 05/26/17 06/27/20  Malvin Johns, MD      Allergies    Diclofenac, Vancomycin, and Adhesive [tape]    Review of Systems   Review of Systems  Physical Exam Updated Vital Signs BP (!) 154/88 (BP Location: Right Arm)   Pulse 100   Temp 98.1 F (36.7 C) (Oral)   Resp 16   Ht 5' 1"  (1.549 m)   Wt (!) 137.9 kg   SpO2 97%   BMI 57.44 kg/m  Physical Exam Vitals and nursing note reviewed.  Constitutional:      General: She is not in acute distress.  Appearance: She is well-developed. She is not diaphoretic.     Comments: BMI 58  HENT:     Head: Normocephalic and atraumatic.  Eyes:     Pupils: Pupils are equal, round, and reactive to light.  Cardiovascular:     Rate and Rhythm: Normal rate and regular rhythm.     Heart sounds: No murmur heard.    No friction rub. No gallop.  Pulmonary:     Effort: Pulmonary effort is normal.     Breath sounds: No wheezing or rales.  Abdominal:     General: There is no distension.     Palpations: Abdomen is soft.     Tenderness: There is no abdominal tenderness.  Musculoskeletal:        General: Swelling and tenderness present.     Cervical back: Normal range of motion and neck supple.     Comments: R leg pain and swelling, small localized area of erythema.  No appreciable fluctuance or induration.  No drainage.   Skin:    General: Skin is warm and dry.  Neurological:     Mental Status: She is alert and oriented to person, place, and time.  Psychiatric:        Behavior: Behavior normal.     ED Results / Procedures / Treatments   Labs (all labs ordered are listed, but only abnormal results are displayed) Labs Reviewed - No data to display  EKG None  Radiology No results found.  Procedures Procedures    Medications Ordered in ED Medications  amoxicillin-clavulanate (AUGMENTIN) 875-125 MG per tablet 1 tablet (has no administration in time range)  sulfamethoxazole-trimethoprim (BACTRIM DS) 800-160 MG per tablet 1 tablet (has no administration in time range)    ED Course/ Medical Decision Making/ A&P                           Medical Decision Making Risk Prescription drug management.   51 yo F with a chief complaints of right leg pain and swelling.  This has been going on for about 3 to 4 days now.  Was seen at the onset in the emergency department and had a work-up, treated as cellulitis with clindamycin.  Patient feels like it is gotten worse at home.  She has a very small localized area of erythema.  She is otherwise well-appearing nontoxic.  Afebrile here.  Will change her antibiotic course.  The patient is concerned because she does not think that oral antibiotics work for her.  I had a long discussion with her about the most recent research and would suggest that IV antibiotics and actually help in skin and soft tissue infections.  As she was concerned about possible cat involvement will start on Augmentin.  We will add Bactrim for possible MRSA.  We will have the patient follow-up with her family doctor.  Have her return for worsening.  There is been a recent evidence to suggest that IV antibiotics do not help and treatment for skin and soft tissue infections.  This includes a Cochrane review. Kilburn, SA et al. Interventions for cellulitis and erysipelas. Cochrane Database Syst  Rev. 2010 Jun 16;(6):CD004299. PMID: 97353299 Aboltins, CA et al. Oral versus parenteral antimicrobials for the treatment of cellulitis: a randomized non-inferiority trial. J Antimicrob Chemother. 2015 Feb;70(2):581-6.PMID: 24268341   7:22 AM:  I have discussed the diagnosis/risks/treatment options with the patient.  Evaluation and diagnostic testing in the emergency department does not suggest an emergent  condition requiring admission or immediate intervention beyond what has been performed at this time.  They will follow up with PCP. We also discussed returning to the ED immediately if new or worsening sx occur. We discussed the sx which are most concerning (e.g., sudden worsening pain, fever, inability to tolerate by mouth) that necessitate immediate return. Medications administered to the patient during their visit and any new prescriptions provided to the patient are listed below.  Medications given during this visit Medications  amoxicillin-clavulanate (AUGMENTIN) 875-125 MG per tablet 1 tablet (has no administration in time range)  sulfamethoxazole-trimethoprim (BACTRIM DS) 800-160 MG per tablet 1 tablet (has no administration in time range)     The patient appears reasonably screen and/or stabilized for discharge and I doubt any other medical condition or other [facility] requiring further screening, evaluation, or treatment in the ED at this time prior to discharge.          Final Clinical Impression(s) / ED Diagnoses Final diagnoses:  Cellulitis of right lower extremity    Rx / DC Orders ED Discharge Orders          Ordered    amoxicillin-clavulanate (AUGMENTIN) 875-125 MG tablet  Every 12 hours        01/21/22 0712    sulfamethoxazole-trimethoprim (BACTRIM DS) 800-160 MG tablet  2 times daily        01/21/22 Cobden, [place], DO 01/21/22 6270

## 2022-01-21 NOTE — ED Triage Notes (Signed)
Pt states she was seen here on Tuesday for swelling in her right lower leg  Pt states she was sent home on po antibiotics  Pt states today she has redness to that area and has nausea, headache, and chills

## 2022-01-21 NOTE — Discharge Instructions (Addendum)
Return for rapid spreading redness or if you develop a fever.  Stop the clindamycin.  I have started you on 2 separate antibiotics.

## 2022-01-24 NOTE — Progress Notes (Signed)
REYES, FIFIELD ([id number]) [id number] Page 1 of 1 Visit Report for 01/25/2022 Allergy List Details Patient Name: Date of Service: [name], [name] 01/25/2022 9:30 A M Medical Record Number: [id number] Patient Account Number: [id number] Date of Birth/Sex: Treating RN: November 30, 1970 (51 y.o. Sue Lush Primary Care Alizia Greif: Other Clinician: Referring Damontay Alred: Treating Bailey Kolbe/Extender: Yaakov Guthrie in Treatment: 0 Allergies Active Allergies diclofenac vancomycin adhesive Allergy Notes Electronic Signature(s) Signed: 01/24/2022 8:12:29 AM By: Lorrin Jackson Entered By: Lorrin Jackson on 01/24/2022 08:12:29

## 2022-01-25 ENCOUNTER — Encounter (HOSPITAL_BASED_OUTPATIENT_CLINIC_OR_DEPARTMENT_OTHER): Payer: Commercial Managed Care - HMO | Admitting: Internal Medicine

## 2022-02-16 NOTE — Progress Notes (Signed)
Erroneous encounter-disregard

## 2022-02-17 ENCOUNTER — Emergency Department (HOSPITAL_BASED_OUTPATIENT_CLINIC_OR_DEPARTMENT_OTHER)
Admission: EM | Admit: 2022-02-17 | Discharge: 2022-02-17 | Disposition: A | Discharge status: Home / Self Care | Payer: 59 | Attending: Emergency Medicine | Admitting: Emergency Medicine

## 2022-02-17 ENCOUNTER — Other Ambulatory Visit: Payer: Self-pay

## 2022-02-17 ENCOUNTER — Emergency Department (HOSPITAL_BASED_OUTPATIENT_CLINIC_OR_DEPARTMENT_OTHER): Payer: 59

## 2022-02-17 ENCOUNTER — Encounter (HOSPITAL_BASED_OUTPATIENT_CLINIC_OR_DEPARTMENT_OTHER): Payer: Self-pay | Admitting: Emergency Medicine

## 2022-02-17 DIAGNOSIS — R059 Cough, unspecified: Secondary | ICD-10-CM | POA: Insufficient documentation

## 2022-02-17 DIAGNOSIS — Z20822 Contact with and (suspected) exposure to covid-19: Secondary | ICD-10-CM | POA: Diagnosis not present

## 2022-02-17 DIAGNOSIS — R079 Chest pain, unspecified: Secondary | ICD-10-CM | POA: Insufficient documentation

## 2022-02-17 DIAGNOSIS — Z794 Long term (current) use of insulin: Secondary | ICD-10-CM | POA: Insufficient documentation

## 2022-02-17 DIAGNOSIS — R197 Diarrhea, unspecified: Secondary | ICD-10-CM | POA: Insufficient documentation

## 2022-02-17 DIAGNOSIS — Z7984 Long term (current) use of oral hypoglycemic drugs: Secondary | ICD-10-CM | POA: Insufficient documentation

## 2022-02-17 DIAGNOSIS — E119 Type 2 diabetes mellitus without complications: Secondary | ICD-10-CM | POA: Diagnosis not present

## 2022-02-17 LAB — RESP PANEL BY RT-PCR (FLU A&B, COVID) ARPGX2
Influenza A by PCR: NEGATIVE
Influenza B by PCR: NEGATIVE
SARS Coronavirus 2 by RT PCR: NEGATIVE

## 2022-02-17 NOTE — ED Triage Notes (Signed)
Pt having headache, cough,  nonproductive, diarrhea, some nausea, cold chills for one week.  Eye pain.

## 2022-02-17 NOTE — ED Notes (Signed)
Discharge instructions reviewed with patient. Patient verbalizes understanding, no further questions at this time. Medications and follow up information provided. No acute distress noted at time of departure.  

## 2022-02-17 NOTE — ED Provider Notes (Signed)
[facility] EMERGENCY DEPARTMENT Provider Note   CSN: [id number] Arrival date & time: 02/17/22  1022     History  Chief Complaint  Patient presents with   URI   HPI [name] is a 51 y.o. female with GERD and diabetes presenting for concerns of URI and diarrhea. Started this past Saturday. Has been ongoing which prompted her to be evaluated today in the emergency department. Denies fever.  States that she has had a cough but it has been nonproductive.  Denies SOB. Also endorses associated chest pain.  Chest pain is worse with coughing and reproducible.  Chest pain is also nonradiating.  Denies sick contacts. Denies calf tenderness.    URI Presenting symptoms: congestion and cough        Home Medications Prior to Admission medications   Medication Sig Start Date End Date Taking? Authorizing Provider  acetaminophen (TYLENOL) 500 MG tablet Take 1 tablet (500 mg total) by mouth every 6 (six) hours as needed. 09/30/21   Varney Biles, MD  amoxicillin-clavulanate (AUGMENTIN) 875-125 MG tablet Take 1 tablet by mouth every 12 (twelve) hours. 01/21/22   Deno Etienne, DO  Blood Glucose Monitoring Suppl (TRUE METRIX METER) w/Device KIT Use as directed 11/10/20   Camillia Herter, NP  gabapentin (NEURONTIN) 100 MG capsule Take 1 capsule (100 mg total) by mouth at bedtime. 01/15/21 02/14/21  Camillia Herter, NP  glimepiride (AMARYL) 1 MG tablet TAKE 1 TABLET(1 MG) BY MOUTH TWICE DAILY WITH A MEAL 09/07/21   Camillia Herter, NP  glucose blood (TRUE METRIX BLOOD GLUCOSE TEST) test strip Use as instructed 11/10/20   Camillia Herter, NP  ibuprofen (ADVIL) 600 MG tablet Take 1 tablet (600 mg total) by mouth every 8 (eight) hours as needed (pain). 11/16/21   Barrett Henle, MD  Insulin Pen Needle (PEN NEEDLES) 31G X 8 MM MISC UAD 03/01/21   Camillia Herter, NP  LANTUS SOLOSTAR 100 UNIT/ML Solostar Pen ADMINISTER 13 UNITS UNDER THE SKIN AT BEDTIME 07/06/21   Camillia Herter, NP   metFORMIN (GLUCOPHAGE-XR) 500 MG 24 hr tablet TAKE 2 TABLETS(1000 MG) BY MOUTH TWICE DAILY WITH A MEAL 09/07/21   Camillia Herter, NP  omeprazole (PRILOSEC) 40 MG capsule Take 40 mg by mouth daily.    [provider]  sucralfate (CARAFATE) 1 g tablet Take 1 tablet (1 g total) by mouth 4 (four) times daily -  with meals and at bedtime for 7 days. 10/25/21 11/01/21  Jeanell Sparrow, DO  TRUEplus Lancets 28G MISC Use as directed 11/10/20   Camillia Herter, NP  pantoprazole (PROTONIX) 20 MG tablet Take 1 tablet (20 mg total) by mouth daily. 05/26/17 06/27/20  Malvin Johns, MD      Allergies    Diclofenac, Vancomycin, and Adhesive [tape]    Review of Systems   Review of Systems  HENT:  Positive for congestion.   Respiratory:  Positive for cough and shortness of breath.   Cardiovascular:  Positive for chest pain.    Physical Exam Updated Vital Signs BP (!) 108/52   Pulse 91   Temp 98.2 F (36.8 C) (Oral)   Resp (!) 22   Ht _0  (1.549 m)   Wt (!) 138.5 kg   SpO2 99%   BMI 57.69 kg/m  Physical Exam Vitals and nursing note reviewed.  HENT:     Head: Normocephalic and atraumatic.     Mouth/Throat:     Mouth: Mucous membranes  are moist.  Eyes:     General:        Right eye: No discharge.        Left eye: No discharge.     Conjunctiva/sclera: Conjunctivae normal.  Cardiovascular:     Rate and Rhythm: Normal rate and regular rhythm.     Pulses: Normal pulses.     Heart sounds: Normal heart sounds.  Pulmonary:     Effort: Pulmonary effort is normal.     Breath sounds: Normal breath sounds.  Chest:     Comments: TTP of the middle of the chest about the sternum Abdominal:     General: Abdomen is flat.     Palpations: Abdomen is soft.  Skin:    General: Skin is warm and dry.  Neurological:     General: No focal deficit present.  Psychiatric:        Mood and Affect: Mood normal.     ED Results / Procedures / Treatments   Labs (all labs ordered are listed, but only  abnormal results are displayed) Labs Reviewed  RESP PANEL BY RT-PCR (FLU A&B, COVID) [id number]    EKG EKG Interpretation  Date/Time:  Thursday February 17 2022 15:22:40 EDT Ventricular Rate:  96 PR Interval:  192 QRS Duration: 78 QT Interval:  357 QTC Calculation: 452 R Axis:   19 Text Interpretation: Sinus rhythm Low voltage, precordial leads When compared to 10/25/21 no significant change was found Confirmed by Margaretmary Eddy [phone]) on 02/17/2022 4:13:20 PM  Radiology DG Chest 2 View  Result Date: 02/17/2022 CLINICAL DATA:  Provided history: Cough. Additional history provided: Shortness of breath, diarrhea. EXAM: CHEST - 2 VIEW COMPARISON:  Prior chest radiographs 10/25/2021 and earlier. FINDINGS: Heart size within normal limits. No appreciable airspace consolidation. No evidence of pleural effusion or pneumothorax. Degenerative changes of the spine. IMPRESSION: No evidence of active cardiopulmonary disease. Electronically Signed   By: Kellie Simmering D.O.   On: 02/17/2022 11:57    Procedures Procedures    Medications Ordered in ED Medications - No data to display  ED Course/ Medical Decision Making/ A&P                           Medical Decision Making Amount and/or Complexity of Data Reviewed Radiology: ordered.   Patient presented for concerns of URI and diarrhea. Differential diagnosis for this complaint includes viral URI, pneumonia, ACS, flu and COVID.  Doubt ACS given chest pain is reproducible and worse with coughing along with reassuring EKG. Also considered pneumonia but unlikely given reassuring x-ray and patient afebrile.  Also considered flu and COVID but unlikely given rapid test. Symptoms are consistent with ongoing viral URI. Discussed conservative treatment with patient and advised to follow-up with PCP if symptoms persist.  Discussed return precautions.        Final Clinical Impression(s) / ED Diagnoses Final diagnoses:  Cough, unspecified type  Chest  pain, unspecified type  Diarrhea, unspecified type    Rx / DC Orders ED Discharge Orders     None         Harriet Pho, PA-C 02/17/22 1616    Fransico Meadow, MD 02/18/22 1121

## 2022-02-17 NOTE — Discharge Instructions (Signed)
Evaluation for your congestion and cough was overall reassuring. Xray was normal.  Symptoms are consistent with an upper respiratory infection.  Recommend conservative treatment at home which includes rest good hydration.  Also can take Tylenol and ibuprofen as needed for symptomatic relief.  Recommend that you follow-up with your PCP for reevaluation.  If you have new short of breath, new chest pain, and calf tenderness please return to the emergency department for further evaluation.

## 2022-02-21 NOTE — Progress Notes (Deleted)
Name: [name]  MRN/ DOB: [id number], Jul 10, 1970   Age/ Sex: 51 y.o., female    PCP: Camillia Herter, NP   Reason for Endocrinology Evaluation: Type 2 Diabetes Mellitus     Date of Initial Endocrinology Visit: 02/21/2022     PATIENT IDENTIFIER: [name] is a 51 y.o. female with a past medical history of ***. The patient presented for initial endocrinology clinic visit on 02/21/2022 for consultative assistance with her diabetes management.    HPI: [name] was    Diagnosed with DM *** Prior Medications tried/Intolerance: *** Currently checking blood sugars *** x / day,  before breakfast and ***.  Hypoglycemia episodes : ***               Symptoms: ***                 Frequency: ***/  Hemoglobin A1c has ranged from 6.9% in 2009, peaking at 12.9% in 2023. Patient required assistance for hypoglycemia:  Patient has required hospitalization within the last 1 year from hyper or hypoglycemia: Over the past 6 months she has had multiple ED visits for variable reasons including neck pain, cellulitis, arm pain  In terms of diet, the patient ***   HOME DIABETES REGIMEN: Metformin 500 mg XR Glimepiride 4 mg Lantus   Statin: no ACE-I/ARB: no Prior Diabetic Education: {Yes/No:11203}   METER DOWNLOAD SUMMARY: Date range evaluated: *** Fingerstick Blood Glucose Tests = *** Average Number Tests/Day = *** Overall Mean FS Glucose = *** Standard Deviation = ***  BG Ranges: Low = *** High = ***   Hypoglycemic Events/30 Days: BG < 50 = *** Episodes of symptomatic severe hypoglycemia = ***   DIABETIC COMPLICATIONS: Microvascular complications:  *** Denies: CKD Last eye exam: Completed   Macrovascular complications:  *** Denies: CAD, PVD, CVA   PAST HISTORY: Past Medical History:  Past Medical History:  Diagnosis Date   Cellulitis 07/2011   Diabetes mellitus    dx 2016   GERD (gastroesophageal reflux disease)    Past Surgical History:  Past  Surgical History:  Procedure Laterality Date   ABLATION     CARPAL TUNNEL RELEASE Left 09/13/2016   Procedure: LEFT CARPAL TUNNEL RELEASE;  Surgeon: Daryll Brod, MD;  Location: [facility];  Service: Orthopedics;  Laterality: Left;   CHOLECYSTECTOMY     KNEE ARTHROSCOPY     LAPAROSCOPIC TUBAL LIGATION  04/29/2011   Procedure: LAPAROSCOPIC TUBAL LIGATION;  Surgeon: Luz Lex, MD;  Location: [facility];  Service: Gynecology;  Laterality: Bilateral;  with Filshie Clips   ROTATOR CUFF REPAIR     TUBAL LIGATION      Social History:  reports that she quit smoking about 10 years ago. Her smoking use included cigarettes. She has a 1.00 pack-year smoking history. She has been exposed to tobacco smoke. She has never used smokeless tobacco. She reports current alcohol use. She reports that she does not use drugs. Family History:  Family History  Problem Relation Age of Onset   Cancer Mother    Diabetes Mother    Hypertension Mother      HOME MEDICATIONS: Allergies as of 02/22/2022       Reactions   Diclofenac Other (See Comments)   "red man syndrome"   Vancomycin Other (See Comments)   Red man syndrome   Adhesive [tape] Itching, Rash        Medication List        Accurate as of February 21, 2022  2:19 PM. If you have any questions, ask your nurse or doctor.          acetaminophen 500 MG tablet Commonly known as: TYLENOL Take 1 tablet (500 mg total) by mouth every 6 (six) hours as needed.   amoxicillin-clavulanate 875-125 MG tablet Commonly known as: AUGMENTIN Take 1 tablet by mouth every 12 (twelve) hours.   gabapentin 100 MG capsule Commonly known as: NEURONTIN Take 1 capsule (100 mg total) by mouth at bedtime.   glimepiride 1 MG tablet Commonly known as: AMARYL TAKE 1 TABLET(1 MG) BY MOUTH TWICE DAILY WITH A MEAL   ibuprofen 600 MG tablet Commonly known as: ADVIL Take 1 tablet (600 mg total) by mouth every 8 (eight) hours as needed (pain).   Lantus SoloStar 100 UNIT/ML  Solostar Pen Generic drug: insulin glargine ADMINISTER 13 UNITS UNDER THE SKIN AT BEDTIME   metFORMIN 500 MG 24 hr tablet Commonly known as: GLUCOPHAGE-XR TAKE 2 TABLETS(1000 MG) BY MOUTH TWICE DAILY WITH A MEAL   omeprazole 40 MG capsule Commonly known as: PRILOSEC Take 40 mg by mouth daily.   Pen Needles 31G X 8 MM Misc UAD   sucralfate 1 g tablet Commonly known as: Carafate Take 1 tablet (1 g total) by mouth 4 (four) times daily -  with meals and at bedtime for 7 days.   True Metrix Blood Glucose Test test strip Generic drug: glucose blood Use as instructed   True Metrix Meter w/Device Kit Use as directed   TRUEplus Lancets 28G Misc Use as directed         ALLERGIES: Allergies  Allergen Reactions   Diclofenac Other (See Comments)    "red man syndrome"   Vancomycin Other (See Comments)    Red man syndrome   Adhesive [Tape] Itching and Rash     REVIEW OF SYSTEMS: A comprehensive ROS was conducted with the patient and is negative except as per HPI and below:  ROS    OBJECTIVE:   VITAL SIGNS: There were no vitals taken for this visit.   PHYSICAL EXAM:  General: Pt appears well and is in NAD  Neck: General: Supple without adenopathy or carotid bruits. Thyroid: Thyroid size normal.  No goiter or nodules appreciated.   Lungs: Clear with good BS bilat with no rales, rhonchi, or wheezes  Heart: RRR   Abdomen:  soft, nontender  Extremities:  Lower extremities - No pretibial edema. No lesions.  Skin: Normal texture and temperature to palpation. No rash noted.  Neuro: MS is good with appropriate affect, pt is alert and Ox3    DM foot exam:    DATA REVIEWED:  Lab Results  Component Value Date   HGBA1C 12.9 (A) 01/19/2022   HGBA1C 10.5 (A) 06/01/2021   HGBA1C 9.1 (A) 03/01/2021    Latest Reference Range & Units 01/18/22 22:12  Sodium 135 - 145 mmol/L 133 (L)  Potassium 3.5 - 5.1 mmol/L 3.9  Chloride 98 - 111 mmol/L 97 (L)  CO2 22 - 32 mmol/L 27   Glucose 70 - 99 mg/dL 329 (H)  BUN 6 - 20 mg/dL 10  Creatinine 0.44 - 1.00 mg/dL 0.62  Calcium 8.9 - 10.3 mg/dL 8.8 (L)  Anion gap 5 - 15  9  Alkaline Phosphatase 38 - 126 U/L 124  Albumin 3.5 - 5.0 g/dL 3.3 (L)  AST 15 - 41 U/L 20  ALT 0 - 44 U/L 20  Total Protein 6.5 - 8.1 g/dL 7.2  Total Bilirubin  0.3 - 1.2 mg/dL 0.6  GFR, Estimated >60 mL/min >60  (L): Data is abnormally low (H): Data is abnormally high  ASSESSMENT / PLAN / RECOMMENDATIONS:   1) Type *** Diabetes Mellitus, ***controlled, With*** complications - Most recent A1c of *** %. Goal A1c < *** %.  ***  Plan: GENERAL: ***  MEDICATIONS: ***  EDUCATION / INSTRUCTIONS: BG monitoring instructions: Patient is instructed to check her blood sugars *** times a day, ***. Call [facility] if: BG persistently < 70  I reviewed the Rule of 15 for the treatment of hypoglycemia in detail with the patient. Literature supplied.   2) Diabetic complications:  Eye: Does *** have known diabetic retinopathy.  Neuro/ Feet: Does *** have known diabetic peripheral neuropathy. Renal: Patient does *** have known baseline CKD. She is *** on an ACEI/ARB at present.  3) Lipids: Patient is *** on a statin.    4) Hypertension: ***  at goal of < 140/90 mmHg.       Signed electronically by: Mack Guise, MD  [facility]  [facility] [address] [place], [place] [address] Phone: [phone] FAX: [phone]   CC: Camillia Herter, NP [place] [address] Phone: [phone]  Fax: [phone]    Return to Endocrinology clinic as below: Future Appointments  Date Time Provider [place]  02/22/2022  2:20 PM Sharmeka Palmisano, Melanie Crazier, MD [facility] None  02/23/2022  9:40 AM Camillia Herter, NP [facility] None

## 2022-02-22 ENCOUNTER — Ambulatory Visit: Payer: 59 | Admitting: Internal Medicine

## 2022-02-22 ENCOUNTER — Ambulatory Visit: Payer: Commercial Managed Care - HMO | Admitting: Family

## 2022-02-23 ENCOUNTER — Encounter: Payer: Commercial Managed Care - HMO | Admitting: Family

## 2022-02-23 DIAGNOSIS — Z1329 Encounter for screening for other suspected endocrine disorder: Secondary | ICD-10-CM

## 2022-02-23 DIAGNOSIS — Z124 Encounter for screening for malignant neoplasm of cervix: Secondary | ICD-10-CM

## 2022-02-23 DIAGNOSIS — Z0000 Encounter for general adult medical examination without abnormal findings: Secondary | ICD-10-CM

## 2022-02-23 DIAGNOSIS — Z1159 Encounter for screening for other viral diseases: Secondary | ICD-10-CM

## 2022-02-23 DIAGNOSIS — Z13220 Encounter for screening for lipoid disorders: Secondary | ICD-10-CM

## 2022-02-23 DIAGNOSIS — Z1231 Encounter for screening mammogram for malignant neoplasm of breast: Secondary | ICD-10-CM

## 2022-02-23 DIAGNOSIS — Z113 Encounter for screening for infections with a predominantly sexual mode of transmission: Secondary | ICD-10-CM

## 2022-05-30 ENCOUNTER — Ambulatory Visit
Admission: EM | Admit: 2022-05-30 | Discharge: 2022-05-30 | Disposition: A | Discharge status: Home / Self Care | Payer: BLUE CROSS/BLUE SHIELD | Attending: Urgent Care | Admitting: Urgent Care

## 2022-05-30 DIAGNOSIS — Z1152 Encounter for screening for COVID-19: Secondary | ICD-10-CM | POA: Diagnosis not present

## 2022-05-30 DIAGNOSIS — Z794 Long term (current) use of insulin: Secondary | ICD-10-CM | POA: Diagnosis not present

## 2022-05-30 DIAGNOSIS — B349 Viral infection, unspecified: Secondary | ICD-10-CM | POA: Diagnosis present

## 2022-05-30 DIAGNOSIS — E119 Type 2 diabetes mellitus without complications: Secondary | ICD-10-CM | POA: Diagnosis present

## 2022-05-30 DIAGNOSIS — J309 Allergic rhinitis, unspecified: Secondary | ICD-10-CM | POA: Insufficient documentation

## 2022-05-30 DIAGNOSIS — R070 Pain in throat: Secondary | ICD-10-CM | POA: Diagnosis present

## 2022-05-30 MED ORDER — PROMETHAZINE-DM 6.25-15 MG/5ML PO SYRP: 5.0000 mL | ORAL_SOLUTION | Freq: Three times a day (TID) | ORAL | 0 refills | Status: DC | PRN

## 2022-05-30 MED ORDER — PSEUDOEPHEDRINE HCL 30 MG PO TABS: 30.0000 mg | ORAL_TABLET | Freq: Three times a day (TID) | ORAL | 0 refills | Status: AC | PRN

## 2022-05-30 MED ORDER — IPRATROPIUM BROMIDE 0.03 % NA SOLN: 2.0000 | Freq: Two times a day (BID) | NASAL | 0 refills | Status: AC

## 2022-05-30 MED ORDER — LEVOCETIRIZINE DIHYDROCHLORIDE 5 MG PO TABS: 5.0000 mg | ORAL_TABLET | Freq: Every evening | ORAL | 0 refills | Status: AC

## 2022-05-30 MED ORDER — ACETAMINOPHEN 325 MG PO TABS: 650.0000 mg | ORAL_TABLET | Freq: Four times a day (QID) | ORAL | 0 refills | Status: AC | PRN

## 2022-05-30 NOTE — ED Triage Notes (Addendum)
Pt presents to the office for facial pain and pressure. Pt reports her throat is sore throat.

## 2022-05-30 NOTE — Discharge Instructions (Signed)
We will notify you of your test results as they arrive and may take between about 24 hours.  I encourage you to sign up for MyChart if you have not already done so as this can be the easiest way for [place] to communicate results to you online or through a phone app.  Generally, we only contact you if it is a positive test result.  In the meantime, if you develop worsening symptoms including fever, chest pain, shortness of breath despite our current treatment plan then please report to the emergency room as this may be a sign of worsening status from possible viral infection.  Otherwise, we will manage this as a viral syndrome. For sore throat or cough try using a honey-based tea. Use 3 teaspoons of honey with juice squeezed from half lemon. Place shaved pieces of ginger into 1/2-1 cup of water and warm over stove top. Then mix the ingredients and repeat every 4 hours as needed. Please take Tylenol 572m-650mg every 6 hours for aches and pains, fevers. Hydrate very well with at least 2 liters of water. Eat light meals such as soups to replenish electrolytes and soft fruits, veggies. Start an antihistamine like levocetirizine (580mdaily) for postnasal drainage, sinus congestion.  You can take this together with pseudoephedrine (Sudafed) at a dose of 30 mg 2-3 times a day as needed for the same kind of congestion.  Use the Atrovent nasal spray, cough medications as needed.

## 2022-05-30 NOTE — ED Provider Notes (Signed)
[facility]  Note:  This document was prepared using Systems analyst and may include unintentional dictation errors.  MRN: [id number] DOB: March 25, 1971  Subjective:   [name] is a 52 y.o. female presenting for 3 day history of sinus pressure, facial pain (R>L), puffing over her cheeks, mild cough. No chest pain, shob, wheezing. Last took Augmentin for cellulitis 01/2022. No asthma. No smoking. Has poorly controlled diabetes, treated with insulin, last A1c > 12% on 01/2022.  No current facility-administered medications for this encounter.  Current Outpatient Medications:    acetaminophen (TYLENOL) 500 MG tablet, Take 1 tablet (500 mg total) by mouth every 6 (six) hours as needed., Disp: 30 tablet, Rfl: 0   Blood Glucose Monitoring Suppl (TRUE METRIX METER) w/Device KIT, Use as directed, Disp: 1 kit, Rfl: 0   glimepiride (AMARYL) 1 MG tablet, TAKE 1 TABLET(1 MG) BY MOUTH TWICE DAILY WITH A MEAL, Disp: 180 tablet, Rfl: 0   glucose blood (TRUE METRIX BLOOD GLUCOSE TEST) test strip, Use as instructed, Disp: 100 each, Rfl: 12   ibuprofen (ADVIL) 600 MG tablet, Take 1 tablet (600 mg total) by mouth every 8 (eight) hours as needed (pain)., Disp: 15 tablet, Rfl: 0   Insulin Pen Needle (PEN NEEDLES) 31G X 8 MM MISC, UAD, Disp: 100 each, Rfl: 0   LANTUS SOLOSTAR 100 UNIT/ML Solostar Pen, ADMINISTER 13 UNITS UNDER THE SKIN AT BEDTIME, Disp: 12 mL, Rfl: 0   metFORMIN (GLUCOPHAGE-XR) 500 MG 24 hr tablet, TAKE 2 TABLETS(1000 MG) BY MOUTH TWICE DAILY WITH A MEAL, Disp: 360 tablet, Rfl: 0   omeprazole (PRILOSEC) 40 MG capsule, Take 40 mg by mouth daily., Disp: , Rfl:    amoxicillin-clavulanate (AUGMENTIN) 875-125 MG tablet, Take 1 tablet by mouth every 12 (twelve) hours., Disp: 14 tablet, Rfl: 0   gabapentin (NEURONTIN) 100 MG capsule, Take 1 capsule (100 mg total) by mouth at bedtime., Disp: 30 capsule, Rfl: 0   sucralfate (CARAFATE) 1 g tablet, Take 1  tablet (1 g total) by mouth 4 (four) times daily -  with meals and at bedtime for 7 days., Disp: 28 tablet, Rfl: 0   TRUEplus Lancets 28G MISC, Use as directed, Disp: 100 each, Rfl: 4   Allergies  Allergen Reactions   Diclofenac Other (See Comments)    "red man syndrome"   Vancomycin Other (See Comments)    Red man syndrome   Adhesive [Tape] Itching and Rash    Past Medical History:  Diagnosis Date   Cellulitis 07/2011   Diabetes mellitus    dx 2016   GERD (gastroesophageal reflux disease)      Past Surgical History:  Procedure Laterality Date   ABLATION     CARPAL TUNNEL RELEASE Left 09/13/2016   Procedure: LEFT CARPAL TUNNEL RELEASE;  Surgeon: Daryll Brod, MD;  Location: [facility];  Service: Orthopedics;  Laterality: Left;   CHOLECYSTECTOMY     KNEE ARTHROSCOPY     LAPAROSCOPIC TUBAL LIGATION  04/29/2011   Procedure: LAPAROSCOPIC TUBAL LIGATION;  Surgeon: Luz Lex, MD;  Location: [facility];  Service: Gynecology;  Laterality: Bilateral;  with Filshie Clips   ROTATOR CUFF REPAIR     TUBAL LIGATION      Family History  Problem Relation Age of Onset   Cancer Mother    Diabetes Mother    Hypertension Mother     Social History   Tobacco Use   Smoking status: Former    Packs/day: 0.50  Years: 2.00    Total pack years: 1.00    Types: Cigarettes    Quit date: 07/22/2011    Years since quitting: 10.8    Passive exposure: Past   Smokeless tobacco: Never  Vaping Use   Vaping Use: Never used  Substance Use Topics   Alcohol use: Yes    Alcohol/week: 0.0 standard drinks of alcohol    Comment: occ   Drug use: No    ROS   Objective:   Vitals: BP 100/68 (BP Location: Left Arm)   Pulse 100   Temp 97.8 F (36.6 C) (Oral)   Resp 18   SpO2 99%   Physical Exam Constitutional:      General: She is not in acute distress.    Appearance: Normal appearance. She is well-developed and normal weight. She is not ill-appearing, toxic-appearing or diaphoretic.  HENT:      Head: Normocephalic and atraumatic.     Right Ear: Tympanic membrane, ear canal and external ear normal. No drainage or tenderness. No middle ear effusion. There is no impacted cerumen. Tympanic membrane is not erythematous or bulging.     Left Ear: Tympanic membrane, ear canal and external ear normal. No drainage or tenderness.  No middle ear effusion. There is no impacted cerumen. Tympanic membrane is not erythematous or bulging.     Nose: Nose normal. No congestion or rhinorrhea.     Mouth/Throat:     Mouth: Mucous membranes are moist. No oral lesions.     Pharynx: No pharyngeal swelling, oropharyngeal exudate, posterior oropharyngeal erythema or uvula swelling.     Tonsils: No tonsillar exudate or tonsillar abscesses.  Eyes:     General: No scleral icterus.       Right eye: No discharge.        Left eye: No discharge.     Extraocular Movements: Extraocular movements intact.     Right eye: Normal extraocular motion.     Left eye: Normal extraocular motion.     Conjunctiva/sclera: Conjunctivae normal.  Cardiovascular:     Rate and Rhythm: Normal rate and regular rhythm.     Heart sounds: Normal heart sounds. No murmur heard.    No friction rub. No gallop.  Pulmonary:     Effort: Pulmonary effort is normal. No respiratory distress.     Breath sounds: No stridor. No wheezing, rhonchi or rales.  Chest:     Chest wall: No tenderness.  Musculoskeletal:     Cervical back: Normal range of motion and neck supple.  Lymphadenopathy:     Cervical: No cervical adenopathy.  Skin:    General: Skin is warm and dry.  Neurological:     General: No focal deficit present.     Mental Status: She is alert and oriented to person, place, and time.  Psychiatric:        Mood and Affect: Mood normal.        Behavior: Behavior normal.    Rapid strep was negative.  Assessment and Plan :   PDMP not reviewed this encounter.  1. Acute viral syndrome   2. Throat pain   3. Type 2 diabetes mellitus  treated with insulin ([place])   4. Allergic rhinitis, unspecified seasonality, unspecified trigger     Deferred imaging given clear cardiopulmonary exam, hemodynamically stable vital signs. Will defer antibiotic use, uphold antibiotic stewardship. Will manage for viral illness such as viral URI, viral syndrome, viral rhinitis, COVID-19, viral pharyngitis. Recommended supportive care. Offered scripts for symptomatic relief.  COVID 19 and strep culture are pending. Counseled patient on potential for adverse effects with medications prescribed/recommended today, ER and return-to-clinic precautions discussed, patient verbalized understanding.     Jaynee Eagles, [place] 05/30/22 1656

## 2022-05-31 LAB — SARS CORONAVIRUS 2 (TAT 6-24 HRS): SARS Coronavirus 2: NEGATIVE

## 2022-06-02 ENCOUNTER — Ambulatory Visit (INDEPENDENT_AMBULATORY_CARE_PROVIDER_SITE_OTHER): Payer: BLUE CROSS/BLUE SHIELD

## 2022-06-02 ENCOUNTER — Ambulatory Visit
Admission: EM | Admit: 2022-06-02 | Discharge: 2022-06-02 | Disposition: A | Discharge status: Home / Self Care | Payer: BLUE CROSS/BLUE SHIELD | Attending: Nurse Practitioner | Admitting: Nurse Practitioner

## 2022-06-02 DIAGNOSIS — R051 Acute cough: Secondary | ICD-10-CM

## 2022-06-02 DIAGNOSIS — B349 Viral infection, unspecified: Secondary | ICD-10-CM

## 2022-06-02 DIAGNOSIS — R112 Nausea with vomiting, unspecified: Secondary | ICD-10-CM

## 2022-06-02 LAB — CULTURE, GROUP A STREP (THRC)

## 2022-06-02 MED ORDER — ONDANSETRON 4 MG PO TBDP: 4.0000 mg | ORAL_TABLET | Freq: Three times a day (TID) | ORAL | 0 refills | Status: DC | PRN

## 2022-06-02 NOTE — ED Provider Notes (Signed)
[facility]    CSN: [id number] Arrival date & time: 06/02/22  1708      History   Chief Complaint Chief Complaint  Patient presents with   Headache    Headache, cough, chest congestion, diarrhea and nasal congestion. X3 days    HPI [name] is a 52 y.o. female  presents for evaluation of URI symptoms for 6 days. Patient reports associated symptoms of dry cough with congestion, headache, vomiting, diarrhea and sore throat. Denies reports subjective fevers but denies body aches or shortness of breath. Patient does not have a hx of asthma or smoking.  Patient has poorly controlled type II diabetic.  She was seen in urgent care on 2/12 for 3 days of same symptoms.  She had a negative COVID PCR and rapid strep/throat culture.  She was prescribed allergy medication and Promethazine DM cough syrup, pseudoephedrine, Tylenol, and nasal spray.  She states she feels her cough is worsening.  Pt has taken nothing OTC for symptoms. Pt has no other concerns at this time.    Headache Associated symptoms: congestion, cough, diarrhea, sore throat and vomiting     Past Medical History:  Diagnosis Date   Cellulitis 07/2011   Diabetes mellitus    dx 2016   GERD (gastroesophageal reflux disease)     Patient Active Problem List   Diagnosis Date Noted   Left lumbar radiculopathy 02/22/2019   Trigger finger of left thumb 01/07/2019   Cellulitis and abscess of right leg 01/02/2018   Bilateral carpal tunnel syndrome 07/06/2016   Left elbow pain 02/17/2016   Uncontrolled type 2 diabetes mellitus with hyperglycemia ([place]) 10/29/2015   Uncontrolled type 2 diabetes mellitus without complication, with long-term current use of insulin 10/29/2015   Injury of right hand 05/19/2015   Cervical disc disorder with radiculopathy, unspecified cervical region 10/10/2013   Diabetes ([place]) 04/07/2013   Pyelonephritis 04/06/2013   RLQ abdominal pain 04/06/2013   Fever 09/07/2012    Headache(784.0) 09/07/2012   Cellulitis 08/05/2011   Peripheral edema 08/05/2011   Gastroesophageal reflux disease 08/05/2011   BMI 50.0-59.9, adult ([place]) 08/05/2011    Past Surgical History:  Procedure Laterality Date   ABLATION     CARPAL TUNNEL RELEASE Left 09/13/2016   Procedure: LEFT CARPAL TUNNEL RELEASE;  Surgeon: Daryll Brod, MD;  Location: [facility];  Service: Orthopedics;  Laterality: Left;   CHOLECYSTECTOMY     KNEE ARTHROSCOPY     LAPAROSCOPIC TUBAL LIGATION  04/29/2011   Procedure: LAPAROSCOPIC TUBAL LIGATION;  Surgeon: Luz Lex, MD;  Location: [facility];  Service: Gynecology;  Laterality: Bilateral;  with Filshie Clips   ROTATOR CUFF REPAIR     TUBAL LIGATION      OB History   No obstetric history on file.      Home Medications    Prior to Admission medications   Medication Sig Start Date End Date Taking? Authorizing Provider  acetaminophen (TYLENOL) 325 MG tablet Take 2 tablets (650 mg total) by mouth every 6 (six) hours as needed for moderate pain. 05/30/22  Yes Jaynee Eagles, PA-C  amoxicillin-clavulanate (AUGMENTIN) 875-125 MG tablet Take 1 tablet by mouth every 12 (twelve) hours. 01/21/22  Yes Deno Etienne, DO  Blood Glucose Monitoring Suppl (TRUE METRIX METER) w/Device KIT Use as directed 11/10/20  Yes Minette Brine, Amy J, NP  glimepiride (AMARYL) 1 MG tablet TAKE 1 TABLET(1 MG) BY MOUTH TWICE DAILY WITH A MEAL 09/07/21  Yes Minette Brine, Amy J, NP  glucose blood (TRUE METRIX  BLOOD GLUCOSE TEST) test strip Use as instructed 11/10/20  Yes Minette Brine, Amy J, NP  ibuprofen (ADVIL) 600 MG tablet Take 1 tablet (600 mg total) by mouth every 8 (eight) hours as needed (pain). 11/16/21  Yes Barrett Henle, MD  Insulin Pen Needle (PEN NEEDLES) 31G X 8 MM MISC UAD 03/01/21  Yes Minette Brine, Amy J, NP  ipratropium (ATROVENT) 0.03 % nasal spray Place 2 sprays into both nostrils 2 (two) times daily. 05/30/22  Yes Jaynee Eagles, PA-C  LANTUS SOLOSTAR 100 UNIT/ML Solostar Pen ADMINISTER 13 UNITS UNDER  THE SKIN AT BEDTIME 07/06/21  Yes Minette Brine, Amy J, NP  levocetirizine (XYZAL) 5 MG tablet Take 1 tablet (5 mg total) by mouth every evening. 05/30/22  Yes Jaynee Eagles, PA-C  metFORMIN (GLUCOPHAGE-XR) 500 MG 24 hr tablet TAKE 2 TABLETS(1000 MG) BY MOUTH TWICE DAILY WITH A MEAL 09/07/21  Yes Minette Brine, Amy J, NP  omeprazole (PRILOSEC) 40 MG capsule Take 40 mg by mouth daily.   Yes [provider]  ondansetron (ZOFRAN-ODT) 4 MG disintegrating tablet Take 1 tablet (4 mg total) by mouth every 8 (eight) hours as needed for nausea or vomiting. 06/02/22  Yes Melynda Ripple, NP  promethazine-dextromethorphan (PROMETHAZINE-DM) 6.25-15 MG/5ML syrup Take 5 mLs by mouth 3 (three) times daily as needed for cough. 05/30/22  Yes Jaynee Eagles, PA-C  pseudoephedrine (SUDAFED) 30 MG tablet Take 1 tablet (30 mg total) by mouth every 8 (eight) hours as needed for congestion. 05/30/22  Yes Jaynee Eagles, PA-C  TRUEplus Lancets 28G MISC Use as directed 11/10/20  Yes Minette Brine, Amy J, NP  gabapentin (NEURONTIN) 100 MG capsule Take 1 capsule (100 mg total) by mouth at bedtime. 01/15/21 02/14/21  Camillia Herter, NP  sucralfate (CARAFATE) 1 g tablet Take 1 tablet (1 g total) by mouth 4 (four) times daily -  with meals and at bedtime for 7 days. 10/25/21 11/01/21  Jeanell Sparrow, DO  pantoprazole (PROTONIX) 20 MG tablet Take 1 tablet (20 mg total) by mouth daily. 05/26/17 06/27/20  Malvin Johns, MD    Family History Family History  Problem Relation Age of Onset   Cancer Mother    Diabetes Mother    Hypertension Mother     Social History Social History   Tobacco Use   Smoking status: Former    Packs/day: 0.50    Years: 2.00    Total pack years: 1.00    Types: Cigarettes    Quit date: 07/22/2011    Years since quitting: 10.8    Passive exposure: Past   Smokeless tobacco: Never  Vaping Use   Vaping Use: Never used  Substance Use Topics   Alcohol use: Yes    Alcohol/week: 0.0 standard drinks of alcohol    Comment: occ    Drug use: No     Allergies   Diclofenac, Vancomycin, and Adhesive [tape]   Review of Systems Review of Systems  Constitutional:        Subjective  HENT:  Positive for congestion and sore throat.   Respiratory:  Positive for cough.   Gastrointestinal:  Positive for diarrhea and vomiting.  Neurological:  Positive for headaches.     Physical Exam Triage Vital Signs ED Triage Vitals  Enc Vitals Group     BP 06/02/22 1726 122/68     Pulse Rate 06/02/22 1726 99     Resp 06/02/22 1726 18     Temp 06/02/22 1726 97.8 F (36.6 C)     Temp src --  SpO2 06/02/22 1726 95 %     Weight --      Height 06/02/22 1726 5' 1"$  (1.549 m)     Head Circumference --      Peak Flow --      Pain Score 06/02/22 1725 9     Pain Loc --      Pain Edu? --      Excl. in [place]? --    No data found.  Updated Vital Signs BP 122/68 (BP Location: Right Arm)   Pulse 99   Temp 97.8 F (36.6 C)   Resp 18   Ht 5' 1"$  (1.549 m)   SpO2 95%   BMI 57.69 kg/m   Visual Acuity Right Eye Distance:   Left Eye Distance:   Bilateral Distance:    Right Eye Near:   Left Eye Near:    Bilateral Near:     Physical Exam Vitals and nursing note reviewed.  Constitutional:      General: She is not in acute distress.    Appearance: She is well-developed. She is not ill-appearing.  HENT:     Head: Normocephalic and atraumatic.     Right Ear: Tympanic membrane and ear canal normal.     Left Ear: Tympanic membrane and ear canal normal.     Nose: Congestion present.     Mouth/Throat:     Mouth: Mucous membranes are moist.     Pharynx: Oropharynx is clear. Uvula midline. Posterior oropharyngeal erythema present.     Tonsils: No tonsillar exudate or tonsillar abscesses.  Eyes:     Conjunctiva/sclera: Conjunctivae normal.     Pupils: Pupils are equal, round, and reactive to light.  Cardiovascular:     Rate and Rhythm: Normal rate and regular rhythm.     Heart sounds: Normal heart sounds.  Pulmonary:      Effort: Pulmonary effort is normal.     Breath sounds: Normal breath sounds.  Musculoskeletal:     Cervical back: Normal range of motion and neck supple.  Lymphadenopathy:     Cervical: No cervical adenopathy.  Skin:    General: Skin is warm and dry.  Neurological:     General: No focal deficit present.     Mental Status: She is alert and oriented to person, place, and time.  Psychiatric:        Mood and Affect: Mood normal.        Behavior: Behavior normal.      UC Treatments / Results  Labs (all labs ordered are listed, but only abnormal results are displayed) Labs Reviewed - No data to display  EKG   Radiology DG Chest 2 View  Result Date: 06/02/2022 CLINICAL DATA:  Cough EXAM: CHEST - 2 VIEW COMPARISON:  02/17/2022 FINDINGS: The heart size and mediastinal contours are within normal limits. Both lungs are clear. The visualized skeletal structures are unremarkable. IMPRESSION: No active cardiopulmonary disease. Electronically Signed   By: Donavan Foil M.D.   On: 06/02/2022 17:46    Procedures Procedures (including critical care time)  Medications Ordered in UC Medications - No data to display  Initial Impression / Assessment and Plan / UC Course  I have reviewed the triage vital signs and the nursing notes.  Pertinent labs & imaging results that were available during my care of the patient were reviewed by me and considered in my medical decision making (see chart for details).     Reviewed exam and symptoms with patient.  No red flags  on exam.  Chest x-ray unremarkable Discussed viral illness and continued symptomatic treatment Will start Zofran for reported nausea vomiting Continue previously prescribed cough medicine, decongestants, nasal sprays as needed Rest and fluids PCP follow-up in 2 to 3 days for recheck ER precautions reviewed and patient verbalized understanding Final Clinical Impressions(s) / UC Diagnoses   Final diagnoses:  Acute cough  Viral  illness  Nausea and vomiting, unspecified vomiting type     Discharge Instructions      Zofran as needed for nausea and vomiting Continue your previously prescribed cough medicine and nasal sprays Rest and fluids Follow-up with your PCP 2 to 3 days for recheck Please go to the ER if you have any worsening symptoms     ED Prescriptions     Medication Sig Dispense Auth. Provider   ondansetron (ZOFRAN-ODT) 4 MG disintegrating tablet Take 1 tablet (4 mg total) by mouth every 8 (eight) hours as needed for nausea or vomiting. 20 tablet Melynda Ripple, NP      PDMP not reviewed this encounter.   Melynda Ripple, NP 06/02/22 [phone]

## 2022-06-02 NOTE — ED Triage Notes (Signed)
Pt states that she has a headache, cough, chest congestion, diarrhea and nasal congestion. X3 days

## 2022-06-02 NOTE — Discharge Instructions (Signed)
Zofran as needed for nausea and vomiting Continue your previously prescribed cough medicine and nasal sprays Rest and fluids Follow-up with your PCP 2 to 3 days for recheck Please go to the ER if you have any worsening symptoms

## 2022-06-26 ENCOUNTER — Ambulatory Visit
Admission: EM | Admit: 2022-06-26 | Discharge: 2022-06-26 | Disposition: A | Discharge status: Home / Self Care | Payer: BLUE CROSS/BLUE SHIELD

## 2022-06-26 ENCOUNTER — Emergency Department (HOSPITAL_BASED_OUTPATIENT_CLINIC_OR_DEPARTMENT_OTHER): Payer: BLUE CROSS/BLUE SHIELD

## 2022-06-26 ENCOUNTER — Other Ambulatory Visit: Payer: Self-pay

## 2022-06-26 ENCOUNTER — Emergency Department (HOSPITAL_BASED_OUTPATIENT_CLINIC_OR_DEPARTMENT_OTHER)
Admission: EM | Admit: 2022-06-26 | Discharge: 2022-06-26 | Disposition: A | Discharge status: Home / Self Care | Payer: BLUE CROSS/BLUE SHIELD | Attending: Emergency Medicine | Admitting: Emergency Medicine

## 2022-06-26 DIAGNOSIS — M7122 Synovial cyst of popliteal space [Baker], left knee: Secondary | ICD-10-CM | POA: Diagnosis not present

## 2022-06-26 DIAGNOSIS — Z794 Long term (current) use of insulin: Secondary | ICD-10-CM | POA: Insufficient documentation

## 2022-06-26 DIAGNOSIS — M79662 Pain in left lower leg: Secondary | ICD-10-CM

## 2022-06-26 DIAGNOSIS — R600 Localized edema: Secondary | ICD-10-CM | POA: Insufficient documentation

## 2022-06-26 DIAGNOSIS — M25562 Pain in left knee: Secondary | ICD-10-CM | POA: Diagnosis present

## 2022-06-26 LAB — BASIC METABOLIC PANEL
Anion gap: 8 (ref 5–15)
BUN: 9 mg/dL (ref 6–20)
CO2: 27 mmol/L (ref 22–32)
Calcium: 8.7 mg/dL — ABNORMAL LOW (ref 8.9–10.3)
Chloride: 100 mmol/L (ref 98–111)
Creatinine, Ser: 0.66 mg/dL (ref 0.44–1.00)
GFR, Estimated: >60 mL/min (ref >60)
Glucose, Bld: 363 mg/dL — ABNORMAL HIGH (ref 70–99)
Potassium: 4.3 mmol/L (ref 3.5–5.1)
Sodium: 135 mmol/L (ref 135–145)

## 2022-06-26 LAB — CBC WITH DIFFERENTIAL/PLATELET
Abs Immature Granulocytes: 0.03 10*3/uL (ref 0.00–0.07)
Basophils Absolute: 0 10*3/uL (ref 0.0–0.1)
Basophils Relative: 0 %
Eosinophils Absolute: 0.2 10*3/uL (ref 0.0–0.5)
Eosinophils Relative: 2 %
HCT: 40.1 % (ref 36.0–46.0)
Hemoglobin: 13 g/dL (ref 12.0–15.0)
Immature Granulocytes: 0 %
Lymphocytes Relative: 25 %
Lymphs Abs: 2.8 10*3/uL (ref 0.7–4.0)
MCH: 27.5 pg (ref 26.0–34.0)
MCHC: 32.4 g/dL (ref 30.0–36.0)
MCV: 85 fL (ref 80.0–100.0)
Monocytes Absolute: 0.7 10*3/uL (ref 0.1–1.0)
Monocytes Relative: 6 %
Neutro Abs: 7.5 10*3/uL (ref 1.7–7.7)
Neutrophils Relative %: 67 %
Platelets: 338 10*3/uL (ref 150–400)
RBC: 4.72 MIL/uL (ref 3.87–5.11)
RDW: 13.5 % (ref 11.5–15.5)
WBC: 11.2 10*3/uL — ABNORMAL HIGH (ref 4.0–10.5)
nRBC: 0 % (ref 0.0–0.2)

## 2022-06-26 NOTE — Discharge Instructions (Signed)
You may take Tylenol every 6 hours as needed for pain.  You may also wear a knee brace if you wish.  Please follow-up with your primary care provider about your recent symptoms and ER visit.  You may also speak with your provider about possible knee x-ray as you might have osteoarthritis which may be causing the Baker's cyst.  If symptoms worsen please return to ER.

## 2022-06-26 NOTE — Discharge Instructions (Signed)
Please go to the ER to rule out a blood clot in your leg

## 2022-06-26 NOTE — ED Triage Notes (Signed)
Pt arrives with c/o a knot behind left knee that started a few days ago. Per pt, it is painful and that it radiates into her calf.

## 2022-06-26 NOTE — ED Triage Notes (Signed)
Pt presents with c/o a knot behind her left knee. States she could hardly bend it last night and states the pain started 1 week ago, noticed the knot 2 days ago.

## 2022-06-26 NOTE — ED Provider Notes (Signed)
[facility]    CSN: [id number] Arrival date & time: 06/26/22  1428      History   Chief Complaint Chief Complaint  Patient presents with   Leg Problem    HPI [name] is a 52 y.o. female presents for evaluation of pain behind her left knee and calf.  Patient reports began 2 days ago and has been worsening.  She feels like the area is swollen.  Pain is worse with walking.  Denies any redness or warmth.  No injury to the area.  No numbness or tingling.  No chest pain or shortness of breath.  Denies any history of DVT or clotting disorders.  No recent cancer treatment or long distance travel.  Denies history of Baker's cyst.  No other concerns at this time.  HPI  Past Medical History:  Diagnosis Date   Cellulitis 07/2011   Diabetes mellitus    dx 2016   GERD (gastroesophageal reflux disease)     Patient Active Problem List   Diagnosis Date Noted   Left lumbar radiculopathy 02/22/2019   Trigger finger of left thumb 01/07/2019   Cellulitis and abscess of right leg 01/02/2018   Bilateral carpal tunnel syndrome 07/06/2016   Left elbow pain 02/17/2016   Uncontrolled type 2 diabetes mellitus with hyperglycemia ([place]) 10/29/2015   Uncontrolled type 2 diabetes mellitus without complication, with long-term current use of insulin 10/29/2015   Injury of right hand 05/19/2015   Cervical disc disorder with radiculopathy, unspecified cervical region 10/10/2013   Diabetes ([place]) 04/07/2013   Pyelonephritis 04/06/2013   RLQ abdominal pain 04/06/2013   Fever 09/07/2012   Headache(784.0) 09/07/2012   Cellulitis 08/05/2011   Peripheral edema 08/05/2011   Gastroesophageal reflux disease 08/05/2011   BMI 50.0-59.9, adult ([place]) 08/05/2011    Past Surgical History:  Procedure Laterality Date   ABLATION     CARPAL TUNNEL RELEASE Left 09/13/2016   Procedure: LEFT CARPAL TUNNEL RELEASE;  Surgeon: Daryll Brod, MD;  Location: [facility];  Service: Orthopedics;  Laterality: Left;    CHOLECYSTECTOMY     KNEE ARTHROSCOPY     LAPAROSCOPIC TUBAL LIGATION  04/29/2011   Procedure: LAPAROSCOPIC TUBAL LIGATION;  Surgeon: Luz Lex, MD;  Location: [facility];  Service: Gynecology;  Laterality: Bilateral;  with Filshie Clips   ROTATOR CUFF REPAIR     TUBAL LIGATION      OB History   No obstetric history on file.      Home Medications    Prior to Admission medications   Medication Sig Start Date End Date Taking? Authorizing Provider  acetaminophen (TYLENOL) 325 MG tablet Take 2 tablets (650 mg total) by mouth every 6 (six) hours as needed for moderate pain. 05/30/22   Jaynee Eagles, PA-C  amoxicillin-clavulanate (AUGMENTIN) 875-125 MG tablet Take 1 tablet by mouth every 12 (twelve) hours. 01/21/22   Deno Etienne, DO  Blood Glucose Monitoring Suppl (TRUE METRIX METER) w/Device KIT Use as directed 11/10/20   Camillia Herter, NP  gabapentin (NEURONTIN) 100 MG capsule Take 1 capsule (100 mg total) by mouth at bedtime. 01/15/21 02/14/21  Camillia Herter, NP  glimepiride (AMARYL) 1 MG tablet TAKE 1 TABLET(1 MG) BY MOUTH TWICE DAILY WITH A MEAL 09/07/21   Camillia Herter, NP  glucose blood (TRUE METRIX BLOOD GLUCOSE TEST) test strip Use as instructed 11/10/20   Camillia Herter, NP  ibuprofen (ADVIL) 600 MG tablet Take 1 tablet (600 mg total) by mouth every 8 (eight) hours  as needed (pain). 11/16/21   Barrett Henle, MD  Insulin Pen Needle (PEN NEEDLES) 31G X 8 MM MISC UAD 03/01/21   Durene Fruits J, NP  ipratropium (ATROVENT) 0.03 % nasal spray Place 2 sprays into both nostrils 2 (two) times daily. 05/30/22   Jaynee Eagles, PA-C  LANTUS SOLOSTAR 100 UNIT/ML Solostar Pen ADMINISTER 13 UNITS UNDER THE SKIN AT BEDTIME 07/06/21   Camillia Herter, NP  levocetirizine (XYZAL) 5 MG tablet Take 1 tablet (5 mg total) by mouth every evening. 05/30/22   Jaynee Eagles, PA-C  metFORMIN (GLUCOPHAGE-XR) 500 MG 24 hr tablet TAKE 2 TABLETS(1000 MG) BY MOUTH TWICE DAILY WITH A MEAL 09/07/21   Camillia Herter, NP   omeprazole (PRILOSEC) 40 MG capsule Take 40 mg by mouth daily.    [provider]  ondansetron (ZOFRAN-ODT) 4 MG disintegrating tablet Take 1 tablet (4 mg total) by mouth every 8 (eight) hours as needed for nausea or vomiting. 06/02/22   Melynda Ripple, NP  promethazine-dextromethorphan (PROMETHAZINE-DM) 6.25-15 MG/5ML syrup Take 5 mLs by mouth 3 (three) times daily as needed for cough. 05/30/22   Jaynee Eagles, PA-C  pseudoephedrine (SUDAFED) 30 MG tablet Take 1 tablet (30 mg total) by mouth every 8 (eight) hours as needed for congestion. 05/30/22   Jaynee Eagles, PA-C  sucralfate (CARAFATE) 1 g tablet Take 1 tablet (1 g total) by mouth 4 (four) times daily -  with meals and at bedtime for 7 days. 10/25/21 11/01/21  Jeanell Sparrow, DO  TRUEplus Lancets 28G MISC Use as directed 11/10/20   Camillia Herter, NP  pantoprazole (PROTONIX) 20 MG tablet Take 1 tablet (20 mg total) by mouth daily. 05/26/17 06/27/20  Malvin Johns, MD    Family History Family History  Problem Relation Age of Onset   Cancer Mother    Diabetes Mother    Hypertension Mother     Social History Social History   Tobacco Use   Smoking status: Former    Packs/day: 0.50    Years: 2.00    Total pack years: 1.00    Types: Cigarettes    Quit date: 07/22/2011    Years since quitting: 10.9    Passive exposure: Past   Smokeless tobacco: Never  Vaping Use   Vaping Use: Never used  Substance Use Topics   Alcohol use: Yes    Alcohol/week: 0.0 standard drinks of alcohol    Comment: occ   Drug use: No     Allergies   Diclofenac, Vancomycin, and Adhesive [tape]   Review of Systems Review of Systems  Skin:        Pain and swelling behind left knee and calf     Physical Exam Triage Vital Signs ED Triage Vitals  Enc Vitals Group     BP 06/26/22 1507 111/63     Pulse Rate 06/26/22 1507 93     Resp 06/26/22 1507 20     Temp 06/26/22 1507 97.8 F (36.6 C)     Temp Source 06/26/22 1507 Oral     SpO2 06/26/22 1507  95 %     Weight --      Height --      Head Circumference --      Peak Flow --      Pain Score 06/26/22 1506 8     Pain Loc --      Pain Edu? --      Excl. in [place]? --    No data  found.  Updated Vital Signs BP 111/63 (BP Location: Right Arm)   Pulse 93   Temp 97.8 F (36.6 C) (Oral)   Resp 20   SpO2 95%   Visual Acuity Right Eye Distance:   Left Eye Distance:   Bilateral Distance:    Right Eye Near:   Left Eye Near:    Bilateral Near:     Physical Exam Vitals and nursing note reviewed.  Constitutional:      Appearance: Normal appearance. She is not ill-appearing.  HENT:     Head: Normocephalic and atraumatic.  Eyes:     Pupils: Pupils are equal, round, and reactive to light.  Cardiovascular:     Rate and Rhythm: Normal rate.  Pulmonary:     Effort: Pulmonary effort is normal.  Musculoskeletal:       Legs:  Skin:    General: Skin is warm and dry.  Neurological:     General: No focal deficit present.     Mental Status: She is alert and oriented to person, place, and time.  Psychiatric:        Mood and Affect: Mood normal.        Behavior: Behavior normal.    Clinical feature Score     Active cancer (treatment ongoing or within the previous six months or palliative) 0  Paralysis, paresis, or recent plaster immobilization of the lower extremities 0  Recently bedridden for more than three days or major surgery, within four weeks                                                    0  Localized tenderness along the distribution of the deep venous system 1  Entire leg swollen 0  Calf swelling by more than 3 cm when compared to the asymptomatic leg (measured below tibial tuberosity) 0  Pitting edema (greater in the symptomatic leg) 0  Collateral superficial veins (nonvaricose) 1  Alternative diagnosis as likely or more likely than that of deep venous thrombosis -2                                                                                                                                                              Total Score 2     Interpretation    High probability  3 or greater  Moderate probability 1 or 2  Low probability 0 or less  Modification   This clinical model has been modified to take one other clinical feature into account: a previously documented deep vein thrombosis (DVT) is given the score of 1. Using this modified scoring system, DVT is either likely or  unlikely, as follows:  DVT likely 2 or greater   DVT unlikely 1 or less     UC Treatments / Results  Labs (all labs ordered are listed, but only abnormal results are displayed) Labs Reviewed - No data to display  EKG   Radiology No results found.  Procedures Procedures (including critical care time)  Medications Ordered in UC Medications - No data to display  Initial Impression / Assessment and Plan / UC Course  I have reviewed the triage vital signs and the nursing notes.  Pertinent labs & imaging results that were available during my care of the patient were reviewed by me and considered in my medical decision making (see chart for details).     Reviewed exam and symptoms with patient.  Discussed Baker's cyst versus DVT.  Patient does have pain along the posterior calf and does have risk factors for DVT.  Advised I am unable to rule this out in the setting.  Given this advised ER evaluation to rule this out.  She is in agreement with plan will go POV with her husband driving to the emergency room. Final Clinical Impressions(s) / UC Diagnoses   Final diagnoses:  Pain of left calf     Discharge Instructions      Please go to the ER to rule out a blood clot in your leg   ED Prescriptions   None    PDMP not reviewed this encounter.   Melynda Ripple, NP 06/26/22 [phone]

## 2022-06-26 NOTE — ED Provider Notes (Signed)
[facility] Provider Note   CSN: [id number] Arrival date & time: 06/26/22  1553     History  Chief Complaint  Patient presents with   Leg Pain    [name] is a 52 y.o. female history of diabetes presented with 2 weeks of posterior left knee pain.  Patient states pain is gone from the back of her knee down to her calf.  Patient went to urgent care today and was sent to the ER to be evaluated for DVT.  Patient has had no recent travel or hospitalizations, estrogen use, hemoptysis, recent surgery, history of blood clots.  Patient denies chest pain, shortness of breath, Donnell pain, nausea/vomiting, syncope, changes in sensation/motor skills, weakness, overlying skin color changes  Home Medications Prior to Admission medications   Medication Sig Start Date End Date Taking? Authorizing Provider  acetaminophen (TYLENOL) 325 MG tablet Take 2 tablets (650 mg total) by mouth every 6 (six) hours as needed for moderate pain. 05/30/22   Jaynee Eagles, PA-C  amoxicillin-clavulanate (AUGMENTIN) 875-125 MG tablet Take 1 tablet by mouth every 12 (twelve) hours. 01/21/22   Deno Etienne, DO  Blood Glucose Monitoring Suppl (TRUE METRIX METER) w/Device KIT Use as directed 11/10/20   Camillia Herter, NP  gabapentin (NEURONTIN) 100 MG capsule Take 1 capsule (100 mg total) by mouth at bedtime. 01/15/21 02/14/21  Camillia Herter, NP  glimepiride (AMARYL) 1 MG tablet TAKE 1 TABLET(1 MG) BY MOUTH TWICE DAILY WITH A MEAL 09/07/21   Camillia Herter, NP  glucose blood (TRUE METRIX BLOOD GLUCOSE TEST) test strip Use as instructed 11/10/20   Camillia Herter, NP  ibuprofen (ADVIL) 600 MG tablet Take 1 tablet (600 mg total) by mouth every 8 (eight) hours as needed (pain). 11/16/21   Barrett Henle, MD  Insulin Pen Needle (PEN NEEDLES) 31G X 8 MM MISC UAD 03/01/21   Durene Fruits J, NP  ipratropium (ATROVENT) 0.03 % nasal spray Place 2 sprays into both nostrils 2 (two)  times daily. 05/30/22   Jaynee Eagles, PA-C  LANTUS SOLOSTAR 100 UNIT/ML Solostar Pen ADMINISTER 13 UNITS UNDER THE SKIN AT BEDTIME 07/06/21   Camillia Herter, NP  levocetirizine (XYZAL) 5 MG tablet Take 1 tablet (5 mg total) by mouth every evening. 05/30/22   Jaynee Eagles, PA-C  metFORMIN (GLUCOPHAGE-XR) 500 MG 24 hr tablet TAKE 2 TABLETS(1000 MG) BY MOUTH TWICE DAILY WITH A MEAL 09/07/21   Camillia Herter, NP  omeprazole (PRILOSEC) 40 MG capsule Take 40 mg by mouth daily.    [provider]  ondansetron (ZOFRAN-ODT) 4 MG disintegrating tablet Take 1 tablet (4 mg total) by mouth every 8 (eight) hours as needed for nausea or vomiting. 06/02/22   Melynda Ripple, NP  promethazine-dextromethorphan (PROMETHAZINE-DM) 6.25-15 MG/5ML syrup Take 5 mLs by mouth 3 (three) times daily as needed for cough. 05/30/22   Jaynee Eagles, PA-C  pseudoephedrine (SUDAFED) 30 MG tablet Take 1 tablet (30 mg total) by mouth every 8 (eight) hours as needed for congestion. 05/30/22   Jaynee Eagles, PA-C  sucralfate (CARAFATE) 1 g tablet Take 1 tablet (1 g total) by mouth 4 (four) times daily -  with meals and at bedtime for 7 days. 10/25/21 11/01/21  Jeanell Sparrow, DO  TRUEplus Lancets 28G MISC Use as directed 11/10/20   Camillia Herter, NP  pantoprazole (PROTONIX) 20 MG tablet Take 1 tablet (20 mg total) by mouth daily. 05/26/17 06/27/20  Malvin Johns, MD      Allergies    Diclofenac, Vancomycin, and Adhesive [tape]    Review of Systems   Review of Systems See HPI Physical Exam Updated Vital Signs BP 118/77 (BP Location: Left Arm)   Pulse 96   Temp 98 F (36.7 C) (Oral)   Resp 18   Wt (!) 138.3 kg   SpO2 100%   BMI 57.63 kg/m  Physical Exam Constitutional:      General: She is not in acute distress. Musculoskeletal:        General: No swelling. Normal range of motion.     Right lower leg: Edema present.     Left lower leg: Edema present.     Comments: 5 out of 5 bilateral plantar flexion/dorsiflexion Full active  range of motion in toes Tender to palpation L posterior knee and left calf No step-off/crepitus/abnormalities palpated  Skin:    General: Skin is warm and dry.     Comments: No overlying skin color changes No area of fluctuance noted Area is not warmth to the touch  Neurological:     General: No focal deficit present.     Mental Status: She is alert and oriented to person, place, and time.     Comments: Sensation intact distally     ED Results / Procedures / Treatments   Labs (all labs ordered are listed, but only abnormal results are displayed) Labs Reviewed  CBC WITH DIFFERENTIAL/PLATELET - Abnormal; Notable for the following components:      Result Value   WBC 11.2 (*)    All other components within normal limits  BASIC METABOLIC PANEL - Abnormal; Notable for the following components:   Glucose, Bld 363 (*)    Calcium 8.7 (*)    All other components within normal limits    EKG None  Radiology US Venous Img Lower Unilateral Left  Result Date: 06/26/2022 CLINICAL DATA:  Left popliteal fossa pain. EXAM: LEFT LOWER EXTREMITY VENOUS DOPPLER ULTRASOUND TECHNIQUE: Gray-scale sonography with compression, as well as color and duplex ultrasound, were performed to evaluate the deep venous system(s) from the level of the common femoral vein through the popliteal and proximal calf veins. COMPARISON:  None Available. FINDINGS: VENOUS Normal compressibility of the common femoral, superficial femoral, and popliteal veins, as well as the visualized calf veins. Visualized portions of profunda femoral vein and great saphenous vein unremarkable. No filling defects to suggest DVT on grayscale or color Doppler imaging. Doppler waveforms show normal direction of venous flow, normal respiratory plasticity and response to augmentation. Limited views of the contralateral common femoral vein are unremarkable. OTHER 17 x 10 x 11 mm complex fluid collection identified left popliteal fossa. Limitations: none  IMPRESSION: 1. No evidence for left lower extremity DVT. 2. 17 x 10 x 11 mm complex fluid collection left popliteal fossa. Imaging features suggest the presence of a Baker cyst. Electronically Signed   By: Misty Stanley M.D.   On: 06/26/2022 17:00    Procedures Procedures    Medications Ordered in ED Medications - No data to display  ED Course/ Medical Decision Making/ A&P                             Medical Decision Making Amount and/or Complexity of Data Reviewed Labs: ordered.   Leo Grosser 52 y.o. presented today for pain behind left knee. Working DDx that I considered at this time includes, but not  limited to, DVT, PE, cellulitis, abscess, Baker's cyst, ischemic limb.  R/o DDx: DVT: Ultrasound negative PE: Wells score of 0 and no respiratory symptoms Abscess: No area of fluctuance or overlying skin color changes noted Cellulitis: No overlying skin color changes noted Ischemic limb: Patient had good pulses, sensation, motor skills  Review of prior external notes: 06/26/22 UC note  Unique Tests and My Interpretation:  CBC with differential: Slight leukocytosis 11.2 BMP: Elevated glucose 363  Discussion with Independent Historian: Husband  Discussion of Management of Tests: None  Risk: Low:  - based on diagnostic testing/clinical impression and treatment plan  Risk Stratification Score: Wells DVT: 2 contralateral superficial veins, tenderness to a patient along deep venous system Wells PE: 0   Plan: Patient presented for pain behind L knee. On exam patient was no acute distress and had stable vitals.  On exam patient was tender posterior left knee fossa and in her calf.  Patient had good pulses, sensation, motor skills distally with no overlying skin color changes.  Ultrasound was ordered and was positive for Baker's cyst and no blood clots.  Patient had elevated glucose on her metabolic panel and I will speak with her about watching her sugars and taking her  medication following up with her primary care provider about that but it appears going through previous results this is on par for the course for her.  I encouraged patient to continue taking Tylenol for her symptoms and following up with her primary care provider for this Baker's cyst as well.  Patient was given return precautions. Patient stable for discharge at this time.  Patient verbalized understanding of plan.         Final Clinical Impression(s) / ED Diagnoses Final diagnoses:  Synovial cyst of left popliteal space    Rx / DC Orders ED Discharge Orders     None         Elvina Sidle 06/26/22 1931    Lorelle Gibbs, DO 06/26/22 2123

## 2022-06-26 NOTE — ED Notes (Signed)
Discharge paperwork reviewed entirely with patient, including Rx's and follow up care. Pain was under control. Pt verbalized understanding as well as all parties involved. No questions or concerns voiced at the time of discharge. No acute distress noted.   Pt ambulated out to PVA without incident or assistance.  

## 2022-08-22 ENCOUNTER — Ambulatory Visit
Admission: EM | Admit: 2022-08-22 | Discharge: 2022-08-22 | Disposition: A | Discharge status: Home / Self Care | Payer: BLUE CROSS/BLUE SHIELD | Attending: Urgent Care | Admitting: Urgent Care

## 2022-08-22 DIAGNOSIS — A084 Viral intestinal infection, unspecified: Secondary | ICD-10-CM | POA: Diagnosis not present

## 2022-08-22 DIAGNOSIS — R112 Nausea with vomiting, unspecified: Secondary | ICD-10-CM

## 2022-08-22 DIAGNOSIS — Z794 Long term (current) use of insulin: Secondary | ICD-10-CM

## 2022-08-22 DIAGNOSIS — E119 Type 2 diabetes mellitus without complications: Secondary | ICD-10-CM | POA: Diagnosis not present

## 2022-08-22 DIAGNOSIS — R197 Diarrhea, unspecified: Secondary | ICD-10-CM | POA: Diagnosis not present

## 2022-08-22 LAB — POCT URINALYSIS DIP (MANUAL ENTRY)
Glucose, UA: 100 mg/dL — AB
Leukocytes, UA: NEGATIVE
Nitrite, UA: NEGATIVE
Protein Ur, POC: >=300 mg/dL — AB
Spec Grav, UA: >=1.03 — AB (ref 1.010–1.025)
Urobilinogen, UA: 1 E.U./dL
pH, UA: 6 (ref 5.0–8.0)

## 2022-08-22 LAB — POCT FASTING CBG KUC MANUAL ENTRY: POCT Glucose (KUC): 307 mg/dL — AB (ref 70–99)

## 2022-08-22 MED ORDER — LOPERAMIDE HCL 2 MG PO CAPS: 2.0000 mg | ORAL_CAPSULE | Freq: Two times a day (BID) | ORAL | 0 refills | Status: DC | PRN

## 2022-08-22 MED ORDER — ONDANSETRON HCL 4 MG/2ML IJ SOLN
4.0000 mg | Freq: Once | INTRAMUSCULAR | Status: AC
  Administered 2022-08-22: 4 mg via INTRAMUSCULAR

## 2022-08-22 MED ORDER — ONDANSETRON 8 MG PO TBDP: 8.0000 mg | ORAL_TABLET | Freq: Three times a day (TID) | ORAL | 0 refills | Status: DC | PRN

## 2022-08-22 NOTE — ED Provider Notes (Signed)
[facility]  Note:  This document was prepared using Conservation officer, historic buildings and may include unintentional dictation errors.  MRN: [id number] DOB: 09-14-1970  Subjective:   [name] is a 52 y.o. female presenting for 2-day history of acute onset nausea, vomiting, diarrhea, abdominal pain, subjective fever, chills.  No bloody stools.  No hematemesis.  No fever, recent antibiotic use, hospitalizations or long distance travel.  Has not eaten raw foods, drank unfiltered water.  No history of GI disorders including Crohn's, IBS, ulcerative colitis.  Has type 2 diabetes and is treated with insulin.  Does not know what her blood sugar levels are.  Has been able to tolerate very little water over the past couple of days.  Has not eaten foods.  Has a history of cholecystectomy.  No history of appendectomy.  No current facility-administered medications for this encounter.  Current Outpatient Medications:    acetaminophen (TYLENOL) 325 MG tablet, Take 2 tablets (650 mg total) by mouth every 6 (six) hours as needed for moderate pain., Disp: 30 tablet, Rfl: 0   amoxicillin-clavulanate (AUGMENTIN) 875-125 MG tablet, Take 1 tablet by mouth every 12 (twelve) hours., Disp: 14 tablet, Rfl: 0   Blood Glucose Monitoring Suppl (TRUE METRIX METER) w/Device KIT, Use as directed, Disp: 1 kit, Rfl: 0   gabapentin (NEURONTIN) 100 MG capsule, Take 1 capsule (100 mg total) by mouth at bedtime., Disp: 30 capsule, Rfl: 0   glimepiride (AMARYL) 1 MG tablet, TAKE 1 TABLET(1 MG) BY MOUTH TWICE DAILY WITH A MEAL, Disp: 180 tablet, Rfl: 0   glucose blood (TRUE METRIX BLOOD GLUCOSE TEST) test strip, Use as instructed, Disp: 100 each, Rfl: 12   ibuprofen (ADVIL) 600 MG tablet, Take 1 tablet (600 mg total) by mouth every 8 (eight) hours as needed (pain)., Disp: 15 tablet, Rfl: 0   Insulin Pen Needle (PEN NEEDLES) 31G X 8 MM MISC, UAD, Disp: 100 each, Rfl: 0   ipratropium (ATROVENT) 0.03  % nasal spray, Place 2 sprays into both nostrils 2 (two) times daily., Disp: 30 mL, Rfl: 0   LANTUS SOLOSTAR 100 UNIT/ML Solostar Pen, ADMINISTER 13 UNITS UNDER THE SKIN AT BEDTIME, Disp: 12 mL, Rfl: 0   levocetirizine (XYZAL) 5 MG tablet, Take 1 tablet (5 mg total) by mouth every evening., Disp: 30 tablet, Rfl: 0   metFORMIN (GLUCOPHAGE-XR) 500 MG 24 hr tablet, TAKE 2 TABLETS(1000 MG) BY MOUTH TWICE DAILY WITH A MEAL, Disp: 360 tablet, Rfl: 0   omeprazole (PRILOSEC) 40 MG capsule, Take 40 mg by mouth daily., Disp: , Rfl:    ondansetron (ZOFRAN-ODT) 4 MG disintegrating tablet, Take 1 tablet (4 mg total) by mouth every 8 (eight) hours as needed for nausea or vomiting., Disp: 20 tablet, Rfl: 0   promethazine-dextromethorphan (PROMETHAZINE-DM) 6.25-15 MG/5ML syrup, Take 5 mLs by mouth 3 (three) times daily as needed for cough., Disp: 200 mL, Rfl: 0   pseudoephedrine (SUDAFED) 30 MG tablet, Take 1 tablet (30 mg total) by mouth every 8 (eight) hours as needed for congestion., Disp: 30 tablet, Rfl: 0   sucralfate (CARAFATE) 1 g tablet, Take 1 tablet (1 g total) by mouth 4 (four) times daily -  with meals and at bedtime for 7 days., Disp: 28 tablet, Rfl: 0   TRUEplus Lancets 28G MISC, Use as directed, Disp: 100 each, Rfl: 4   Allergies  Allergen Reactions   Diclofenac Other (See Comments)    "red man syndrome"   Vancomycin Other (See  Comments)    Red man syndrome   Adhesive [Tape] Itching and Rash    Past Medical History:  Diagnosis Date   Cellulitis 07/2011   Diabetes mellitus    dx 2016   GERD (gastroesophageal reflux disease)      Past Surgical History:  Procedure Laterality Date   ABLATION     CARPAL TUNNEL RELEASE Left 09/13/2016   Procedure: LEFT CARPAL TUNNEL RELEASE;  Surgeon: Cindee Salt, MD;  Location: [facility];  Service: Orthopedics;  Laterality: Left;   CHOLECYSTECTOMY     KNEE ARTHROSCOPY     LAPAROSCOPIC TUBAL LIGATION  04/29/2011   Procedure: LAPAROSCOPIC TUBAL LIGATION;   Surgeon: Turner Daniels, MD;  Location: [facility];  Service: Gynecology;  Laterality: Bilateral;  with Filshie Clips   ROTATOR CUFF REPAIR     TUBAL LIGATION      Family History  Problem Relation Age of Onset   Cancer Mother    Diabetes Mother    Hypertension Mother     Social History   Tobacco Use   Smoking status: Former    Packs/day: 0.50    Years: 2.00    Additional pack years: 0.00    Total pack years: 1.00    Types: Cigarettes    Quit date: 07/22/2011    Years since quitting: 11.0    Passive exposure: Past   Smokeless tobacco: Never  Vaping Use   Vaping Use: Never used  Substance Use Topics   Alcohol use: Not Currently    Comment: occ   Drug use: No    ROS   Objective:   Vitals: BP 133/79 (BP Location: Right Arm)   Pulse (!) 103   Temp 98.1 F (36.7 C) (Oral)   Resp 20   SpO2 93%   Physical Exam Constitutional:      General: She is not in acute distress.    Appearance: Normal appearance. She is well-developed. She is not ill-appearing, toxic-appearing or diaphoretic.  HENT:     Head: Normocephalic and atraumatic.     Nose: Nose normal.     Mouth/Throat:     Mouth: Mucous membranes are moist.     Pharynx: Oropharynx is clear.  Eyes:     General: No scleral icterus.       Right eye: No discharge.        Left eye: No discharge.     Extraocular Movements: Extraocular movements intact.     Conjunctiva/sclera: Conjunctivae normal.  Cardiovascular:     Rate and Rhythm: Normal rate and regular rhythm.     Heart sounds: Normal heart sounds. No murmur heard.    No friction rub. No gallop.  Pulmonary:     Effort: Pulmonary effort is normal. No respiratory distress.     Breath sounds: No stridor. No wheezing, rhonchi or rales.  Chest:     Chest wall: No tenderness.  Abdominal:     General: Bowel sounds are increased. There is no distension.     Palpations: Abdomen is soft. There is no mass.     Tenderness: There is generalized abdominal tenderness. There  is no right CVA tenderness, left CVA tenderness, guarding or rebound.  Skin:    General: Skin is warm and dry.  Neurological:     General: No focal deficit present.     Mental Status: She is alert and oriented to person, place, and time.  Psychiatric:        Mood and Affect: Mood normal.  Behavior: Behavior normal.        Thought Content: Thought content normal.        Judgment: Judgment normal.     Results for orders placed or performed during the hospital encounter of 08/22/22 (from the past 24 hour(s))  POCT urinalysis dipstick     Status: Abnormal   Collection Time: 08/22/22 11:36 AM  Result Value Ref Range   Color, UA other (A) yellow   Clarity, UA clear clear   Glucose, UA =100 (A) negative mg/dL   Bilirubin, UA small (A) negative   Ketones, POC UA trace (5) (A) negative mg/dL   Spec Grav, UA >=2.130 (A) 1.010 - 1.025   Blood, UA trace-intact (A) negative   pH, UA 6.0 5.0 - 8.0   Protein Ur, POC >=300 (A) negative mg/dL   Urobilinogen, UA 1.0 0.2 or 1.0 E.U./dL   Nitrite, UA Negative Negative   Leukocytes, UA Negative Negative  POCT CBG (manual entry)     Status: Abnormal   Collection Time: 08/22/22 11:39 AM  Result Value Ref Range   POCT Glucose ([facility]) 307 (A) 70 - 99 mg/dL    Assessment and Plan :   PDMP not reviewed this encounter.  1. Viral gastroenteritis   2. Nausea vomiting and diarrhea   3. Type 2 diabetes mellitus treated with insulin (HCC)    Will defer ER visit for workup of an acute abdomen.  She has generalized abdominal tenderness but no guarding, distention, rebound tenderness.  No peritoneal signs.  Will manage for suspected viral gastroenteritis with supportive care.  Recommended patient hydrate well, eat light meals and maintain electrolytes.  Will use Zofran and Imodium for nausea, vomiting and diarrhea. Counseled patient on potential for adverse effects with medications prescribed/recommended today, ER and return-to-clinic precautions  discussed, patient verbalized understanding.    Wallis Bamberg, [place] 08/22/22 [id number]

## 2022-08-22 NOTE — Discharge Instructions (Signed)

## 2022-08-22 NOTE — ED Triage Notes (Signed)
Pt c/o generalized abd pain, n/v, chills started last night-NAD-steady gait

## 2022-09-15 ENCOUNTER — Ambulatory Visit
Admission: EM | Admit: 2022-09-15 | Discharge: 2022-09-15 | Disposition: A | Discharge status: Home / Self Care | Payer: BLUE CROSS/BLUE SHIELD | Attending: Family Medicine | Admitting: Family Medicine

## 2022-09-15 DIAGNOSIS — L03115 Cellulitis of right lower limb: Secondary | ICD-10-CM

## 2022-09-15 MED ORDER — AMOXICILLIN-POT CLAVULANATE 875-125 MG PO TABS: 1.0000 | ORAL_TABLET | Freq: Two times a day (BID) | ORAL | 0 refills | Status: AC

## 2022-09-15 NOTE — ED Triage Notes (Signed)
Pt c/o right leg swelling x 4 days. Pt reports leg is warm to the touch.

## 2022-09-15 NOTE — ED Provider Notes (Signed)
[facility]    CSN: [id number] Arrival date & time: 09/15/22  1754      History   Chief Complaint Chief Complaint  Patient presents with   Leg Swelling    HPI [name] is a 52 y.o. female.   HPI Here for redness and warmth pain in her right anterior lower leg.  It is also felt swollen there.  No vomiting but possibly some subjective fever.  She does have diabetes.  When asked today how that is doing, she states "oh that is probably high today.  I have not "done my stuff"    Past Medical History:  Diagnosis Date   Cellulitis 07/2011   Diabetes mellitus    dx 2016   GERD (gastroesophageal reflux disease)     Patient Active Problem List   Diagnosis Date Noted   Left lumbar radiculopathy 02/22/2019   Trigger finger of left thumb 01/07/2019   Cellulitis and abscess of right leg 01/02/2018   Bilateral carpal tunnel syndrome 07/06/2016   Left elbow pain 02/17/2016   Uncontrolled type 2 diabetes mellitus with hyperglycemia (HCC) 10/29/2015   Uncontrolled type 2 diabetes mellitus without complication, with long-term current use of insulin 10/29/2015   Injury of right hand 05/19/2015   Cervical disc disorder with radiculopathy, unspecified cervical region 10/10/2013   Diabetes (HCC) 04/07/2013   Pyelonephritis 04/06/2013   RLQ abdominal pain 04/06/2013   Fever 09/07/2012   Headache(784.0) 09/07/2012   Cellulitis 08/05/2011   Peripheral edema 08/05/2011   Gastroesophageal reflux disease 08/05/2011   BMI 50.0-59.9, adult (HCC) 08/05/2011    Past Surgical History:  Procedure Laterality Date   ABLATION     CARPAL TUNNEL RELEASE Left 09/13/2016   Procedure: LEFT CARPAL TUNNEL RELEASE;  Surgeon: Cindee Salt, MD;  Location: [facility] OR;  Service: Orthopedics;  Laterality: Left;   CHOLECYSTECTOMY     KNEE ARTHROSCOPY     LAPAROSCOPIC TUBAL LIGATION  04/29/2011   Procedure: LAPAROSCOPIC TUBAL LIGATION;  Surgeon: Turner Daniels, MD;  Location: [facility];  Service:  Gynecology;  Laterality: Bilateral;  with Filshie Clips   ROTATOR CUFF REPAIR     TUBAL LIGATION      OB History   No obstetric history on file.      Home Medications    Prior to Admission medications   Medication Sig Start Date End Date Taking? Authorizing Provider  acetaminophen (TYLENOL) 325 MG tablet Take 2 tablets (650 mg total) by mouth every 6 (six) hours as needed for moderate pain. 05/30/22  Yes Wallis Bamberg, PA-C  amoxicillin-clavulanate (AUGMENTIN) 875-125 MG tablet Take 1 tablet by mouth 2 (two) times daily for 7 days. 09/15/22 09/22/22 Yes Zenia Resides, MD  Blood Glucose Monitoring Suppl (TRUE METRIX METER) w/Device KIT Use as directed 11/10/20  Yes Zonia Kief, Amy J, NP  glimepiride (AMARYL) 1 MG tablet TAKE 1 TABLET(1 MG) BY MOUTH TWICE DAILY WITH A MEAL 09/07/21  Yes Zonia Kief, Amy J, NP  glucose blood (TRUE METRIX BLOOD GLUCOSE TEST) test strip Use as instructed 11/10/20  Yes Zonia Kief, Amy J, NP  insulin NPH-regular Human (NOVOLIN 70/30) (70-30) 100 UNIT/ML injection Inject into the skin. 01/22/19  Yes [provider]  Insulin Pen Needle (PEN NEEDLES) 31G X 8 MM MISC UAD 03/01/21  Yes Zonia Kief, Amy J, NP  ipratropium (ATROVENT) 0.03 % nasal spray Place 2 sprays into both nostrils 2 (two) times daily. 05/30/22  Yes Wallis Bamberg, PA-C  LANTUS SOLOSTAR 100 UNIT/ML Solostar Pen ADMINISTER 13  UNITS UNDER THE SKIN AT BEDTIME 07/06/21  Yes Zonia Kief, Amy J, NP  levocetirizine (XYZAL) 5 MG tablet Take 1 tablet (5 mg total) by mouth every evening. 05/30/22  Yes Wallis Bamberg, PA-C  loperamide (IMODIUM) 2 MG capsule Take 1 capsule (2 mg total) by mouth 2 (two) times daily as needed for diarrhea or loose stools. 08/22/22  Yes Wallis Bamberg, PA-C  metFORMIN (GLUCOPHAGE-XR) 500 MG 24 hr tablet TAKE 2 TABLETS(1000 MG) BY MOUTH TWICE DAILY WITH A MEAL 09/07/21  Yes Zonia Kief, Amy J, NP  omeprazole (PRILOSEC) 40 MG capsule Take 40 mg by mouth daily.   Yes [provider]  ondansetron  (ZOFRAN-ODT) 8 MG disintegrating tablet Take 1 tablet (8 mg total) by mouth every 8 (eight) hours as needed for nausea or vomiting. 08/22/22  Yes Wallis Bamberg, PA-C  pseudoephedrine (SUDAFED) 30 MG tablet Take 1 tablet (30 mg total) by mouth every 8 (eight) hours as needed for congestion. 05/30/22  Yes Wallis Bamberg, PA-C  TRUEplus Lancets 28G MISC Use as directed 11/10/20  Yes Zonia Kief, Amy J, NP  gabapentin (NEURONTIN) 100 MG capsule Take 1 capsule (100 mg total) by mouth at bedtime. 01/15/21 02/14/21  Rema Fendt, NP  sucralfate (CARAFATE) 1 g tablet Take 1 tablet (1 g total) by mouth 4 (four) times daily -  with meals and at bedtime for 7 days. 10/25/21 11/01/21  Sloan Leiter, DO  pantoprazole (PROTONIX) 20 MG tablet Take 1 tablet (20 mg total) by mouth daily. 05/26/17 06/27/20  Rolan Bucco, MD    Family History Family History  Problem Relation Age of Onset   Cancer Mother    Diabetes Mother    Hypertension Mother     Social History Social History   Tobacco Use   Smoking status: Former    Packs/day: 0.50    Years: 2.00    Additional pack years: 0.00    Total pack years: 1.00    Types: Cigarettes    Quit date: 07/22/2011    Years since quitting: 11.1    Passive exposure: Past   Smokeless tobacco: Never  Vaping Use   Vaping Use: Never used  Substance Use Topics   Alcohol use: Not Currently    Comment: occ   Drug use: No     Allergies   Diclofenac, Vancomycin, and Adhesive [tape]   Review of Systems Review of Systems   Physical Exam Triage Vital Signs ED Triage Vitals [09/15/22 1825]  Enc Vitals Group     BP 114/78     Pulse Rate 93     Resp 18     Temp 98.1 F (36.7 C)     Temp Source Oral     SpO2 94 %     Weight      Height      Head Circumference      Peak Flow      Pain Score      Pain Loc      Pain Edu?      Excl. in GC?    No data found.  Updated Vital Signs BP 114/78 (BP Location: Left Arm)   Pulse 93   Temp 98.1 F (36.7 C) (Oral)   Resp  18   SpO2 94%   Visual Acuity Right Eye Distance:   Left Eye Distance:   Bilateral Distance:    Right Eye Near:   Left Eye Near:    Bilateral Near:     Physical Exam Vitals reviewed.  Constitutional:      General: She is not in acute distress.    Appearance: She is not ill-appearing, toxic-appearing or diaphoretic.  Cardiovascular:     Rate and Rhythm: Normal rate and regular rhythm.     Heart sounds: No murmur heard. Pulmonary:     Effort: Pulmonary effort is normal.     Breath sounds: Normal breath sounds.  Skin:    Coloration: Skin is not pale.     Comments: There is mild erythema and some induration of the skin and diameter of about 6 cm on her right mid shin.  There is no ulceration.  There is some mild swelling of both lower extremities.   Neurological:     General: No focal deficit present.     Mental Status: She is alert and oriented to person, place, and time.  Psychiatric:        Behavior: Behavior normal.      UC Treatments / Results  Labs (all labs ordered are listed, but only abnormal results are displayed) Labs Reviewed - No data to display  EKG   Radiology No results found.  Procedures Procedures (including critical care time)  Medications Ordered in UC Medications - No data to display  Initial Impression / Assessment and Plan / UC Course  I have reviewed the triage vital signs and the nursing notes.  Pertinent labs & imaging results that were available during my care of the patient were reviewed by me and considered in my medical decision making (see chart for details).        This seems to be a cellulitis.  I think there is a low likelihood that this is anything related to the DVT.  Augmentin is sent in for the cellulitis.  I have asked her to please take your medications as prescribed and to follow-up with primary care about her diabetes.  She states she is going to have to be changing her primary care, due to a change in  insurance  She is to present to the emergency room if she worsens Final Clinical Impressions(s) / UC Diagnoses   Final diagnoses:  Cellulitis of right lower extremity     Discharge Instructions      Take amoxicillin-clavulanate 875 mg--1 tab twice daily with food for 7 days  Do warm compresses on the sore area 2-3 times a day.  If you are worsening, then please present to the emergency room     ED Prescriptions     Medication Sig Dispense Auth. Provider   amoxicillin-clavulanate (AUGMENTIN) 875-125 MG tablet Take 1 tablet by mouth 2 (two) times daily for 7 days. 14 tablet Teretha Chalupa, Janace Aris, MD      PDMP not reviewed this encounter.   Zenia Resides, MD 09/15/22 [phone]

## 2022-09-15 NOTE — Discharge Instructions (Signed)
Take amoxicillin-clavulanate 875 mg--1 tab twice daily with food for 7 days  Do warm compresses on the sore area 2-3 times a day.  If you are worsening, then please present to the emergency room

## 2022-10-01 ENCOUNTER — Encounter (HOSPITAL_BASED_OUTPATIENT_CLINIC_OR_DEPARTMENT_OTHER): Payer: Self-pay | Admitting: Emergency Medicine

## 2022-10-01 ENCOUNTER — Other Ambulatory Visit: Payer: Self-pay

## 2022-10-01 ENCOUNTER — Emergency Department (HOSPITAL_BASED_OUTPATIENT_CLINIC_OR_DEPARTMENT_OTHER)
Admission: EM | Admit: 2022-10-01 | Discharge: 2022-10-01 | Disposition: A | Discharge status: Home / Self Care | Payer: 59 | Attending: Emergency Medicine | Admitting: Emergency Medicine

## 2022-10-01 DIAGNOSIS — Z794 Long term (current) use of insulin: Secondary | ICD-10-CM | POA: Diagnosis not present

## 2022-10-01 DIAGNOSIS — S8992XA Unspecified injury of left lower leg, initial encounter: Secondary | ICD-10-CM | POA: Diagnosis present

## 2022-10-01 DIAGNOSIS — Z7984 Long term (current) use of oral hypoglycemic drugs: Secondary | ICD-10-CM | POA: Insufficient documentation

## 2022-10-01 DIAGNOSIS — W5503XA Scratched by cat, initial encounter: Secondary | ICD-10-CM | POA: Insufficient documentation

## 2022-10-01 DIAGNOSIS — E119 Type 2 diabetes mellitus without complications: Secondary | ICD-10-CM | POA: Insufficient documentation

## 2022-10-01 DIAGNOSIS — S80812A Abrasion, left lower leg, initial encounter: Secondary | ICD-10-CM | POA: Insufficient documentation

## 2022-10-01 MED ORDER — AMOXICILLIN-POT CLAVULANATE 875-125 MG PO TABS
1.0000 | ORAL_TABLET | Freq: Once | ORAL | Status: AC
  Administered 2022-10-01: 1 via ORAL
  Filled 2022-10-01: qty 1

## 2022-10-01 MED ORDER — DOXYCYCLINE HYCLATE 100 MG PO TABS
100.0000 mg | ORAL_TABLET | Freq: Once | ORAL | Status: AC
  Administered 2022-10-01: 100 mg via ORAL
  Filled 2022-10-01: qty 1

## 2022-10-01 MED ORDER — AMOXICILLIN-POT CLAVULANATE 875-125 MG PO TABS: 1.0000 | ORAL_TABLET | Freq: Two times a day (BID) | ORAL | 0 refills | Status: AC

## 2022-10-01 MED ORDER — DOXYCYCLINE HYCLATE 100 MG PO CAPS: 100.0000 mg | ORAL_CAPSULE | Freq: Two times a day (BID) | ORAL | 0 refills | Status: AC

## 2022-10-01 NOTE — Discharge Instructions (Addendum)
Wash your wound daily with soap and water and cover with a clean bandage with antibiotic ointment.  Take the 2 antibiotics as prescribed and do not miss any doses.  Monitor for signs of infection such as increased pain, redness, pus, or any other symptoms concerning to you.  Follow-up with your doctor regarding your visit to the ER today.  Come back if any signs of worsening infection.

## 2022-10-01 NOTE — ED Provider Notes (Signed)
[place] [facility] Provider Note   CSN: [id number] Arrival date & time: 10/01/22  2157     History  Chief Complaint  Patient presents with   Leg Pain    [name] is a 52 y.o. female.  With PMH of DM, obesity presenting with left leg wound and drainage after cat scratch.  Her vaccinated cat accidentally scratched her today clot went deep into her leg.  She has baseline swelling of lower extremities.  She has been draining clear fluid from the scratch wound site since noon today when the accident happened.  She has had no fevers, no chills, no vomiting, no erythema or warmth of the leg.  Her last tetanus shot was last year.  She is not allergic to any other antibiotics besides vancomycin.  She has not been on recent antibiotics.   Leg Pain      Home Medications Prior to Admission medications   Medication Sig Start Date End Date Taking? Authorizing Provider  amoxicillin-clavulanate (AUGMENTIN) 875-125 MG tablet Take 1 tablet by mouth every 12 (twelve) hours for 13 doses. 10/02/22 10/09/22 Yes Mardene Sayer, MD  doxycycline (VIBRAMYCIN) 100 MG capsule Take 1 capsule (100 mg total) by mouth 2 (two) times daily for 13 doses. 10/02/22 10/09/22 Yes Mardene Sayer, MD  acetaminophen (TYLENOL) 325 MG tablet Take 2 tablets (650 mg total) by mouth every 6 (six) hours as needed for moderate pain. 05/30/22   Wallis Bamberg, PA-C  Blood Glucose Monitoring Suppl (TRUE METRIX METER) w/Device KIT Use as directed 11/10/20   Rema Fendt, NP  gabapentin (NEURONTIN) 100 MG capsule Take 1 capsule (100 mg total) by mouth at bedtime. 01/15/21 02/14/21  Rema Fendt, NP  glimepiride (AMARYL) 1 MG tablet TAKE 1 TABLET(1 MG) BY MOUTH TWICE DAILY WITH A MEAL 09/07/21   Rema Fendt, NP  glucose blood (TRUE METRIX BLOOD GLUCOSE TEST) test strip Use as instructed 11/10/20   Rema Fendt, NP  insulin NPH-regular Human (NOVOLIN 70/30) (70-30) 100 UNIT/ML  injection Inject into the skin. 01/22/19   [provider]  Insulin Pen Needle (PEN NEEDLES) 31G X 8 MM MISC UAD 03/01/21   Zonia Kief, Amy J, NP  ipratropium (ATROVENT) 0.03 % nasal spray Place 2 sprays into both nostrils 2 (two) times daily. 05/30/22   Wallis Bamberg, PA-C  LANTUS SOLOSTAR 100 UNIT/ML Solostar Pen ADMINISTER 13 UNITS UNDER THE SKIN AT BEDTIME 07/06/21   Rema Fendt, NP  levocetirizine (XYZAL) 5 MG tablet Take 1 tablet (5 mg total) by mouth every evening. 05/30/22   Wallis Bamberg, PA-C  loperamide (IMODIUM) 2 MG capsule Take 1 capsule (2 mg total) by mouth 2 (two) times daily as needed for diarrhea or loose stools. 08/22/22   Wallis Bamberg, PA-C  metFORMIN (GLUCOPHAGE-XR) 500 MG 24 hr tablet TAKE 2 TABLETS(1000 MG) BY MOUTH TWICE DAILY WITH A MEAL 09/07/21   Rema Fendt, NP  omeprazole (PRILOSEC) 40 MG capsule Take 40 mg by mouth daily.    [provider]  ondansetron (ZOFRAN-ODT) 8 MG disintegrating tablet Take 1 tablet (8 mg total) by mouth every 8 (eight) hours as needed for nausea or vomiting. 08/22/22   Wallis Bamberg, PA-C  pseudoephedrine (SUDAFED) 30 MG tablet Take 1 tablet (30 mg total) by mouth every 8 (eight) hours as needed for congestion. 05/30/22   Wallis Bamberg, PA-C  sucralfate (CARAFATE) 1 g tablet Take 1 tablet (1 g total) by mouth 4 (  four) times daily -  with meals and at bedtime for 7 days. 10/25/21 11/01/21  Sloan Leiter, DO  TRUEplus Lancets 28G MISC Use as directed 11/10/20   Rema Fendt, NP  pantoprazole (PROTONIX) 20 MG tablet Take 1 tablet (20 mg total) by mouth daily. 05/26/17 06/27/20  Rolan Bucco, MD      Allergies    Diclofenac, Vancomycin, and Adhesive [tape]    Review of Systems   Review of Systems  Physical Exam Updated Vital Signs BP (!) 158/94   Pulse (!) 102   Temp 97.7 F (36.5 C)   Resp 18   Ht 5\' 1"  (1.549 m)   Wt 129 kg   SpO2 97%   BMI 53.74 kg/m  Physical Exam Constitutional: Alert and oriented. Well appearing and in  no distress. Eyes: Conjunctivae are normal. ENT      Head: Normocephalic and atraumatic. Cardiovascular: S1, S2,  Normal and symmetric distal pulses are present in all extremities.Warm and well perfused. Respiratory: Normal respiratory effort. Gastrointestinal: soft Musculoskeletal: Normal range of motion in all extremities. Bilateral 2+ equal nontender pitting edema.  There is a small puncture wound in the left medial anterior shin with no surrounding erythema or warmth.  There is active clear drainage from wound site. Neurologic: Normal speech and language.  GCS 15.  AOx4.  Equal strength bilateral lower extremities.  Sensation gross intact Skin: Skin is warm, dry and intact. No rash noted. Psychiatric: Mood and affect are normal. Speech and behavior are normal.  ED Results / Procedures / Treatments   Labs (all labs ordered are listed, but only abnormal results are displayed) Labs Reviewed - No data to display  EKG None  Radiology No results found.  Procedures Ultrasound ED Soft Tissue  Date/Time: 10/01/2022 11:18 PM  Performed by: Mardene Sayer, MD Authorized by: Mardene Sayer, MD   Procedure details:    Indications: evaluate for cellulitis     Transverse view:  Visualized   Longitudinal view:  Visualized   Images: not archived     Limitations:  Body habitus Location:    Location: lower extremity     Side:  Left Findings:     no abscess present    no foreign body present     Medications Ordered in ED Medications  amoxicillin-clavulanate (AUGMENTIN) 875-125 MG per tablet 1 tablet (has no administration in time range)  doxycycline (VIBRA-TABS) tablet 100 mg (has no administration in time range)    ED Course/ Medical Decision Making/ A&P                             Medical Decision Making [name] is a 52 y.o. female.  With PMH of DM, obesity presenting with left leg wound and drainage after cat scratch.    Patient has baseline  peripheral edema of bilateral lower extremities that is equal and nontender.  At site of cat scratch she has active clear drainage likely from underlying edema.  Performed bedside ultrasound which showed no abscess present.  She has no erythema or warmth or tenderness at site suggestive of active cellulitis.  Unlikely to be active cellulitis as cat scratch happened earlier today.  Last tetanus was in 2023.  Wound was cleaned here with chlorhexidine and applied bacitracin with bandage.  Will double coverage due to underlying comorbidities with Augmentin and doxycycline.  Advised continued wound care and follow-up with PCP with return precautions.  Final Clinical Impression(s) / ED Diagnoses Final diagnoses:  Cat scratch    Rx / DC Orders ED Discharge Orders          Ordered    amoxicillin-clavulanate (AUGMENTIN) 875-125 MG tablet  Every 12 hours        10/01/22 2315    doxycycline (VIBRAMYCIN) 100 MG capsule  2 times daily        10/01/22 2315              Mardene Sayer, MD 10/01/22 2319

## 2022-10-01 NOTE — ED Triage Notes (Signed)
Pt presents to ED Pov. Pt c/o injury to L lower leg. Pt reports that her cat scratched her leg earlier today and fluid has been coming out of her leg since.

## 2022-10-18 ENCOUNTER — Emergency Department (HOSPITAL_BASED_OUTPATIENT_CLINIC_OR_DEPARTMENT_OTHER): Payer: BLUE CROSS/BLUE SHIELD

## 2022-10-18 ENCOUNTER — Ambulatory Visit
Admission: EM | Admit: 2022-10-18 | Discharge: 2022-10-18 | Disposition: A | Discharge status: Home / Self Care | Payer: BLUE CROSS/BLUE SHIELD | Attending: Urgent Care | Admitting: Urgent Care

## 2022-10-18 ENCOUNTER — Other Ambulatory Visit: Payer: Self-pay

## 2022-10-18 ENCOUNTER — Encounter (HOSPITAL_BASED_OUTPATIENT_CLINIC_OR_DEPARTMENT_OTHER): Payer: Self-pay | Admitting: Emergency Medicine

## 2022-10-18 ENCOUNTER — Emergency Department (HOSPITAL_BASED_OUTPATIENT_CLINIC_OR_DEPARTMENT_OTHER)
Admission: EM | Admit: 2022-10-18 | Discharge: 2022-10-18 | Disposition: A | Discharge status: Home / Self Care | Payer: BLUE CROSS/BLUE SHIELD | Attending: Emergency Medicine | Admitting: Emergency Medicine

## 2022-10-18 DIAGNOSIS — R1031 Right lower quadrant pain: Secondary | ICD-10-CM | POA: Diagnosis not present

## 2022-10-18 DIAGNOSIS — Z794 Long term (current) use of insulin: Secondary | ICD-10-CM | POA: Diagnosis not present

## 2022-10-18 DIAGNOSIS — R197 Diarrhea, unspecified: Secondary | ICD-10-CM | POA: Insufficient documentation

## 2022-10-18 DIAGNOSIS — R319 Hematuria, unspecified: Secondary | ICD-10-CM | POA: Diagnosis not present

## 2022-10-18 DIAGNOSIS — Z7984 Long term (current) use of oral hypoglycemic drugs: Secondary | ICD-10-CM | POA: Diagnosis not present

## 2022-10-18 DIAGNOSIS — M549 Dorsalgia, unspecified: Secondary | ICD-10-CM

## 2022-10-18 DIAGNOSIS — R109 Unspecified abdominal pain: Secondary | ICD-10-CM | POA: Diagnosis not present

## 2022-10-18 DIAGNOSIS — E119 Type 2 diabetes mellitus without complications: Secondary | ICD-10-CM | POA: Diagnosis not present

## 2022-10-18 LAB — COMPREHENSIVE METABOLIC PANEL
ALT: 25 U/L (ref 0–44)
AST: 27 U/L (ref 15–41)
Albumin: 3.4 g/dL — ABNORMAL LOW (ref 3.5–5.0)
Alkaline Phosphatase: 114 U/L (ref 38–126)
Anion gap: 9 (ref 5–15)
BUN: 14 mg/dL (ref 6–20)
CO2: 27 mmol/L (ref 22–32)
Calcium: 8.9 mg/dL (ref 8.9–10.3)
Chloride: 98 mmol/L (ref 98–111)
Creatinine, Ser: 0.77 mg/dL (ref 0.44–1.00)
GFR, Estimated: >60 mL/min (ref >60)
Glucose, Bld: 344 mg/dL — ABNORMAL HIGH (ref 70–99)
Potassium: 4.2 mmol/L (ref 3.5–5.1)
Sodium: 134 mmol/L — ABNORMAL LOW (ref 135–145)
Total Bilirubin: 0.5 mg/dL (ref 0.3–1.2)
Total Protein: 7.3 g/dL (ref 6.5–8.1)

## 2022-10-18 LAB — URINALYSIS, ROUTINE W REFLEX MICROSCOPIC
Bilirubin Urine: NEGATIVE
Glucose, UA: >=500 mg/dL — AB
Ketones, ur: NEGATIVE mg/dL
Leukocytes,Ua: NEGATIVE
Nitrite: NEGATIVE
Protein, ur: NEGATIVE mg/dL
Specific Gravity, Urine: 1.015 (ref 1.005–1.030)
pH: 5.5 (ref 5.0–8.0)

## 2022-10-18 LAB — POCT FASTING CBG KUC MANUAL ENTRY: POCT Glucose (KUC): 413 mg/dL — AB (ref 70–99)

## 2022-10-18 LAB — POCT URINALYSIS DIP (MANUAL ENTRY)
Bilirubin, UA: NEGATIVE
Glucose, UA: >=1000 mg/dL — AB
Ketones, POC UA: NEGATIVE mg/dL
Leukocytes, UA: NEGATIVE
Nitrite, UA: NEGATIVE
Protein Ur, POC: NEGATIVE mg/dL
Spec Grav, UA: 1.015 (ref 1.010–1.025)
Urobilinogen, UA: 0.2 E.U./dL
pH, UA: 5 (ref 5.0–8.0)

## 2022-10-18 LAB — CBC
HCT: 40.7 % (ref 36.0–46.0)
Hemoglobin: 13.2 g/dL (ref 12.0–15.0)
MCH: 27.2 pg (ref 26.0–34.0)
MCHC: 32.4 g/dL (ref 30.0–36.0)
MCV: 83.7 fL (ref 80.0–100.0)
Platelets: 366 10*3/uL (ref 150–400)
RBC: 4.86 MIL/uL (ref 3.87–5.11)
RDW: 14.7 % (ref 11.5–15.5)
WBC: 9.8 10*3/uL (ref 4.0–10.5)
nRBC: 0 % (ref 0.0–0.2)

## 2022-10-18 LAB — URINALYSIS, MICROSCOPIC (REFLEX): WBC, UA: NONE SEEN WBC/hpf (ref 0–5)

## 2022-10-18 LAB — LIPASE, BLOOD: Lipase: 63 U/L — ABNORMAL HIGH (ref 11–51)

## 2022-10-18 MED ORDER — KETOROLAC TROMETHAMINE 30 MG/ML IJ SOLN
30.0000 mg | Freq: Once | INTRAMUSCULAR | Status: AC
  Administered 2022-10-18: 30 mg via INTRAVENOUS
  Filled 2022-10-18: qty 1

## 2022-10-18 NOTE — Discharge Instructions (Addendum)
As discussed, your imaging shows no acute abdominal abnormalities - no sign of kidney stones or inflammation of your internal organs. You can continue taking Tylenol for your discomfort as needed. Your pain can be due to constipation, as moderate amounts of stool was noted on your imaging. You can try OTC medications if you feel as if you're not having complete bowel movements.  Please follow up with your PCP in 2 days for reevaluation if your symptoms persist.  Get help right away if: You cannot stop vomiting. Your pain is only in one part of your belly, like on the right side. You have bloody or black poop, or poop that looks like tar. You have trouble breathing. You have chest pain.

## 2022-10-18 NOTE — ED Triage Notes (Signed)
Pt POV c/o RLQ pain starting Sunday, progressively worsening.  Pain now wraps around to back.  Denies difficulty with urination.  C/o nausea and diarrhea, denies emesis.

## 2022-10-18 NOTE — ED Provider Notes (Signed)
[facility] - URGENT [facility]ARE CENTER  Note:  This document was prepared using Conservation officer, historic buildings and may include unintentional dictation errors.  MRN: [id number] DOB: 1970/07/04  Subjective:   [name] is a 52 y.o. female presenting for 3-day history of acute onset persistent moderate to severe right-sided flank pain, right-sided abdominal pain.  Has felt nauseous but no vomiting.  No diarrhea, constipation, bloody stools, chest pain, shortness of breath, diaphoresis.  Patient defecates 1-2 times daily.  Does not strain.  Does not have hard stools, has a complete bowel movement.  No personal history of renal stones but it does run in her family.  She has had a cholecystectomy.  No history of an appendectomy.  Patient is a type II diabetic treated with insulin.  Diet is noncompliant.  No current facility-administered medications for this encounter.  Current Outpatient Medications:    acetaminophen (TYLENOL) 325 MG tablet, Take 2 tablets (650 mg total) by mouth every 6 (six) hours as needed for moderate pain., Disp: 30 tablet, Rfl: 0   Blood Glucose Monitoring Suppl (TRUE METRIX METER) w/Device KIT, Use as directed, Disp: 1 kit, Rfl: 0   gabapentin (NEURONTIN) 100 MG capsule, Take 1 capsule (100 mg total) by mouth at bedtime., Disp: 30 capsule, Rfl: 0   glimepiride (AMARYL) 1 MG tablet, TAKE 1 TABLET(1 MG) BY MOUTH TWICE DAILY WITH A MEAL, Disp: 180 tablet, Rfl: 0   glucose blood (TRUE METRIX BLOOD GLUCOSE TEST) test strip, Use as instructed, Disp: 100 each, Rfl: 12   insulin NPH-regular Human (NOVOLIN 70/30) (70-30) 100 UNIT/ML injection, Inject into the skin., Disp: , Rfl:    Insulin Pen Needle (PEN NEEDLES) 31G X 8 MM MISC, UAD, Disp: 100 each, Rfl: 0   ipratropium (ATROVENT) 0.03 % nasal spray, Place 2 sprays into both nostrils 2 (two) times daily., Disp: 30 mL, Rfl: 0   LANTUS SOLOSTAR 100 UNIT/ML Solostar Pen, ADMINISTER 13 UNITS UNDER THE SKIN AT BEDTIME, Disp: 12  mL, Rfl: 0   levocetirizine (XYZAL) 5 MG tablet, Take 1 tablet (5 mg total) by mouth every evening., Disp: 30 tablet, Rfl: 0   loperamide (IMODIUM) 2 MG capsule, Take 1 capsule (2 mg total) by mouth 2 (two) times daily as needed for diarrhea or loose stools., Disp: 14 capsule, Rfl: 0   metFORMIN (GLUCOPHAGE-XR) 500 MG 24 hr tablet, TAKE 2 TABLETS(1000 MG) BY MOUTH TWICE DAILY WITH A MEAL, Disp: 360 tablet, Rfl: 0   omeprazole (PRILOSEC) 40 MG capsule, Take 40 mg by mouth daily., Disp: , Rfl:    ondansetron (ZOFRAN-ODT) 8 MG disintegrating tablet, Take 1 tablet (8 mg total) by mouth every 8 (eight) hours as needed for nausea or vomiting., Disp: 20 tablet, Rfl: 0   pseudoephedrine (SUDAFED) 30 MG tablet, Take 1 tablet (30 mg total) by mouth every 8 (eight) hours as needed for congestion., Disp: 30 tablet, Rfl: 0   sucralfate (CARAFATE) 1 g tablet, Take 1 tablet (1 g total) by mouth 4 (four) times daily -  with meals and at bedtime for 7 days., Disp: 28 tablet, Rfl: 0   TRUEplus Lancets 28G MISC, Use as directed, Disp: 100 each, Rfl: 4   Allergies  Allergen Reactions   Diclofenac Other (See Comments)    "red man syndrome"   Vancomycin Other (See Comments)    Red man syndrome   Adhesive [Tape] Itching and Rash    Past Medical History:  Diagnosis Date   Cellulitis 07/2011   Diabetes  mellitus    dx 2016   GERD (gastroesophageal reflux disease)      Past Surgical History:  Procedure Laterality Date   ABLATION     CARPAL TUNNEL RELEASE Left 09/13/2016   Procedure: LEFT CARPAL TUNNEL RELEASE;  Surgeon: Cindee Salt, MD;  Location: [facility] OR;  Service: Orthopedics;  Laterality: Left;   CHOLECYSTECTOMY     KNEE ARTHROSCOPY     LAPAROSCOPIC TUBAL LIGATION  04/29/2011   Procedure: LAPAROSCOPIC TUBAL LIGATION;  Surgeon: Turner Daniels, MD;  Location: [facility];  Service: Gynecology;  Laterality: Bilateral;  with Filshie Clips   ROTATOR CUFF REPAIR     TUBAL LIGATION      Family History  Problem  Relation Age of Onset   Cancer Mother    Diabetes Mother    Hypertension Mother     Social History   Tobacco Use   Smoking status: Former    Packs/day: 0.50    Years: 2.00    Additional pack years: 0.00    Total pack years: 1.00    Types: Cigarettes    Quit date: 07/22/2011    Years since quitting: 11.2    Passive exposure: Past   Smokeless tobacco: Never  Vaping Use   Vaping Use: Never used  Substance Use Topics   Alcohol use: Yes    Comment: occ   Drug use: No    ROS   Objective:   Vitals: BP 110/72 (BP Location: Right Arm)   Pulse 95   Temp 97.9 F (36.6 C)   Resp 20   SpO2 97%   Physical Exam Constitutional:      General: She is not in acute distress.    Appearance: Normal appearance. She is well-developed. She is not ill-appearing, toxic-appearing or diaphoretic.  HENT:     Head: Normocephalic and atraumatic.     Nose: Nose normal.     Mouth/Throat:     Mouth: Mucous membranes are moist.     Pharynx: Oropharynx is clear.  Eyes:     General: No scleral icterus.       Right eye: No discharge.        Left eye: No discharge.     Extraocular Movements: Extraocular movements intact.     Conjunctiva/sclera: Conjunctivae normal.  Cardiovascular:     Rate and Rhythm: Normal rate.  Pulmonary:     Effort: Pulmonary effort is normal.  Abdominal:     General: Bowel sounds are normal. There is no distension.     Palpations: Abdomen is soft. There is no mass.     Tenderness: There is abdominal tenderness in the right upper quadrant, right lower quadrant and periumbilical area. There is right CVA tenderness, guarding and rebound. There is no left CVA tenderness.  Skin:    General: Skin is warm and dry.  Neurological:     General: No focal deficit present.     Mental Status: She is alert and oriented to person, place, and time.  Psychiatric:        Mood and Affect: Mood normal.        Behavior: Behavior normal.        Thought Content: Thought content normal.         Judgment: Judgment normal.     Results for orders placed or performed during the hospital encounter of 10/18/22 (from the past 24 hour(s))  POCT CBG (manual entry)     Status: Abnormal   Collection Time: 10/18/22  1:53 PM  Result Value Ref Range   POCT Glucose (K[facility]) 413 (A) 70 - 99 mg/dL  POCT urinalysis dipstick     Status: Abnormal   Collection Time: 10/18/22  1:56 PM  Result Value Ref Range   Color, UA yellow yellow   Clarity, UA clear clear   Glucose, UA >=1,000 (A) negative mg/dL   Bilirubin, UA negative negative   Ketones, POC UA negative negative mg/dL   Spec Grav, UA 1.610 9.604 - 1.025   Blood, UA moderate (A) negative   pH, UA 5.0 5.0 - 8.0   Protein Ur, POC negative negative mg/dL   Urobilinogen, UA 0.2 0.2 or 1.0 E.U./dL   Nitrite, UA Negative Negative   Leukocytes, UA Negative Negative    Assessment and Plan :   PDMP not reviewed this encounter.  1. Right flank pain   2. Right sided abdominal pain   3. Hematuria, unspecified type   4. Costovertebral angle tenderness   5. Type 2 diabetes mellitus treated with insulin [facility])    Patient has concerning physical exam findings and right-sided abdominal pain.  X-ray imaging would be of low yield and therefore deferred this.  Recommended presenting to the emergency room as she is in need of a higher level of testing and care than we can provide in the urgent care setting.  This includes consideration for CT scan, blood work to rule out acute abdomen, pancreatitis, diverticulitis, appendicitis, obstructive uropathy, volvulus among other medical emergencies.  Patient is hemodynamically stable and can present to the hospital by personal vehicle.   Wallis Bamberg, [place] 10/18/22 1413

## 2022-10-18 NOTE — ED Triage Notes (Signed)
Pt c/o right side abd pain x 3 days-last dose tylenol last pm-NAD-steady gait

## 2022-10-18 NOTE — Discharge Instructions (Signed)
Go to the emergency room for further testing and care than we can provide in the urgent care setting. This includes consideration for blood work, CT scans to rule obstructive uropathy (kidney stone), acute abdomen, diverticulitis, volvulus, appendicitis.

## 2022-10-18 NOTE — ED Notes (Signed)
Patient is being discharged from the Urgent Care and sent to the Emergency Department via [place], [place] . Per POV, patient is in need of higher level of care due to abd pain. Patient is aware and verbalizes understanding of plan of care.  Vitals:   10/18/22 1335  BP: 110/72  Pulse: 95  Resp: 20  Temp: 97.9 F (36.6 C)  SpO2: 97%

## 2022-10-18 NOTE — ED Provider Notes (Signed)
[facility] AT [facility] Provider Note   CSN: [id number] Arrival date & time: 10/18/22  1713     History  Chief Complaint  Patient presents with   Abdominal Pain    [name] is a 52 y.o. female with a history of uncontrolled type 2 diabetes mellitus, obesity, and GERD who presents to the ED today for abdominal pain for the past 3 days. The pain is persistent and located in the right flank and wraps around to the right lower quadrant. She reports associated nausea and diarrhea but denies vomiting, fever, or changes to urination. She is able to eat and drink okay. Patient reports some relief of symptoms with Tylenol. No history of kidney stones in the past and she denies drinking much water throughout the day. No other complaints or concerns at this time.    Home Medications Prior to Admission medications   Medication Sig Start Date End Date Taking? Authorizing Provider  acetaminophen (TYLENOL) 325 MG tablet Take 2 tablets (650 mg total) by mouth every 6 (six) hours as needed for moderate pain. 05/30/22   Wallis Bamberg, PA-C  Blood Glucose Monitoring Suppl (TRUE METRIX METER) w/Device KIT Use as directed 11/10/20   Rema Fendt, NP  gabapentin (NEURONTIN) 100 MG capsule Take 1 capsule (100 mg total) by mouth at bedtime. 01/15/21 02/14/21  Rema Fendt, NP  glimepiride (AMARYL) 1 MG tablet TAKE 1 TABLET(1 MG) BY MOUTH TWICE DAILY WITH A MEAL 09/07/21   Rema Fendt, NP  glucose blood (TRUE METRIX BLOOD GLUCOSE TEST) test strip Use as instructed 11/10/20   Rema Fendt, NP  insulin NPH-regular Human (NOVOLIN 70/30) (70-30) 100 UNIT/ML injection Inject into the skin. 01/22/19   [provider]  Insulin Pen Needle (PEN NEEDLES) 31G X 8 MM MISC UAD 03/01/21   Zonia Kief, Amy J, NP  ipratropium (ATROVENT) 0.03 % nasal spray Place 2 sprays into both nostrils 2 (two) times daily. 05/30/22   Wallis Bamberg, PA-C  LANTUS SOLOSTAR 100 UNIT/ML Solostar  Pen ADMINISTER 13 UNITS UNDER THE SKIN AT BEDTIME 07/06/21   Rema Fendt, NP  levocetirizine (XYZAL) 5 MG tablet Take 1 tablet (5 mg total) by mouth every evening. 05/30/22   Wallis Bamberg, PA-C  loperamide (IMODIUM) 2 MG capsule Take 1 capsule (2 mg total) by mouth 2 (two) times daily as needed for diarrhea or loose stools. 08/22/22   Wallis Bamberg, PA-C  metFORMIN (GLUCOPHAGE-XR) 500 MG 24 hr tablet TAKE 2 TABLETS(1000 MG) BY MOUTH TWICE DAILY WITH A MEAL 09/07/21   Rema Fendt, NP  omeprazole (PRILOSEC) 40 MG capsule Take 40 mg by mouth daily.    [provider]  ondansetron (ZOFRAN-ODT) 8 MG disintegrating tablet Take 1 tablet (8 mg total) by mouth every 8 (eight) hours as needed for nausea or vomiting. 08/22/22   Wallis Bamberg, PA-C  pseudoephedrine (SUDAFED) 30 MG tablet Take 1 tablet (30 mg total) by mouth every 8 (eight) hours as needed for congestion. 05/30/22   Wallis Bamberg, PA-C  sucralfate (CARAFATE) 1 g tablet Take 1 tablet (1 g total) by mouth 4 (four) times daily -  with meals and at bedtime for 7 days. 10/25/21 11/01/21  Sloan Leiter, DO  TRUEplus Lancets 28G MISC Use as directed 11/10/20   Rema Fendt, NP  pantoprazole (PROTONIX) 20 MG tablet Take 1 tablet (20 mg total) by mouth daily. 05/26/17 06/27/20  Rolan Bucco, MD  Allergies    Diclofenac, Vancomycin, and Adhesive [tape]    Review of Systems   Review of Systems  Gastrointestinal:  Positive for abdominal pain, diarrhea and nausea.  All other systems reviewed and are negative.   Physical Exam Updated Vital Signs BP 112/76   Pulse 86   Temp 98.6 F (37 C) (Oral)   Resp 20   Ht 5\' 1"  (1.549 m)   Wt 134.7 kg   SpO2 98%   BMI 56.12 kg/m  Physical Exam Vitals and nursing note reviewed.  Constitutional:      Appearance: Normal appearance.  HENT:     Head: Normocephalic and atraumatic.     Mouth/Throat:     Mouth: Mucous membranes are moist.  Eyes:     Conjunctiva/sclera: Conjunctivae normal.      Pupils: Pupils are equal, round, and reactive to light.  Cardiovascular:     Rate and Rhythm: Normal rate and regular rhythm.     Pulses: Normal pulses.     Heart sounds: Normal heart sounds.  Pulmonary:     Effort: Pulmonary effort is normal.     Breath sounds: Normal breath sounds.  Abdominal:     Palpations: Abdomen is soft.     Tenderness: There is abdominal tenderness. There is right CVA tenderness. There is no left CVA tenderness.     Comments: Tenderness to palpation of the right flank and right lower quadrant  Musculoskeletal:        General: No tenderness.  Skin:    General: Skin is warm and dry.     Capillary Refill: Capillary refill takes less than 2 seconds.     Findings: No rash.  Neurological:     General: No focal deficit present.     Mental Status: She is alert.  Psychiatric:        Mood and Affect: Mood normal.        Behavior: Behavior normal.     ED Results / Procedures / Treatments   Labs (all labs ordered are listed, but only abnormal results are displayed) Labs Reviewed  LIPASE, BLOOD - Abnormal; Notable for the following components:      Result Value   Lipase 63 (*)    All other components within normal limits  COMPREHENSIVE METABOLIC PANEL - Abnormal; Notable for the following components:   Sodium 134 (*)    Glucose, Bld 344 (*)    Albumin 3.4 (*)    All other components within normal limits  URINALYSIS, ROUTINE W REFLEX MICROSCOPIC - Abnormal; Notable for the following components:   Glucose, UA >=500 (*)    Hgb urine dipstick MODERATE (*)    All other components within normal limits  URINALYSIS, MICROSCOPIC (REFLEX) - Abnormal; Notable for the following components:   Bacteria, UA RARE (*)    All other components within normal limits  CBC    EKG None  Radiology CT Renal Stone Study  Result Date: 10/18/2022 CLINICAL DATA:  Right-sided flank pain for 2 days, initial encounter EXAM: CT ABDOMEN AND PELVIS WITHOUT CONTRAST TECHNIQUE:  Multidetector CT imaging of the abdomen and pelvis was performed following the standard protocol without IV contrast. RADIATION DOSE REDUCTION: This exam was performed according to the departmental dose-optimization program which includes automated exposure control, adjustment of the mA and/or kV according to patient size and/or use of iterative reconstruction technique. COMPARISON:  10/14/2019 FINDINGS: Lower chest: No acute abnormality. Hepatobiliary: No focal liver abnormality is seen. Status post cholecystectomy. No biliary dilatation. Pancreas:  Unremarkable. No pancreatic ductal dilatation or surrounding inflammatory changes. Spleen: Normal in size without focal abnormality. Adrenals/Urinary Tract: Adrenal glands are within normal limits. Kidneys are well visualized bilaterally. No renal calculi or obstructive changes are noted. The ureters are within normal limits. Bladder is decompressed. Stomach/Bowel: No obstructive or inflammatory changes of the colon are seen. The appendix is within normal limits. Small bowel and stomach are unremarkable. Vascular/Lymphatic: No significant vascular findings are present. No enlarged abdominal or pelvic lymph nodes. Reproductive: Uterus and bilateral adnexa are unremarkable. Tubal ligation clips are noted. Other: No abdominal wall hernia or abnormality. No abdominopelvic ascites. 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 IMPRESSION: No acute abnormality noted. Electronically Signed   By: Alcide Clever M.D.   On: 10/18/2022 19:41    Procedures Procedures: not indicated.   Medications Ordered in ED Medications  ketorolac (TORADOL) 30 MG/ML injection 30 mg (30 mg Intravenous Given 10/18/22 1905)    ED Course/ Medical Decision Making/ A&P                             Medical Decision Making Amount and/or Complexity of Data Reviewed Labs: ordered. Radiology: ordered.  Risk Prescription drug management.   This patient  presents to the ED for concern of right flank and RLQ pain, this involves an extensive number of treatment options, and is a complaint that carries with it a high risk of complications and morbidity.   Differential diagnosis includes: UTI vs pyelonephritis vs nephrolithiasis vs appendicitis vs bowel obstruction vs volvulus vs constipation, etc.   Co morbidities that complicate the patient evaluation  Uncontrolled diabetes mellitus   Additional history obtained:  Additional history obtained from patient's records.   Lab Tests:  I ordered and personally interpreted labs.  The pertinent results include:  elevated glucose on CMP otherwise within normal limits. CBC unremarkable. U/A and lipase within normal limits.   Imaging Studies:  I ordered imaging studies including CT renal stone study I independently visualized and interpreted imaging which showed: I agree with the radiologist interpretation    Problem List / ED Course / Critical interventions / Medication management  Right flank and RLQ pain I ordered medications including: Toradol  for pain - patient has taken this medication before and denies any allergic reaction Reevaluation of the patient after these medicines showed that the patient improved I have reviewed the patients home medicines and have made adjustments as needed   Social Determinants of Health:  Physical activity   Test / Admission - Considered:  Discussed results with patient. Patient is stable and safe for discharge home. Strict return precautions provided.       Final Clinical Impression(s) / ED Diagnoses Final diagnoses:  Right lower quadrant abdominal pain  Diarrhea, unspecified type    Rx / DC Orders ED Discharge Orders     None         Maxwell Marion, PA-C 10/18/22 2246    Cathren Laine, MD 10/19/22 1520

## 2022-11-08 ENCOUNTER — Ambulatory Visit (INDEPENDENT_AMBULATORY_CARE_PROVIDER_SITE_OTHER): Payer: BLUE CROSS/BLUE SHIELD

## 2022-11-08 ENCOUNTER — Ambulatory Visit
Admission: EM | Admit: 2022-11-08 | Discharge: 2022-11-08 | Disposition: A | Discharge status: Home / Self Care | Payer: BLUE CROSS/BLUE SHIELD | Attending: Physician Assistant | Admitting: Physician Assistant

## 2022-11-08 DIAGNOSIS — S8001XA Contusion of right knee, initial encounter: Secondary | ICD-10-CM

## 2022-11-08 DIAGNOSIS — M25561 Pain in right knee: Secondary | ICD-10-CM | POA: Diagnosis not present

## 2022-11-08 NOTE — Discharge Instructions (Signed)
Return if any problems.  See the Orthopaedist if pain persist

## 2022-11-08 NOTE — ED Provider Notes (Signed)
[facility]    CSN: [id number] Arrival date & time: 11/08/22  0856      History   Chief Complaint Chief Complaint  Patient presents with   Fall    HPI [name] is a 52 y.o. female.   Patient reports she fell 3 weeks ago and struck her right knee.  Patient has had continued pain since the fall.  Patient reports some difficulty walking.  Patient reports she has been walking a lot having to go to the hospital to visit her husband.  The history is provided by the patient. No language interpreter was used.  Fall    Past Medical History:  Diagnosis Date   Cellulitis 07/2011   Diabetes mellitus    dx 2016   GERD (gastroesophageal reflux disease)     Patient Active Problem List   Diagnosis Date Noted   Left lumbar radiculopathy 02/22/2019   Trigger finger of left thumb 01/07/2019   Cellulitis and abscess of right leg 01/02/2018   Bilateral carpal tunnel syndrome 07/06/2016   Left elbow pain 02/17/2016   Uncontrolled type 2 diabetes mellitus with hyperglycemia (HCC) 10/29/2015   Uncontrolled type 2 diabetes mellitus without complication, with long-term current use of insulin 10/29/2015   Injury of right hand 05/19/2015   Cervical disc disorder with radiculopathy, unspecified cervical region 10/10/2013   Diabetes (HCC) 04/07/2013   Pyelonephritis 04/06/2013   RLQ abdominal pain 04/06/2013   Fever 09/07/2012   Headache(784.0) 09/07/2012   Cellulitis 08/05/2011   Peripheral edema 08/05/2011   Gastroesophageal reflux disease 08/05/2011   BMI 50.0-59.9, adult (HCC) 08/05/2011    Past Surgical History:  Procedure Laterality Date   ABLATION     CARPAL TUNNEL RELEASE Left 09/13/2016   Procedure: LEFT CARPAL TUNNEL RELEASE;  Surgeon: Cindee Salt, MD;  Location: [facility] OR;  Service: Orthopedics;  Laterality: Left;   CHOLECYSTECTOMY     KNEE ARTHROSCOPY     LAPAROSCOPIC TUBAL LIGATION  04/29/2011   Procedure: LAPAROSCOPIC TUBAL LIGATION;  Surgeon: Turner Daniels, MD;  Location: [facility];  Service: Gynecology;  Laterality: Bilateral;  with Filshie Clips   ROTATOR CUFF REPAIR     TUBAL LIGATION      OB History   No obstetric history on file.      Home Medications    Prior to Admission medications   Medication Sig Start Date End Date Taking? Authorizing Provider  acetaminophen (TYLENOL) 325 MG tablet Take 2 tablets (650 mg total) by mouth every 6 (six) hours as needed for moderate pain. 05/30/22   Wallis Bamberg, PA-C  Blood Glucose Monitoring Suppl (TRUE METRIX METER) w/Device KIT Use as directed 11/10/20   Rema Fendt, NP  gabapentin (NEURONTIN) 100 MG capsule Take 1 capsule (100 mg total) by mouth at bedtime. 01/15/21 02/14/21  Rema Fendt, NP  glimepiride (AMARYL) 1 MG tablet TAKE 1 TABLET(1 MG) BY MOUTH TWICE DAILY WITH A MEAL 09/07/21   Rema Fendt, NP  glucose blood (TRUE METRIX BLOOD GLUCOSE TEST) test strip Use as instructed 11/10/20   Rema Fendt, NP  insulin NPH-regular Human (NOVOLIN 70/30) (70-30) 100 UNIT/ML injection Inject into the skin. 01/22/19   [provider]  Insulin Pen Needle (PEN NEEDLES) 31G X 8 MM MISC UAD 03/01/21   Zonia Kief, Amy J, NP  ipratropium (ATROVENT) 0.03 % nasal spray Place 2 sprays into both nostrils 2 (two) times daily. 05/30/22   Wallis Bamberg, PA-C  LANTUS SOLOSTAR 100 UNIT/ML Solostar Pen  ADMINISTER 13 UNITS UNDER THE SKIN AT BEDTIME 07/06/21   Rema Fendt, NP  levocetirizine (XYZAL) 5 MG tablet Take 1 tablet (5 mg total) by mouth every evening. 05/30/22   Wallis Bamberg, PA-C  loperamide (IMODIUM) 2 MG capsule Take 1 capsule (2 mg total) by mouth 2 (two) times daily as needed for diarrhea or loose stools. 08/22/22   Wallis Bamberg, PA-C  metFORMIN (GLUCOPHAGE-XR) 500 MG 24 hr tablet TAKE 2 TABLETS(1000 MG) BY MOUTH TWICE DAILY WITH A MEAL 09/07/21   Rema Fendt, NP  omeprazole (PRILOSEC) 40 MG capsule Take 40 mg by mouth daily.    [provider]  ondansetron (ZOFRAN-ODT) 8 MG  disintegrating tablet Take 1 tablet (8 mg total) by mouth every 8 (eight) hours as needed for nausea or vomiting. 08/22/22   Wallis Bamberg, PA-C  pseudoephedrine (SUDAFED) 30 MG tablet Take 1 tablet (30 mg total) by mouth every 8 (eight) hours as needed for congestion. 05/30/22   Wallis Bamberg, PA-C  sucralfate (CARAFATE) 1 g tablet Take 1 tablet (1 g total) by mouth 4 (four) times daily -  with meals and at bedtime for 7 days. 10/25/21 11/01/21  Sloan Leiter, DO  TRUEplus Lancets 28G MISC Use as directed 11/10/20   Rema Fendt, NP  pantoprazole (PROTONIX) 20 MG tablet Take 1 tablet (20 mg total) by mouth daily. 05/26/17 06/27/20  Rolan Bucco, MD    Family History Family History  Problem Relation Age of Onset   Cancer Mother    Diabetes Mother    Hypertension Mother     Social History Social History   Tobacco Use   Smoking status: Former    Current packs/day: 0.00    Average packs/day: 0.5 packs/day for 2.0 years (1.0 ttl pk-yrs)    Types: Cigarettes    Start date: 07/21/2009    Quit date: 07/22/2011    Years since quitting: 11.3    Passive exposure: Past   Smokeless tobacco: Never  Vaping Use   Vaping status: Never Used  Substance Use Topics   Alcohol use: Yes    Comment: occ   Drug use: No     Allergies   Diclofenac, Vancomycin, and Adhesive [tape]   Review of Systems Review of Systems  All other systems reviewed and are negative.    Physical Exam Triage Vital Signs ED Triage Vitals  Encounter Vitals Group     BP 11/08/22 0938 124/83     Systolic BP Percentile --      Diastolic BP Percentile --      Pulse Rate 11/08/22 0938 91     Resp 11/08/22 0938 18     Temp 11/08/22 0938 98.1 F (36.7 C)     Temp Source 11/08/22 0938 Oral     SpO2 11/08/22 0938 95 %     Weight --      Height --      Head Circumference --      Peak Flow --      Pain Score 11/08/22 0937 4     Pain Loc --      Pain Education --      Exclude from Growth Chart --    No data  found.  Updated Vital Signs BP 124/83 (BP Location: Right Arm)   Pulse 91   Temp 98.1 F (36.7 C) (Oral)   Resp 18   SpO2 95%   Visual Acuity Right Eye Distance:   Left Eye Distance:   Bilateral  Distance:    Right Eye Near:   Left Eye Near:    Bilateral Near:     Physical Exam Vitals and nursing note reviewed.  Constitutional:      Appearance: She is well-developed.  HENT:     Head: Normocephalic.  Cardiovascular:     Rate and Rhythm: Normal rate.  Pulmonary:     Effort: Pulmonary effort is normal.  Abdominal:     General: There is no distension.  Musculoskeletal:        General: Tenderness present.     Cervical back: Normal range of motion.     Comments: Tender right knee pain with range of motion neurovascular neurosensory are intact  Skin:    General: Skin is warm.  Neurological:     General: No focal deficit present.     Mental Status: She is alert and oriented to person, place, and time.      UC Treatments / Results  Labs (all labs ordered are listed, but only abnormal results are displayed) Labs Reviewed - No data to display  EKG   Radiology DG Knee Complete 4 Views Right  Result Date: 11/08/2022 CLINICAL DATA:  Right knee pain after fall 3 weeks ago. EXAM: RIGHT KNEE - COMPLETE 4+ VIEW COMPARISON:  None Available. FINDINGS: No evidence of fracture, dislocation, or joint effusion. Moderate narrowing of medial joint space is noted with osteophyte formation. Mild to moderate narrowing of patellofemoral space is noted as well. Soft tissues are unremarkable. IMPRESSION: Mild to moderate degenerative joint disease as described above. No acute abnormality seen. Electronically Signed   By: Lupita Raider M.D.   On: 11/08/2022 10:51    Procedures Procedures (including critical care time)  Medications Ordered in UC Medications - No data to display  Initial Impression / Assessment and Plan / UC Course  I have reviewed the triage vital signs and the nursing  notes.  Pertinent labs & imaging results that were available during my care of the patient were reviewed by me and considered in my medical decision making (see chart for details).     Patient given crutches patient advised ice elevate ibuprofen or Tylenol for discomfort patient is advised to follow-up with orthopedist if pain persist Final Clinical Impressions(s) / UC Diagnoses   Final diagnoses:  Contusion of right knee, initial encounter     Discharge Instructions      Return if any problems.  See the Orthopaedist if pain persist.     ED Prescriptions   None    PDMP not reviewed this encounter. An After Visit Summary was printed and given to the patient.       Elson Areas, [place] 11/08/22 1118

## 2022-11-08 NOTE — ED Triage Notes (Signed)
Pt presents with c/o rt knee pain X 3 weeks. Reports she fell a few weeks ago.   Pt states she has taken Tylenol at home.

## 2022-12-06 ENCOUNTER — Encounter (HOSPITAL_BASED_OUTPATIENT_CLINIC_OR_DEPARTMENT_OTHER): Payer: Self-pay | Admitting: Urology

## 2022-12-06 ENCOUNTER — Emergency Department (HOSPITAL_BASED_OUTPATIENT_CLINIC_OR_DEPARTMENT_OTHER)
Admission: EM | Admit: 2022-12-06 | Discharge: 2022-12-06 | Disposition: A | Discharge status: Home / Self Care | Payer: BLUE CROSS/BLUE SHIELD

## 2022-12-06 ENCOUNTER — Emergency Department (HOSPITAL_BASED_OUTPATIENT_CLINIC_OR_DEPARTMENT_OTHER): Payer: BLUE CROSS/BLUE SHIELD

## 2022-12-06 ENCOUNTER — Other Ambulatory Visit: Payer: Self-pay

## 2022-12-06 DIAGNOSIS — E119 Type 2 diabetes mellitus without complications: Secondary | ICD-10-CM | POA: Diagnosis not present

## 2022-12-06 DIAGNOSIS — M25561 Pain in right knee: Secondary | ICD-10-CM

## 2022-12-06 DIAGNOSIS — M1990 Unspecified osteoarthritis, unspecified site: Secondary | ICD-10-CM | POA: Insufficient documentation

## 2022-12-06 DIAGNOSIS — Z794 Long term (current) use of insulin: Secondary | ICD-10-CM | POA: Insufficient documentation

## 2022-12-06 DIAGNOSIS — Z7984 Long term (current) use of oral hypoglycemic drugs: Secondary | ICD-10-CM | POA: Insufficient documentation

## 2022-12-06 DIAGNOSIS — Z79899 Other long term (current) drug therapy: Secondary | ICD-10-CM | POA: Insufficient documentation

## 2022-12-06 DIAGNOSIS — M1711 Unilateral primary osteoarthritis, right knee: Secondary | ICD-10-CM

## 2022-12-06 MED ORDER — MELOXICAM 7.5 MG PO TABS: 7.5000 mg | ORAL_TABLET | Freq: Every day | ORAL | 0 refills | Status: AC

## 2022-12-06 NOTE — Discharge Instructions (Signed)
Please read and follow all provided instructions.  Your diagnoses today include:  1. Acute pain of right knee   2. Osteoarthritis of right knee, unspecified osteoarthritis type     Tests performed today include: An x-ray of the affected area - does NOT show any broken bones, shows arthritis throughout the knee Vital signs. See below for your results today.   Medications prescribed:  Meloxicam - anti-inflammatory pain medication  You have been prescribed an anti-inflammatory medication or NSAID. Take with food. Do not take aspirin, ibuprofen, or naproxen if taking this medication. Take smallest effective dose for the shortest duration needed for your pain. Stop taking if you experience stomach pain or vomiting.   Take any prescribed medications only as directed.  Home care instructions:  Follow any educational materials contained in this packet Follow R.I.C.E. Protocol: R - rest your injury  I  - use ice on injury without applying directly to skin C - compress injury with bandage or splint E - elevate the injury as much as possible  Follow-up instructions: Please follow-up with your primary care provider or the provided orthopedic physician (bone specialist) if you continue to have significant pain in 1 week. In this case you may have a more severe injury that requires further care.   Return instructions:  Please return if your toes or feet are numb or tingling, appear gray or blue, or you have severe pain (also elevate the leg and loosen splint or wrap if you were given one) Please return to the Emergency Department if you experience worsening symptoms.  Please return if you have any other emergent concerns.  Additional Information:  Your vital signs today were: BP (!) 143/71   Pulse 89   Temp 98.7 F (37.1 C)   Resp 18   Ht 5\' 1"  (1.549 m)   Wt 131.5 kg   SpO2 97%   BMI 54.80 kg/m  If your blood pressure (BP) was elevated above 135/85 this visit, please have this  repeated by your doctor within one month. --------------

## 2022-12-06 NOTE — ED Triage Notes (Signed)
Right lateral knee pain x 3 weeks with mild swelling  Deneis any new injury   Pain worse with ROM and weight bearing

## 2022-12-06 NOTE — ED Provider Notes (Signed)
[place] [facility] Provider Note   CSN: [id number] Arrival date & time: 12/06/22  0840     History  Chief Complaint  Patient presents with   Knee Pain    [name] is a 52 y.o. female.  Patient presents to the emergency room today for evaluation of right knee pain.  Symptoms started about 3 weeks ago.  Symptoms occur daily.  Pain is over the lateral portion of the joint.  It is worse with bearing weight and movements.  She denies injuries.  She has tried Tylenol without improvement.  She has chronic swelling in her lower extremities which is unchanged.  No redness, warmth of the knee.  She does endorse some swelling around the knee.  She takes medications for diabetes.       Home Medications Prior to Admission medications   Medication Sig Start Date End Date Taking? Authorizing Provider  acetaminophen (TYLENOL) 325 MG tablet Take 2 tablets (650 mg total) by mouth every 6 (six) hours as needed for moderate pain. 05/30/22   Wallis Bamberg, PA-C  Blood Glucose Monitoring Suppl (TRUE METRIX METER) w/Device KIT Use as directed 11/10/20   Rema Fendt, NP  gabapentin (NEURONTIN) 100 MG capsule Take 1 capsule (100 mg total) by mouth at bedtime. 01/15/21 02/14/21  Rema Fendt, NP  glimepiride (AMARYL) 1 MG tablet TAKE 1 TABLET(1 MG) BY MOUTH TWICE DAILY WITH A MEAL 09/07/21   Rema Fendt, NP  glucose blood (TRUE METRIX BLOOD GLUCOSE TEST) test strip Use as instructed 11/10/20   Rema Fendt, NP  insulin NPH-regular Human (NOVOLIN 70/30) (70-30) 100 UNIT/ML injection Inject into the skin. 01/22/19   [provider]  Insulin Pen Needle (PEN NEEDLES) 31G X 8 MM MISC UAD 03/01/21   Zonia Kief, Amy J, NP  ipratropium (ATROVENT) 0.03 % nasal spray Place 2 sprays into both nostrils 2 (two) times daily. 05/30/22   Wallis Bamberg, PA-C  LANTUS SOLOSTAR 100 UNIT/ML Solostar Pen ADMINISTER 13 UNITS UNDER THE SKIN AT BEDTIME 07/06/21   Rema Fendt, NP  levocetirizine (XYZAL) 5 MG tablet Take 1 tablet (5 mg total) by mouth every evening. 05/30/22   Wallis Bamberg, PA-C  loperamide (IMODIUM) 2 MG capsule Take 1 capsule (2 mg total) by mouth 2 (two) times daily as needed for diarrhea or loose stools. 08/22/22   Wallis Bamberg, PA-C  metFORMIN (GLUCOPHAGE-XR) 500 MG 24 hr tablet TAKE 2 TABLETS(1000 MG) BY MOUTH TWICE DAILY WITH A MEAL 09/07/21   Rema Fendt, NP  omeprazole (PRILOSEC) 40 MG capsule Take 40 mg by mouth daily.    [provider]  ondansetron (ZOFRAN-ODT) 8 MG disintegrating tablet Take 1 tablet (8 mg total) by mouth every 8 (eight) hours as needed for nausea or vomiting. 08/22/22   Wallis Bamberg, PA-C  pseudoephedrine (SUDAFED) 30 MG tablet Take 1 tablet (30 mg total) by mouth every 8 (eight) hours as needed for congestion. 05/30/22   Wallis Bamberg, PA-C  sucralfate (CARAFATE) 1 g tablet Take 1 tablet (1 g total) by mouth 4 (four) times daily -  with meals and at bedtime for 7 days. 10/25/21 11/01/21  Sloan Leiter, DO  TRUEplus Lancets 28G MISC Use as directed 11/10/20   Rema Fendt, NP  pantoprazole (PROTONIX) 20 MG tablet Take 1 tablet (20 mg total) by mouth daily. 05/26/17 06/27/20  Rolan Bucco, MD      Allergies    Diclofenac, Vancomycin, and  Adhesive [tape]    Review of Systems   Review of Systems  Physical Exam Updated Vital Signs BP (!) 143/71   Pulse 89   Temp 98.7 F (37.1 C)   Resp 18   Ht 5\' 1"  (1.549 m)   Wt 131.5 kg   SpO2 97%   BMI 54.80 kg/m  Physical Exam Vitals and nursing note reviewed.  Constitutional:      Appearance: She is well-developed.  HENT:     Head: Normocephalic and atraumatic.  Eyes:     Pupils: Pupils are equal, round, and reactive to light.  Cardiovascular:     Pulses: Normal pulses. No decreased pulses.  Musculoskeletal:        General: Tenderness present.     Cervical back: Normal range of motion and neck supple.     Right knee: No swelling, effusion, erythema or bony  tenderness. Normal range of motion. Tenderness present over the lateral joint line.     Left knee: Normal range of motion. No tenderness.  Skin:    General: Skin is warm and dry.  Neurological:     Mental Status: She is alert.     Sensory: No sensory deficit.     Comments: Motor, sensation, and vascular distal to the injury is fully intact.   Psychiatric:        Mood and Affect: Mood normal.     ED Results / Procedures / Treatments   Labs (all labs ordered are listed, but only abnormal results are displayed) Labs Reviewed - No data to display  EKG None  Radiology No results found.  Procedures Procedures    Medications Ordered in ED Medications - No data to display  ED Course/ Medical Decision Making/ A&P    Patient seen and examined. History obtained directly from patient.   Labs/EKG: None ordered  Imaging: Ordered x-ray of the right knee  Medications/Fluids: None ordered  Most recent vital signs reviewed and are as follows: BP (!) 143/71   Pulse 89   Temp 98.7 F (37.1 C)   Resp 18   Ht 5\' 1"  (1.549 m)   Wt 131.5 kg   SpO2 97%   BMI 54.80 kg/m   Initial impression: Lateral knee pain, no injury  10:46 AM Reassessment performed. Patient appears stable.  Imaging personally visualized and interpreted including: X-ray of the knee, agree no fracture or dislocation.  Reviewed pertinent lab work and imaging with patient at bedside. Questions answered.   Most current vital signs reviewed and are as follows: BP (!) 143/71   Pulse 89   Temp 98.7 F (37.1 C)   Resp 18   Ht 5\' 1"  (1.549 m)   Wt 131.5 kg   SpO2 97%   BMI 54.80 kg/m   Plan: Discharge to home.   Prescriptions written for: Meloxicam trial x 10 days  Other home care instructions discussed: RICE protocol  ED return instructions discussed: New or worsening symptoms  Follow-up instructions discussed: Patient encouraged to follow-up with their PCP in 7 days.                                    Medical Decision Making Amount and/or Complexity of Data Reviewed Radiology: ordered.   Patient with knee pain, reports 3 weeks, however review of previous records shows that which she was seen a month ago after having a fall.  X-ray today shows arthritis.  No  significant effusion.  Patient is able to range her knee.  Do not suspect gout or septic arthritis at this time.  No history of rheumatoid or other autoimmune disease.        Final Clinical Impression(s) / ED Diagnoses Final diagnoses:  Acute pain of right knee  Osteoarthritis of right knee, unspecified osteoarthritis type    Rx / DC Orders ED Discharge Orders          Ordered    meloxicam (MOBIC) 7.5 MG tablet  Daily        12/06/22 1043              Renne Crigler, PA-C 12/06/22 1047    Coral Spikes, DO 12/06/22 1523

## 2022-12-15 ENCOUNTER — Ambulatory Visit
Admission: EM | Admit: 2022-12-15 | Discharge: 2022-12-15 | Disposition: A | Discharge status: Home / Self Care | Payer: BLUE CROSS/BLUE SHIELD | Attending: Internal Medicine | Admitting: Internal Medicine

## 2022-12-15 ENCOUNTER — Other Ambulatory Visit: Payer: Self-pay

## 2022-12-15 DIAGNOSIS — J029 Acute pharyngitis, unspecified: Secondary | ICD-10-CM

## 2022-12-15 DIAGNOSIS — R519 Headache, unspecified: Secondary | ICD-10-CM | POA: Diagnosis present

## 2022-12-15 DIAGNOSIS — Z87891 Personal history of nicotine dependence: Secondary | ICD-10-CM | POA: Insufficient documentation

## 2022-12-15 DIAGNOSIS — B349 Viral infection, unspecified: Secondary | ICD-10-CM | POA: Diagnosis not present

## 2022-12-15 DIAGNOSIS — R197 Diarrhea, unspecified: Secondary | ICD-10-CM | POA: Diagnosis present

## 2022-12-15 DIAGNOSIS — Z1152 Encounter for screening for COVID-19: Secondary | ICD-10-CM | POA: Insufficient documentation

## 2022-12-15 LAB — POCT RAPID STREP A (OFFICE): Rapid Strep A Screen: NEGATIVE

## 2022-12-15 MED ORDER — BENZONATATE 200 MG PO CAPS
200.0000 mg | ORAL_CAPSULE | Freq: Three times a day (TID) | ORAL | 0 refills | Status: DC | PRN
Start: 2022-12-15 — End: 2024-02-17

## 2022-12-15 MED ORDER — ONDANSETRON 4 MG PO TBDP: 4.0000 mg | ORAL_TABLET | Freq: Three times a day (TID) | ORAL | 0 refills | Status: DC | PRN

## 2022-12-15 NOTE — ED Triage Notes (Signed)
Pt presents with c/o sore throat, headaches, diarrhea x 3 days. States she has taken Tylenol at home, reports she has had some relief.

## 2022-12-15 NOTE — Discharge Instructions (Signed)
The clinic will contact you with results of the COVID test done today if positive.  You may take Zofran as needed for nausea and vomiting.  Tessalon as needed for cough.  Lots of rest and fluids.  Please follow-up with your PCP in 2 days for recheck.  Please go to the ER for any worsening symptoms.  I hope you feel better soon!

## 2022-12-15 NOTE — ED Provider Notes (Signed)
[facility]    CSN: [id number] Arrival date & time: 12/15/22  1210      History   Chief Complaint Chief Complaint  Patient presents with   Headache   Sore Throat    HPI [name] is a 52 y.o. female  presents for evaluation of URI symptoms for 3 days. Patient reports associated symptoms of sore throat, headaches, diarrhea, nausea, cough. Denies bleeding, fevers, ear pain, body aches, shortness of breath. Patient does not have a hx of asthma or smoking. No known sick contacts.  Pt has taken Tylenol OTC for symptoms. Pt has no other concerns at this time.    Headache Associated symptoms: cough, diarrhea, nausea and sore throat   Sore Throat Associated symptoms include headaches.    Past Medical History:  Diagnosis Date   Cellulitis 07/2011   Diabetes mellitus    dx 2016   GERD (gastroesophageal reflux disease)     Patient Active Problem List   Diagnosis Date Noted   Left lumbar radiculopathy 02/22/2019   Trigger finger of left thumb 01/07/2019   Cellulitis and abscess of right leg 01/02/2018   Bilateral carpal tunnel syndrome 07/06/2016   Left elbow pain 02/17/2016   Uncontrolled type 2 diabetes mellitus with hyperglycemia (HCC) 10/29/2015   Uncontrolled type 2 diabetes mellitus without complication, with long-term current use of insulin 10/29/2015   Injury of right hand 05/19/2015   Cervical disc disorder with radiculopathy, unspecified cervical region 10/10/2013   Diabetes (HCC) 04/07/2013   Pyelonephritis 04/06/2013   RLQ abdominal pain 04/06/2013   Fever 09/07/2012   Headache(784.0) 09/07/2012   Cellulitis 08/05/2011   Peripheral edema 08/05/2011   Gastroesophageal reflux disease 08/05/2011   BMI 50.0-59.9, adult (HCC) 08/05/2011    Past Surgical History:  Procedure Laterality Date   ABLATION     CARPAL TUNNEL RELEASE Left 09/13/2016   Procedure: LEFT CARPAL TUNNEL RELEASE;  Surgeon: Cindee Salt, MD;  Location: [facility] OR;  Service:  Orthopedics;  Laterality: Left;   CHOLECYSTECTOMY     KNEE ARTHROSCOPY     LAPAROSCOPIC TUBAL LIGATION  04/29/2011   Procedure: LAPAROSCOPIC TUBAL LIGATION;  Surgeon: Turner Daniels, MD;  Location: [facility];  Service: Gynecology;  Laterality: Bilateral;  with Filshie Clips   ROTATOR CUFF REPAIR     TUBAL LIGATION      OB History   No obstetric history on file.      Home Medications    Prior to Admission medications   Medication Sig Start Date End Date Taking? Authorizing Provider  benzonatate (TESSALON) 200 MG capsule Take 1 capsule (200 mg total) by mouth 3 (three) times daily as needed. 12/15/22  Yes Radford Pax, NP  ondansetron (ZOFRAN-ODT) 4 MG disintegrating tablet Take 1 tablet (4 mg total) by mouth every 8 (eight) hours as needed for nausea or vomiting. 12/15/22  Yes Radford Pax, NP  acetaminophen (TYLENOL) 325 MG tablet Take 2 tablets (650 mg total) by mouth every 6 (six) hours as needed for moderate pain. 05/30/22   Wallis Bamberg, PA-C  Blood Glucose Monitoring Suppl (TRUE METRIX METER) w/Device KIT Use as directed 11/10/20   Rema Fendt, NP  gabapentin (NEURONTIN) 100 MG capsule Take 1 capsule (100 mg total) by mouth at bedtime. 01/15/21 02/14/21  Rema Fendt, NP  glimepiride (AMARYL) 1 MG tablet TAKE 1 TABLET(1 MG) BY MOUTH TWICE DAILY WITH A MEAL 09/07/21   Zonia Kief, Amy J, NP  glucose blood (TRUE METRIX BLOOD GLUCOSE TEST)  test strip Use as instructed 11/10/20   Rema Fendt, NP  insulin NPH-regular Human (NOVOLIN 70/30) (70-30) 100 UNIT/ML injection Inject into the skin. 01/22/19   [provider]  Insulin Pen Needle (PEN NEEDLES) 31G X 8 MM MISC UAD 03/01/21   Zonia Kief, Amy J, NP  ipratropium (ATROVENT) 0.03 % nasal spray Place 2 sprays into both nostrils 2 (two) times daily. 05/30/22   Wallis Bamberg, PA-C  LANTUS SOLOSTAR 100 UNIT/ML Solostar Pen ADMINISTER 13 UNITS UNDER THE SKIN AT BEDTIME 07/06/21   Rema Fendt, NP  levocetirizine (XYZAL) 5 MG tablet Take 1  tablet (5 mg total) by mouth every evening. 05/30/22   Wallis Bamberg, PA-C  loperamide (IMODIUM) 2 MG capsule Take 1 capsule (2 mg total) by mouth 2 (two) times daily as needed for diarrhea or loose stools. 08/22/22   Wallis Bamberg, PA-C  meloxicam (MOBIC) 7.5 MG tablet Take 1 tablet (7.5 mg total) by mouth daily. 12/06/22   Renne Crigler, PA-C  metFORMIN (GLUCOPHAGE-XR) 500 MG 24 hr tablet TAKE 2 TABLETS(1000 MG) BY MOUTH TWICE DAILY WITH A MEAL 09/07/21   Rema Fendt, NP  omeprazole (PRILOSEC) 40 MG capsule Take 40 mg by mouth daily.    [provider]  pseudoephedrine (SUDAFED) 30 MG tablet Take 1 tablet (30 mg total) by mouth every 8 (eight) hours as needed for congestion. 05/30/22   Wallis Bamberg, PA-C  sucralfate (CARAFATE) 1 g tablet Take 1 tablet (1 g total) by mouth 4 (four) times daily -  with meals and at bedtime for 7 days. 10/25/21 11/01/21  Sloan Leiter, DO  TRUEplus Lancets 28G MISC Use as directed 11/10/20   Rema Fendt, NP  pantoprazole (PROTONIX) 20 MG tablet Take 1 tablet (20 mg total) by mouth daily. 05/26/17 06/27/20  Rolan Bucco, MD    Family History Family History  Problem Relation Age of Onset   Cancer Mother    Diabetes Mother    Hypertension Mother     Social History Social History   Tobacco Use   Smoking status: Former    Current packs/day: 0.00    Average packs/day: 0.5 packs/day for 2.0 years (1.0 ttl pk-yrs)    Types: Cigarettes    Start date: 07/21/2009    Quit date: 07/22/2011    Years since quitting: 11.4    Passive exposure: Past   Smokeless tobacco: Never  Vaping Use   Vaping status: Never Used  Substance Use Topics   Alcohol use: Yes    Comment: occ   Drug use: No     Allergies   Diclofenac, Vancomycin, and Adhesive [tape]   Review of Systems Review of Systems  HENT:  Positive for sore throat.   Respiratory:  Positive for cough.   Gastrointestinal:  Positive for diarrhea and nausea.  Neurological:  Positive for headaches.      Physical Exam Triage Vital Signs ED Triage Vitals  Encounter Vitals Group     BP 12/15/22 1225 112/62     Systolic BP Percentile --      Diastolic BP Percentile --      Pulse Rate 12/15/22 1224 87     Resp 12/15/22 1224 18     Temp 12/15/22 1224 98.2 F (36.8 C)     Temp Source 12/15/22 1224 Oral     SpO2 12/15/22 1224 94 %     Weight --      Height --      Head Circumference --  Peak Flow --      Pain Score 12/15/22 1223 3     Pain Loc --      Pain Education --      Exclude from Growth Chart --    No data found.  Updated Vital Signs BP 112/62   Pulse 87   Temp 98.2 F (36.8 C) (Oral)   Resp 18   SpO2 94%   Visual Acuity Right Eye Distance:   Left Eye Distance:   Bilateral Distance:    Right Eye Near:   Left Eye Near:    Bilateral Near:     Physical Exam Vitals and nursing note reviewed.  Constitutional:      General: She is not in acute distress.    Appearance: She is well-developed. She is not ill-appearing.  HENT:     Head: Normocephalic and atraumatic.     Right Ear: Tympanic membrane and ear canal normal.     Left Ear: Tympanic membrane and ear canal normal.     Nose: Congestion present.     Mouth/Throat:     Mouth: Mucous membranes are moist.     Pharynx: Oropharynx is clear. Uvula midline. Posterior oropharyngeal erythema present.     Tonsils: No tonsillar exudate or tonsillar abscesses.  Eyes:     Conjunctiva/sclera: Conjunctivae normal.     Pupils: Pupils are equal, round, and reactive to light.  Cardiovascular:     Rate and Rhythm: Normal rate and regular rhythm.     Heart sounds: Normal heart sounds.  Pulmonary:     Effort: Pulmonary effort is normal.     Breath sounds: Normal breath sounds.  Musculoskeletal:     Cervical back: Normal range of motion and neck supple.  Lymphadenopathy:     Cervical: No cervical adenopathy.  Skin:    General: Skin is warm and dry.  Neurological:     General: No focal deficit present.      Mental Status: She is alert and oriented to person, place, and time.  Psychiatric:        Mood and Affect: Mood normal.        Behavior: Behavior normal.      UC Treatments / Results  Labs (all labs ordered are listed, but only abnormal results are displayed) Labs Reviewed  SARS CORONAVIRUS 2 (TAT 6-24 HRS)  CULTURE, GROUP A STREP [facility])  POCT RAPID STREP A (OFFICE)    EKG   Radiology No results found.  Procedures Procedures (including critical care time)  Medications Ordered in UC Medications - No data to display  Initial Impression / Assessment and Plan / UC Course  I have reviewed the triage vital signs and the nursing notes.  Pertinent labs & imaging results that were available during my care of the patient were reviewed by me and considered in my medical decision making (see chart for details).     Reviewed exam and symptoms with patient.  No red flags.  Negative rapid strep, will culture.  COVID PCR and will contact if positive.  Discussed viral illness and symptomatic treatment.  Zofran as needed for nausea/vomiting.  Tessalon as needed for cough.  PCP follow-up 2 days for recheck.  ER precautions reviewed and patient verbalized understanding. Final Clinical Impressions(s) / UC Diagnoses   Final diagnoses:  Sore throat  Viral illness     Discharge Instructions      The clinic will contact you with results of the COVID test done today if positive.  You  may take Zofran as needed for nausea and vomiting.  Tessalon as needed for cough.  Lots of rest and fluids.  Please follow-up with your PCP in 2 days for recheck.  Please go to the ER for any worsening symptoms.  I hope you feel better soon!     ED Prescriptions     Medication Sig Dispense Auth. Provider   ondansetron (ZOFRAN-ODT) 4 MG disintegrating tablet Take 1 tablet (4 mg total) by mouth every 8 (eight) hours as needed for nausea or vomiting. 20 tablet Radford Pax, NP   benzonatate (TESSALON) 200 MG  capsule Take 1 capsule (200 mg total) by mouth 3 (three) times daily as needed. 20 capsule Radford Pax, NP      PDMP not reviewed this encounter.   Radford Pax, NP 12/15/22 1239

## 2022-12-16 LAB — SARS CORONAVIRUS 2 (TAT 6-24 HRS): SARS Coronavirus 2: NEGATIVE

## 2022-12-18 LAB — CULTURE, GROUP A STREP (THRC)

## 2023-10-25 ENCOUNTER — Emergency Department (HOSPITAL_COMMUNITY)
Admission: EM | Admit: 2023-10-25 | Discharge: 2023-10-25 | Disposition: A | Discharge status: Home / Self Care | Attending: Student | Admitting: Student

## 2023-10-25 ENCOUNTER — Encounter (HOSPITAL_COMMUNITY): Payer: Self-pay | Admitting: Emergency Medicine

## 2023-10-25 ENCOUNTER — Other Ambulatory Visit: Payer: Self-pay

## 2023-10-25 ENCOUNTER — Emergency Department (HOSPITAL_COMMUNITY)

## 2023-10-25 DIAGNOSIS — X500XXA Overexertion from strenuous movement or load, initial encounter: Secondary | ICD-10-CM | POA: Insufficient documentation

## 2023-10-25 DIAGNOSIS — M7712 Lateral epicondylitis, left elbow: Secondary | ICD-10-CM | POA: Insufficient documentation

## 2023-10-25 DIAGNOSIS — M25522 Pain in left elbow: Secondary | ICD-10-CM | POA: Diagnosis present

## 2023-10-25 MED ORDER — HYDROCODONE-ACETAMINOPHEN 5-325 MG PO TABS
1.0000 | ORAL_TABLET | Freq: Once | ORAL | Status: AC
  Administered 2023-10-25: 1 via ORAL
  Filled 2023-10-25: qty 1

## 2023-10-25 MED ORDER — HYDROCODONE-ACETAMINOPHEN 5-325 MG PO TABS: ORAL_TABLET | ORAL | 0 refills | Status: DC

## 2023-10-25 NOTE — ED Provider Notes (Signed)
 [place] EMERGENCY DEPARTMENT AT [facility] Provider Note   CSN: [id number] Arrival date & time: 10/25/23  8097     Patient presents with: Extremity Pain   [name] is a 53 y.o. female.    Extremity Pain Pertinent negatives include no chest pain, no abdominal pain, no headaches and no shortness of breath.       [name] is a 53 y.o. female who presents to the Emergency Department complaining of left elbow pain after lifting a heavy basket.  She describes having pain to the lateral elbow that worsens with movement.  Pain resolves when arm is held at 90 degree angle.  Denies any previous surgeries or injuries over elbow.  No radiating pain numbness or weakness of her extremity.  She has left hand dominant.   Prior to Admission medications   Medication Sig Start Date End Date Taking? Authorizing Provider  HYDROcodone -acetaminophen  (NORCO/VICODIN) 5-325 MG tablet Take one tab po q 4 hrs prn pain 10/25/23  Yes Aizik Reh, PA-C  acetaminophen  (TYLENOL ) 325 MG tablet Take 2 tablets (650 mg total) by mouth every 6 (six) hours as needed for moderate pain. 05/30/22   Christopher Savannah, PA-C  benzonatate  (TESSALON ) 200 MG capsule Take 1 capsule (200 mg total) by mouth 3 (three) times daily as needed. 12/15/22   Mayer, Jodi R, NP  Blood Glucose Monitoring Suppl (TRUE METRIX METER) w/Device KIT Use as directed 11/10/20   Lorren Greig PARAS, NP  gabapentin  (NEURONTIN ) 100 MG capsule Take 1 capsule (100 mg total) by mouth at bedtime. 01/15/21 02/14/21  Lorren Greig PARAS, NP  glimepiride  (AMARYL ) 1 MG tablet TAKE 1 TABLET(1 MG) BY MOUTH TWICE DAILY WITH A MEAL 09/07/21   Lorren Greig PARAS, NP  glucose blood (TRUE METRIX BLOOD GLUCOSE TEST) test strip Use as instructed 11/10/20   Lorren Greig PARAS, NP  insulin  NPH-regular Human (NOVOLIN 70/30) (70-30) 100 UNIT/ML injection Inject into the skin. 01/22/19   [provider]  Insulin  Pen Needle (PEN NEEDLES) 31G X 8 MM MISC UAD  03/01/21   Lorren Greig PARAS, NP  ipratropium (ATROVENT ) 0.03 % nasal spray Place 2 sprays into both nostrils 2 (two) times daily. 05/30/22   Christopher Savannah, PA-C  LANTUS  SOLOSTAR 100 UNIT/ML Solostar Pen ADMINISTER 13 UNITS UNDER THE SKIN AT BEDTIME 07/06/21   Lorren Greig PARAS, NP  levocetirizine (XYZAL ) 5 MG tablet Take 1 tablet (5 mg total) by mouth every evening. 05/30/22   Christopher Savannah, PA-C  loperamide  (IMODIUM ) 2 MG capsule Take 1 capsule (2 mg total) by mouth 2 (two) times daily as needed for diarrhea or loose stools. 08/22/22   Christopher Savannah, PA-C  meloxicam  (MOBIC ) 7.5 MG tablet Take 1 tablet (7.5 mg total) by mouth daily. 12/06/22   Geiple, Joshua, PA-C  metFORMIN  (GLUCOPHAGE -XR) 500 MG 24 hr tablet TAKE 2 TABLETS(1000 MG) BY MOUTH TWICE DAILY WITH A MEAL 09/07/21   Lorren Greig PARAS, NP  omeprazole  (PRILOSEC ) 40 MG capsule Take 40 mg by mouth daily.    [provider]  ondansetron  (ZOFRAN -ODT) 4 MG disintegrating tablet Take 1 tablet (4 mg total) by mouth every 8 (eight) hours as needed for nausea or vomiting. 12/15/22   Mayer, Jodi R, NP  pseudoephedrine  (SUDAFED) 30 MG tablet Take 1 tablet (30 mg total) by mouth every 8 (eight) hours as needed for congestion. 05/30/22   Christopher Savannah, PA-C  sucralfate  (CARAFATE ) 1 g tablet Take 1 tablet (1 g total) by mouth 4 (four)  times daily -  with meals and at bedtime for 7 days. 10/25/21 11/01/21  Elnor Jayson LABOR, DO  TRUEplus Lancets 28G MISC Use as directed 11/10/20   Lorren Greig PARAS, NP  pantoprazole  (PROTONIX ) 20 MG tablet Take 1 tablet (20 mg total) by mouth daily. 05/26/17 06/27/20  Lenor Hollering, MD    Allergies: Diclofenac , Vancomycin , and Adhesive [tape]    Review of Systems  Constitutional:  Negative for chills and fever.  Respiratory:  Negative for shortness of breath.   Cardiovascular:  Negative for chest pain.  Gastrointestinal:  Negative for abdominal pain, nausea and vomiting.  Musculoskeletal:  Positive for arthralgias (left elbow pain).   Skin:  Negative for color change, rash and wound.  Neurological:  Negative for dizziness, weakness, numbness and headaches.    Updated Vital Signs BP 110/89 (BP Location: Right Arm)   Pulse 85   Temp 98 F (36.7 C) (Oral)   Resp 17   Ht 5' 1 (1.549 m)   Wt 126.1 kg   SpO2 98%   BMI 52.55 kg/m   Physical Exam Vitals and nursing note reviewed.  Constitutional:      General: She is not in acute distress.    Appearance: Normal appearance. She is not ill-appearing or toxic-appearing.  Cardiovascular:     Rate and Rhythm: Normal rate and regular rhythm.     Pulses: Normal pulses.  Pulmonary:     Effort: Pulmonary effort is normal.  Musculoskeletal:        General: Tenderness and signs of injury present. No swelling.     Left elbow: No swelling. Normal range of motion. Tenderness present.     Comments: Localized tenderness to the lateral epicondyle area of the left elbow.  There is no edema erythema or excessive warmth.  Grip strengths are strong and symmetrical bilaterally.  Left shoulder and wrist are nontender.  Skin:    General: Skin is warm.     Capillary Refill: Capillary refill takes less than 2 seconds.  Neurological:     General: No focal deficit present.     Mental Status: She is alert.     Sensory: No sensory deficit.     Motor: No weakness.     (all labs ordered are listed, but only abnormal results are displayed) Labs Reviewed - No data to display  EKG: None  Radiology: DG Elbow Complete Left Result Date: 10/25/2023 CLINICAL DATA:  Elbow injury after lifting heavy object. EXAM: LEFT ELBOW - COMPLETE 3+ VIEW COMPARISON:  None Available. FINDINGS: There is no evidence of acute fracture or dislocation. Degenerative changes are present in the lateral epicondyle. No joint effusion is seen. The soft tissues are within normal limits. IMPRESSION: No acute fracture or dislocation. Electronically Signed   By: Leita Birmingham M.D.   On: 10/25/2023 19:59     Procedures    Medications Ordered in the ED  HYDROcodone -acetaminophen  (NORCO/VICODIN) 5-325 MG per tablet 1 tablet (1 tablet Oral Given 10/25/23 2134)                                    Medical Decision Making Patient here after injury to the left elbow.  States she picked up a heavy basket and felt a sharp pain of her elbow.  She denies any fall.  She is having pain with movement of the elbow, pain resolves when held at 90 degree angle.   Lateral epicondylitis  versus sprain.  Fracture, dislocation felt less likely. Septic joint less likely in setting of injury     Amount and/or Complexity of Data Reviewed Radiology: ordered.    Details: No acute fracture or dislocation, no joint effusion seen. Discussion of management or test interpretation with external provider(s): Suspect traumatic lateral epicondylitis.  Pain began after lifting injury.  X-ray reviewed, Neurovascularly intact.   patient agreeable to symptomatic treatment and close outpatient follow-up with orthopedics.  Risk Prescription drug management.        Final diagnoses:  Lateral epicondylitis of left elbow    ED Discharge Orders          Ordered    HYDROcodone -acetaminophen  (NORCO/VICODIN) 5-325 MG tablet        10/25/23 2202               Herlinda Milling, PA-C 10/25/23 2311    Kommor, Lum, MD 10/26/23 1743

## 2023-10-25 NOTE — ED Notes (Signed)
 ED Provider at bedside.

## 2023-10-25 NOTE — Discharge Instructions (Signed)
 Your x-ray this evening did not show any broken bones or dislocations.  only wear the sling when needed for comfort do not wear continuously or at bedtime.  Ice packs on and off to your elbow.  Avoid heavy lifting with your left arm.  I recommend that you also take over-the-counter ibuprofen , 800 mg with food 3 times a day.  Please call the orthopedic provider listed to arrange follow-up appointment.

## 2023-10-25 NOTE — ED Triage Notes (Signed)
 Pt c/o left elbow pain after lifting something heavy this past Monday. Pain has not gone away and has gotten worse. Pt states holding arm in a 90 degree angle makes it better. Pt states unable to pick up anything heavy with that arm or completely straightening arm without pain. Pulses in tact. NAD at this time

## 2023-12-18 ENCOUNTER — Emergency Department (HOSPITAL_COMMUNITY): Admission: EM | Admit: 2023-12-18 | Discharge: 2023-12-18 | Disposition: A | Discharge status: Home / Self Care

## 2023-12-18 ENCOUNTER — Encounter (HOSPITAL_COMMUNITY): Payer: Self-pay

## 2023-12-18 ENCOUNTER — Other Ambulatory Visit: Payer: Self-pay

## 2023-12-18 DIAGNOSIS — X58XXXA Exposure to other specified factors, initial encounter: Secondary | ICD-10-CM | POA: Diagnosis not present

## 2023-12-18 DIAGNOSIS — S025XXA Fracture of tooth (traumatic), initial encounter for closed fracture: Secondary | ICD-10-CM | POA: Diagnosis not present

## 2023-12-18 DIAGNOSIS — K0889 Other specified disorders of teeth and supporting structures: Secondary | ICD-10-CM | POA: Diagnosis present

## 2023-12-18 NOTE — ED Provider Notes (Signed)
 [place] EMERGENCY DEPARTMENT AT [facility] Provider Note   CSN: [id number] Arrival date & time: 12/18/23  1916     Patient presents with: Dental Pain   [name] is a 53 y.o. female.   Dental Pain Patient is a 53 year old female was in ED today for concerns for possible dental fracture tooth 1, noted that she was eating a near strip when she bit and felt a crack in her right tooth.  Since then she has had some mild pain to the area as well as noting that her tooth feels loose.  Denies fever, headache, vision changes, chest pain, dysphagia, odynophagia, hearing loss, decreased ROM at TMJ.     Prior to Admission medications   Medication Sig Start Date End Date Taking? Authorizing Provider  acetaminophen  (TYLENOL ) 325 MG tablet Take 2 tablets (650 mg total) by mouth every 6 (six) hours as needed for moderate pain. 05/30/22   Christopher Savannah, PA-C  benzonatate  (TESSALON ) 200 MG capsule Take 1 capsule (200 mg total) by mouth 3 (three) times daily as needed. 12/15/22   Mayer, Jodi R, NP  Blood Glucose Monitoring Suppl (TRUE METRIX METER) w/Device KIT Use as directed 11/10/20   Lorren Greig PARAS, NP  gabapentin  (NEURONTIN ) 100 MG capsule Take 1 capsule (100 mg total) by mouth at bedtime. 01/15/21 02/14/21  Lorren Greig PARAS, NP  glimepiride  (AMARYL ) 1 MG tablet TAKE 1 TABLET(1 MG) BY MOUTH TWICE DAILY WITH A MEAL 09/07/21   Lorren Greig PARAS, NP  glucose blood (TRUE METRIX BLOOD GLUCOSE TEST) test strip Use as instructed 11/10/20   Lorren Greig PARAS, NP  HYDROcodone -acetaminophen  (NORCO/VICODIN) 5-325 MG tablet Take one tab po q 4 hrs prn pain 10/25/23   Triplett, Tammy, PA-C  insulin  NPH-regular Human (NOVOLIN 70/30) (70-30) 100 UNIT/ML injection Inject into the skin. 01/22/19   [provider]  Insulin  Pen Needle (PEN NEEDLES) 31G X 8 MM MISC UAD 03/01/21   Lorren Greig J, NP  ipratropium (ATROVENT ) 0.03 % nasal spray Place 2 sprays into both nostrils 2 (two) times daily.  05/30/22   Christopher Savannah, PA-C  LANTUS  SOLOSTAR 100 UNIT/ML Solostar Pen ADMINISTER 13 UNITS UNDER THE SKIN AT BEDTIME 07/06/21   Lorren Greig PARAS, NP  levocetirizine (XYZAL ) 5 MG tablet Take 1 tablet (5 mg total) by mouth every evening. 05/30/22   Christopher Savannah, PA-C  loperamide  (IMODIUM ) 2 MG capsule Take 1 capsule (2 mg total) by mouth 2 (two) times daily as needed for diarrhea or loose stools. 08/22/22   Christopher Savannah, PA-C  meloxicam  (MOBIC ) 7.5 MG tablet Take 1 tablet (7.5 mg total) by mouth daily. 12/06/22   Desiderio Chew, PA-C  metFORMIN  (GLUCOPHAGE -XR) 500 MG 24 hr tablet TAKE 2 TABLETS(1000 MG) BY MOUTH TWICE DAILY WITH A MEAL 09/07/21   Lorren Greig PARAS, NP  omeprazole  (PRILOSEC ) 40 MG capsule Take 40 mg by mouth daily.    [provider]  ondansetron  (ZOFRAN -ODT) 4 MG disintegrating tablet Take 1 tablet (4 mg total) by mouth every 8 (eight) hours as needed for nausea or vomiting. 12/15/22   Mayer, Jodi R, NP  pseudoephedrine  (SUDAFED) 30 MG tablet Take 1 tablet (30 mg total) by mouth every 8 (eight) hours as needed for congestion. 05/30/22   Christopher Savannah, PA-C  sucralfate  (CARAFATE ) 1 g tablet Take 1 tablet (1 g total) by mouth 4 (four) times daily -  with meals and at bedtime for 7 days. 10/25/21 11/01/21  Elnor Jayson LABOR, DO  TRUEplus  Lancets 28G MISC Use as directed 11/10/20   Lorren Greig PARAS, NP  pantoprazole  (PROTONIX ) 20 MG tablet Take 1 tablet (20 mg total) by mouth daily. 05/26/17 06/27/20  Lenor Hollering, MD    Allergies: Diclofenac , Vancomycin , and Adhesive [tape]    Review of Systems  HENT:  Positive for dental problem.   All other systems reviewed and are negative.   Updated Vital Signs BP (!) 121/57 (BP Location: Right Arm)   Pulse 92   Temp 98.3 F (36.8 C) (Oral)   Resp 18   SpO2 93%   Physical Exam Vitals and nursing note reviewed.  Constitutional:      General: She is not in acute distress.    Appearance: Normal appearance. She is not ill-appearing or diaphoretic.   HENT:     Head: Normocephalic and atraumatic.     Mouth/Throat:     Mouth: Mucous membranes are moist.     Pharynx: Oropharynx is clear. No oropharyngeal exudate or posterior oropharyngeal erythema.     Comments: Patient noted to have tooth 1 broken nearly in half with lateral edge able to move freely.  No signs of infection. Eyes:     General: No scleral icterus.       Right eye: No discharge.        Left eye: No discharge.     Extraocular Movements: Extraocular movements intact.     Conjunctiva/sclera: Conjunctivae normal.  Cardiovascular:     Rate and Rhythm: Normal rate and regular rhythm.     Pulses: Normal pulses.     Heart sounds: Normal heart sounds. No murmur heard.    No friction rub. No gallop.  Pulmonary:     Effort: Pulmonary effort is normal. No respiratory distress.     Breath sounds: No stridor. No wheezing, rhonchi or rales.  Chest:     Chest wall: No tenderness.  Abdominal:     General: Abdomen is flat. There is no distension.     Palpations: Abdomen is soft.     Tenderness: There is no abdominal tenderness. There is no right CVA tenderness, left CVA tenderness, guarding or rebound.  Musculoskeletal:        General: No swelling, deformity or signs of injury.     Cervical back: Normal range of motion. No rigidity.     Right lower leg: No edema.     Left lower leg: No edema.  Skin:    General: Skin is warm and dry.     Findings: No bruising, erythema or lesion.  Neurological:     General: No focal deficit present.     Mental Status: She is alert and oriented to person, place, and time. Mental status is at baseline.     Sensory: No sensory deficit.     Motor: No weakness.  Psychiatric:        Mood and Affect: Mood normal.     (all labs ordered are listed, but only abnormal results are displayed) Labs Reviewed - No data to display  EKG: None  Radiology: No results found.   Procedures   Medications Ordered in the ED - No data to display                                Medical Decision Making  This patient is a 53 year old female who presents to the ED for concern of dental fracture to tooth 1 noting to have happened after  she ate a new or strip steak and felt a crack in her tooth.  On physical exam, patient is in no acute distress, afebrile, alert and orient x 4, speaking in full sentences, nontachypneic, nontachycardic.  Notably does have tooth 1 fractured in half with lateral edge able to be moved laterally.  Mild pain however no signs of infection.  With patient's presentation, would have her still follow-up with dentistry as well as taking Tylenol  with pain medication.  Low suspicion for any other emergent conditions including low suspicion for Ludwig's angina, peritonsillar abscess.   Follow-up with dentistry.  Patient vital signs have remained stable throughout the course of patient's time in the ED. Low suspicion for any other emergent pathology at this time. I believe this patient is safe to be discharged. Provided strict return to ER precautions. Patient expressed agreement and understanding of plan. All questions were answered.  Differential diagnoses prior to evaluation: The emergent differential diagnosis includes, but is not limited to, tooth fracture, dental subluxation, dental avulsion, gingivitis this is not an exhaustive differential.   Past Medical History / Co-morbidities / Social History: Cellulitis, diabetes, GERD  Additional history: Chart reviewed. Pertinent results include:   Noted to have 7 ED trips in the last 6 months.    Medications:  I have reviewed the patients home medicines and have made adjustments as needed.  Critical Interventions: None  Social Determinants of Health: Does not currently have a dentist at this time  Disposition: After consideration of the diagnostic results and the patients response to treatment, I feel that the patient would benefit from discharge and shortness above.    emergency department workup does not suggest an emergent condition requiring admission or immediate intervention beyond what has been performed at this time. The plan is: Discharge and follow-up with dentistry for further evaluation. The patient is safe for discharge and has been instructed to return immediately for worsening symptoms, change in symptoms or any other concerns.    Final diagnoses:  Closed fracture of tooth, initial encounter    ED Discharge Orders     None          Dmitri Pettigrew S, PA-C 12/18/23 2100    Simon Lavonia SAILOR, MD 12/18/23 2252

## 2023-12-18 NOTE — Discharge Instructions (Addendum)
 You were seen today for dental fracture.  You will need to follow-up with a dentist for further management at this time.  Please be sure to avoid eating on that side as well as ensuring to eat soft foods until seen by dentist.  Please use Tylenol  for as pain medication at this time.  Take Tylenol  (acetominophen)  650mg  every 4-6 hours, as needed for pain or fever. Do not take more than 4,000 mg in a 24-hour period. As this may cause liver damage. While this is rare, if you begin to develop yellowing of the skin or eyes, stop taking and return to ER immediately.  I have attached a list of resources for dentist to go and see.

## 2023-12-18 NOTE — ED Triage Notes (Signed)
 Pt c/o dental pain to top right tooth after biting a piece of steak. Pt endorses pain.

## 2024-02-17 ENCOUNTER — Other Ambulatory Visit: Payer: Self-pay

## 2024-02-17 ENCOUNTER — Emergency Department (HOSPITAL_COMMUNITY)

## 2024-02-17 ENCOUNTER — Emergency Department (HOSPITAL_COMMUNITY)
Admission: EM | Admit: 2024-02-17 | Discharge: 2024-02-18 | Disposition: A | Discharge status: Home / Self Care | Attending: Emergency Medicine | Admitting: Emergency Medicine

## 2024-02-17 ENCOUNTER — Encounter (HOSPITAL_COMMUNITY): Payer: Self-pay | Admitting: Emergency Medicine

## 2024-02-17 DIAGNOSIS — R059 Cough, unspecified: Secondary | ICD-10-CM | POA: Diagnosis present

## 2024-02-17 DIAGNOSIS — R197 Diarrhea, unspecified: Secondary | ICD-10-CM | POA: Diagnosis not present

## 2024-02-17 DIAGNOSIS — Z794 Long term (current) use of insulin: Secondary | ICD-10-CM | POA: Diagnosis not present

## 2024-02-17 DIAGNOSIS — R61 Generalized hyperhidrosis: Secondary | ICD-10-CM | POA: Insufficient documentation

## 2024-02-17 DIAGNOSIS — R509 Fever, unspecified: Secondary | ICD-10-CM | POA: Diagnosis not present

## 2024-02-17 DIAGNOSIS — R112 Nausea with vomiting, unspecified: Secondary | ICD-10-CM | POA: Insufficient documentation

## 2024-02-17 DIAGNOSIS — M791 Myalgia, unspecified site: Secondary | ICD-10-CM | POA: Diagnosis not present

## 2024-02-17 DIAGNOSIS — J111 Influenza due to unidentified influenza virus with other respiratory manifestations: Secondary | ICD-10-CM

## 2024-02-17 LAB — RESP PANEL BY RT-PCR (RSV, FLU A&B, COVID)  RVPGX2
Influenza A by PCR: NEGATIVE
Influenza B by PCR: NEGATIVE
Resp Syncytial Virus by PCR: NEGATIVE
SARS Coronavirus 2 by RT PCR: NEGATIVE

## 2024-02-17 MED ORDER — ONDANSETRON 4 MG PO TBDP
4.0000 mg | ORAL_TABLET | Freq: Once | ORAL | Status: AC
  Administered 2024-02-17: 4 mg via ORAL
  Filled 2024-02-17: qty 1

## 2024-02-17 MED ORDER — ALBUTEROL SULFATE HFA 108 (90 BASE) MCG/ACT IN AERS: 2.0000 | INHALATION_SPRAY | RESPIRATORY_TRACT | 2 refills | Status: AC | PRN

## 2024-02-17 MED ORDER — LOPERAMIDE HCL 2 MG PO CAPS: 2.0000 mg | ORAL_CAPSULE | Freq: Four times a day (QID) | ORAL | 0 refills | Status: AC | PRN

## 2024-02-17 MED ORDER — ONDANSETRON 4 MG PO TBDP: ORAL_TABLET | ORAL | 0 refills | Status: AC

## 2024-02-17 NOTE — ED Provider Notes (Signed)
 [place] EMERGENCY DEPARTMENT AT [facility] Provider Note   CSN: [id number] Arrival date & time: 02/17/24  2139     Patient presents with: URI   [name] is a 53 y.o. female.  {Add pertinent medical, surgical, social history, OB history to YEP:67052} Presents to the emergency department for evaluation of flulike illness.  Patient reports that she has been having intermittent sweats and cold chills associated with cough, chest congestion, nausea, vomiting and diarrhea.  Symptoms have been present for 2 days.  He reports I feel like I have the flu.       Prior to Admission medications   Medication Sig Start Date End Date Taking? Authorizing Provider  acetaminophen  (TYLENOL ) 325 MG tablet Take 2 tablets (650 mg total) by mouth every 6 (six) hours as needed for moderate pain. 05/30/22   Edras Wilford Savannah, PA-C  benzonatate  (TESSALON ) 200 MG capsule Take 1 capsule (200 mg total) by mouth 3 (three) times daily as needed. 12/15/22   Mayer, Jodi R, NP  Blood Glucose Monitoring Suppl (TRUE METRIX METER) w/Device KIT Use as directed 11/10/20   Lorren Greig PARAS, NP  gabapentin  (NEURONTIN ) 100 MG capsule Take 1 capsule (100 mg total) by mouth at bedtime. 01/15/21 02/14/21  Lorren Greig PARAS, NP  glimepiride  (AMARYL ) 1 MG tablet TAKE 1 TABLET(1 MG) BY MOUTH TWICE DAILY WITH A MEAL 09/07/21   Lorren Greig PARAS, NP  glucose blood (TRUE METRIX BLOOD GLUCOSE TEST) test strip Use as instructed 11/10/20   Lorren Greig PARAS, NP  HYDROcodone -acetaminophen  (NORCO/VICODIN) 5-325 MG tablet Take one tab po q 4 hrs prn pain 10/25/23   Triplett, Tammy, PA-C  insulin  NPH-regular Human (NOVOLIN 70/30) (70-30) 100 UNIT/ML injection Inject into the skin. 01/22/19   [provider]  Insulin  Pen Needle (PEN NEEDLES) 31G X 8 MM MISC UAD 03/01/21   Lorren Greig J, NP  ipratropium (ATROVENT ) 0.03 % nasal spray Place 2 sprays into both nostrils 2 (two) times daily. 05/30/22   Vannessa Godown Savannah, PA-C  LANTUS   SOLOSTAR 100 UNIT/ML Solostar Pen ADMINISTER 13 UNITS UNDER THE SKIN AT BEDTIME 07/06/21   Lorren Greig PARAS, NP  levocetirizine (XYZAL ) 5 MG tablet Take 1 tablet (5 mg total) by mouth every evening. 05/30/22   Heywood Tokunaga Savannah, PA-C  loperamide  (IMODIUM ) 2 MG capsule Take 1 capsule (2 mg total) by mouth 2 (two) times daily as needed for diarrhea or loose stools. 08/22/22   Leyli Kevorkian Savannah, PA-C  meloxicam  (MOBIC ) 7.5 MG tablet Take 1 tablet (7.5 mg total) by mouth daily. 12/06/22   Desiderio Chew, PA-C  metFORMIN  (GLUCOPHAGE -XR) 500 MG 24 hr tablet TAKE 2 TABLETS(1000 MG) BY MOUTH TWICE DAILY WITH A MEAL 09/07/21   Lorren Greig PARAS, NP  omeprazole  (PRILOSEC ) 40 MG capsule Take 40 mg by mouth daily.    [provider]  ondansetron  (ZOFRAN -ODT) 4 MG disintegrating tablet Take 1 tablet (4 mg total) by mouth every 8 (eight) hours as needed for nausea or vomiting. 12/15/22   Mayer, Jodi R, NP  pseudoephedrine  (SUDAFED) 30 MG tablet Take 1 tablet (30 mg total) by mouth every 8 (eight) hours as needed for congestion. 05/30/22   Shekera Beavers Savannah, PA-C  sucralfate  (CARAFATE ) 1 g tablet Take 1 tablet (1 g total) by mouth 4 (four) times daily -  with meals and at bedtime for 7 days. 10/25/21 11/01/21  Elnor Jayson LABOR, DO  TRUEplus Lancets 28G MISC Use as directed 11/10/20   Lorren Greig PARAS, NP  pantoprazole  (  PROTONIX ) 20 MG tablet Take 1 tablet (20 mg total) by mouth daily. 05/26/17 06/27/20  Lenor Hollering, MD    Allergies: Diclofenac , Vancomycin , and Adhesive [tape]    Review of Systems  Updated Vital Signs BP (!) 107/54   Pulse 90   Temp 98.4 F (36.9 C) (Oral)   Resp 16   Ht 5' 1 (1.549 m)   Wt 126.6 kg   LMP  (Approximate)   SpO2 94%   BMI 52.75 kg/m   Physical Exam Vitals and nursing note reviewed.  Constitutional:      General: She is not in acute distress.    Appearance: She is well-developed.  HENT:     Head: Normocephalic and atraumatic.     Mouth/Throat:     Mouth: Mucous membranes are moist.   Eyes:     General: Vision grossly intact. Gaze aligned appropriately.     Extraocular Movements: Extraocular movements intact.     Conjunctiva/sclera: Conjunctivae normal.  Cardiovascular:     Rate and Rhythm: Normal rate and regular rhythm.     Pulses: Normal pulses.     Heart sounds: Normal heart sounds, S1 normal and S2 normal. No murmur heard.    No friction rub. No gallop.  Pulmonary:     Effort: Pulmonary effort is normal. No respiratory distress.     Breath sounds: Normal breath sounds.  Abdominal:     General: Bowel sounds are normal.     Palpations: Abdomen is soft.     Tenderness: There is no abdominal tenderness. There is no guarding or rebound.     Hernia: No hernia is present.  Musculoskeletal:        General: No swelling.     Cervical back: Full passive range of motion without pain, normal range of motion and neck supple. No spinous process tenderness or muscular tenderness. Normal range of motion.     Right lower leg: No edema.     Left lower leg: No edema.  Skin:    General: Skin is warm and dry.     Capillary Refill: Capillary refill takes less than 2 seconds.     Findings: No ecchymosis, erythema, rash or wound.  Neurological:     General: No focal deficit present.     Mental Status: She is alert and oriented to person, place, and time.     GCS: GCS eye subscore is 4. GCS verbal subscore is 5. GCS motor subscore is 6.     Cranial Nerves: Cranial nerves 2-12 are intact.     Sensory: Sensation is intact.     Motor: Motor function is intact.     Coordination: Coordination is intact.  Psychiatric:        Attention and Perception: Attention normal.        Mood and Affect: Mood normal.        Speech: Speech normal.        Behavior: Behavior normal.     (all labs ordered are listed, but only abnormal results are displayed) Labs Reviewed  RESP PANEL BY RT-PCR (RSV, FLU A&B, COVID)  [id number]    EKG: None  Radiology: DG Chest 2 View Result Date:  02/17/2024 CLINICAL DATA:  Head and chest congestion. EXAM: DG CHEST 2V COMPARISON:  June 02, 2022 FINDINGS: The heart size and mediastinal contours are within normal limits. Both lungs are clear. The visualized skeletal structures are unremarkable. IMPRESSION: No active cardiopulmonary disease. Electronically Signed   By: Suzen Dwane HERO.D.  On: 02/17/2024 22:29    {Document cardiac monitor, telemetry assessment procedure when appropriate:32947} Procedures   Medications Ordered in the ED  ondansetron  (ZOFRAN -ODT) disintegrating tablet 4 mg (has no administration in time range)      {Click here for ABCD2, HEART and other calculators REFRESH Note before signing:1}                              Medical Decision Making Amount and/or Complexity of Data Reviewed Labs: ordered. Decision-making details documented in ED Course. Radiology: ordered and independent interpretation performed. Decision-making details documented in ED Course. ECG/medicine tests: ordered and independent interpretation performed. Decision-making details documented in ED Course.  Risk Prescription drug management.  Differential Diagnosis considered includes, but not limited to: COVID-19; influenza; RSV; simple viral URI; strep pharyngitis; pneumonia  Patient presents with flulike illness.  She appears well.  She is afebrile with normal vital signs.  She reports constellation of symptoms including fever, chills, myalgias, cough, pain with coughing, nausea, vomiting and diarrhea.  Influenza, COVID, RSV are negative.  Chest x-ray clear, no evidence of pneumonia.  Patient appears well otherwise, does not require additional workup at this time, treat for viral illness.  {Document critical care time when appropriate  Document review of labs and clinical decision tools ie CHADS2VASC2, etc  Document your independent review of radiology images and any outside records  Document your discussion with family members,  caretakers and with consultants  Document social determinants of health affecting pt's care  Document your decision making why or why not admission, treatments were needed:32947:::1}   Final diagnoses:  None    ED Discharge Orders     None

## 2024-02-17 NOTE — ED Notes (Signed)
 Pt verbally agreed to [facility] waiver. Signature pad currently not working

## 2024-02-17 NOTE — ED Triage Notes (Signed)
 Pov c/o of head and chest congestion. Endores chills, congestion, n/v/d, body aches, headaches, loss of taste. Symptoms started Thursday.

## 2024-02-18 NOTE — ED Notes (Signed)
 This RN noted the pts BP and went into the room to check to see if the BP was that number and the pts BP monitor was not working properly after several attempts multiple different ways pt was ready to leave.  Pt left the ER with normal gait and no complaints.

## 2024-04-06 ENCOUNTER — Other Ambulatory Visit: Payer: Self-pay

## 2024-04-06 ENCOUNTER — Emergency Department (HOSPITAL_COMMUNITY)
Admission: EM | Admit: 2024-04-06 | Discharge: 2024-04-07 | Disposition: A | Discharge status: Home / Self Care | Attending: Emergency Medicine | Admitting: Emergency Medicine

## 2024-04-06 ENCOUNTER — Encounter (HOSPITAL_COMMUNITY): Payer: Self-pay

## 2024-04-06 DIAGNOSIS — M79644 Pain in right finger(s): Secondary | ICD-10-CM | POA: Diagnosis present

## 2024-04-06 DIAGNOSIS — Z794 Long term (current) use of insulin: Secondary | ICD-10-CM | POA: Diagnosis not present

## 2024-04-06 DIAGNOSIS — R202 Paresthesia of skin: Secondary | ICD-10-CM | POA: Insufficient documentation

## 2024-04-06 DIAGNOSIS — M79641 Pain in right hand: Secondary | ICD-10-CM | POA: Insufficient documentation

## 2024-04-06 DIAGNOSIS — E119 Type 2 diabetes mellitus without complications: Secondary | ICD-10-CM | POA: Insufficient documentation

## 2024-04-06 DIAGNOSIS — Z7984 Long term (current) use of oral hypoglycemic drugs: Secondary | ICD-10-CM | POA: Insufficient documentation

## 2024-04-06 NOTE — ED Triage Notes (Signed)
 Pt reports right hand pain x 3 weeks with some numbness to her fingers.

## 2024-04-07 ENCOUNTER — Emergency Department (HOSPITAL_COMMUNITY)

## 2024-04-07 MED ORDER — KETOROLAC TROMETHAMINE 60 MG/2ML IM SOLN
30.0000 mg | Freq: Once | INTRAMUSCULAR | Status: AC
  Administered 2024-04-07: 30 mg via INTRAMUSCULAR
  Filled 2024-04-07: qty 2

## 2024-04-07 NOTE — ED Provider Notes (Signed)
 " [place] EMERGENCY DEPARTMENT AT [facility] Provider Note   CSN: [id number] Arrival date & time: 04/06/24  2251     Patient presents with: Hand Pain   [name] is a 53 y.o. female.    Hand Pain  Patient presenting for pain in second digit of right hand in addition to paresthesias in all fingers of right hand.  This has been ongoing for the past several weeks.  She is left-handed.  Patient denies any pain in other digits besides second.  She does have pain in second MCP as well.  She takes Tylenol  for analgesia at home.  She denies any prior injury.     Prior to Admission medications  Medication Sig Start Date End Date Taking? Authorizing Provider  acetaminophen  (TYLENOL ) 325 MG tablet Take 2 tablets (650 mg total) by mouth every 6 (six) hours as needed for moderate pain. 05/30/22   Christopher Savannah, PA-C  albuterol  (VENTOLIN  HFA) 108 (90 Base) MCG/ACT inhaler Inhale 2 puffs into the lungs every 4 (four) hours as needed for wheezing or shortness of breath. 02/17/24   Haze Lonni PARAS, MD  Blood Glucose Monitoring Suppl (TRUE METRIX METER) w/Device KIT Use as directed 11/10/20   Jaycee Greig PARAS, NP  gabapentin  (NEURONTIN ) 100 MG capsule Take 1 capsule (100 mg total) by mouth at bedtime. 01/15/21 02/14/21  Jaycee Greig PARAS, NP  glimepiride  (AMARYL ) 1 MG tablet TAKE 1 TABLET(1 MG) BY MOUTH TWICE DAILY WITH A MEAL 09/07/21   Jaycee Greig PARAS, NP  glucose blood (TRUE METRIX BLOOD GLUCOSE TEST) test strip Use as instructed 11/10/20   Jaycee Greig PARAS, NP  insulin  NPH-regular Human (NOVOLIN 70/30) (70-30) 100 UNIT/ML injection Inject into the skin. 01/22/19   [provider]  Insulin  Pen Needle (PEN NEEDLES) 31G X 8 MM MISC UAD 03/01/21   Jaycee, Amy J, NP  ipratropium (ATROVENT ) 0.03 % nasal spray Place 2 sprays into both nostrils 2 (two) times daily. 05/30/22   Christopher Savannah, PA-C  LANTUS  SOLOSTAR 100 UNIT/ML Solostar Pen ADMINISTER 13 UNITS UNDER THE SKIN AT BEDTIME 07/06/21    Jaycee Greig PARAS, NP  levocetirizine (XYZAL ) 5 MG tablet Take 1 tablet (5 mg total) by mouth every evening. 05/30/22   Christopher Savannah, PA-C  loperamide  (IMODIUM ) 2 MG capsule Take 1 capsule (2 mg total) by mouth 4 (four) times daily as needed for diarrhea or loose stools. 02/17/24   Haze Lonni PARAS, MD  meloxicam  (MOBIC ) 7.5 MG tablet Take 1 tablet (7.5 mg total) by mouth daily. 12/06/22   Geiple, Joshua, PA-C  metFORMIN  (GLUCOPHAGE -XR) 500 MG 24 hr tablet TAKE 2 TABLETS(1000 MG) BY MOUTH TWICE DAILY WITH A MEAL 09/07/21   Jaycee Greig PARAS, NP  omeprazole  (PRILOSEC ) 40 MG capsule Take 40 mg by mouth daily.    [provider]  ondansetron  (ZOFRAN -ODT) 4 MG disintegrating tablet 4mg  ODT q4 hours prn nausea/vomit 02/17/24   Pollina, Lonni PARAS, MD  pseudoephedrine  (SUDAFED) 30 MG tablet Take 1 tablet (30 mg total) by mouth every 8 (eight) hours as needed for congestion. 05/30/22   Christopher Savannah, PA-C  sucralfate  (CARAFATE ) 1 g tablet Take 1 tablet (1 g total) by mouth 4 (four) times daily -  with meals and at bedtime for 7 days. 10/25/21 11/01/21  Elnor Jayson LABOR, DO  TRUEplus Lancets 28G MISC Use as directed 11/10/20   Jaycee Greig PARAS, NP  pantoprazole  (PROTONIX ) 20 MG tablet Take 1 tablet (20 mg total) by mouth daily.  05/26/17 06/27/20  Lenor Hollering, MD    Allergies: Diclofenac , Vancomycin , and Adhesive [tape]    Review of Systems  Musculoskeletal:  Positive for arthralgias.  All other systems reviewed and are negative.   Updated Vital Signs BP (!) 127/55   Pulse 92   Temp 98.2 F (36.8 C)   Resp 14   Wt 123.8 kg   SpO2 93%   BMI 51.58 kg/m   Physical Exam Vitals and nursing note reviewed.  Constitutional:      General: She is not in acute distress.    Appearance: Normal appearance. She is well-developed. She is not ill-appearing, toxic-appearing or diaphoretic.  HENT:     Head: Normocephalic and atraumatic.     Right Ear: External ear normal.     Left Ear: External ear normal.      Nose: Nose normal.     Mouth/Throat:     Mouth: Mucous membranes are moist.  Eyes:     Extraocular Movements: Extraocular movements intact.     Conjunctiva/sclera: Conjunctivae normal.  Cardiovascular:     Rate and Rhythm: Normal rate and regular rhythm.  Pulmonary:     Effort: Pulmonary effort is normal. No respiratory distress.  Abdominal:     General: There is no distension.     Palpations: Abdomen is soft.  Musculoskeletal:        General: Tenderness present. No swelling or deformity.     Cervical back: Normal range of motion and neck supple.  Skin:    General: Skin is warm and dry.     Coloration: Skin is not jaundiced or pale.  Neurological:     General: No focal deficit present.     Mental Status: She is alert and oriented to person, place, and time.  Psychiatric:        Mood and Affect: Mood normal.        Behavior: Behavior normal.     (all labs ordered are listed, but only abnormal results are displayed) Labs Reviewed - No data to display  EKG: None  Radiology: DG Hand 2 View Right Result Date: 04/07/2024 EXAM: 1 OR 2 VIEW(S) XRAY OF THE RIGHT HAND 04/07/2024 12:51:40 AM COMPARISON: None available. CLINICAL HISTORY: Right hand pain FINDINGS: BONES AND JOINTS: No acute fracture. SOFT TISSUES: The soft tissues are unremarkable. IMPRESSION: 1. No acute findings. Electronically signed by: Oneil Devonshire MD 04/07/2024 12:58 AM EST RP Workstation: [id number]     Procedures   Medications Ordered in the ED  ketorolac  (TORADOL ) injection 30 mg (30 mg Intramuscular Given 04/07/24 0051)                                    Medical Decision Making Amount and/or Complexity of Data Reviewed Radiology: ordered.  Risk Prescription drug management.   Patient presenting for several weeks of ongoing pain in second digit of right hand.  On arrival in the ED, vital signs are normal.  Patient is well-appearing on exam.  It is difficult to appreciate any swelling to the second  digit.  She does have tenderness, primarily in the palmar aspect of MCP.  There is no warmth or overlying skin change.  She has good range of motion in all digits of her right hand.  Patient also describes a tingling sensation in all fingertips of right hand.  Distribution of described paresthesias is not consistent with carpal tunnel syndrome.  Patient may have  a neuropathy.  She does have underlying diabetes.  She has been prescribed gabapentin  in the past for back pain.  She is not currently taking this.  Dose of Toradol  was ordered.  X-ray imaging did not show any acute findings.  Patient was given AlumaFoam splint.  She was advised to follow-up with hand surgeon.  She was discharged in stable condition.     Final diagnoses:  Right hand pain    ED Discharge Orders     None          Melvenia Motto, MD 04/07/24 0127  "

## 2024-04-07 NOTE — ED Notes (Signed)
 Pt was Aox4 for this RN upon assessment.

## 2024-04-07 NOTE — Discharge Instructions (Signed)
 Follow-up with hand doctor.  You can call the telephone number below or call your [place] office where you have been seen before.  Take ibuprofen  and Tylenol  as needed for pain.

## 2024-05-05 ENCOUNTER — Other Ambulatory Visit: Payer: Self-pay

## 2024-05-05 ENCOUNTER — Emergency Department (HOSPITAL_COMMUNITY)
Admission: EM | Admit: 2024-05-05 | Discharge: 2024-05-06 | Disposition: A | Attending: Emergency Medicine | Admitting: Emergency Medicine

## 2024-05-05 ENCOUNTER — Encounter (HOSPITAL_COMMUNITY): Payer: Self-pay | Admitting: Emergency Medicine

## 2024-05-05 DIAGNOSIS — Z7984 Long term (current) use of oral hypoglycemic drugs: Secondary | ICD-10-CM | POA: Diagnosis not present

## 2024-05-05 DIAGNOSIS — R1084 Generalized abdominal pain: Secondary | ICD-10-CM | POA: Diagnosis present

## 2024-05-05 DIAGNOSIS — E119 Type 2 diabetes mellitus without complications: Secondary | ICD-10-CM | POA: Diagnosis not present

## 2024-05-05 LAB — CBC WITH DIFFERENTIAL/PLATELET
Abs Immature Granulocytes: 0.04 K/uL (ref 0.00–0.07)
Basophils Absolute: 0.1 K/uL (ref 0.0–0.1)
Basophils Relative: 0 %
Eosinophils Absolute: 0.3 K/uL (ref 0.0–0.5)
Eosinophils Relative: 2 %
HCT: 42 % (ref 36.0–46.0)
Hemoglobin: 13.8 g/dL (ref 12.0–15.0)
Immature Granulocytes: 0 %
Lymphocytes Relative: 33 %
Lymphs Abs: 4.5 K/uL — ABNORMAL HIGH (ref 0.7–4.0)
MCH: 28 pg (ref 26.0–34.0)
MCHC: 32.9 g/dL (ref 30.0–36.0)
MCV: 85.4 fL (ref 80.0–100.0)
Monocytes Absolute: 0.9 K/uL (ref 0.1–1.0)
Monocytes Relative: 7 %
Neutro Abs: 7.9 K/uL — ABNORMAL HIGH (ref 1.7–7.7)
Neutrophils Relative %: 58 %
Platelets: 365 K/uL (ref 150–400)
RBC: 4.92 MIL/uL (ref 3.87–5.11)
RDW: 13.6 % (ref 11.5–15.5)
WBC: 13.8 K/uL — ABNORMAL HIGH (ref 4.0–10.5)
nRBC: 0 % (ref 0.0–0.2)

## 2024-05-05 LAB — COMPREHENSIVE METABOLIC PANEL WITH GFR
ALT: 25 U/L (ref 0–44)
AST: 24 U/L (ref 15–41)
Albumin: 3.8 g/dL (ref 3.5–5.0)
Alkaline Phosphatase: 157 U/L — ABNORMAL HIGH (ref 38–126)
Anion gap: 10 (ref 5–15)
BUN: 17 mg/dL (ref 6–20)
CO2: 28 mmol/L (ref 22–32)
Calcium: 9.1 mg/dL (ref 8.9–10.3)
Chloride: 100 mmol/L (ref 98–111)
Creatinine, Ser: 0.75 mg/dL (ref 0.44–1.00)
GFR, Estimated: >60 mL/min
Glucose, Bld: 379 mg/dL — ABNORMAL HIGH (ref 70–99)
Potassium: 4.1 mmol/L (ref 3.5–5.1)
Sodium: 138 mmol/L (ref 135–145)
Total Bilirubin: 0.3 mg/dL (ref 0.0–1.2)
Total Protein: 7 g/dL (ref 6.5–8.1)

## 2024-05-05 LAB — URINALYSIS, ROUTINE W REFLEX MICROSCOPIC
Bacteria, UA: NONE SEEN
Bilirubin Urine: NEGATIVE
Glucose, UA: >=500 mg/dL — AB
Hgb urine dipstick: NEGATIVE
Ketones, ur: NEGATIVE mg/dL
Nitrite: NEGATIVE
Protein, ur: 30 mg/dL — AB
Specific Gravity, Urine: 1.025 (ref 1.005–1.030)
pH: 5 (ref 5.0–8.0)

## 2024-05-05 LAB — LIPASE, BLOOD: Lipase: 70 U/L — ABNORMAL HIGH (ref 11–51)

## 2024-05-05 LAB — MAGNESIUM: Magnesium: 1.8 mg/dL (ref 1.7–2.4)

## 2024-05-05 MED ORDER — KETOROLAC TROMETHAMINE 15 MG/ML IJ SOLN
15.0000 mg | Freq: Once | INTRAMUSCULAR | Status: AC
  Administered 2024-05-05: 15 mg via INTRAVENOUS
  Filled 2024-05-05: qty 1

## 2024-05-05 MED ORDER — LACTATED RINGERS IV BOLUS
1000.0000 mL | Freq: Once | INTRAVENOUS | Status: AC
  Administered 2024-05-05: 1000 mL via INTRAVENOUS

## 2024-05-05 MED ORDER — METHOCARBAMOL 500 MG PO TABS
1000.0000 mg | ORAL_TABLET | Freq: Once | ORAL | Status: AC
  Administered 2024-05-05: 1000 mg via ORAL
  Filled 2024-05-05: qty 2

## 2024-05-05 NOTE — ED Provider Notes (Signed)
 " [place] EMERGENCY DEPARTMENT AT [facility] Provider Note   CSN: [id number] Arrival date & time: 05/05/24  2238     Patient presents with: Abdominal Pain   [name] is a 54 y.o. female.  {Add pertinent medical, surgical, social history, OB history to YEP:67052}  Abdominal Pain Patient presents for abdominal pain.  Medical history includes DM, GERD.  Earlier today, patient was in her normal state of health.  At around 7 PM, she had dinner.  Shortly thereafter, she developed a generalized abdominal pain.  She describes it as having multiple characteristics, including burning, aching, sharp.  It seems to be slightly worse in the lower aspect bilaterally.  She denies any associated nausea or any other associated symptoms.  She has not taken anything at home for the pain.     Prior to Admission medications  Medication Sig Start Date End Date Taking? Authorizing Provider  acetaminophen  (TYLENOL ) 325 MG tablet Take 2 tablets (650 mg total) by mouth every 6 (six) hours as needed for moderate pain. 05/30/22   Christopher Savannah, PA-C  albuterol  (VENTOLIN  HFA) 108 (90 Base) MCG/ACT inhaler Inhale 2 puffs into the lungs every 4 (four) hours as needed for wheezing or shortness of breath. 02/17/24   Haze Lonni PARAS, MD  Blood Glucose Monitoring Suppl (TRUE METRIX METER) w/Device KIT Use as directed 11/10/20   Jaycee Greig PARAS, NP  gabapentin  (NEURONTIN ) 100 MG capsule Take 1 capsule (100 mg total) by mouth at bedtime. 01/15/21 02/14/21  Jaycee Greig PARAS, NP  glimepiride  (AMARYL ) 1 MG tablet TAKE 1 TABLET(1 MG) BY MOUTH TWICE DAILY WITH A MEAL 09/07/21   Jaycee Greig PARAS, NP  glucose blood (TRUE METRIX BLOOD GLUCOSE TEST) test strip Use as instructed 11/10/20   Jaycee Greig PARAS, NP  insulin  NPH-regular Human (NOVOLIN 70/30) (70-30) 100 UNIT/ML injection Inject into the skin. 01/22/19   [provider]  Insulin  Pen Needle (PEN NEEDLES) 31G X 8 MM MISC UAD 03/01/21   Jaycee, Amy J, NP   ipratropium (ATROVENT ) 0.03 % nasal spray Place 2 sprays into both nostrils 2 (two) times daily. 05/30/22   Christopher Savannah, PA-C  LANTUS  SOLOSTAR 100 UNIT/ML Solostar Pen ADMINISTER 13 UNITS UNDER THE SKIN AT BEDTIME 07/06/21   Jaycee Greig PARAS, NP  levocetirizine (XYZAL ) 5 MG tablet Take 1 tablet (5 mg total) by mouth every evening. 05/30/22   Christopher Savannah, PA-C  loperamide  (IMODIUM ) 2 MG capsule Take 1 capsule (2 mg total) by mouth 4 (four) times daily as needed for diarrhea or loose stools. 02/17/24   Haze Lonni PARAS, MD  meloxicam  (MOBIC ) 7.5 MG tablet Take 1 tablet (7.5 mg total) by mouth daily. 12/06/22   Desiderio Chew, PA-C  metFORMIN  (GLUCOPHAGE -XR) 500 MG 24 hr tablet TAKE 2 TABLETS(1000 MG) BY MOUTH TWICE DAILY WITH A MEAL 09/07/21   Jaycee Greig PARAS, NP  omeprazole  (PRILOSEC ) 40 MG capsule Take 40 mg by mouth daily.    [provider]  ondansetron  (ZOFRAN -ODT) 4 MG disintegrating tablet 4mg  ODT q4 hours prn nausea/vomit 02/17/24   Pollina, Lonni PARAS, MD  pseudoephedrine  (SUDAFED) 30 MG tablet Take 1 tablet (30 mg total) by mouth every 8 (eight) hours as needed for congestion. 05/30/22   Christopher Savannah, PA-C  sucralfate  (CARAFATE ) 1 g tablet Take 1 tablet (1 g total) by mouth 4 (four) times daily -  with meals and at bedtime for 7 days. 10/25/21 11/01/21  Elnor Savant A, DO  TRUEplus Lancets 28G MISC  Use as directed 11/10/20   Jaycee Greig PARAS, NP  pantoprazole  (PROTONIX ) 20 MG tablet Take 1 tablet (20 mg total) by mouth daily. 05/26/17 06/27/20  Lenor Hollering, MD    Allergies: Diclofenac , Vancomycin , and Adhesive [tape]    Review of Systems  Gastrointestinal:  Positive for abdominal pain.  All other systems reviewed and are negative.   Updated Vital Signs Ht 5' 1 (1.549 m)   Wt 123.8 kg   BMI 51.57 kg/m   Physical Exam Vitals and nursing note reviewed.  Constitutional:      General: She is not in acute distress.    Appearance: She is well-developed. She is not ill-appearing,  toxic-appearing or diaphoretic.  HENT:     Head: Normocephalic and atraumatic.  Eyes:     Conjunctiva/sclera: Conjunctivae normal.  Cardiovascular:     Rate and Rhythm: Normal rate and regular rhythm.  Pulmonary:     Effort: Pulmonary effort is normal. No respiratory distress.  Abdominal:     Palpations: Abdomen is soft.     Tenderness: There is generalized abdominal tenderness. There is no guarding or rebound.  Musculoskeletal:        General: No swelling.     Cervical back: Neck supple.  Skin:    General: Skin is warm and dry.     Coloration: Skin is not cyanotic, jaundiced or pale.  Neurological:     General: No focal deficit present.     Mental Status: She is alert and oriented to person, place, and time.  Psychiatric:        Mood and Affect: Mood normal.        Behavior: Behavior normal.     (all labs ordered are listed, but only abnormal results are displayed) Labs Reviewed - No data to display  EKG: None  Radiology: No results found.  {Document cardiac monitor, telemetry assessment procedure when appropriate:32947} Procedures   Medications Ordered in the ED - No data to display    {Click here for ABCD2, HEART and other calculators REFRESH Note before signing:1}                              Medical Decision Making  This patient presents to the ED for concern of ***, this involves an extensive number of treatment options, and is a complaint that carries with it a high risk of complications and morbidity.  The differential diagnosis includes ***   Co morbidities / Chronic conditions that complicate the patient evaluation  ***   Additional history obtained:  Additional history obtained from EMR External records from outside source obtained and reviewed including ***   Lab Tests:  I Ordered, and personally interpreted labs.  The pertinent results include:  ***   Imaging Studies ordered:  I ordered imaging studies including ***  I independently  visualized and interpreted imaging which showed *** I agree with the radiologist interpretation   Cardiac Monitoring: / EKG:  The patient was maintained on a cardiac monitor.  I personally viewed and interpreted the cardiac monitored which showed an underlying rhythm of: ***   Problem List / ED Course / Critical interventions / Medication management  Patient presenting for acute onset of generalized abdominal pain, starting this evening.  On arrival in the ED, patient is mildly tachycardic.  Vital signs are otherwise normal.  She is well-appearing on exam.  Her abdomen is soft.  She does have tenderness throughout her abdomen that  does seem to be more prominent in the lower aspect.  Surgical history is notable for cholecystectomy.  She denies any associated nausea.  Multimodal pain control was ordered.  Workup was initiated.*** I ordered medication including ***   Reevaluation of the patient after these medicines showed that the patient *** I have reviewed the patients home medicines and have made adjustments as needed   Consultations Obtained:  I requested consultation with the ***,  and discussed lab and imaging findings as well as pertinent plan - they recommend: ***   Social Determinants of Health:  ***   Test / Admission - Considered:  ***   {Document critical care time when appropriate  Document review of labs and clinical decision tools ie CHADS2VASC2, etc  Document your independent review of radiology images and any outside records  Document your discussion with family members, caretakers and with consultants  Document social determinants of health affecting pt's care  Document your decision making why or why not admission, treatments were needed:32947:::1}   Final diagnoses:  None    ED Discharge Orders     None        "

## 2024-05-05 NOTE — ED Triage Notes (Signed)
 Pt c/o generalized abd pain since eating dinner tonight. She denies any n/v/d.

## 2024-05-06 ENCOUNTER — Emergency Department (HOSPITAL_COMMUNITY)

## 2024-05-06 DIAGNOSIS — R1084 Generalized abdominal pain: Secondary | ICD-10-CM | POA: Diagnosis not present

## 2024-05-06 LAB — CBG MONITORING, ED: Glucose-Capillary: 362 mg/dL — ABNORMAL HIGH (ref 70–99)

## 2024-05-06 MED ORDER — METHOCARBAMOL 500 MG PO TABS: 500.0000 mg | ORAL_TABLET | Freq: Four times a day (QID) | ORAL | 0 refills | Status: AC | PRN

## 2024-05-06 MED ORDER — INSULIN ASPART 100 UNIT/ML IJ SOLN
7.0000 [IU] | Freq: Once | INTRAMUSCULAR | Status: AC
  Administered 2024-05-06: 7 [IU] via SUBCUTANEOUS
  Filled 2024-05-06: qty 1

## 2024-05-06 MED ORDER — IOHEXOL 300 MG/ML  SOLN
100.0000 mL | Freq: Once | INTRAMUSCULAR | Status: AC | PRN
  Administered 2024-05-06: 100 mL via INTRAVENOUS

## 2024-05-06 NOTE — Discharge Instructions (Signed)
 Your test results today were reassuring.  I suspect your discomfort is from muscle cramps in your abdominal wall.  Drink plenty of fluids to stay hydrated.  Take ibuprofen  every 6 hours.  A prescription for a muscle relaxer was sent to your pharmacy.  Take this as needed.  Return to the emergency department for any new or worsening symptoms of concern.
